# Patient Record
Sex: Male | Born: 1935 | Race: Black or African American | Hispanic: No | Marital: Single | Smoking: Former smoker
Health system: Southern US, Community
[De-identification: ages and names within clinical notes are randomized; demographics above are authoritative.]

## PROBLEM LIST (undated history)

## (undated) DIAGNOSIS — I639 Cerebral infarction, unspecified: Secondary | ICD-10-CM

## (undated) DIAGNOSIS — R0609 Other forms of dyspnea: Secondary | ICD-10-CM

## (undated) DIAGNOSIS — I739 Peripheral vascular disease, unspecified: Secondary | ICD-10-CM

## (undated) DIAGNOSIS — H269 Unspecified cataract: Secondary | ICD-10-CM

## (undated) DIAGNOSIS — I10 Essential (primary) hypertension: Secondary | ICD-10-CM

## (undated) DIAGNOSIS — N400 Enlarged prostate without lower urinary tract symptoms: Secondary | ICD-10-CM

## (undated) DIAGNOSIS — R55 Syncope and collapse: Secondary | ICD-10-CM

## (undated) DIAGNOSIS — I340 Nonrheumatic mitral (valve) insufficiency: Secondary | ICD-10-CM

## (undated) HISTORY — DX: Nonrheumatic mitral (valve) insufficiency: I34.0

## (undated) HISTORY — DX: Unspecified cataract: H26.9

## (undated) HISTORY — DX: Essential (primary) hypertension: I10

## (undated) HISTORY — DX: Peripheral vascular disease, unspecified: I73.9

## (undated) HISTORY — PX: PROSTATE SURGERY: SHX751

## (undated) HISTORY — DX: Other forms of dyspnea: R06.09

## (undated) HISTORY — PX: EYE SURGERY: SHX253

## (undated) HISTORY — DX: Benign prostatic hyperplasia without lower urinary tract symptoms: N40.0

---

## 1997-12-30 ENCOUNTER — Ambulatory Visit (HOSPITAL_COMMUNITY): Admission: RE | Admit: 1997-12-30 | Discharge: 1997-12-30 | Payer: Self-pay | Admitting: Specialist

## 1998-03-12 ENCOUNTER — Other Ambulatory Visit: Admission: RE | Admit: 1998-03-12 | Discharge: 1998-03-12 | Payer: Self-pay | Admitting: Urology

## 1999-03-31 ENCOUNTER — Other Ambulatory Visit: Admission: RE | Admit: 1999-03-31 | Discharge: 1999-03-31 | Payer: Self-pay | Admitting: Urology

## 2000-10-29 ENCOUNTER — Encounter (INDEPENDENT_AMBULATORY_CARE_PROVIDER_SITE_OTHER): Payer: Self-pay | Admitting: Specialist

## 2000-10-29 ENCOUNTER — Other Ambulatory Visit: Admission: RE | Admit: 2000-10-29 | Discharge: 2000-10-29 | Payer: Self-pay | Admitting: Urology

## 2006-09-17 ENCOUNTER — Encounter (INDEPENDENT_AMBULATORY_CARE_PROVIDER_SITE_OTHER): Payer: Self-pay | Admitting: Specialist

## 2006-09-17 ENCOUNTER — Inpatient Hospital Stay (HOSPITAL_COMMUNITY): Admission: RE | Admit: 2006-09-17 | Discharge: 2006-09-20 | Payer: Self-pay | Admitting: Urology

## 2009-12-04 ENCOUNTER — Emergency Department (HOSPITAL_COMMUNITY): Admission: EM | Admit: 2009-12-04 | Discharge: 2009-12-04 | Payer: Self-pay | Admitting: Emergency Medicine

## 2010-06-10 ENCOUNTER — Inpatient Hospital Stay (HOSPITAL_COMMUNITY)
Admission: RE | Admit: 2010-06-10 | Discharge: 2010-06-15 | Payer: Self-pay | Source: Home / Self Care | Attending: Internal Medicine | Admitting: Internal Medicine

## 2010-06-10 LAB — BUN: BUN: 14 mg/dL (ref 6–23)

## 2010-06-10 LAB — CREATININE, SERUM
Creatinine, Ser: 1.11 mg/dL (ref 0.4–1.5)
GFR calc Af Amer: >60 mL/min (ref >60)
GFR calc non Af Amer: >60 mL/min (ref >60)

## 2010-06-20 LAB — HEPATIC FUNCTION PANEL
ALT: 94 U/L — ABNORMAL HIGH (ref 0–53)
AST: 40 U/L — ABNORMAL HIGH (ref 0–37)
Albumin: 2.6 g/dL — ABNORMAL LOW (ref 3.5–5.2)
Alkaline Phosphatase: 394 U/L — ABNORMAL HIGH (ref 39–117)
Bilirubin, Direct: 2.2 mg/dL — ABNORMAL HIGH (ref 0.0–0.3)
Indirect Bilirubin: 1.5 mg/dL — ABNORMAL HIGH (ref 0.3–0.9)
Total Bilirubin: 3.7 mg/dL — ABNORMAL HIGH (ref 0.3–1.2)
Total Protein: 6 g/dL (ref 6.0–8.3)

## 2010-06-20 LAB — COMPREHENSIVE METABOLIC PANEL
ALT: 121 U/L — ABNORMAL HIGH (ref 0–53)
ALT: 68 U/L — ABNORMAL HIGH (ref 0–53)
ALT: 53 U/L (ref 0–53)
ALT: 40 U/L (ref 0–53)
AST: 57 U/L — ABNORMAL HIGH (ref 0–37)
AST: 25 U/L (ref 0–37)
AST: 21 U/L (ref 0–37)
AST: 18 U/L (ref 0–37)
Albumin: 2.9 g/dL — ABNORMAL LOW (ref 3.5–5.2)
Albumin: 2.4 g/dL — ABNORMAL LOW (ref 3.5–5.2)
Albumin: 2.3 g/dL — ABNORMAL LOW (ref 3.5–5.2)
Albumin: 2.3 g/dL — ABNORMAL LOW (ref 3.5–5.2)
Alkaline Phosphatase: 434 U/L — ABNORMAL HIGH (ref 39–117)
Alkaline Phosphatase: 312 U/L — ABNORMAL HIGH (ref 39–117)
Alkaline Phosphatase: 297 U/L — ABNORMAL HIGH (ref 39–117)
Alkaline Phosphatase: 257 U/L — ABNORMAL HIGH (ref 39–117)
BUN: 13 mg/dL (ref 6–23)
BUN: 13 mg/dL (ref 6–23)
BUN: 8 mg/dL (ref 6–23)
BUN: 7 mg/dL (ref 6–23)
CO2: 30 mEq/L (ref 19–32)
CO2: 27 mEq/L (ref 19–32)
CO2: 26 mEq/L (ref 19–32)
CO2: 26 mEq/L (ref 19–32)
Calcium: 8.9 mg/dL (ref 8.4–10.5)
Calcium: 8.3 mg/dL — ABNORMAL LOW (ref 8.4–10.5)
Calcium: 8.5 mg/dL (ref 8.4–10.5)
Calcium: 8.6 mg/dL (ref 8.4–10.5)
Chloride: 100 mEq/L (ref 96–112)
Chloride: 107 mEq/L (ref 96–112)
Chloride: 110 mEq/L (ref 96–112)
Chloride: 112 mEq/L (ref 96–112)
Creatinine, Ser: 0.94 mg/dL (ref 0.4–1.5)
Creatinine, Ser: 1.07 mg/dL (ref 0.4–1.5)
Creatinine, Ser: 1.02 mg/dL (ref 0.4–1.5)
Creatinine, Ser: 1.07 mg/dL (ref 0.4–1.5)
GFR calc Af Amer: >60 mL/min (ref >60)
GFR calc Af Amer: >60 mL/min (ref >60)
GFR calc Af Amer: >60 mL/min (ref >60)
GFR calc Af Amer: >60 mL/min (ref >60)
GFR calc non Af Amer: >60 mL/min (ref >60)
GFR calc non Af Amer: >60 mL/min (ref >60)
GFR calc non Af Amer: >60 mL/min (ref >60)
GFR calc non Af Amer: >60 mL/min (ref >60)
Glucose, Bld: 99 mg/dL (ref 70–99)
Glucose, Bld: 82 mg/dL (ref 70–99)
Glucose, Bld: 88 mg/dL (ref 70–99)
Glucose, Bld: 90 mg/dL (ref 70–99)
Potassium: 3.8 mEq/L (ref 3.5–5.1)
Potassium: 3.8 mEq/L (ref 3.5–5.1)
Potassium: 4 mEq/L (ref 3.5–5.1)
Potassium: 4 mEq/L (ref 3.5–5.1)
Sodium: 137 mEq/L (ref 135–145)
Sodium: 139 mEq/L (ref 135–145)
Sodium: 141 mEq/L (ref 135–145)
Sodium: 143 mEq/L (ref 135–145)
Total Bilirubin: 7.1 mg/dL — ABNORMAL HIGH (ref 0.3–1.2)
Total Bilirubin: 2.1 mg/dL — ABNORMAL HIGH (ref 0.3–1.2)
Total Bilirubin: 1.8 mg/dL — ABNORMAL HIGH (ref 0.3–1.2)
Total Bilirubin: 1.4 mg/dL — ABNORMAL HIGH (ref 0.3–1.2)
Total Protein: 6.4 g/dL (ref 6.0–8.3)
Total Protein: 5.8 g/dL — ABNORMAL LOW (ref 6.0–8.3)
Total Protein: 5.9 g/dL — ABNORMAL LOW (ref 6.0–8.3)
Total Protein: 5.8 g/dL — ABNORMAL LOW (ref 6.0–8.3)

## 2010-06-20 LAB — DIFFERENTIAL
Basophils Absolute: 0 10*3/uL (ref 0.0–0.1)
Basophils Absolute: 0 10*3/uL (ref 0.0–0.1)
Basophils Relative: 0 % (ref 0–1)
Basophils Relative: 0 % (ref 0–1)
Eosinophils Absolute: 0 10*3/uL (ref 0.0–0.7)
Eosinophils Absolute: 0 10*3/uL (ref 0.0–0.7)
Eosinophils Relative: 0 % (ref 0–5)
Eosinophils Relative: 0 % (ref 0–5)
Lymphocytes Relative: 12 % (ref 12–46)
Lymphocytes Relative: 15 % (ref 12–46)
Lymphs Abs: 1.2 10*3/uL (ref 0.7–4.0)
Lymphs Abs: 1.1 10*3/uL (ref 0.7–4.0)
Monocytes Absolute: 1.2 10*3/uL — ABNORMAL HIGH (ref 0.1–1.0)
Monocytes Absolute: 1 10*3/uL (ref 0.1–1.0)
Monocytes Relative: 12 % (ref 3–12)
Monocytes Relative: 13 % — ABNORMAL HIGH (ref 3–12)
Neutro Abs: 7.6 10*3/uL (ref 1.7–7.7)
Neutro Abs: 5.6 10*3/uL (ref 1.7–7.7)
Neutrophils Relative %: 76 % (ref 43–77)
Neutrophils Relative %: 72 % (ref 43–77)

## 2010-06-20 LAB — URINALYSIS, ROUTINE W REFLEX MICROSCOPIC
Ketones, ur: NEGATIVE mg/dL
Leukocytes, UA: NEGATIVE
Nitrite: NEGATIVE
Protein, ur: 30 mg/dL — AB
Specific Gravity, Urine: 1.045 — ABNORMAL HIGH (ref 1.005–1.030)
Urine Glucose, Fasting: NEGATIVE mg/dL
Urobilinogen, UA: 1 mg/dL (ref 0.0–1.0)
pH: 7.5 (ref 5.0–8.0)

## 2010-06-20 LAB — LIPID PANEL
Cholesterol: 147 mg/dL (ref 0–200)
HDL: 22 mg/dL — ABNORMAL LOW (ref >39)
LDL Cholesterol: 95 mg/dL (ref 0–99)
Total CHOL/HDL Ratio: 6.7 RATIO
Triglycerides: 148 mg/dL (ref <150)
VLDL: 30 mg/dL (ref 0–40)

## 2010-06-20 LAB — CANCER ANTIGEN 19-9: CA 19-9: 422.5 U/mL — ABNORMAL HIGH (ref <35.0)

## 2010-06-20 LAB — CBC
HCT: 41.3 % (ref 39.0–52.0)
HCT: 42.6 % (ref 39.0–52.0)
HCT: 41.2 % (ref 39.0–52.0)
Hemoglobin: 13.7 g/dL (ref 13.0–17.0)
Hemoglobin: 14.2 g/dL (ref 13.0–17.0)
Hemoglobin: 13.4 g/dL (ref 13.0–17.0)
MCH: 30.5 pg (ref 26.0–34.0)
MCH: 30.7 pg (ref 26.0–34.0)
MCH: 29.8 pg (ref 26.0–34.0)
MCHC: 33.2 g/dL (ref 30.0–36.0)
MCHC: 33.3 g/dL (ref 30.0–36.0)
MCHC: 32.5 g/dL (ref 30.0–36.0)
MCV: 92 fL (ref 78.0–100.0)
MCV: 92 fL (ref 78.0–100.0)
MCV: 91.6 fL (ref 78.0–100.0)
Platelets: 359 10*3/uL (ref 150–400)
Platelets: 369 10*3/uL (ref 150–400)
Platelets: 374 10*3/uL (ref 150–400)
RBC: 4.49 MIL/uL (ref 4.22–5.81)
RBC: 4.63 MIL/uL (ref 4.22–5.81)
RBC: 4.5 MIL/uL (ref 4.22–5.81)
RDW: 14.5 % (ref 11.5–15.5)
RDW: 14.5 % (ref 11.5–15.5)
RDW: 14.2 % (ref 11.5–15.5)
WBC: 10 10*3/uL (ref 4.0–10.5)
WBC: 7.8 10*3/uL (ref 4.0–10.5)
WBC: 4.7 10*3/uL (ref 4.0–10.5)

## 2010-06-20 LAB — BASIC METABOLIC PANEL
BUN: 15 mg/dL (ref 6–23)
CO2: 29 mEq/L (ref 19–32)
Calcium: 8.8 mg/dL (ref 8.4–10.5)
Chloride: 102 mEq/L (ref 96–112)
Creatinine, Ser: 1.09 mg/dL (ref 0.4–1.5)
GFR calc Af Amer: >60 mL/min (ref >60)
GFR calc non Af Amer: >60 mL/min (ref >60)
Glucose, Bld: 84 mg/dL (ref 70–99)
Potassium: 4 mEq/L (ref 3.5–5.1)
Sodium: 139 mEq/L (ref 135–145)

## 2010-06-20 LAB — URINE MICROSCOPIC-ADD ON

## 2010-06-20 LAB — TSH: TSH: 3.321 u[IU]/mL (ref 0.350–4.500)

## 2010-06-20 LAB — LIPASE, BLOOD
Lipase: 573 U/L — ABNORMAL HIGH (ref 11–59)
Lipase: 436 U/L — ABNORMAL HIGH (ref 11–59)
Lipase: 182 U/L — ABNORMAL HIGH (ref 11–59)
Lipase: 135 U/L — ABNORMAL HIGH (ref 11–59)
Lipase: 110 U/L — ABNORMAL HIGH (ref 11–59)

## 2010-06-20 LAB — AFP TUMOR MARKER: AFP-Tumor Marker: 5.8 ng/mL (ref 0.0–8.0)

## 2010-07-02 NOTE — Discharge Summary (Signed)
[name], [name]                ACCOUNT NO.:  [id number]  MEDICAL RECORD NO.:  [id number]          PATIENT TYPE:  INP  LOCATION:  1303                         FACILITY:  [facility]  PHYSICIAN:  Calvert Cantor, M.D.     DATE OF BIRTH:  1929-07-28  DATE OF ADMISSION:  06/10/2010 DATE OF DISCHARGE:  06/15/2010                              DISCHARGE SUMMARY   PRIMARY CARE PHYSICIAN:  Brett Canales A. Cleta Alberts, M.D.  PRESENTING COMPLAINT:  Abdominal pain.  DISCHARGE DIAGNOSES: 1. Acute pancreatitis secondary to choledocholithiasis. 2. Acute calculous cholecystitis. 3. Hypertension. 4. Benign prostatic hypertrophy.  DISCHARGE MEDICATIONS: 1. Augmentin 875/125 mg 1 tablet b.i.d. for 14 days as recommended by     Surgery. 2. Amlodipine 10 mg daily. 3. Metoprolol 12.5 mg b.i.d.  HOSPITAL COURSE:  This is a 77 year old male who presented to the ER with a complaint of epigastric pain.  The patient was found to have acute pancreatitis, thought to be secondary to choledocholithiasis.  He also had a CT, which revealed cholecystitis.  The patient was started on IV antibiotics and admitted for conservative management.  GI consult and surgery consult were requested.  The patient underwent an EUS, which did not reveal any gallstones, although the CBD was dilated. It was suspected that the patient had passed the gallstone.  Surgery consult was also requested.  The patient was evaluated by Dr. Abbey Chatters who is recommending p.o. antibiotics for now.  He will follow up with the patient in 2 weeks post discharge for an elective cholecystectomy.  The patient's diet has been advanced and he is tolerating a low-fat, low- cholesterol diet.  In addition, he has been advised to take a low-sodium diet due to his diagnosis of hypertension.  On physical exam, abdomen is soft, nontender, nondistended.  Bowel sounds are positive.  Lungs are clear bilaterally.  Heart regular rate and rhythm.  No murmurs.  The  patient is advised to follow up with Surgery within 2 weeks and schedule an appointment for elective cholecystectomy.  The patient is advised to follow up with Dr. Cleta Alberts by the end of this week to monitor his blood pressure to ensure that it is being controlled well with his current medications.  These are new medications for him. He was not being treated in the past for his hypertension.  PERTINENT LABORATORY RESULTS:  Total bilirubin today is 1.4, which has improved from 7.1 on admission.  Alkaline phosphatase is 257, which is improved from 434 on admission.  AST and ALT were elevated at 57 and 121 on January 6th, which is his admitting date.  These have now normalized. Lipase has improved to 110.  A lipid profile was done.  He was found to have an HDL, which was low at 22.  Alpha-fetoprotein was ordered and found to be within normal limits at 5.8.  CA99 is 422.5.  PERTINENT RADIOLOGICAL IMAGING: 1. CT abdomen and pelvis performed on June 10, 2010, revealed     cholelithiasis and CT evidence of acute cholecystitis. 2. 3.6 cm lateral segment left lobe liver hemangioma and probable     additional 6 mm left lobe  liver hemangioma. 3. Bilateral inguinal hernia containing fat, larger on the left.  Ultrasound on the abdomen performed on June 11, 2010, revealed findings consistent with acute calculus cholecystitis, increased caliber of the common bile duct, and left hepatic lobe echogenic structure, which is likely hemangioma.  Time on discharge 60 minutes.     Calvert Cantor, M.D.     SR/MEDQ  D:  06/15/2010  T:  06/15/2010  Job:  [id number]  cc:   Brett Canales A. Cleta Alberts, M.D. Fax: [phone]  Electronically Signed by Calvert Cantor M.D. on 07/02/2010 02:36:09 PM

## 2010-07-04 ENCOUNTER — Ambulatory Visit (HOSPITAL_COMMUNITY)
Admission: RE | Admit: 2010-07-04 | Discharge: 2010-07-05 | Payer: Self-pay | Source: Home / Self Care | Attending: Surgery | Admitting: Surgery

## 2010-07-04 HISTORY — PX: CHOLECYSTECTOMY: SHX55

## 2010-07-04 LAB — CBC
HCT: 43.5 % (ref 39.0–52.0)
Hemoglobin: 14.6 g/dL (ref 13.0–17.0)
MCV: 87.2 fL (ref 78.0–100.0)
RBC: 4.99 MIL/uL (ref 4.22–5.81)
RDW: 13.7 % (ref 11.5–15.5)
WBC: 5.9 10*3/uL (ref 4.0–10.5)

## 2010-07-04 LAB — DIFFERENTIAL
Basophils Absolute: 0 10*3/uL (ref 0.0–0.1)
Eosinophils Relative: 1 % (ref 0–5)
Lymphocytes Relative: 47 % — ABNORMAL HIGH (ref 12–46)
Lymphs Abs: 2.7 10*3/uL (ref 0.7–4.0)
Neutro Abs: 2.6 10*3/uL (ref 1.7–7.7)
Neutrophils Relative %: 44 % (ref 43–77)

## 2010-07-04 LAB — COMPREHENSIVE METABOLIC PANEL
ALT: 24 U/L (ref 0–53)
Albumin: 3.3 g/dL — ABNORMAL LOW (ref 3.5–5.2)
Alkaline Phosphatase: 133 U/L — ABNORMAL HIGH (ref 39–117)
Chloride: 102 mEq/L (ref 96–112)
Glucose, Bld: 88 mg/dL (ref 70–99)
Potassium: 3.8 mEq/L (ref 3.5–5.1)
Sodium: 141 mEq/L (ref 135–145)
Total Bilirubin: 0.7 mg/dL (ref 0.3–1.2)
Total Protein: 6.3 g/dL (ref 6.0–8.3)

## 2010-07-04 LAB — URINALYSIS, ROUTINE W REFLEX MICROSCOPIC
Bilirubin Urine: NEGATIVE
Hgb urine dipstick: NEGATIVE
Specific Gravity, Urine: 1.021 (ref 1.005–1.030)
Urobilinogen, UA: 0.2 mg/dL (ref 0.0–1.0)
pH: 6 (ref 5.0–8.0)

## 2010-07-05 LAB — HEPATIC FUNCTION PANEL
ALT: 27 U/L (ref 0–53)
AST: 27 U/L (ref 0–37)
Bilirubin, Direct: 0.3 mg/dL (ref 0.0–0.3)
Indirect Bilirubin: 0.6 mg/dL (ref 0.3–0.9)
Total Bilirubin: 0.9 mg/dL (ref 0.3–1.2)

## 2010-07-05 LAB — CBC
HCT: 37.2 % — ABNORMAL LOW (ref 39.0–52.0)
MCH: 29.1 pg (ref 26.0–34.0)
MCV: 88.2 fL (ref 78.0–100.0)
RBC: 4.22 MIL/uL (ref 4.22–5.81)
WBC: 10.4 10*3/uL (ref 4.0–10.5)

## 2010-07-07 NOTE — Discharge Summary (Signed)
  [name], [name]                ACCOUNT NO.:  [id number]  MEDICAL RECORD NO.:  [id number]          PATIENT TYPE:  OIB  LOCATION:  5127                         FACILITY:  [facility]  PHYSICIAN:  Currie Paris, M.D.DATE OF BIRTH:  13-Oct-1929  DATE OF ADMISSION:  07/04/2010 DATE OF DISCHARGE:  07/05/2010                              DISCHARGE SUMMARY   FINAL DIAGNOSES:  Chronic calculous cholecystitis and possible choledocholithiasis.  CLINICAL HISTORY:  Mr. Zeiders is a 77 year old gentleman who was admitted last month with an episode of biliary pancreatitis which resolved.  He was admitted electively for laparoscopic cholecystectomy and cholangiogram.  HOSPITAL COURSE:  The patient was admitted and taken to the operating room where he underwent successful laparoscopic cholecystectomy with operative cholangiogram.  To both me and to Dr. Elnoria Howard, the cholangiogram appeared to show some stones, but he did have good filling of the distal duct, and the radiologist thought that perhaps these cleared on a subsequent film.  The patient did well after surgery, having minimal pain.  Liver functions, the day following surgery, were basically normal with his alkaline phosphatase having returned to normal from slightly elevated preoperatively.  The patient is discharged in satisfactory condition, to resume his usual medications.  He is given a pain prescription for Tylox.  He is to follow up in my office in approximately 2 weeks and also to follow up with Dr. Elnoria Howard for possible ERCP.  Of note is the pathology report is pending at the time of this dictation.     Currie Paris, M.D.     CJS/MEDQ  D:  07/05/2010  T:  07/06/2010  Job:  [id number]  cc:   Brett Canales A. Cleta Alberts, M.D. Jordan Hawks Elnoria Howard, MD  Electronically Signed by Cyndia Bent M.D. on 07/07/2010 12:27:51 PM

## 2010-07-07 NOTE — Op Note (Signed)
NAMEBERNERD, TERHUNE                ACCOUNT NO.:  [id number]  MEDICAL RECORD NO.:  [id number]          PATIENT TYPE:  OIB  LOCATION:  5127                         FACILITY:  [facility]  PHYSICIAN:  Currie Paris, M.D.DATE OF BIRTH:  July 01, 1929  DATE OF PROCEDURE:  07/04/2010 DATE OF DISCHARGE:                              OPERATIVE REPORT   PREOPERATIVE DIAGNOSIS:  Chronic calculus cholecystitis with recent episode of biliary pancreatitis.  POSTOPERATIVE DIAGNOSIS:  Chronic calculus cholecystitis with recent episode of biliary pancreatitis plus choledocholithiasis.  PROCEDURE:  Laparoscopic cholecystectomy with operative cholangiogram.  SURGEON:  Currie Paris, MD  ASSISTANT:  Gabrielle Dare. Janee Morn, MD  ANESTHESIA:  General endotracheal.  CLINICAL HISTORY:  This is a 77 year old gentleman recently hospitalized with a bout of acute biliary pancreatitis.  His liver functions have normalized at discharge, and an endoscopic ultrasound showed no evidence of stones in the common duct.  We waited for a few weeks for his episodes to cool down and then brought him in for an elective cholecystectomy with plans for cholangiogram.  DESCRIPTION OF PROCEDURE:  I saw the patient in the holding area and he had no further questions.  We confirmed the plans as noted above.  His alk phos was the only LFT that was slightly elevated preoperatively.  The patient was taken to the operating room, and after satisfactory general endotracheal anesthesia had been obtained the abdomen was prepped and draped and the time-out was done.  I used 0.25% plain Marcaine for each incision.  The umbilical incision was made.  The fascia identified and opened, and the peritoneal cavity entered.  A Hasson was placed.  The abdomen insufflated to 15.  With the patient in reverse Trendelenburg and tilted to the left, a 10/11 trocar was placed in the epigastrium and 2/5 laterally.  The gallbladder appeared  fairly normal with no evidence of acute inflammation.  The common duct was visible and notably dilated.  The gallbladder was retracted over the liver and the perineum over the cystic duct opened and I could identify and dissect out a nice long segment of a large in diameter cystic duct as well as the cystic artery. I had a nice window behind them confirming critical view and could see the junction of the cystic duct with both the gallbladder and the common duct.  A clip was placed on the artery and one on the duct at the junction with the gallbladder.  Cystic duct was opened and a Cook catheter placed percutaneously and cholangiogram done.  This showed a markedly dilated duct with a little delay of contrast, but we did see what looked like some 3 stones in the distal duct.  This was filling with the liver radicals on both right and left main ducts.  The catheter was removed, and a Reddick catheter placed in an attempt to pass this into the common duct and sweep it for stones, but I was unable to get past the cystic common duct junction and I elected to stop after several attempts to avoid injury to the back wall of the cystic duct or common duct in attempting to  do this.  Four clips were placed on the stay side of the cystic duct and it was divided.  Two additional clips were placed on the cystic artery and it was divided leaving two clips on the stay side, and the gallbladder was removed from below to above.  There is a fairly raw surface of the liver because it was not a good plane of dissection, and after the gallbladder was removed and placed in a bag I started irrigating using the cautery. I made sure there everything was dry and placed a piece of SNoW in for hemostasis.  The gallbladder was brought out the umbilical port and we reinserted that and did a final check for hemostasis, and while we had been removing the gallbladder and irrigating there had been no further bleeding  from the bed of the gallbladder.  I did put another piece of SNoW in just to be sure we had everything covered.  The abdomen was deflated and the lateral ports removed.  The umbilical site was closed with a pursestring.  The abdomen was deflated through the epigastric port and skin closed with 4-0 Monocryl subcuticular plus Dermabond.  The patient tolerated the procedure well, and there were no complications.  All counts were correct.     Currie Paris, M.D.     CJS/MEDQ  D:  07/04/2010  T:  07/05/2010  Job:  [id number]  cc:   Jordan Hawks. Elnoria Howard, MD Stan Head Cleta Alberts, M.D.  Electronically Signed by Cyndia Bent M.D. on 07/07/2010 12:27:41 PM

## 2010-08-21 LAB — URINE CULTURE: Colony Count: NO GROWTH

## 2010-08-21 LAB — URINALYSIS, ROUTINE W REFLEX MICROSCOPIC
Bilirubin Urine: NEGATIVE
Glucose, UA: NEGATIVE mg/dL
Specific Gravity, Urine: 1.02 (ref 1.005–1.030)
pH: 6.5 (ref 5.0–8.0)

## 2010-08-21 LAB — URINE MICROSCOPIC-ADD ON

## 2010-10-21 NOTE — Op Note (Signed)
NAMETRAVEN, DAVIDS                ACCOUNT NO.:  [id number]   MEDICAL RECORD NO.:  [id number]          PATIENT TYPE:  INP   LOCATION:  0009                         FACILITY:  [facility]   PHYSICIAN:  Lindaann Slough, M.D.  DATE OF BIRTH:  1930/02/01   DATE OF PROCEDURE:  09/17/2006  DATE OF DISCHARGE:                               OPERATIVE REPORT   PREOPERATIVE DIAGNOSIS:  Urinary retention, benign prostatic  hyperplasia.   POSTOPERATIVE DIAGNOSIS:  Urinary retention, benign prostatic  hyperplasia.   PROCEDURE:  Cystoscopy and transurethral resection of the prostate  (TURP).   SURGEON:  Dr. Wendie Simmer Nesi.   ANESTHESIA:  General.   INDICATION:  The patient is 77 year old male who went into urinary  retention about two weeks ago.  He had been on Flomax and he failed two  voiding trials.  Cystoscopy showed trilobar prostatic hypertrophy.  He  is admitted today for TURP.   DESCRIPTION OF PROCEDURE:  Under general anesthesia, the patient was  prepped and draped and placed in the dorsal lithotomy position.  A #22  Wappler cystoscope was inserted in the bladder.  The anterior urethra is  normal.  There is trilobar prostatic hypertrophy with obstruction of the  bladder neck.  The bladder is trabeculated with a diverticulum at the  dome and posterior wall of the bladder.  The ureteral orifices are in  normal position and shape with clear efflux.  The cystoscope was then  removed.  The urethra was dilated up to #30-French with Sissy Hoff  sounds.  Then, a #28 continuous-flow resectoscope was inserted in the  bladder.  Resection of the prostate then was done between the 7 and 10  o'clock position, in between the 2 and the 5 o'clock positions using the  bladder neck, and the verumontanum  as landmarks.  Resection was then  completed between the 10 and the 2 o'clock positions, and between the 5  and the 7 o'clock positions using the same landmarks.  Hemostasis was  secured with electrocautery.   The prostatic chips chips were then  irrigated out of the bladder.  There was minimal bleeding at the end of  the procedure.  The resectoscope was then removed.  A #24 Simplastic  catheter was then inserted in the bladder.   The patient tolerated the procedure well and left the OR in satisfactory  condition to post anesthesia care unit.      Lindaann Slough, M.D.  Electronically Signed     MN/MEDQ  D:  09/17/2006  T:  09/17/2006  Job:  [id number]

## 2010-10-21 NOTE — H&P (Signed)
NAME:  [name], [name]                ACCOUNT NO.:  [id number]   MEDICAL RECORD NO.:  [id number]          PATIENT TYPE:  INP   LOCATION:  0009                         FACILITY:  [facility]   PHYSICIAN:  Lindaann Slough, M.D.  DATE OF BIRTH:  27-Jun-1929   DATE OF ADMISSION:  09/17/2006  DATE OF DISCHARGE:                              HISTORY & PHYSICAL   CHIEF COMPLAINT:  Inability to urinate.   HISTORY OF PRESENT ILLNESS:  The patient is a 77 year old male who was  seen in the office on 08/31/2006 in urinary retention.  He was  catheterized for 1400 mL of urine.  He failed a voiding trial 2 days  later.  Cystoscopy showed  trilobar prostatic hypertrophy.  After  cystoscopy, he was still unable to urinate and a Foley catheter was then  left indwelling after draining 750 mL of urine out of the bladder.  He  had a TUNA procedure in November 2002 and has a history of elevated PSA  with five negative biopsies in the past.  He is now admitted for TURP.   PAST MEDICAL HISTORY: positive for hypertension.   PAST SURGICAL HISTORY:  He had inguinal hernia repair in the past.   MEDICATIONS:  He had been on Flomax.   ALLERGIES:  NO KNOWN DRUG ALLERGIES.   SOCIAL HISTORY:  He is single, does not have any children.  He quit  smoking about 20 years ago and had smoked for over 18 years.  He does  not drink.   FAMILY HISTORY:  positive for hypertension.   REVIEW OF SYSTEMS:  Positive for skin itching.  He is hard of hearing  and he has shortness of breath at times and dizziness and as noted in  the HPI, everything else is negative.   PHYSICAL EXAMINATION:  GENERAL:  This is a well-developed 77 year old  male who is in no acute distress.  He is alert and oriented.  VITAL SIGNS:  Blood pressure is 143/80, pulse 62, respirations 16,  temperature 97.9.  HEENT:  The head is normal.  Pupils are equal and reactive to light and  accommodation.  Ears, nose and throat are within normal limits.  NECK:   Supple.  No cervical lymph nodes or thyromegaly.  CHEST: Symmetrical.  Lungs are fully expanded and clear to percussion  and auscultation.  HEART:  Regular rate and rhythm, no murmurs or gallops.  ABDOMEN:  Soft, nondistended, nontender.  He has no CVA tenderness.  Kidneys are not palpable.  Bladder is not distended.  He has no inguinal  hernia.  Bowel sounds normal.  The penis and meatus are normal.  He has  a Foley catheter that is draining clear urine.  Scrotal contents are  within normal limits.  RECTAL:  On rectal examination, sphincter tone is normal, prostate is  enlarged 60 grams, no nodules, seminal vesicles not palpable.   IMPRESSION:  Urinary retention, benign prostatic hypertrophy and history  of elevated PSA and hypertension.      Lindaann Slough, M.D.  Electronically Signed     MN/MEDQ  D:  09/17/2006  T:  09/17/2006  Job:  [id number]

## 2010-10-21 NOTE — Discharge Summary (Signed)
[name], [name]                ACCOUNT NO.:  [id number]   MEDICAL RECORD NO.:  [id number]          PATIENT TYPE:  INP   LOCATION:  1424                         FACILITY:  [facility]   PHYSICIAN:  Lindaann Slough, M.D.  DATE OF BIRTH:  January 07, 1930   DATE OF ADMISSION:  09/17/2006  DATE OF DISCHARGE:  09/19/2006                               DISCHARGE SUMMARY   DISCHARGE DIAGNOSIS:  1. Urinary retention.  2. Benign prostatic hypertrophy.  3. Hypertension   PROCEDURE PERFORMED:  Cystoscopy and transurethral resection of the  prostate on 09/17/2006.   HOSPITAL COURSE:  The patient is 77 year old male who went into urinary  retention on 08/31/2006.  He was catheterized for 1400 mL of urine; and  he failed two voiding trials.  Cystoscopy showed trilobar prostatic  hypertrophy.  He had a TUNA procedure in November 2002; and has a  history of elevated PSA with five negative biopsies.  He was admitted on  April 14 for TURP.   PHYSICAL EXAMINATION:  VITAL SIGNS:  Blood pressure was 143/80, pulse  62, respirations 16, temperature 97.9.  LUNGS: Clear.  HEART: Regular  rate and rhythm.  ABDOMEN:  Soft and nondistended, nontender.  No CVA  tenderness.  Kidneys not palpable.  No inguinal hernia.  Bowel sounds  normal.  GENITALIA:  Penis is uncircumcised.  Meatus is normal.  Scrotum  is normal.  RECTAL EXAMINATION:  Sphincter tone is normal.  Prostate is  enlarged 60 grams without any nodules and seminal vesicles not palpable.   LABORATORY DATA:  Hemoglobin on admission was 14, hematocrit 41.5, WBC  5.9.  BUN 9, creatinine 0.87, sodium 141, potassium 4.0, glucose 102.  Urine culture showed dipthteroids  Chest x-Ray showed no evidence of  active disease, and shrapnel in the left chest wall.  EKG showed  nonspecific ST-T changes.   The patient had cystoscopy and TURP on 09/17/2006.  Postop course was  uneventful.  He remained afebrile.  He tolerated his diet well.  The  Foley catheter was  removed on 09/19/2006.  By 8:30 he had not voided  yet, but his bladder was not distended.  Pathology report showed BPH.   The plan is to discharge the patient home, today, after voiding.  If he  is unable to void, he will be discharged home with a Foley catheter.   CONDITION ON DISCHARGE:  Improved.   DISCHARGE DIET:  Regular   The patient is instructed not to do any lifting, straining, or driving  until further advised.  He is discharged home on Cipro 500 mg twice a  day; and he will be followed in the office in about 3 weeks.      Lindaann Slough, M.D.  Electronically Signed     MN/MEDQ  D:  09/19/2006  T:  09/19/2006  Job:  [id number]

## 2011-10-28 ENCOUNTER — Ambulatory Visit (INDEPENDENT_AMBULATORY_CARE_PROVIDER_SITE_OTHER): Payer: Medicare Other | Admitting: Family Medicine

## 2011-10-28 ENCOUNTER — Encounter: Payer: Self-pay | Admitting: Family Medicine

## 2011-10-28 VITALS — BP 147/82 | HR 79 | Temp 98.1°F | Resp 16 | Ht 64.0 in | Wt 173.2 lb

## 2011-10-28 DIAGNOSIS — I10 Essential (primary) hypertension: Secondary | ICD-10-CM

## 2011-10-28 DIAGNOSIS — R222 Localized swelling, mass and lump, trunk: Secondary | ICD-10-CM

## 2011-10-28 DIAGNOSIS — R1900 Intra-abdominal and pelvic swelling, mass and lump, unspecified site: Secondary | ICD-10-CM

## 2011-10-28 DIAGNOSIS — J310 Chronic rhinitis: Secondary | ICD-10-CM

## 2011-10-28 DIAGNOSIS — R6889 Other general symptoms and signs: Secondary | ICD-10-CM

## 2011-10-28 LAB — COMPREHENSIVE METABOLIC PANEL
ALT: 11 U/L (ref 0–53)
Alkaline Phosphatase: 104 U/L (ref 39–117)
CO2: 24 mEq/L (ref 19–32)
Sodium: 140 mEq/L (ref 135–145)
Total Bilirubin: 0.5 mg/dL (ref 0.3–1.2)
Total Protein: 7.2 g/dL (ref 6.0–8.3)

## 2011-10-28 LAB — POCT CBC
Granulocyte percent: 50.6 %G (ref 37–80)
Hemoglobin: 15.3 g/dL (ref 14.1–18.1)
Lymph, poc: 2.7 (ref 0.6–3.4)
MCHC: 32.6 g/dL (ref 31.8–35.4)
MPV: 8.9 fL (ref 0–99.8)
POC Granulocyte: 3.4 (ref 2–6.9)
POC MID %: 9.1 %M (ref 0–12)

## 2011-10-28 LAB — LIPID PANEL
Cholesterol: 150 mg/dL (ref 0–200)
LDL Cholesterol: 90 mg/dL (ref 0–99)
VLDL: 26 mg/dL (ref 0–40)

## 2011-10-28 LAB — POCT GLYCOSYLATED HEMOGLOBIN (HGB A1C): Hemoglobin A1C: 5.1

## 2011-10-28 MED ORDER — FLUTICASONE PROPIONATE 50 MCG/ACT NA SUSP: 2.0000 | Freq: Every day | NASAL | Status: DC

## 2011-10-28 NOTE — Progress Notes (Signed)
Subjective: 78 year old Afro-American male who comes in for a checkup. His insurance company see him in a home health worker to check him out recently, and he has a check list with a number of questions. I reviewed those with him. I do not believe that his blood pressure is at the level that I need to put him on medication at this time, but we will keep him on it. He does not need a colonoscopy at his age, as the risk exceeds the benefit. He is very careful about his diet. He is pretty sedentary in his lifestyle he goes outpatient some. He is planning to get an exercise bike. He does remain alert and oriented. He really feels well and brief review of systems is essentially normal. No cardiovascular, restart her, GI, or GU symptoms. Rhinorrhea sx.  Objective: TMs have moderate cerumen in both I can see a little bit of both eardrums. He is nose has been having rhinorrhea. Throat clear. Neck supple without significant nodes. Chest is clear to auscultation. Heart regular without murmurs gallops arrhythmias. Abdomen soft. There is a right epigastric lipoma about 5 cm in diameter, freely mobile. Extremities without edema.  Assessment: Borderline hypertension Lipoma abdominal wall Cold intolerance Rhinitis  Plan: debrox Flonase for his nose Increase exercise and watch his weight. Otherwise he really does not need to change anything as he is making good decisions in life are ready.

## 2011-10-28 NOTE — Patient Instructions (Addendum)
USE DEBROX EAR DROPS AS DIRECTED ON THE BOX TO TRY TO GET WAX OUT--FOLLOW DIRECTIONS ON BOX  Use the nose spray twice daily for 3 days, then once daily

## 2011-10-29 ENCOUNTER — Encounter: Payer: Self-pay | Admitting: Family Medicine

## 2012-04-14 ENCOUNTER — Ambulatory Visit (INDEPENDENT_AMBULATORY_CARE_PROVIDER_SITE_OTHER): Payer: Medicare Other | Admitting: Emergency Medicine

## 2012-04-14 VITALS — BP 151/79 | HR 78 | Temp 98.4°F | Resp 16 | Ht 65.0 in | Wt 181.0 lb

## 2012-04-14 DIAGNOSIS — J029 Acute pharyngitis, unspecified: Secondary | ICD-10-CM

## 2012-04-14 NOTE — Progress Notes (Signed)
[facility] [address] [place] [address] [id number]  Date:  04/14/2012   Name:  [name]   DOB:  06-24-1929   MRN:  [id number]  PCP:  No primary provider on file.    Chief Complaint: Sore Throat   History of Present Illness:  [name] is a 78 y.o. very pleasant male patient who presents with the following:  Has a very slight burning pain in his throat.  Thought he better get it checked "at his age".  No difficulty swallowing or eating.  No fever or chills or nausea or vomiting.  No nausea or vomiting.  Started three days ago  No cough, wheezing or shortness of breath.  Nasal congestion and clear nasal drainage.  No orthopnea or PND or peripheral edema.  No heartburn or waterbrash  There is no problem list on file for this patient.   No past medical history on file.  No past surgical history on file.  History  Substance Use Topics  . Smoking status: Former Games developer  . Smokeless tobacco: Not on file  . Alcohol Use: Not on file    No family history on file.  No Known Allergies  Medication list has been reviewed and updated.  Current Outpatient Prescriptions on File Prior to Visit  Medication Sig Dispense Refill  . fluticasone (FLONASE) 50 MCG/ACT nasal spray Place 2 sprays into the nose daily.  16 g  1    Review of Systems:  As per HPI, otherwise negative.    Physical Examination: Filed Vitals:   04/14/12 1814  BP: 151/79  Pulse: 78  Temp: 98.4 F (36.9 C)  Resp: 16   Filed Vitals:   04/14/12 1814  Height: 5\' 5"  (1.651 m)  Weight: 181 lb (82.101 kg)   Body mass index is 30.12 kg/(m^2). Ideal Body Weight: Weight in (lb) to have BMI = 25: 149.9   GEN: WDWN, NAD, Non-toxic, A & O x 3  No rash or sepsis or shortness of breath HEENT: Atraumatic, Normocephalic. Neck supple. No masses, No LAD.  Oropharynx negative Ears and Nose: No external deformity.  Bilateral cerumen excess CV: RRR, No M/G/R. No JVD. No thrill. No  extra heart sounds. PULM: CTA B, no wheezes, crackles, rhonchi. No retractions. No resp. distress. No accessory muscle use. ABD: S, NT, ND, +BS. No rebound. No HSM. EXTR: No c/c/e NEURO Normal gait.  PSYCH: Normally interactive. Conversant. Not depressed or anxious appearing.  Calm demeanor.    Assessment and Plan: Pharyngitis Follow up as needed  Carmelina Dane, MD

## 2012-04-21 NOTE — Progress Notes (Signed)
Reviewed and agree.

## 2012-10-17 ENCOUNTER — Ambulatory Visit (INDEPENDENT_AMBULATORY_CARE_PROVIDER_SITE_OTHER): Payer: Medicare Other | Admitting: Family Medicine

## 2012-10-17 ENCOUNTER — Encounter: Payer: Self-pay | Admitting: Family Medicine

## 2012-10-17 ENCOUNTER — Ambulatory Visit: Payer: Medicare Other

## 2012-10-17 VITALS — BP 164/89 | HR 77 | Temp 97.6°F | Resp 17 | Wt 170.0 lb

## 2012-10-17 DIAGNOSIS — R197 Diarrhea, unspecified: Secondary | ICD-10-CM

## 2012-10-17 DIAGNOSIS — R7989 Other specified abnormal findings of blood chemistry: Secondary | ICD-10-CM

## 2012-10-17 DIAGNOSIS — R109 Unspecified abdominal pain: Secondary | ICD-10-CM

## 2012-10-17 DIAGNOSIS — R011 Cardiac murmur, unspecified: Secondary | ICD-10-CM

## 2012-10-17 LAB — COMPREHENSIVE METABOLIC PANEL
BUN: 14 mg/dL (ref 6–23)
CO2: 28 mEq/L (ref 19–32)
Calcium: 9.4 mg/dL (ref 8.4–10.5)
Chloride: 103 mEq/L (ref 96–112)
Creat: 0.89 mg/dL (ref 0.50–1.35)

## 2012-10-17 LAB — POCT URINALYSIS DIPSTICK
Glucose, UA: NEGATIVE
Ketones, UA: NEGATIVE
Leukocytes, UA: NEGATIVE
Protein, UA: 30

## 2012-10-17 LAB — CBC
HCT: 43 % (ref 39.0–52.0)
Hemoglobin: 14.4 g/dL (ref 13.0–17.0)
RBC: 4.89 MIL/uL (ref 4.22–5.81)
WBC: 5.2 10*3/uL (ref 4.0–10.5)

## 2012-10-17 LAB — IFOBT (OCCULT BLOOD): IFOBT: NEGATIVE

## 2012-10-17 NOTE — Patient Instructions (Addendum)
I would like to see you tomorrow to go over the labs and xray. Stay on a liquid/ bland diet until I have seen you tomorrow. If you feel worse overnight, it is best for you to go to the hospital emergency department.

## 2012-10-17 NOTE — Progress Notes (Signed)
Subjective:    Patient ID: [name], male    DOB: Dec 12, 1929, 79 y.o.   MRN: [id number]  HPI This 79 y.o AA male c/o abd pain and cramping w/ n/v, cold chills (w/o fever) and loose stools.  This has been intermittent since gallbladder removal in Jan 2012. The most recent episode was  after eating some tomatoes. He had an episode after eating some oyster crackers. He reports  this being the 4th time this has occurred. Pt feels better after he rests/ sleep for several hours.   Pt lives alone but has a neighbor that prepares meals for him.   (Pt was seen May 2013 at  [facility] with c/o cold intolerance. A1c=5.1%. All labs were normal except  CBC showed elevated platelets >500K).   Review of Systems  Constitutional: Positive for chills, appetite change and unexpected weight change. Negative for fever and diaphoresis.  HENT: Positive for dental problem.   Eyes: Negative.   Respiratory: Negative.   Cardiovascular: Positive for palpitations.       Fast heart rate occurs with chills.  Gastrointestinal: Positive for nausea, vomiting, abdominal pain and diarrhea. Negative for blood in stool.  Endocrine: Positive for cold intolerance.  Genitourinary: Negative for urgency, frequency, hematuria, flank pain, decreased urine volume and difficulty urinating.  Musculoskeletal: Negative.   Skin:       Very dry skin.  Neurological: Positive for weakness. Negative for dizziness, seizures, syncope, numbness and headaches.  Hematological: Negative.   Psychiatric/Behavioral: Negative.        Objective:   Physical Exam  Nursing note and vitals reviewed. Constitutional: He is oriented to person, place, and time. He appears well-developed and well-nourished. No distress.  Pt appears stated age. He is mildly dehydrated.  HENT:  Head: Normocephalic and atraumatic.  Right Ear: Hearing, external ear and ear canal normal. Tympanic membrane is scarred. No decreased hearing is noted.  Left Ear:  Hearing, external ear and ear canal normal. Tympanic membrane is scarred. No decreased hearing is noted.  Nose: Nose normal. No nasal deformity or septal deviation.  Mouth/Throat: Uvula is midline and oropharynx is clear and moist. Mucous membranes are pale, dry and not cyanotic. Abnormal dentition. Dental caries present.  Eyes: Conjunctivae, EOM and lids are normal.  Sclerae muddy and mildly icteric.  Cardiovascular: Normal rate, regular rhythm, S1 normal and S2 normal.   No extrasystoles are present. PMI is not displaced.  Exam reveals no gallop and no friction rub.   Murmur heard.  Crescendo systolic murmur is present with a grade of 3/6   Diastolic murmur is present with a grade of 1/6  Murmur best heard at LUSB and RUSB- soft blowing.  Pulmonary/Chest: Effort normal and breath sounds normal. No respiratory distress. He has no wheezes. He has no rales.  Abdominal: Soft. Normal appearance. He exhibits mass. He exhibits no distension, no abdominal bruit and no pulsatile midline mass. Bowel sounds are increased. There is no hepatosplenomegaly. There is no tenderness. There is no rigidity, no guarding and no CVA tenderness. No hernia.    Genitourinary: Rectum normal and prostate normal. Rectal exam shows no external hemorrhoid, no fissure, no mass, no tenderness and anal tone normal. Prostate is not tender.  Musculoskeletal:  Degenerative changes in hands, wrists, knees and ankles.  Neurological: He is alert and oriented to person, place, and time. No cranial nerve deficit. He exhibits normal muscle tone. Coordination normal.  Reflex Scores:      Bicep reflexes are 1+ on  the right side and 1+ on the left side.      Patellar reflexes are 1+ on the right side and 1+ on the left side. Skin: Skin is warm, dry and intact. No rash noted. He is not diaphoretic. Nails show no clubbing.  Skin turgor is fair. Skin and feet and and lower legs very dry; nails are thick and discolored.  Psychiatric: He has  a normal mood and affect. His behavior is normal. Judgment and thought content normal.     Results for orders placed in visit on 10/17/12  IFOBT (OCCULT BLOOD)      Result Value Range   IFOBT Negative    POCT URINALYSIS DIPSTICK      Result Value Range   Color, UA yellow     Clarity, UA clear     Glucose, UA neg     Bilirubin, UA small     Ketones, UA neg     Spec Grav, UA 1.015     Blood, UA neg     pH, UA 8.5     Protein, UA 30     Urobilinogen, UA 4.0     Nitrite, UA neg     Leukocytes, UA Negative       [facility] reading (PRIMARY) by  Dr. Audria Nine: Chest- wide aortic arch/ cardiac silhouette                                                                             Spine- scoliosis; degenerative disc disease.                                                                             Abdomen- increased stool burden; no clear air-fluid levels; possible distended loops.      Assessment & Plan:  Abdominal  pain, other specified site - Plan: TSH, Vitamin D, 25-hydroxy, Lipase, Comprehensive metabolic panel, CBC, PSA, Medicare, DG Abd Acute W/Chest, POCT urinalysis dipstick Maintain clear liquid/ bland diet until seen by me tomorrow.  Diarrhea - Plan: TSH, Vitamin D, 25-hydroxy, Lipase, Comprehensive metabolic panel, CBC, PSA, Medicare, DG Abd Acute W/Chest, IFOBT (Occult Blood) As above.  Abnormal CBC - Plan: TSH, Vitamin D, 25-hydroxy, Lipase, Comprehensive metabolic panel, CBC, PSA, Medicare, DG Abd Acute W/Chest  Undiagnosed cardiac murmurs-  I am concerned about SBE, given pt symptoms and heart murmur not documented elsewhere in the medical record. I will have pt return to see me tomorrow to review labs and xrays.

## 2012-10-18 ENCOUNTER — Ambulatory Visit (INDEPENDENT_AMBULATORY_CARE_PROVIDER_SITE_OTHER): Payer: Medicare Other | Admitting: Family Medicine

## 2012-10-18 ENCOUNTER — Encounter: Payer: Self-pay | Admitting: Family Medicine

## 2012-10-18 VITALS — BP 140/76 | HR 68 | Temp 97.5°F | Resp 16 | Ht 65.0 in | Wt 173.0 lb

## 2012-10-18 DIAGNOSIS — R945 Abnormal results of liver function studies: Secondary | ICD-10-CM

## 2012-10-18 DIAGNOSIS — R109 Unspecified abdominal pain: Secondary | ICD-10-CM

## 2012-10-18 DIAGNOSIS — R011 Cardiac murmur, unspecified: Secondary | ICD-10-CM

## 2012-10-18 DIAGNOSIS — R0600 Dyspnea, unspecified: Secondary | ICD-10-CM

## 2012-10-18 DIAGNOSIS — R7989 Other specified abnormal findings of blood chemistry: Secondary | ICD-10-CM

## 2012-10-18 DIAGNOSIS — R0989 Other specified symptoms and signs involving the circulatory and respiratory systems: Secondary | ICD-10-CM

## 2012-10-18 NOTE — Patient Instructions (Addendum)
You are going to have 2 imaging tests done. One test is to look at your liver and abdomen and the other is to look at your heart/ valves to see if you have some problems inside the heart. After these tests results, you will be contacted about your next appointment. If you have any problems or feel worse, please go to the [facility] Emergency Department.

## 2012-10-20 ENCOUNTER — Encounter: Payer: Self-pay | Admitting: Family Medicine

## 2012-10-20 DIAGNOSIS — Z87438 Personal history of other diseases of male genital organs: Secondary | ICD-10-CM | POA: Insufficient documentation

## 2012-10-20 NOTE — Progress Notes (Signed)
S:  This 79 y.o. AA male returns 1 day after initial evaluation for a variety of symptoms- chills, decreased appetite,dyspnea, n/v and change of stools, abd pain and general feeling of "not feeling well". He has not had any episodes of chills, n/v/d but still has DOE. He has been able to eat w/o abd pain; he slept well last night. His energy level is good. He denies fever, anorexia, CP or tightness, cough, BRBPR or melena, abd or back pain, myalgias, HA, dizziness, weakness, tremor or syncope.  ROS: As per HPI.  O: Filed Vitals:   10/18/12 1121  BP: 140/76  Pulse: 68  Temp: 97.5 F (36.4 C)  Resp: 16   GEN: In NAD; WN,WD. Appears stated age. COR: RRR. LUNGS: Normal resp rate and effort. SKIN: W&D. NEURO: A&O x 3; CNs intact. Nonfocal.   Results for orders placed in visit on 10/17/12  TSH      Result Value Range   TSH 0.897  0.350 - 4.500 uIU/mL  VITAMIN D 25 HYDROXY      Result Value Range   Vit D, 25-Hydroxy 27 (*) 30 - 89 ng/mL  LIPASE      Result Value Range   Lipase 48  0 - 75 U/L  COMPREHENSIVE METABOLIC PANEL      Result Value Range   Sodium 139  135 - 145 mEq/L   Potassium 4.7  3.5 - 5.3 mEq/L   Chloride 103  96 - 112 mEq/L   CO2 28  19 - 32 mEq/L   Glucose, Bld 85  70 - 99 mg/dL   BUN 14  6 - 23 mg/dL   Creat 1.61  0.96 - 0.45 mg/dL   Total Bilirubin 1.6 (*) 0.3 - 1.2 mg/dL   Alkaline Phosphatase 485 (*) 39 - 117 U/L   AST 25  0 - 37 U/L   ALT 52  0 - 53 U/L   Total Protein 6.8  6.0 - 8.3 g/dL   Albumin 3.9  3.5 - 5.2 g/dL   Calcium 9.4  8.4 - 40.9 mg/dL  CBC      Result Value Range   WBC 5.2  4.0 - 10.5 K/uL   RBC 4.89  4.22 - 5.81 MIL/uL   Hemoglobin 14.4  13.0 - 17.0 g/dL   HCT 81.1  91.4 - 78.2 %   MCV 87.9  78.0 - 100.0 fL   MCH 29.4  26.0 - 34.0 pg   MCHC 33.5  30.0 - 36.0 g/dL   RDW 95.6  21.3 - 08.6 %   Platelets 597 (*) 150 - 400 K/uL  PSA, MEDICARE      Result Value Range   PSA 5.76 (*) <=4.00 ng/mL  IFOBT (OCCULT BLOOD)      Result Value  Range   IFOBT Negative    POCT URINALYSIS DIPSTICK      Result Value Range   Color, UA yellow     Clarity, UA clear     Glucose, UA neg     Bilirubin, UA small     Ketones, UA neg     Spec Grav, UA 1.015     Blood, UA neg     pH, UA 8.5     Protein, UA 30     Urobilinogen, UA 4.0     Nitrite, UA neg     Leukocytes, UA Negative      A/P: Abdominal  pain, other specified site - Plan: US Abdomen Limited RUQ  Abnormal  LFTs - Plan: US Abdomen Limited RUQ  Undiagnosed cardiac murmurs - Plan: 2D Echocardiogram without contrast  Dyspnea - Plan: 2D Echocardiogram without contrast   I reviewed all the labs w/ the pt and explained plan going forward to evaluate symptoms. Elevated Alk phos may be due to liver abnormality or bone disease related to prostate disease. Pt sees Dr. Brunilda Payor annually. Pt advised to seek care at [facility] ED if his symptoms become persistent and/or worsen in any way. He understands and agrees.

## 2012-10-24 ENCOUNTER — Telehealth: Payer: Self-pay | Admitting: Family Medicine

## 2012-10-24 ENCOUNTER — Ambulatory Visit
Admission: RE | Admit: 2012-10-24 | Discharge: 2012-10-24 | Disposition: A | Discharge status: Home / Self Care | Payer: Medicare Other | Source: Ambulatory Visit | Attending: Family Medicine | Admitting: Family Medicine

## 2012-10-24 DIAGNOSIS — R109 Unspecified abdominal pain: Secondary | ICD-10-CM

## 2012-10-24 DIAGNOSIS — R1011 Right upper quadrant pain: Secondary | ICD-10-CM

## 2012-10-24 DIAGNOSIS — R945 Abnormal results of liver function studies: Secondary | ICD-10-CM

## 2012-10-24 DIAGNOSIS — D1803 Hemangioma of intra-abdominal structures: Secondary | ICD-10-CM

## 2012-10-24 NOTE — Telephone Encounter (Signed)
I called pt to discuss Abd [place] results; liver hemangioma seen in 2012 on CT. This area persists, is a little larger and may have some internal thrombosis. Pt is eating a limited diet, has no n/v/d, stools are normal and he is pain-free. He has not had any more fever/chills. I am referring him to GI specialist for further evaluation.

## 2012-10-25 ENCOUNTER — Encounter: Payer: Self-pay | Admitting: Gastroenterology

## 2012-11-03 DIAGNOSIS — I340 Nonrheumatic mitral (valve) insufficiency: Secondary | ICD-10-CM

## 2012-11-03 HISTORY — DX: Nonrheumatic mitral (valve) insufficiency: I34.0

## 2012-11-03 HISTORY — PX: TRANSTHORACIC ECHOCARDIOGRAM: SHX275

## 2012-11-05 ENCOUNTER — Ambulatory Visit (HOSPITAL_COMMUNITY): Payer: Medicare Other | Attending: Cardiology

## 2012-11-05 DIAGNOSIS — R0609 Other forms of dyspnea: Secondary | ICD-10-CM | POA: Insufficient documentation

## 2012-11-05 DIAGNOSIS — R011 Cardiac murmur, unspecified: Secondary | ICD-10-CM

## 2012-11-05 DIAGNOSIS — R0600 Dyspnea, unspecified: Secondary | ICD-10-CM

## 2012-11-05 DIAGNOSIS — R0602 Shortness of breath: Secondary | ICD-10-CM

## 2012-11-05 DIAGNOSIS — R0989 Other specified symptoms and signs involving the circulatory and respiratory systems: Secondary | ICD-10-CM | POA: Insufficient documentation

## 2012-11-05 NOTE — Progress Notes (Signed)
Echocardiogram performed.  

## 2012-11-18 ENCOUNTER — Ambulatory Visit (INDEPENDENT_AMBULATORY_CARE_PROVIDER_SITE_OTHER): Payer: Medicare Other | Admitting: Gastroenterology

## 2012-11-18 ENCOUNTER — Encounter: Payer: Self-pay | Admitting: Gastroenterology

## 2012-11-18 VITALS — BP 200/98 | HR 76 | Ht 65.0 in | Wt 176.8 lb

## 2012-11-18 DIAGNOSIS — K7689 Other specified diseases of liver: Secondary | ICD-10-CM

## 2012-11-18 DIAGNOSIS — R109 Unspecified abdominal pain: Secondary | ICD-10-CM

## 2012-11-18 DIAGNOSIS — D1803 Hemangioma of intra-abdominal structures: Secondary | ICD-10-CM

## 2012-11-18 DIAGNOSIS — K769 Liver disease, unspecified: Secondary | ICD-10-CM

## 2012-11-18 DIAGNOSIS — R1901 Right upper quadrant abdominal swelling, mass and lump: Secondary | ICD-10-CM | POA: Insufficient documentation

## 2012-11-18 NOTE — Assessment & Plan Note (Signed)
Intermittent right upper quadrant pain together with elevated alkaline phosphatase and questionable defects in the distal bile duct raises the question of retained bile duct stones.  Recommendations #1 MRCP #2 to consider EUS pending results of #1

## 2012-11-18 NOTE — Patient Instructions (Addendum)
You have been scheduled for an MRCP at [facility] on 11/21/2012. Your appointment time is 1pm. Please arrive 15 minutes prior to your appointment time for registration purposes. There is no prep for this test. However, if you have any metal in your body, have a pacemaker or defibrillator, please be sure to let your ordering physician know. This test typically takes 45 minutes to 1 hour to complete. Nothing to eat or drink 4 hours before your test

## 2012-11-18 NOTE — Assessment & Plan Note (Addendum)
Right upper quadrant mass-rule out intrabdominal mass versus lipoma  Recommendations #1 MRI

## 2012-11-18 NOTE — Progress Notes (Signed)
History of Present Illness: Pleasant 79 year old Afro-American male referred at the request of Dr. Audria Nine for evaluation of abdominal pain.  Since his cholecystectomy in 2012 he has suffered from intermittent severe upper abdominal pain. Pain is poorly described. He states it is intense and may last hours to an entire day. There are no ameliorating factors. He claims that the pain is exactly like his pain from gallstones.  It is sometimes accompanied by nausea and vomiting. Preoperatively he underwent EUS which showed a dilated duct but no stones. LFTs were abnormal. Intraoperative cholangiogram did demonstrated some irregularity of the distal bile duct. On my review, I question whether there are few small retained bile duct stones. Followup LFTs were normal postoperatively and in May, 2013. In May, 2014 alkaline phosphatase was 485 and ALT 52. Abdominal ultrasound, which I reviewed, demonstrated a probable hemangioma and persistent dilatation of the common bile duct.    Past Medical History  Diagnosis Date  . BPH (benign prostatic hyperplasia)    Past Surgical History  Procedure Laterality Date  . Prostate surgery      Dr. Su Grand  . Cholecystectomy  07/04/2010   family history includes Diabetes in his sister. No current outpatient prescriptions on file.   No current facility-administered medications for this visit.   Allergies as of 11/18/2012  . (No Known Allergies)    reports that he has quit smoking. His smoking use included Cigarettes. He smoked 0.00 packs per day. He has never used smokeless tobacco. He reports that he does not drink alcohol or use illicit drugs.     Review of Systems: Pertinent positive and negative review of systems were noted in the above HPI section. All other review of systems were otherwise negative.  Vital signs were reviewed in today's medical record Physical Exam: General: Well developed , well nourished, no acute distress Skin: anicteric Head:  Normocephalic and atraumatic Eyes:  sclerae anicteric, EOMI Ears: Normal auditory acuity Mouth: No deformity or lesions Neck: Supple, no masses or thyromegaly Lungs: Clear throughout to auscultation Heart: Regular rate and rhythm; no rubs or bruits. There is a 2-3/6 early systolic murmur Abdomen: Soft, non tender and non distended. No  hepatosplenomegaly or hernias noted. Normal Bowel sounds. There is a palpable mass in the right upper quadrant measuring at least 2.4 cm just below the right costal margin. There is no obvious hernia. Mass could be a lipoma. It is nontender and minimally mobile. Rectal:deferred Musculoskeletal: Symmetrical with no gross deformities  Skin: No lesions on visible extremities Pulses:  Normal pulses noted Extremities: No clubbing, cyanosis, edema or deformities noted Neurological: Alert oriented x 4, grossly nonfocal Cervical Nodes:  No significant cervical adenopathy Inguinal Nodes: No significant inguinal adenopathy Psychological:  Alert and cooperative. Normal mood and affect

## 2012-11-21 ENCOUNTER — Other Ambulatory Visit: Payer: Self-pay | Admitting: Gastroenterology

## 2012-11-21 ENCOUNTER — Ambulatory Visit (HOSPITAL_COMMUNITY)
Admission: RE | Admit: 2012-11-21 | Discharge: 2012-11-21 | Disposition: A | Discharge status: Home / Self Care | Payer: Medicare Other | Source: Ambulatory Visit | Attending: Gastroenterology | Admitting: Gastroenterology

## 2012-11-21 DIAGNOSIS — K8050 Calculus of bile duct without cholangitis or cholecystitis without obstruction: Secondary | ICD-10-CM | POA: Insufficient documentation

## 2012-11-21 DIAGNOSIS — K769 Liver disease, unspecified: Secondary | ICD-10-CM

## 2012-11-21 DIAGNOSIS — R7989 Other specified abnormal findings of blood chemistry: Secondary | ICD-10-CM | POA: Insufficient documentation

## 2012-11-21 DIAGNOSIS — D1803 Hemangioma of intra-abdominal structures: Secondary | ICD-10-CM | POA: Insufficient documentation

## 2012-11-21 DIAGNOSIS — Z9089 Acquired absence of other organs: Secondary | ICD-10-CM | POA: Insufficient documentation

## 2012-11-21 MED ORDER — GADOBENATE DIMEGLUMINE 529 MG/ML IV SOLN
20.0000 mL | Freq: Once | INTRAVENOUS | Status: AC | PRN
  Administered 2012-11-21: 16 mL via INTRAVENOUS

## 2012-11-25 ENCOUNTER — Other Ambulatory Visit: Payer: Self-pay | Admitting: Gastroenterology

## 2012-11-25 ENCOUNTER — Encounter (HOSPITAL_COMMUNITY): Payer: Self-pay | Admitting: *Deleted

## 2012-11-25 DIAGNOSIS — K8020 Calculus of gallbladder without cholecystitis without obstruction: Secondary | ICD-10-CM

## 2012-11-26 ENCOUNTER — Encounter (HOSPITAL_COMMUNITY): Payer: Self-pay | Admitting: Pharmacy Technician

## 2012-11-27 ENCOUNTER — Ambulatory Visit (HOSPITAL_COMMUNITY)
Admission: RE | Admit: 2012-11-27 | Discharge: 2012-11-27 | Disposition: A | Discharge status: Home / Self Care | Payer: Medicare Other | Source: Ambulatory Visit | Attending: Gastroenterology | Admitting: Gastroenterology

## 2012-11-27 ENCOUNTER — Encounter (HOSPITAL_COMMUNITY): Payer: Self-pay

## 2012-11-27 ENCOUNTER — Encounter (HOSPITAL_COMMUNITY)
Admission: RE | Disposition: A | Discharge status: Home / Self Care | Payer: Self-pay | Source: Ambulatory Visit | Attending: Gastroenterology

## 2012-11-27 ENCOUNTER — Ambulatory Visit (HOSPITAL_COMMUNITY): Payer: Medicare Other | Admitting: Anesthesiology

## 2012-11-27 ENCOUNTER — Encounter (HOSPITAL_COMMUNITY): Payer: Self-pay | Admitting: Anesthesiology

## 2012-11-27 ENCOUNTER — Ambulatory Visit: Payer: Medicare Other | Admitting: Family Medicine

## 2012-11-27 ENCOUNTER — Ambulatory Visit (HOSPITAL_COMMUNITY): Payer: Medicare Other

## 2012-11-27 DIAGNOSIS — N400 Enlarged prostate without lower urinary tract symptoms: Secondary | ICD-10-CM | POA: Insufficient documentation

## 2012-11-27 DIAGNOSIS — K838 Other specified diseases of biliary tract: Secondary | ICD-10-CM | POA: Insufficient documentation

## 2012-11-27 DIAGNOSIS — K8070 Calculus of gallbladder and bile duct without cholecystitis without obstruction: Secondary | ICD-10-CM | POA: Insufficient documentation

## 2012-11-27 DIAGNOSIS — K8020 Calculus of gallbladder without cholecystitis without obstruction: Secondary | ICD-10-CM

## 2012-11-27 DIAGNOSIS — K8050 Calculus of bile duct without cholangitis or cholecystitis without obstruction: Secondary | ICD-10-CM

## 2012-11-27 HISTORY — PX: ERCP: SHX5425

## 2012-11-27 SURGERY — ERCP, WITH INTERVENTION IF INDICATED
Anesthesia: General

## 2012-11-27 MED ORDER — LACTATED RINGERS IV SOLN
INTRAVENOUS | Status: DC
  Administered 2012-11-27 (×2): via INTRAVENOUS

## 2012-11-27 MED ORDER — CIPROFLOXACIN IN D5W 400 MG/200ML IV SOLN
INTRAVENOUS | Status: AC
  Filled 2012-11-27: qty 200

## 2012-11-27 MED ORDER — FENTANYL CITRATE 0.05 MG/ML IJ SOLN
INTRAMUSCULAR | Status: DC | PRN
  Administered 2012-11-27: 100 ug via INTRAVENOUS

## 2012-11-27 MED ORDER — SODIUM CHLORIDE 0.9 % IV SOLN: INTRAVENOUS | Status: DC

## 2012-11-27 MED ORDER — CIPROFLOXACIN IN D5W 400 MG/200ML IV SOLN
400.0000 mg | Freq: Once | INTRAVENOUS | Status: AC
  Administered 2012-11-27: 400 mg via INTRAVENOUS

## 2012-11-27 MED ORDER — SODIUM CHLORIDE 0.9 % IV SOLN
INTRAVENOUS | Status: DC | PRN
  Administered 2012-11-27: 15:00:00

## 2012-11-27 MED ORDER — SUCCINYLCHOLINE CHLORIDE 20 MG/ML IJ SOLN
INTRAMUSCULAR | Status: DC | PRN
  Administered 2012-11-27: 100 mg via INTRAVENOUS
  Administered 2012-11-27: 30 mg via INTRAVENOUS

## 2012-11-27 MED ORDER — ROCURONIUM BROMIDE 100 MG/10ML IV SOLN: INTRAVENOUS | Status: DC | PRN

## 2012-11-27 MED ORDER — GLYCOPYRROLATE 0.2 MG/ML IJ SOLN
INTRAMUSCULAR | Status: DC | PRN
  Administered 2012-11-27: 0.2 mg via INTRAVENOUS

## 2012-11-27 MED ORDER — PROPOFOL 10 MG/ML IV BOLUS
INTRAVENOUS | Status: DC | PRN
  Administered 2012-11-27: 30 mg via INTRAVENOUS
  Administered 2012-11-27: 170 mg via INTRAVENOUS

## 2012-11-27 MED ORDER — LIDOCAINE HCL (CARDIAC) 20 MG/ML IV SOLN
INTRAVENOUS | Status: DC | PRN
  Administered 2012-11-27: 50 mg via INTRAVENOUS

## 2012-11-27 NOTE — Transfer of Care (Signed)
Immediate Anesthesia Transfer of Care Note  Patient: [name]  Procedure(s) Performed: Procedure(s): ENDOSCOPIC RETROGRADE CHOLANGIOPANCREATOGRAPHY (ERCP) (N/A)  Patient Location: PACU  Anesthesia Type:General  Level of Consciousness: awake, alert , sedated and patient cooperative  Airway & Oxygen Therapy: Patient Spontanous Breathing and Patient connected to nasal cannula oxygen  Post-op Assessment: Report given to PACU RN and Post -op Vital signs reviewed and stable  Post vital signs: Reviewed and stable  Complications: No apparent anesthesia complications

## 2012-11-27 NOTE — Op Note (Signed)
[facility] [address] [place], [address]   ERCP PROCEDURE REPORT  PATIENT: Sommers, Maryella Shivers.  MR# :[id number] BIRTHDATE: 16-Sep-1929  GENDER: Male ENDOSCOPIST: Louis Meckel, MD REFERRED BY: Dow Adolph, M.D. PROCEDURE DATE:  11/27/2012 PROCEDURE:   ERCP with sphincterotomy/papillotomy and ERCP with removal of calculus/calculi ASA CLASS:   Class II INDICATIONS:suspected or rule out bile duct stones. MEDICATIONS: MAC sedation, administered by CRNA and Cipro 400 mg IV  TOPICAL ANESTHETIC:  DESCRIPTION OF PROCEDURE:   After the risks benefits and alternatives of the procedure were thoroughly explained, informed consent was obtained.  The     endoscope was introduced through the mouth  and advanced to the third portion of the duodenum .  A biliary duodenal fistula was seen just superior of the ampulla of Vater. The common bile duct was selectively cannulated and was massively dilated.  Multiple filling defects were seen but the bile duct was not well visualized because of its size. An 18 mm sphincterotomy was made.  The bile duct was swept multiple times with an 18 mm balloon stone extractor which was pulled through the sphincterotomized papilla.  Multiple stones and large amounts of sludge were removed.  The largest stone measured 18-20 mm.  After doing this several times no more stones or fragments were seen.  Final cholangiogram was normal except for massive dilatation of the duct. The scope was then completely withdrawn from the patient and the procedure terminated.     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

## 2012-11-27 NOTE — Anesthesia Postprocedure Evaluation (Signed)
Anesthesia Post Note  Patient: [name]  Procedure(s) Performed: Procedure(s) (LRB): ENDOSCOPIC RETROGRADE CHOLANGIOPANCREATOGRAPHY (ERCP) (N/A)  Anesthesia type: General  Patient location: PACU  Post pain: Pain level controlled  Post assessment: Post-op Vital signs reviewed  Last Vitals:  Filed Vitals:   11/27/12 1625  BP: 170/95  Temp:   Resp: 16    Post vital signs: Reviewed  Level of consciousness: sedated  Complications: No apparent anesthesia complications

## 2012-11-27 NOTE — Anesthesia Preprocedure Evaluation (Addendum)
Anesthesia Evaluation  Patient identified by MRN, date of birth, ID band Patient awake    Reviewed: Allergy & Precautions, H&P , NPO status , Patient's Chart, lab work & pertinent test results  Airway Mallampati: II TM Distance: >3 FB Neck ROM: Full    Dental  (+) Dental Advisory Given, Poor Dentition, Missing and Loose   Pulmonary shortness of breath, former smoker,  breath sounds clear to auscultation  Pulmonary exam normal       Cardiovascular negative cardio ROS  Rhythm:Regular Rate:Normal     Neuro/Psych negative neurological ROS  negative psych ROS   GI/Hepatic negative GI ROS, Neg liver ROS,   Endo/Other  negative endocrine ROS  Renal/GU negative Renal ROS  negative genitourinary   Musculoskeletal negative musculoskeletal ROS (+)   Abdominal   Peds  Hematology negative hematology ROS (+)   Anesthesia Other Findings Multiple missing and loose teeth  Reproductive/Obstetrics                         Anesthesia Physical Anesthesia Plan  ASA: II  Anesthesia Plan: General   Post-op Pain Management:    Induction: Intravenous  Airway Management Planned: Oral ETT  Additional Equipment:   Intra-op Plan:   Post-operative Plan:   Informed Consent: I have reviewed the patients History and Physical, chart, labs and discussed the procedure including the risks, benefits and alternatives for the proposed anesthesia with the patient or authorized representative who has indicated his/her understanding and acceptance.   Dental advisory given  Plan Discussed with: CRNA  Anesthesia Plan Comments:         Anesthesia Quick Evaluation

## 2012-11-27 NOTE — Interval H&P Note (Signed)
History and Physical Interval Note:  11/27/2012 2:05 PM  [name]  has presented today for surgery, with the diagnosis of Gallstone [574.20]  The various methods of treatment have been discussed with the patient and family. After consideration of risks, benefits and other options for treatment, the patient has consented to  Procedure(s): ENDOSCOPIC RETROGRADE CHOLANGIOPANCREATOGRAPHY (ERCP) (N/A) as a surgical intervention .  The patient's history has been reviewed, patient examined, no change in status, stable for surgery.  I have reviewed the patient's chart and labs.  Questions were answered to the patient's satisfaction.   MRCP demonstrated a large distal bile duct stone  Melvia Heaps

## 2012-11-27 NOTE — H&P (View-Only) (Signed)
History of Present Illness: [name] 79 year old Afro-American male referred at the request of Dr. Audria Nine for evaluation of abdominal pain.  Since his cholecystectomy in 2012 he has suffered from intermittent severe upper abdominal pain. Pain is poorly described. He states it is intense and may last hours to an entire day. There are no ameliorating factors. He claims that the pain is exactly like his pain from gallstones.  It is sometimes accompanied by nausea and vomiting. Preoperatively he underwent EUS which showed a dilated duct but no stones. LFTs were abnormal. Intraoperative cholangiogram did demonstrated some irregularity of the distal bile duct. On my review, I question whether there are few small retained bile duct stones. Followup LFTs were normal postoperatively and in May, 2013. In May, 2014 alkaline phosphatase was 485 and ALT 52. Abdominal ultrasound, which I reviewed, demonstrated a probable hemangioma and persistent dilatation of the common bile duct.    Past Medical History  Diagnosis Date  . BPH (benign prostatic hyperplasia)    Past Surgical History  Procedure Laterality Date  . Prostate surgery      Dr. Su Grand  . Cholecystectomy  07/04/2010   family history includes Diabetes in his sister. No current outpatient prescriptions on file.   No current facility-administered medications for this visit.   Allergies as of 11/18/2012  . (No Known Allergies)    reports that he has quit smoking. His smoking use included Cigarettes. He smoked 0.00 packs per day. He has never used smokeless tobacco. He reports that he does not drink alcohol or use illicit drugs.     Review of Systems: Pertinent positive and negative review of systems were noted in the above HPI section. All other review of systems were otherwise negative.  Vital signs were reviewed in today's medical record Physical Exam: General: Well developed , well nourished, no acute distress Skin: anicteric Head:  Normocephalic and atraumatic Eyes:  sclerae anicteric, EOMI Ears: Normal auditory acuity Mouth: No deformity or lesions Neck: Supple, no masses or thyromegaly Lungs: Clear throughout to auscultation Heart: Regular rate and rhythm; no rubs or bruits. There is a 2-3/6 early systolic murmur Abdomen: Soft, non tender and non distended. No  hepatosplenomegaly or hernias noted. Normal Bowel sounds. There is a palpable mass in the right upper quadrant measuring at least 2.4 cm just below the right costal margin. There is no obvious hernia. Mass could be a lipoma. It is nontender and minimally mobile. Rectal:deferred Musculoskeletal: Symmetrical with no gross deformities  Skin: No lesions on visible extremities Pulses:  Normal pulses noted Extremities: No clubbing, cyanosis, edema or deformities noted Neurological: Alert oriented x 4, grossly nonfocal Cervical Nodes:  No significant cervical adenopathy Inguinal Nodes: No significant inguinal adenopathy Psychological:  Alert and cooperative. Normal mood and affect

## 2012-11-28 ENCOUNTER — Encounter (HOSPITAL_COMMUNITY): Payer: Self-pay | Admitting: Gastroenterology

## 2012-12-02 ENCOUNTER — Ambulatory Visit (INDEPENDENT_AMBULATORY_CARE_PROVIDER_SITE_OTHER): Payer: Medicare Other | Admitting: Family Medicine

## 2012-12-02 VITALS — BP 172/96 | HR 74 | Temp 98.1°F | Resp 16 | Ht 65.0 in | Wt 174.6 lb

## 2012-12-02 DIAGNOSIS — I10 Essential (primary) hypertension: Secondary | ICD-10-CM

## 2012-12-02 MED ORDER — LISINOPRIL-HYDROCHLOROTHIAZIDE 10-12.5 MG PO TABS: 1.0000 | ORAL_TABLET | Freq: Every day | ORAL | Status: DC

## 2012-12-02 NOTE — Patient Instructions (Addendum)

## 2012-12-02 NOTE — Progress Notes (Signed)
79 yo patient recently hospitalized for bile duct calculus treated by Dr. Melvia Heaps with much improvement.  At the time, his blood pressure was found to be elevated.  He bought a monitor and blood pressure has remained elevated. He gets short of breath.  Objective:  142/90 Heart:  Regular, II/VI soft early systolic murmur Chest:  Clear Ext:  No edema  Assessment:  Borderline blood pressure.  Plan: Avoid salted foods Daily walk Hypertension - Plan: lisinopril-hydrochlorothiazide (PRINZIDE,ZESTORETIC) 10-12.5 MG per tablet  Signed, Elvina Sidle, MD

## 2012-12-11 ENCOUNTER — Encounter: Payer: Self-pay | Admitting: Gastroenterology

## 2012-12-11 ENCOUNTER — Other Ambulatory Visit: Payer: Medicare Other

## 2012-12-11 ENCOUNTER — Ambulatory Visit (INDEPENDENT_AMBULATORY_CARE_PROVIDER_SITE_OTHER): Payer: Medicare Other | Admitting: Gastroenterology

## 2012-12-11 VITALS — BP 134/80 | HR 76 | Ht 65.0 in | Wt 171.0 lb

## 2012-12-11 DIAGNOSIS — K7689 Other specified diseases of liver: Secondary | ICD-10-CM

## 2012-12-11 DIAGNOSIS — K8050 Calculus of bile duct without cholangitis or cholecystitis without obstruction: Secondary | ICD-10-CM

## 2012-12-11 DIAGNOSIS — K769 Liver disease, unspecified: Secondary | ICD-10-CM

## 2012-12-11 DIAGNOSIS — D1803 Hemangioma of intra-abdominal structures: Secondary | ICD-10-CM

## 2012-12-11 LAB — HEPATIC FUNCTION PANEL
Alkaline Phosphatase: 140 U/L — ABNORMAL HIGH (ref 39–117)
Bilirubin, Direct: 0.2 mg/dL (ref 0.0–0.3)
Total Bilirubin: 0.8 mg/dL (ref 0.3–1.2)
Total Protein: 7.2 g/dL (ref 6.0–8.3)

## 2012-12-11 NOTE — Progress Notes (Signed)
History of Present Illness:  The patient has returned following ERCP with sphincterotomy and stone extraction. He had multiple large bile duct stones which were removed. Since his procedure he's had no further episodes of pain and feels quite well. MRI verified that the liver lesion is a hemangioma.    Review of Systems: Pertinent positive and negative review of systems were noted in the above HPI section. All other review of systems were otherwise negative.    Current Medications, Allergies, Past Medical History, Past Surgical History, Family History and Social History were reviewed in [employer] electronic medical record  Vital signs were reviewed in today's medical record. Physical Exam: General: Well developed , well nourished, no acute distress

## 2012-12-11 NOTE — Assessment & Plan Note (Signed)
Post sphincterotomy and stone removal. This undoubtedly was the etiology for his pain.  Recommendations #1 followup liver function tests

## 2012-12-11 NOTE — Patient Instructions (Addendum)
Go to the basement for labs today 

## 2012-12-20 ENCOUNTER — Ambulatory Visit (INDEPENDENT_AMBULATORY_CARE_PROVIDER_SITE_OTHER): Payer: Medicare Other | Admitting: Family Medicine

## 2012-12-20 ENCOUNTER — Encounter: Payer: Self-pay | Admitting: Family Medicine

## 2012-12-20 VITALS — BP 143/81 | HR 73 | Temp 98.2°F | Resp 16 | Ht 65.0 in | Wt 174.0 lb

## 2012-12-20 DIAGNOSIS — R011 Cardiac murmur, unspecified: Secondary | ICD-10-CM

## 2012-12-20 DIAGNOSIS — I059 Rheumatic mitral valve disease, unspecified: Secondary | ICD-10-CM

## 2012-12-20 DIAGNOSIS — I10 Essential (primary) hypertension: Secondary | ICD-10-CM

## 2012-12-20 DIAGNOSIS — I340 Nonrheumatic mitral (valve) insufficiency: Secondary | ICD-10-CM

## 2012-12-20 NOTE — Progress Notes (Signed)
S: This 79 y.o. AA male returns for follow-up after GI procedure (ERCP w/ removal of large bile duct stones by Dr. Arlyce Dice in late June 2104). Pt feels "100% better"; appetite is much improved. HE has no fever/ abd pain, n/v and stools are normal. He has no food intolerances.   Pt noted to have elevated BP in June and medication treated was initiated on 12/02/12 by Dr. Milus Glazier; pt is compliant w/ medication w/o  Side effects. He denies CP or tightness, palpitations, SOB or DOE, cough, HA, dizziness, weakness, numbness, slurred speech or syncope.  Cardiac murmur was diagnosed in May and 2D ECHO on November 05, 2012 revealed some mild valvular heart disease (aortic and mitral valves); EF= 65%. Pt is asymptomatic.  PMHx, Soc Hx and Problem List reviewed.  O: Filed Vitals:   12/20/12 1544  BP: 143/81  Pulse: 73  Temp: 98.2 F (36.8 C)  Resp: 16   GEN: In NAD; WN,WD. Weight stable. HENT: [place]/AT; EOMI w/ clear conj and muddy sclerae. Dentition- fair. Oroph moist. COR: RRR; Grade 2/6 SEM at LUSB. LUNGS: CTA; Normal resp rate and effort. SKIN: W&D. NEURO: A&Ox 3; CNs intact. Nonfocal.  A/P: HTN (hypertension)- Stable and controlled on current medication; continue Lisinopril- HCTZ 10-12.5 mg  1 tab daily.  Mitral valve regurgitation/ valvular heart disease (see 2D ECHO report) - Monitor closely; consider referral to CARDs when pt returns in 3 months.

## 2012-12-20 NOTE — Patient Instructions (Addendum)
Continue taking the medication for blood pressure and eat regularly. Lots of fruits and vegetables are good for you. Get plenty of rest is important also.   I will see you in 3 months.

## 2013-03-28 ENCOUNTER — Encounter: Payer: Self-pay | Admitting: Family Medicine

## 2013-03-28 ENCOUNTER — Ambulatory Visit (INDEPENDENT_AMBULATORY_CARE_PROVIDER_SITE_OTHER): Payer: Medicare Other | Admitting: Family Medicine

## 2013-03-28 VITALS — BP 126/84 | HR 76 | Temp 97.6°F | Resp 16 | Ht 64.0 in | Wt 172.0 lb

## 2013-03-28 DIAGNOSIS — R011 Cardiac murmur, unspecified: Secondary | ICD-10-CM

## 2013-03-28 DIAGNOSIS — I10 Essential (primary) hypertension: Secondary | ICD-10-CM

## 2013-03-28 DIAGNOSIS — R0609 Other forms of dyspnea: Secondary | ICD-10-CM

## 2013-03-28 NOTE — Progress Notes (Signed)
S:  This 79 y.o. AA male has well controlled HTN; he has no adverse medication effects. Home BP= 110-160/70-90 (one reading of 94/50-60). Medication compliance is good. Pt tries to exercise but gets SOB. This was no better w/ a recumbent bicycle. Walking is limited due to R foot pain (plantar); no pain at rest. Pt denies diaphoresis, CP or tightness, palpitations, edema, cough, HA, dizziness, lightheadedness, weakness, numbness or syncope.  Patient Active Problem List   Diagnosis Date Noted  . Calculus of bile duct without mention of cholecystitis or obstruction 11/27/2012  . Abdominal mass, right upper quadrant 11/18/2012  . Liver hemangioma 11/18/2012  . History of BPH 10/20/2012  . Valvular heart disease 10/17/2012   PMHx, Soc Hx and Fam Hx reviewed.  ROS: As per HPI.  O: Filed Vitals:   03/28/13 1445  BP: 126/84  Pulse: 76  Temp: 97.6 F (36.4 C)  Resp: 16   GEN: In NAD; WN,WD. HENT: [place]/AT; EOMI w/ muddy sclerae. Dentition-poor. COR: RRR. No edema. LUNGS: Normal resp rate and effort. SKIN: W&D; intact. Dry areas on feet. MS: R foot- Normal appearance; no tenderness w/ palpaiton of bones. No swelling. NEURO: A&Ox 3; CNs intact. Nonfocal.  A/P: HTN (hypertension)- Stable and controlled; continue current medication.  DOE (dyspnea on exertion)- May be due to deconditioning or aortic and mitral valve disease.  Valvular heart disease - Plan: Ambulatory referral to Cardiology

## 2013-03-28 NOTE — Patient Instructions (Addendum)
I have placed an order for you to see a heart specialist (Cardiology) to evaluate the heart valves and your complaint of shortness of breath.  AT your next visit, you may receive the Pneumococcal vaccine if it will not hurt you to get this vaccine again. You probably had it when you turned 67, but we do not proof of that in your record.

## 2013-04-08 ENCOUNTER — Ambulatory Visit: Payer: Medicare Other | Admitting: Internal Medicine

## 2013-04-14 ENCOUNTER — Ambulatory Visit (INDEPENDENT_AMBULATORY_CARE_PROVIDER_SITE_OTHER): Payer: Medicare Other | Admitting: Internal Medicine

## 2013-04-14 ENCOUNTER — Encounter: Payer: Self-pay | Admitting: Internal Medicine

## 2013-04-14 VITALS — BP 168/88 | HR 74 | Ht 65.0 in | Wt 178.1 lb

## 2013-04-14 DIAGNOSIS — R9431 Abnormal electrocardiogram [ECG] [EKG]: Secondary | ICD-10-CM

## 2013-04-14 DIAGNOSIS — F17200 Nicotine dependence, unspecified, uncomplicated: Secondary | ICD-10-CM

## 2013-04-14 DIAGNOSIS — R0602 Shortness of breath: Secondary | ICD-10-CM

## 2013-04-14 DIAGNOSIS — R011 Cardiac murmur, unspecified: Secondary | ICD-10-CM

## 2013-04-14 DIAGNOSIS — Z8249 Family history of ischemic heart disease and other diseases of the circulatory system: Secondary | ICD-10-CM

## 2013-04-14 DIAGNOSIS — R0609 Other forms of dyspnea: Secondary | ICD-10-CM | POA: Insufficient documentation

## 2013-04-14 NOTE — Patient Instructions (Signed)
Schedule pulmonary function test.  Your physician has requested that you have a lexiscan myoview. For further information please visit [email or web address] Please follow instruction sheet, as given.  Your physician recommends that you schedule a follow-up appointment in  2-3 weeks after both test

## 2013-04-14 NOTE — Progress Notes (Signed)
OFFICE NOTE  Chief Complaint:  Dyspnea on exertion  Primary Care Physician: Dow Adolph, MD  HPI:  [name] is a pleasant 79 year old male who is referred to me as a new patient for evaluation of shortness of breath with exertion. He has very little past medical history other than a courier surgery in the 1980. He has had hypertension and is on medication for that. This past summer he underwent an echocardiogram which showed preserved systolic function, mild aortic stenosis and mild to moderate mitral regurgitation. He was having dyspnea and a murmur was noted at that time. He has never had a cardiovascular workup, but does have a long-standing history of smoking, and fact he smoked about 3 packs a day for 40 years. He however quit 20 years ago. He used to work at the [employer].  In addition he had a significant alcohol use in the past but has not drank in many years.  He denies any anginal chest pain and there is no family history of heart disease that he has reported. His dyspnea certainly worse with exercise and/or exertion, particularly walking longer distances or up stairs.  PMHx:  Past Medical History  Diagnosis Date  . BPH (benign prostatic hyperplasia)   . Shortness of breath     with exertion    Past Surgical History  Procedure Laterality Date  . Ercp N/A 11/27/2012    Procedure: ENDOSCOPIC RETROGRADE CHOLANGIOPANCREATOGRAPHY (ERCP);  Surgeon: Louis Meckel, MD;  Location: [facility];  Service: Endoscopy;  Laterality: N/A;  . Eye surgery Right   . Cholecystectomy  07/04/2010  . Prostate surgery      Dr. Su Grand    FAMHx:  Family History  Problem Relation Age of Onset  . Diabetes Sister   . Cancer Sister   . Cancer Mother   . Cancer Father   . Cancer Sister     SOCHx:   reports that he quit smoking about 32 years ago. His smoking use included Cigarettes. He smoked 0.00 packs per day. He has never used smokeless tobacco. He reports that he does  not drink alcohol or use illicit drugs.  ALLERGIES:  No Known Allergies  ROS: A comprehensive review of systems was negative except for: Respiratory: positive for dyspnea on exertion Cardiovascular: positive for murmur  HOME MEDS: Current Outpatient Prescriptions  Medication Sig Dispense Refill  . lisinopril-hydrochlorothiazide (PRINZIDE,ZESTORETIC) 10-12.5 MG per tablet Take 1 tablet by mouth daily.  90 tablet  3   No current facility-administered medications for this visit.    LABS/IMAGING: No results found for this or any previous visit (from the past 48 hour(s)). No results found.  VITALS: BP 168/88  Pulse 74  Ht 5\' 5"  (1.651 m)  Wt 178 lb 1.6 oz (80.786 kg)  BMI 29.64 kg/m2  EXAM: General appearance: alert and no distress, markedly poor dentition Neck: no carotid bruit and no JVD Lungs: clear to auscultation bilaterally Heart: regular rate and rhythm, S1, S2 normal, systolic murmur: early systolic 3/6, crescendo at 2nd right intercostal space and 2nd systolic murmur: holosystolic 3/6, blowing at apex Abdomen: soft, non-tender; bowel sounds normal; no masses,  no organomegaly Extremities: extremities normal, atraumatic, no cyanosis or edema Pulses: 1+ DP/PT pulses Skin: Skin color, texture, turgor normal. No rashes or lesions Neurologic: Grossly normal Psych: Mood, affect normal  EKG: Normal sinus rhythm at 74, incomplete right bundle branch block, left anterior fascicular block, minimal criteria for LVH, abnormal EKG  ASSESSMENT: 1. Progressive dyspnea  on exertion 2. Long-standing smoking history (120 pack years) - quit 20 years ago 3. Valvular heart disease including mild aortic stenosis and mild to moderate mitral regurgitation 4. Hypertension-not at goal  PLAN: 1.   Mr. Goth has had some progressive dyspnea on exertion, which could be an anginal equivalent. He has a number of risk factors including long-standing smoking history, valvular heart disease, and  hypertension which is not adequately controlled. Unfortunately he is unable to walk on a treadmill given problems with his knees, therefore recommend a LexiScan nuclear stress test. In addition, he should undergo full pulmonary function tests as his shortness of breath could be related to COPD. I do not believe his valvular heart disease is causing significant decompensation, but I suspect it is a surrogate for underlying CAD.  Plan to see him back in followup of his studies in a few weeks.  [name], MD, [facility] Attending Cardiologist [facility]  HILTY,Kenneth C 04/14/2013, 3:25 PM

## 2013-04-17 ENCOUNTER — Telehealth: Payer: Self-pay | Admitting: *Deleted

## 2013-04-17 NOTE — Telephone Encounter (Signed)
Patient returned the call. Informed patient of PFT APPOINTMENT at [facility]. He request a letter be sent to him,because he stated does not remember well. RN will mail letter.  Patient also stated he wants to cancel his  myoview on 04/22/13 because he received a call stating that he will need a driver for the test. Patient states he does not have anyone until 04/30/13. Will defer to scheduler -to reschedule and contact patient

## 2013-04-17 NOTE — Telephone Encounter (Signed)
Left message to call back--PFT APPOINTMENT.

## 2013-04-17 NOTE — Telephone Encounter (Signed)
Message copied by Tobin Chad on Thu Apr 17, 2013  4:36 PM ------      Message from: [place], [place] P      Created: Thu Apr 17, 2013  8:20 AM      Regarding: PFT Order       PFT for [name] has been scheduled at [facility] for November 25th at 3pm with 2:45pm arrival time.  Please instruct patient to enter hospital on [address] through main entrance A and to register with admitting.  If this appointment day/time does not work for patient, please call [phone] to reschedule.            Thank you.       ------

## 2013-04-22 ENCOUNTER — Encounter (HOSPITAL_COMMUNITY): Payer: Medicare Other

## 2013-04-29 ENCOUNTER — Ambulatory Visit (HOSPITAL_COMMUNITY)
Admission: RE | Admit: 2013-04-29 | Discharge: 2013-04-29 | Disposition: A | Discharge status: Home / Self Care | Payer: Medicare Other | Source: Ambulatory Visit | Attending: Internal Medicine | Admitting: Internal Medicine

## 2013-04-29 DIAGNOSIS — F17200 Nicotine dependence, unspecified, uncomplicated: Secondary | ICD-10-CM | POA: Insufficient documentation

## 2013-04-29 DIAGNOSIS — R0602 Shortness of breath: Secondary | ICD-10-CM

## 2013-04-29 MED ORDER — ALBUTEROL SULFATE (5 MG/ML) 0.5% IN NEBU
2.5000 mg | INHALATION_SOLUTION | Freq: Once | RESPIRATORY_TRACT | Status: AC
  Administered 2013-04-29: 2.5 mg via RESPIRATORY_TRACT

## 2013-05-05 DIAGNOSIS — I739 Peripheral vascular disease, unspecified: Secondary | ICD-10-CM

## 2013-05-05 HISTORY — DX: Peripheral vascular disease, unspecified: I73.9

## 2013-05-05 HISTORY — PX: NM MYOVIEW LTD: HXRAD82

## 2013-05-06 ENCOUNTER — Ambulatory Visit (HOSPITAL_COMMUNITY)
Admission: RE | Admit: 2013-05-06 | Discharge: 2013-05-06 | Disposition: A | Discharge status: Home / Self Care | Payer: Medicare Other | Source: Ambulatory Visit | Attending: Internal Medicine | Admitting: Internal Medicine

## 2013-05-06 ENCOUNTER — Ambulatory Visit (INDEPENDENT_AMBULATORY_CARE_PROVIDER_SITE_OTHER): Payer: Medicare Other | Admitting: Cardiology

## 2013-05-06 ENCOUNTER — Encounter: Payer: Self-pay | Admitting: Cardiology

## 2013-05-06 VITALS — BP 147/92 | HR 72 | Ht 65.0 in | Wt 177.5 lb

## 2013-05-06 DIAGNOSIS — I739 Peripheral vascular disease, unspecified: Secondary | ICD-10-CM

## 2013-05-06 DIAGNOSIS — R0609 Other forms of dyspnea: Secondary | ICD-10-CM | POA: Insufficient documentation

## 2013-05-06 DIAGNOSIS — F17200 Nicotine dependence, unspecified, uncomplicated: Secondary | ICD-10-CM

## 2013-05-06 DIAGNOSIS — I10 Essential (primary) hypertension: Secondary | ICD-10-CM | POA: Insufficient documentation

## 2013-05-06 DIAGNOSIS — Z87891 Personal history of nicotine dependence: Secondary | ICD-10-CM | POA: Insufficient documentation

## 2013-05-06 DIAGNOSIS — R0602 Shortness of breath: Secondary | ICD-10-CM

## 2013-05-06 DIAGNOSIS — E663 Overweight: Secondary | ICD-10-CM | POA: Insufficient documentation

## 2013-05-06 DIAGNOSIS — R011 Cardiac murmur, unspecified: Secondary | ICD-10-CM

## 2013-05-06 DIAGNOSIS — R0989 Other specified symptoms and signs involving the circulatory and respiratory systems: Secondary | ICD-10-CM | POA: Insufficient documentation

## 2013-05-06 DIAGNOSIS — R9431 Abnormal electrocardiogram [ECG] [EKG]: Secondary | ICD-10-CM

## 2013-05-06 MED ORDER — REGADENOSON 0.4 MG/5ML IV SOLN
0.4000 mg | Freq: Once | INTRAVENOUS | Status: AC
  Administered 2013-05-06: 0.4 mg via INTRAVENOUS

## 2013-05-06 MED ORDER — TECHNETIUM TC 99M SESTAMIBI GENERIC - CARDIOLITE
10.8000 | Freq: Once | INTRAVENOUS | Status: AC | PRN
  Administered 2013-05-06: 11 via INTRAVENOUS

## 2013-05-06 MED ORDER — TECHNETIUM TC 99M SESTAMIBI GENERIC - CARDIOLITE
30.9000 | Freq: Once | INTRAVENOUS | Status: AC | PRN
  Administered 2013-05-06: 30.9 via INTRAVENOUS

## 2013-05-06 NOTE — Procedures (Addendum)
[facility] [address] [place] [address] [phone]  Cardiology Nuclear Med [name]  [name] is a 79 y.o. male     MRN : [id number]     DOB: December 30, 1929  Procedure Date: 05/06/2013  Nuclear Med Background Indication for Stress Test:  Evaluation for Ischemia and Abnormal EKG History:  AVS, MV Regurg murmur Cardiac Risk Factors: History of Smoking, Hypertension and Overweight  Symptoms:  DOE   Nuclear Pre-Procedure Caffeine/Decaff Intake:  7:00pm NPO After: 5:00am   IV Site: R Antecubital  IV 0.9% NS with Angio Cath:  22g  Chest Size (in):  44 IV Started by: [name], CNMT  Height:  5'5"  Cup Size: n/a  BMI:  There is no weight on file to calculate BMI. Weight:   178 lb   Tech Comments:  n/a    Nuclear Med Study 1 or 2 day study: 1 day  Stress Test Type:  Lexiscan  Order Authorizing Provider:  Zoila Shutter, MD   Resting Radionuclide: Technetium 22m Sestamibi  Resting Radionuclide Dose: 10.8 mCi   Stress Radionuclide:  Technetium 56m Sestamibi  Stress Radionuclide Dose: 30.9 mCi           Stress Protocol Rest HR: 60 Stress HR: 70  Rest BP: 179/95 Stress BP: 182/92  Exercise Time (min): n/a METS: n/a   Predicted Max HR: 139 bpm % Max HR: 61.87 bpm Rate Pressure Product: 21308  Dose of Adenosine (mg):  n/a Dose of Lexiscan: 0.4 mg  Dose of Atropine (mg): n/a Dose of Dobutamine: n/a mcg/kg/min (at max HR)  Stress Test Technologist: [name], CCT Nuclear Technologist: [name], CNMT   Rest Procedure:  Myocardial perfusion imaging was performed at rest 45 minutes following the intravenous administration of Technetium 43m Sestamibi. Stress Procedure:  The patient received IV Lexiscan 0.4 mg over 15-seconds.  Technetium 73m Sestamibi injected at 30-seconds.  There were no significant changes with Lexiscan.  Quantitative spect images were obtained after a 45 minute delay.  Transient  Ischemic Dilatation (Normal <1.22):  0.89 Lung/Heart Ratio (Normal <0.45):  0.25 QGS EDV:  101 ml QGS ESV:  45 ml LV Ejection Fraction: 55%  Rest ECG: NSR - Normal EKG  Stress ECG: No significant change from baseline ECG  QPS Raw Data Images:  Normal; no motion artifact; normal heart/lung ratio. Stress Images:  There is decreased uptake in the apex. Rest Images:  There is decreased uptake in the apex. Subtraction (SDS):  No evidence of ischemia.  Impression Exercise Capacity:  Lexiscan with no exercise. BP Response:  Normal blood pressure response. Clinical Symptoms:  No symptoms. ECG Impression:  No significant ECG changes with Lexiscan. Comparison with Prior Nuclear Study: No previous nuclear study performed  Overall Impression:  Low risk stress nuclear study small fixed apical thinning artifact. No ischemia.  LV Wall Motion:  NL LV Function; NL Wall Motion; EF 55%.  [name], MD, [facility] Board Certified in Nuclear Cardiology Attending Cardiologist [facility]  [name], MD  05/06/2013 12:35 PM

## 2013-05-06 NOTE — Patient Instructions (Signed)
Your physician has requested that you have a lower extremity arterial exercise duplex. During this test, exercise and ultrasound are used to evaluate arterial blood flow in the legs. Allow one hour for this exam. There are no restrictions or special instructions.  Dr. Rennis Golden recommends that you schedule a follow-up appointment in: 3 months

## 2013-05-06 NOTE — Progress Notes (Signed)
PATIENT: [name] MRN: [id number]  DOB: 1930/04/14   DOV:05/08/2013 PCP: David Adolph, MD  Clinic Note: Chief Complaint  Patient presents with  . Follow-up    Nuclear Study results:  No chest pain, edema or dizziness.  He does c/o SOB w/bending over to tie shoes or exerts himself.   HPI: [name] is a 79 y.o.  male with a PMH below who presents today for post stress test and echocardiogram evaluation. He was seen by [name] on November 10th, and somehow was placed on my schedule. I agreed to see him to avoid any chance of upsetting the patient has come in for this visit. I have discussed my plans with [name] who was also at the clinic today. [name] was concerned about her progressive worsening dyspnea on exertion the patient was having is a possible angina equivalent. He has just had a LexiScan nuclear stress test that was read by [name] this morning. The study was read as low risk with a small fixed defect in the apex that is thought to be due to apical thinning. There is no evidence of ischemia in the EF was preserved at 55%. He had an Echocardiogram done back in June of this year that revealed septal thickening with moderate LVH. EF was 65% with normal wall motion. There is diastolic dysfunction noted. There is aortic sclerosis but no stenosis. There is mild to moderate mitral regurgitation.  Interval History: [name] continues to note exertional dyspnea that I agree with [name] sounds to be somewhat progressive and getting worse. He has had PFTs done, but not read. This could be due to COPD also. He denies any true chest discomfort with rest surgery she does have exertional dyspnea that limits him to walk more than ~200 feet. It is unclear what is more limiting to him however, because he notes pretty significant calf pain worse in the right than the left with ambulation. It would seem to me that his leg is worse. He cites an episode of last week minus car broke  down and he had to walk about 3 blocks to be shot to get parts to fix his car. He said he has significant pain in the right calf and was extremely short of breath. He had to stop several times all the way, even for a short distance.Marland Kitchen  He notes having occasional night cramps.  He says that most of these episodes started several years ago treated and extensive, difficult day of yard work or he was moving heavy materials with a wheelbarrow and what seemed like a pulled muscle in his leg. Since then he's been having these symptoms.   He says he doesn't try to walk for exercise because he tires out too fast. He says he gets short of breath a as well. This event occurs while riding a stationary bike. He says he gets the leg pain doing that as well.   The remainder of Cardiovascular ROS: positive for - dyspnea on exertion and claudication negative for - chest pain, edema, irregular heartbeat, loss of consciousness, murmur, orthopnea, palpitations, paroxysmal nocturnal dyspnea or rapid heart rate: Additional cardiac review of systems: Lightheadedness - no, dizziness - no, syncope/near-syncope - no; TIA/amaurosis fugax - no Melena - no, hematochezia no; hematuria - no; nosebleeds - no; claudication - yes  Past Medical History  Diagnosis Date  . BPH (benign prostatic hyperplasia)   . Exertional dyspnea     with exertion  . Hypertension   .  Mitral valvular regurgitation June 2014    Mild to moderate MR on echocardiogram    Prior Cardiac Evaluation and Past Surgical History: Past Surgical History  Procedure Laterality Date  . Ercp N/A 11/27/2012    Procedure: ENDOSCOPIC RETROGRADE CHOLANGIOPANCREATOGRAPHY (ERCP);  Surgeon: David Meckel, MD;  Location: [facility];  Service: Endoscopy;  Laterality: N/A;  . Eye surgery Right   . Cholecystectomy  07/04/2010  . Prostate surgery      Dr. Su [name]  . Nm myoview ltd  December 2014    Apical thinning but no ischemia. EF 55%  . Transthoracic  echocardiogram  June 2014    Basal septal thickening with moderate LVH. EF 65%. Normal wall motion. Diastolic dysfunction/NOS. Aortic sclerosis without stenosis. Mild to moderate MR    No Known Allergies  Current Outpatient Prescriptions  Medication Sig Dispense Refill  . lisinopril-hydrochlorothiazide (PRINZIDE,ZESTORETIC) 10-12.5 MG per tablet Take 1 tablet by mouth daily.  90 tablet  3   No current facility-administered medications for this visit.    History   Social History Narrative   He is originally from [place]. He has a history of long-standing tobacco use but quit several years ago. He also has a history of former alcohol abuse as well.   He is retired and currently single.    ROS: A comprehensive Review of Systems - Negative except Symprtoms note above.    PHYSICAL EXAM BP 147/92  Pulse 72  Ht 5\' 5"  (1.651 m)  Wt 80.513 kg (177 lb 8 oz)  BMI 29.54 kg/m2 General appearance: alert and no distress, markedly poor dentition; normal mood and affect. Well-nourished well-groomed. Neck: no carotid bruit and no JVD  Lungs: CTAB, nonlabored and good air movement. Heart: RRR. Normal S1-S2., 2/6 crescendo decrescendo SEM at RUSB; 3/6 blowing HSM at apex  Abdomen: soft, non-tender; bowel sounds normal; no masses, no organomegaly  Extremities: extremities normal, atraumatic, no cyanosis or edema  Pulses: Trace-1+ DP/PT pulses  Skin: Skin color, texture, turgor normal. No rashes or lesions  Neurologic: Grossly normal   AVW:UJWJXBJYN today: No  Recent Labs: None  ASSESSMENT / PLAN: Claudication - LEA Dopplers Dyspnea - PFTs  DOE (dyspnea on exertion) Clearly multifactorial, however cannot exclude a possible cardiac etiology. I don't think it MR is quite significant enough, however he does have LVH and quite clearly would have some diastolic function associated with it. This in conjunction with COPD could explain his dyspnea. However I'm concerned with the rapidity and  aggressiveness of it. I will elicit the results of the PFTs are, to see if he may benefit from any COPD treatment.  I discussed it with [name], and I still think that despite the negative scintigraphic images for ischemia, he may still have multivessel CAD given his risk factors of family history of coronary disease, smoker who now has hypertension on exam.  As he will be following up with [name] at this visit, I will defer to his judgment as to whether or not we would consider invasive cardiac evaluation if no other etiology is determined adequate to explain his symptoms.   Valvular heart disease He does have some mitral regurgitation present on exam and noted on echocardiogram. The exam finding would suggest it being perhaps more significant than mild and more moderate. This will need to be monitored, but would not warrant TEE to evaluate for possible mitral valve repair at this time.  Claudication of calf muscles Perhaps more limiting than his dyspnea is his  leg pain with walking. The description sounds quite classic for claudication, and he also has a clear risk factors for peripheral vascular disease. He also has poor distal pulses which are corroborated that.  Plan: Low Extremity Arterial Dopplers with ABIs.  If abnormal, and there would be a thought of performing peripheral angiography, my consideration would be to do coronary angiography the same time.  Essential hypertension Borderline, yet adequate control on combination of lisinopril/HCTZ. Simply has this cramping, I would not 1 increase back particular medication with the HCTZ any further than this. We could perhaps increase the ACE inhibitor component to 20 mg if his blood pressure continues to be elevated on followup.   Orders Placed This Encounter  Procedures  . Lower Extremity Arterial Duplex    Standing Status: Future     Number of Occurrences:      Standing Expiration Date: 05/06/2014    Order Specific Question:  Where  should this test be performed:    Answer:  [facility]   No orders of the defined types were placed in this encounter.    Followup: 3 months with [name]  Quillan Whitter W. [name], M.D., M.S. [facility] [address] [place], [place]  [address]  [phone] Pager # [phone]

## 2013-05-08 ENCOUNTER — Encounter: Payer: Self-pay | Admitting: Cardiology

## 2013-05-08 DIAGNOSIS — I10 Essential (primary) hypertension: Secondary | ICD-10-CM | POA: Insufficient documentation

## 2013-05-08 HISTORY — DX: Essential (primary) hypertension: I10

## 2013-05-08 NOTE — Assessment & Plan Note (Signed)
Perhaps more limiting than his dyspnea is his leg pain with walking. The description sounds quite classic for claudication, and he also has a clear risk factors for peripheral vascular disease. He also has poor distal pulses which are corroborated that.  Plan: Low Extremity Arterial Dopplers with ABIs.  If abnormal, and there would be a thought of performing peripheral angiography, my consideration would be to do coronary angiography the same time.

## 2013-05-08 NOTE — Assessment & Plan Note (Signed)
Clearly multifactorial, however cannot exclude a possible cardiac etiology. I don't think it MR is quite significant enough, however he does have LVH and quite clearly would have some diastolic function associated with it. This in conjunction with COPD could explain his dyspnea. However I'm concerned with the rapidity and aggressiveness of it. I will elicit the results of the PFTs are, to see if he may benefit from any COPD treatment.  I discussed it with Dr. Rennis Golden, and I still think that despite the negative scintigraphic images for ischemia, he may still have multivessel CAD given his risk factors of family history of coronary disease, smoker who now has hypertension on exam.  As he will be following up with Dr. Rennis Golden at this visit, I will defer to his judgment as to whether or not we would consider invasive cardiac evaluation if no other etiology is determined adequate to explain his symptoms.

## 2013-05-08 NOTE — Assessment & Plan Note (Addendum)
He does have some mitral regurgitation present on exam and noted on echocardiogram. The exam finding would suggest it being perhaps more significant than mild and more moderate. This will need to be monitored, but would not warrant TEE to evaluate for possible mitral valve repair at this time.

## 2013-05-08 NOTE — Assessment & Plan Note (Signed)
Borderline, yet adequate control on combination of lisinopril/HCTZ. Simply has this cramping, I would not 1 increase back particular medication with the HCTZ any further than this. We could perhaps increase the ACE inhibitor component to 20 mg if his blood pressure continues to be elevated on followup.

## 2013-05-20 ENCOUNTER — Ambulatory Visit (HOSPITAL_COMMUNITY)
Admission: RE | Admit: 2013-05-20 | Discharge: 2013-05-20 | Disposition: A | Discharge status: Home / Self Care | Payer: Medicare Other | Source: Ambulatory Visit | Attending: Cardiovascular Disease | Admitting: Cardiovascular Disease

## 2013-05-20 DIAGNOSIS — I739 Peripheral vascular disease, unspecified: Secondary | ICD-10-CM

## 2013-05-20 NOTE — Progress Notes (Signed)
Lower Extremity Arterial Duplex Completed. °Brianna L Mazza,RVT °

## 2013-05-27 LAB — PULMONARY FUNCTION TEST
DL/VA % pred: 103 %
DLCO cor % pred: 65 %
DLCO cor: 16.88 ml/min/mmHg
DLCO unc % pred: 65 %
FEF 25-75 Post: 1.49 L/sec
FEF 25-75 Pre: 0.8 L/sec
FEF2575-%Change-Post: 86 %
FEF2575-%Pred-Post: 96 %
FEF2575-%Pred-Pre: 51 %
FEV1-%Pred-Pre: 57 %
FEV1-Pre: 1.18 L
FEV1FVC-%Pred-Pre: 69 %
FEV6-%Change-Post: 10 %
FEV6-%Pred-Post: 90 %
FVC-%Change-Post: 10 %
FVC-%Pred-Post: 88 %
FVC-Post: 2.55 L
Post FEV1/FVC ratio: 68 %
Pre FEV1/FVC ratio: 51 %
RV: 2.13 L
TLC % pred: 72 %

## 2013-06-01 ENCOUNTER — Encounter: Payer: Self-pay | Admitting: Cardiology

## 2013-06-02 NOTE — Progress Notes (Signed)
LMTCB

## 2013-06-03 ENCOUNTER — Telehealth: Payer: Self-pay | Admitting: Cardiovascular Disease

## 2013-06-03 ENCOUNTER — Telehealth: Payer: Self-pay | Admitting: *Deleted

## 2013-06-03 DIAGNOSIS — R0600 Dyspnea, unspecified: Secondary | ICD-10-CM

## 2013-06-03 DIAGNOSIS — R942 Abnormal results of pulmonary function studies: Secondary | ICD-10-CM

## 2013-06-03 NOTE — Telephone Encounter (Signed)
Message forwarded to J. Elkins, RN.  

## 2013-06-03 NOTE — Telephone Encounter (Signed)
Spoke with patient. Provided test results and informed that Dr. Rennis Golden would like patient to see pulmonologist, Dr. Curley Spice. Patient agreed with plan. Referral ordered.

## 2013-06-03 NOTE — Telephone Encounter (Signed)
Called patient to inform of pulmonology referral per Dr. Rennis Golden. Patient agreed with plan. Referral ordered.

## 2013-06-03 NOTE — Telephone Encounter (Signed)
Returning somebody call from yesterday,he was confused with the name of the person who called.

## 2013-06-13 ENCOUNTER — Encounter: Payer: Self-pay | Admitting: Cardiovascular Disease

## 2013-06-13 ENCOUNTER — Ambulatory Visit (INDEPENDENT_AMBULATORY_CARE_PROVIDER_SITE_OTHER): Payer: Medicare Other | Admitting: Cardiovascular Disease

## 2013-06-13 VITALS — BP 140/80 | HR 70 | Ht 65.0 in | Wt 183.0 lb

## 2013-06-13 DIAGNOSIS — R5381 Other malaise: Secondary | ICD-10-CM

## 2013-06-13 DIAGNOSIS — D689 Coagulation defect, unspecified: Secondary | ICD-10-CM

## 2013-06-13 DIAGNOSIS — Z79899 Other long term (current) drug therapy: Secondary | ICD-10-CM

## 2013-06-13 DIAGNOSIS — R0602 Shortness of breath: Secondary | ICD-10-CM

## 2013-06-13 DIAGNOSIS — I739 Peripheral vascular disease, unspecified: Secondary | ICD-10-CM

## 2013-06-13 DIAGNOSIS — Z01818 Encounter for other preprocedural examination: Secondary | ICD-10-CM

## 2013-06-13 DIAGNOSIS — R5383 Other fatigue: Secondary | ICD-10-CM

## 2013-06-13 NOTE — Patient Instructions (Signed)
Dr. Gwenlyn Found has ordered a cardiac and peripheral angiogram to be done at [facility].  This procedure is going to look at the bloodflow in your lower extremities.  If Dr. Gwenlyn Found is able to open up the arteries, you will have to spend one night in the hospital.  If he is not able to open the arteries, you will be able to go home that same day.    After the procedure, you will not be allowed to drive for 3 days or push, pull, or lift anything greater than 10 lbs for one week.    You will be required to have bloodwork and a chest xray prior to your procedure.  Our scheduler will advise you on when these items need to be done.    REPS: Scott and Tara Left groin

## 2013-06-13 NOTE — Progress Notes (Signed)
06/13/2013 [name]   1929/09/08  [id number]  Primary Physician David Lennox, MD Primary Cardiologist: David Harp MD [name]   HPI:  [name] is a 80 y.o. male with a PMH below who presents today for post stress test and echocardiogram evaluation. He was seen by Dr. Debara [name] on November 10th, and somehow was placed on my schedule. I agreed to see him to avoid any chance of upsetting the patient has come in for this visit. I have discussed my plans with Dr. Debara [name] who was also at the clinic today.  Dr. Debara [name] was concerned about her progressive worsening dyspnea on exertion the patient was having is a possible angina equivalent. He has just had a LexiScan nuclear stress test that was read by Dr. Debara [name] this morning. The study was read as low risk with a small fixed defect in the apex that is thought to be due to apical thinning. There is no evidence of ischemia in the EF was preserved at 55%.  He had an Echocardiogram done back in June of this year that revealed septal thickening with moderate LVH. EF was 65% with normal wall motion. There is diastolic dysfunction noted. There is aortic sclerosis but no stenosis. There is mild to moderate mitral regurgitation. He was sent to me by Dr. Glenetta [name] for vasodilation. He does complain of claudication over the last 3 years right greater than left with some Doppler studies that suggest this a right ABI 0.29 with occluded but as the day and popliteal arteries as well as a left ABI of 0.66 with a high-grade lesion in the proximal left SFA. We'll proceed with lobectomy angiography and potential intervention as well as coronary angiography to rule out an ischemic etiology to his dyspnea.    Current Outpatient Prescriptions  Medication Sig Dispense Refill  . lisinopril-hydrochlorothiazide (PRINZIDE,ZESTORETIC) 10-12.5 MG per tablet Take 1 tablet by mouth daily.  90 tablet  3   No current facility-administered medications  for this visit.    No Known Allergies  History   Social History  . Marital Status: Single    Spouse Name: N/A    Number of Children: N/A  . Years of Education: N/A   Occupational History  . Retired    Social History Main Topics  . Smoking status: Former Smoker    Types: Cigarettes    Quit date: 11/25/1980  . Smokeless tobacco: Never Used  . Alcohol Use: No  . Drug Use: No  . Sexual Activity: Not on file   Other Topics Concern  . Not on file   Social History Narrative   He is originally from [place]. He has a history of long-standing tobacco use but quit several years ago. He also has a history of former alcohol abuse as well.   He is retired and currently single.     Review of Systems: General: negative for chills, fever, night sweats or weight changes.  Cardiovascular: negative for chest pain, dyspnea on exertion, edema, orthopnea, palpitations, paroxysmal nocturnal dyspnea or shortness of breath Dermatological: negative for rash Respiratory: negative for cough or wheezing Urologic: negative for hematuria Abdominal: negative for nausea, vomiting, diarrhea, bright red blood per rectum, melena, or hematemesis Neurologic: negative for visual changes, syncope, or dizziness All other systems reviewed and are otherwise negative except as noted above.    Blood pressure 140/80, pulse 70, height 5\' 5"  (1.651 m), weight 183 lb (83.008 kg).  General appearance: alert and no distress Neck: no  adenopathy, no carotid bruit, no JVD, supple, symmetrical, trachea midline and thyroid not enlarged, symmetric, no tenderness/mass/nodules Lungs: clear to auscultation bilaterally Heart: soft outflow tract murmur Abdomen: soft, non-tender; bowel sounds normal; no masses,  no organomegaly Extremities: extremities normal, atraumatic, no cyanosis or edema and 2+ femorals without bruits. Absent pedal pulses  EKG not performed today  ASSESSMENT AND PLAN:   PAD (peripheral artery  disease) Her to me by Dr. [name] for peripheral vascular evaluation. He has a history of right greater than left lower extremity calf claudication over the last 80 years male without limiting. He has recent lower extremity arterial Doppler studies performed 05/20/13 revealing a right ABI 0.29 with occluded right SFA popliteal artery as well with one vessel runoff via the dorsalis pedis. His left ABI is 0.66 with a high-frequency signal signal at the origin of his left SFA. He's had a low-risk Myoview in the recent past with apical thinning of progressive dyspnea. I'm going to perform abdominal aortography with bilateral runoff and potential endovascular therapy of his lower extremities. At the same time and could perform coronary angiography to rule out ischemic etiology for his dyspnea. I thoroughly explained the risks and benefits of the procedures and he agrees to proceed.      [name] J. David Tiedt MD FACP,FACC,FAHA, FSCAI 06/13/2013 9:43 AM  

## 2013-06-13 NOTE — Assessment & Plan Note (Signed)
Her to me by Dr. Glenetta Hew for peripheral vascular evaluation. He has a history of right greater than left lower extremity calf claudication over the last 80 years male without limiting. He has recent lower extremity arterial Doppler studies performed 05/20/13 revealing a right ABI 0.29 with occluded right SFA popliteal artery as well with one vessel runoff via the dorsalis pedis. His left ABI is 0.66 with a high-frequency signal signal at the origin of his left SFA. He's had a low-risk Myoview in the recent past with apical thinning of progressive dyspnea. I'm going to perform abdominal aortography with bilateral runoff and potential endovascular therapy of his lower extremities. At the same time and could perform coronary angiography to rule out ischemic etiology for his dyspnea. I thoroughly explained the risks and benefits of the procedures and he agrees to proceed.

## 2013-06-30 ENCOUNTER — Encounter (HOSPITAL_COMMUNITY): Payer: Self-pay | Admitting: Pharmacy Technician

## 2013-07-01 ENCOUNTER — Ambulatory Visit
Admission: RE | Admit: 2013-07-01 | Discharge: 2013-07-01 | Disposition: A | Discharge status: Home / Self Care | Payer: Medicare Other | Source: Ambulatory Visit | Attending: Cardiovascular Disease | Admitting: Cardiovascular Disease

## 2013-07-01 DIAGNOSIS — R0602 Shortness of breath: Secondary | ICD-10-CM

## 2013-07-01 LAB — BASIC METABOLIC PANEL
BUN: 17 mg/dL (ref 6–23)
CHLORIDE: 102 meq/L (ref 96–112)
CO2: 33 meq/L — AB (ref 19–32)
Calcium: 9.7 mg/dL (ref 8.4–10.5)
Creat: 0.89 mg/dL (ref 0.50–1.35)
Glucose, Bld: 94 mg/dL (ref 70–99)
Potassium: 3.9 mEq/L (ref 3.5–5.3)
SODIUM: 139 meq/L (ref 135–145)

## 2013-07-01 LAB — PROTIME-INR
INR: 1.07 (ref <1.50)
Prothrombin Time: 13.8 seconds (ref 11.6–15.2)

## 2013-07-01 LAB — APTT: aPTT: 34 seconds (ref 24–37)

## 2013-07-02 LAB — CBC
HEMATOCRIT: 42.6 % (ref 39.0–52.0)
HEMOGLOBIN: 14.3 g/dL (ref 13.0–17.0)
MCH: 29.5 pg (ref 26.0–34.0)
MCHC: 33.6 g/dL (ref 30.0–36.0)
MCV: 88 fL (ref 78.0–100.0)
Platelets: 484 10*3/uL — ABNORMAL HIGH (ref 150–400)
RBC: 4.84 MIL/uL (ref 4.22–5.81)
RDW: 14.3 % (ref 11.5–15.5)
WBC: 5.5 10*3/uL (ref 4.0–10.5)

## 2013-07-02 LAB — TSH: TSH: 1.929 u[IU]/mL (ref 0.350–4.500)

## 2013-07-07 ENCOUNTER — Ambulatory Visit (HOSPITAL_COMMUNITY)
Admission: RE | Admit: 2013-07-07 | Discharge: 2013-07-07 | Disposition: A | Discharge status: Home / Self Care | Payer: Medicare Other | Source: Ambulatory Visit | Attending: Cardiovascular Disease | Admitting: Cardiovascular Disease

## 2013-07-07 ENCOUNTER — Encounter (HOSPITAL_COMMUNITY)
Admission: RE | Disposition: A | Discharge status: Home / Self Care | Payer: Medicare Other | Source: Ambulatory Visit | Attending: Cardiovascular Disease

## 2013-07-07 DIAGNOSIS — I10 Essential (primary) hypertension: Secondary | ICD-10-CM

## 2013-07-07 DIAGNOSIS — R011 Cardiac murmur, unspecified: Secondary | ICD-10-CM

## 2013-07-07 DIAGNOSIS — R0609 Other forms of dyspnea: Secondary | ICD-10-CM

## 2013-07-07 DIAGNOSIS — Z01818 Encounter for other preprocedural examination: Secondary | ICD-10-CM

## 2013-07-07 DIAGNOSIS — Z87891 Personal history of nicotine dependence: Secondary | ICD-10-CM | POA: Insufficient documentation

## 2013-07-07 DIAGNOSIS — I739 Peripheral vascular disease, unspecified: Secondary | ICD-10-CM

## 2013-07-07 DIAGNOSIS — I2510 Atherosclerotic heart disease of native coronary artery without angina pectoris: Secondary | ICD-10-CM | POA: Insufficient documentation

## 2013-07-07 DIAGNOSIS — I519 Heart disease, unspecified: Secondary | ICD-10-CM | POA: Insufficient documentation

## 2013-07-07 DIAGNOSIS — I059 Rheumatic mitral valve disease, unspecified: Secondary | ICD-10-CM | POA: Insufficient documentation

## 2013-07-07 DIAGNOSIS — I70219 Atherosclerosis of native arteries of extremities with intermittent claudication, unspecified extremity: Secondary | ICD-10-CM | POA: Insufficient documentation

## 2013-07-07 DIAGNOSIS — I700 Atherosclerosis of aorta: Secondary | ICD-10-CM | POA: Insufficient documentation

## 2013-07-07 HISTORY — PX: ABDOMINAL ANGIOGRAM: SHX5499

## 2013-07-07 HISTORY — PX: LOWER EXTREMITY ANGIOGRAM: SHX5508

## 2013-07-07 HISTORY — PX: LEFT HEART CATHETERIZATION WITH CORONARY ANGIOGRAM: SHX5451

## 2013-07-07 LAB — POCT ACTIVATED CLOTTING TIME: ACTIVATED CLOTTING TIME: 370 s

## 2013-07-07 SURGERY — ANGIOGRAM, LOWER EXTREMITY
Anesthesia: LOCAL

## 2013-07-07 MED ORDER — DIAZEPAM 5 MG PO TABS
5.0000 mg | ORAL_TABLET | ORAL | Status: AC
  Administered 2013-07-07: 5 mg via ORAL

## 2013-07-07 MED ORDER — ACETAMINOPHEN 325 MG PO TABS: 650.0000 mg | ORAL_TABLET | ORAL | Status: DC | PRN

## 2013-07-07 MED ORDER — ADENOSINE 12 MG/4ML IV SOLN
16.0000 mL | Freq: Once | INTRAVENOUS | Status: DC
  Filled 2013-07-07: qty 16

## 2013-07-07 MED ORDER — ASPIRIN 81 MG PO CHEW
81.0000 mg | CHEWABLE_TABLET | ORAL | Status: AC
  Administered 2013-07-07: 81 mg via ORAL

## 2013-07-07 MED ORDER — HEPARIN (PORCINE) IN NACL 2-0.9 UNIT/ML-% IJ SOLN
INTRAMUSCULAR | Status: AC
  Filled 2013-07-07: qty 1000

## 2013-07-07 MED ORDER — BIVALIRUDIN 250 MG IV SOLR
INTRAVENOUS | Status: AC
  Filled 2013-07-07: qty 250

## 2013-07-07 MED ORDER — ASPIRIN 81 MG PO CHEW
81.0000 mg | CHEWABLE_TABLET | Freq: Every day | ORAL | Status: DC
Start: 2013-07-07 — End: 2013-07-07

## 2013-07-07 MED ORDER — DIAZEPAM 5 MG PO TABS
ORAL_TABLET | ORAL | Status: AC
  Administered 2013-07-07: 5 mg via ORAL
  Filled 2013-07-07: qty 1

## 2013-07-07 MED ORDER — SODIUM CHLORIDE 0.9 % IV SOLN
INTRAVENOUS | Status: DC
  Administered 2013-07-07: 09:00:00 via INTRAVENOUS

## 2013-07-07 MED ORDER — SODIUM CHLORIDE 0.9 % IJ SOLN: 3.0000 mL | INTRAMUSCULAR | Status: DC | PRN

## 2013-07-07 MED ORDER — MORPHINE SULFATE 2 MG/ML IJ SOLN: 1.0000 mg | INTRAMUSCULAR | Status: DC | PRN

## 2013-07-07 MED ORDER — SODIUM CHLORIDE 0.9 % IV SOLN: INTRAVENOUS | Status: AC

## 2013-07-07 MED ORDER — ASPIRIN 81 MG PO CHEW
CHEWABLE_TABLET | ORAL | Status: AC
  Administered 2013-07-07: 81 mg via ORAL
  Filled 2013-07-07: qty 1

## 2013-07-07 MED ORDER — LIDOCAINE HCL (PF) 1 % IJ SOLN
INTRAMUSCULAR | Status: AC
  Filled 2013-07-07: qty 30

## 2013-07-07 MED ORDER — HYDRALAZINE HCL 20 MG/ML IJ SOLN: 10.0000 mg | INTRAMUSCULAR | Status: DC

## 2013-07-07 MED ORDER — FENTANYL CITRATE 0.05 MG/ML IJ SOLN
INTRAMUSCULAR | Status: AC
  Filled 2013-07-07: qty 2

## 2013-07-07 MED ORDER — ONDANSETRON HCL 4 MG/2ML IJ SOLN: 4.0000 mg | Freq: Four times a day (QID) | INTRAMUSCULAR | Status: DC | PRN

## 2013-07-07 NOTE — CV Procedure (Signed)
[name] is a 80 y.o. male    [id number] LOCATION:  FACILITY: [facility]  PHYSICIAN: Quay Burow, M.D. Jun 25, 1929   DATE OF PROCEDURE:  07/07/2013  DATE OF DISCHARGE:     PV Angiogram/Intervention    History obtained from chart review.[name] is a 80 y.o. male with a PMH below who presents today for post stress test and echocardiogram evaluation. He was seen by Dr. Debara Pickett on November 10th, and somehow was placed on my schedule. I agreed to see him to avoid any chance of upsetting the patient has come in for this visit. I have discussed my plans with Dr. Debara Pickett who was also at the clinic today.  Dr. Debara Pickett was concerned about her progressive worsening dyspnea on exertion the patient was having is a possible angina equivalent. He has just had a LexiScan nuclear stress test that was read by Dr. Debara Pickett this morning. The study was read as low risk with a small fixed defect in the apex that is thought to be due to apical thinning. There is no evidence of ischemia in the EF was preserved at 55%.  He had an Echocardiogram done back in June of this year that revealed septal thickening with moderate LVH. EF was 65% with normal wall motion. There is diastolic dysfunction noted. There is aortic sclerosis but no stenosis. There is mild to moderate mitral regurgitation.  He was sent to me by Dr. Glenetta Hew for vasodilation. He does complain of claudication over the last 3 years right greater than left with some Doppler studies that suggest this a right ABI 0.29 with occluded but as the day and popliteal arteries as well as a left ABI of 0.66 with a high-grade lesion in the proximal left SFA. We'll proceed with lobectomy angiography and potential intervention as well as coronary angiography to rule out an ischemic etiology to his dyspnea.    PROCEDURE DESCRIPTION:   The patient was brought to the second floor [facility] cath lab in the postabsorptive state. He was premedicated with Valium  5 mg by mouth, IV fentanyl. His right groinwas prepped and shaved in usual sterile fashion. Xylocaine 1% was used for local anesthesia. A 5 French sheath was inserted into the right common femoral artery using standard Seldinger technique. A 5 French pigtail catheter was used for midstream abdominal aortography, distal abdominal aortography, bilateral iliac angiography and bifemoral runoff using bolus chase digital subtraction settable technique. Visipaque dye was used for the entirety of the case. retrograde aortic pressure was monitored during the case.  HEMODYNAMICS:    AO SYSTOLIC/AO DIASTOLIC: 888/91   Angiographic Data:   1: Abdominal aortogram-the renal arteries widely patent. The infrarenal abdominal aorta was free of significant atherosclerotic changes.  2: Left lower extremity-there were tandem proximal and mid left SFA calcified high-grade stenoses. There was one vessel runoff via the peroneal.  3: Right lower extremity-press-fit was occluded at its origin. The only reconstitution was of the peroneal vessel by both profundus femoris and geniculate collaterals.  IMPRESSION:high-grade focal calcified proximal mid left SFA stenosis amenable to diamondback orbital rotational arthrectomy plus or minus PTA and or stenting. The right SFA, popliteal and tibial vessels are occluded. There are no revascularization options for this leg. The sheath Will be removed and pressure was held on the groin to achieve hemostasis. The patient left the lab in stable condition. He'll be hydrated and discharged him as an outpatient. He was given level provider back in one week and a staged  left lower extremity intervention will be arranged.   Lorretta Harp MD, [facility] 07/07/2013 11:32 AM

## 2013-07-07 NOTE — Progress Notes (Signed)
Discharge instruction given per MD order.  Pt was able to verbalize understanding.  Pt to car via wheelchair. 

## 2013-07-07 NOTE — H&P (View-Only) (Signed)
06/13/2013 [name]   1929/09/08  [id number]  Primary Physician Ellsworth Lennox, MD Primary Cardiologist: Lorretta Harp MD Renae Gloss   HPI:  Revis Whalin Dunk is a 80 y.o. male with a PMH below who presents today for post stress test and echocardiogram evaluation. He was seen by Dr. Debara Pickett on November 10th, and somehow was placed on my schedule. I agreed to see him to avoid any chance of upsetting the patient has come in for this visit. I have discussed my plans with Dr. Debara Pickett who was also at the clinic today.  Dr. Debara Pickett was concerned about her progressive worsening dyspnea on exertion the patient was having is a possible angina equivalent. He has just had a LexiScan nuclear stress test that was read by Dr. Debara Pickett this morning. The study was read as low risk with a small fixed defect in the apex that is thought to be due to apical thinning. There is no evidence of ischemia in the EF was preserved at 55%.  He had an Echocardiogram done back in June of this year that revealed septal thickening with moderate LVH. EF was 65% with normal wall motion. There is diastolic dysfunction noted. There is aortic sclerosis but no stenosis. There is mild to moderate mitral regurgitation. He was sent to me by Dr. Glenetta Hew for vasodilation. He does complain of claudication over the last 3 years right greater than left with some Doppler studies that suggest this a right ABI 0.29 with occluded but as the day and popliteal arteries as well as a left ABI of 0.66 with a high-grade lesion in the proximal left SFA. We'll proceed with lobectomy angiography and potential intervention as well as coronary angiography to rule out an ischemic etiology to his dyspnea.    Current Outpatient Prescriptions  Medication Sig Dispense Refill  . lisinopril-hydrochlorothiazide (PRINZIDE,ZESTORETIC) 10-12.5 MG per tablet Take 1 tablet by mouth daily.  90 tablet  3   No current facility-administered medications  for this visit.    No Known Allergies  History   Social History  . Marital Status: Single    Spouse Name: N/A    Number of Children: N/A  . Years of Education: N/A   Occupational History  . Retired    Social History Main Topics  . Smoking status: Former Smoker    Types: Cigarettes    Quit date: 11/25/1980  . Smokeless tobacco: Never Used  . Alcohol Use: No  . Drug Use: No  . Sexual Activity: Not on file   Other Topics Concern  . Not on file   Social History Narrative   He is originally from [place]. He has a history of long-standing tobacco use but quit several years ago. He also has a history of former alcohol abuse as well.   He is retired and currently single.     Review of Systems: General: negative for chills, fever, night sweats or weight changes.  Cardiovascular: negative for chest pain, dyspnea on exertion, edema, orthopnea, palpitations, paroxysmal nocturnal dyspnea or shortness of breath Dermatological: negative for rash Respiratory: negative for cough or wheezing Urologic: negative for hematuria Abdominal: negative for nausea, vomiting, diarrhea, bright red blood per rectum, melena, or hematemesis Neurologic: negative for visual changes, syncope, or dizziness All other systems reviewed and are otherwise negative except as noted above.    Blood pressure 140/80, pulse 70, height 5\' 5"  (1.651 m), weight 183 lb (83.008 kg).  General appearance: alert and no distress Neck: no  adenopathy, no carotid bruit, no JVD, supple, symmetrical, trachea midline and thyroid not enlarged, symmetric, no tenderness/mass/nodules Lungs: clear to auscultation bilaterally Heart: soft outflow tract murmur Abdomen: soft, non-tender; bowel sounds normal; no masses,  no organomegaly Extremities: extremities normal, atraumatic, no cyanosis or edema and 2+ femorals without bruits. Absent pedal pulses  EKG not performed today  ASSESSMENT AND PLAN:   PAD (peripheral artery  disease) Her to me by Dr. Glenetta Hew for peripheral vascular evaluation. He has a history of right greater than left lower extremity calf claudication over the last 80 years male without limiting. He has recent lower extremity arterial Doppler studies performed 05/20/13 revealing a right ABI 0.29 with occluded right SFA popliteal artery as well with one vessel runoff via the dorsalis pedis. His left ABI is 0.66 with a high-frequency signal signal at the origin of his left SFA. He's had a low-risk Myoview in the recent past with apical thinning of progressive dyspnea. I'm going to perform abdominal aortography with bilateral runoff and potential endovascular therapy of his lower extremities. At the same time and could perform coronary angiography to rule out ischemic etiology for his dyspnea. I thoroughly explained the risks and benefits of the procedures and he agrees to proceed.      Lorretta Harp MD FACP,FACC,FAHA, [facility] 06/13/2013 9:43 AM

## 2013-07-07 NOTE — CV Procedure (Signed)
[name] is a 80 y.o. male    [id number] LOCATION:  FACILITY: [facility]  PHYSICIAN: Quay Burow, M.D. 07-04-1929   DATE OF PROCEDURE:  07/07/2013  DATE OF DISCHARGE:     CARDIAC CATHETERIZATION     History obtained from chart review.[name] is a 80 y.o. male with a PMH below who presents today for post stress test and echocardiogram evaluation. He was seen by Dr. Debara Pickett on November 10th, and somehow was placed on my schedule. I agreed to see him to avoid any chance of upsetting the patient has come in for this visit. I have discussed my plans with Dr. Debara Pickett who was also at the clinic today.  Dr. Debara Pickett was concerned about her progressive worsening dyspnea on exertion the patient was having is a possible angina equivalent. He has just had a LexiScan nuclear stress test that was read by Dr. Debara Pickett this morning. The study was read as low risk with a small fixed defect in the apex that is thought to be due to apical thinning. There is no evidence of ischemia in the EF was preserved at 55%.  He had an Echocardiogram done back in June of this year that revealed septal thickening with moderate LVH. EF was 65% with normal wall motion. There is diastolic dysfunction noted. There is aortic sclerosis but no stenosis. There is mild to moderate mitral regurgitation.  He was sent to me by Dr. Glenetta Hew for vasodilation. He does complain of claudication over the last 3 years right greater than left with some Doppler studies that suggest this a right ABI 0.29 with occluded but as the day and popliteal arteries as well as a left ABI of 0.66 with a high-grade lesion in the proximal left SFA. We'll proceed with lobectomy angiography and potential intervention as well as coronary angiography to rule out an ischemic etiology to his dyspnea.    PROCEDURE DESCRIPTION:   The patient was brought to the second floor [facility] cath lab in the postabsorptive state. He Was premedicated with Valium 5  mg by mouth, IV fentanyl. His right groinwas prepped and shaved in usual sterile fashion. Xylocaine 1% was used for local anesthesia. A 5 upgraded to 6 French sheath was inserted into the right common femoral artery using standard Seldinger technique. 5 French right and left Judkins diagnostic catheters along with a 5 French pigtail catheter were used for selective coronary angiography and obtaining LV pressures. Left ventriculography was not performed to conserve contrast. Visipaque dye was used to for the entirety of the case. Retrograde aortic, left ventricular and pulmonary pressures were recorded.  HEMODYNAMICS:    AO SYSTOLIC/AO DIASTOLIC: 683/41   LV SYSTOLIC/LV DIASTOLIC: 962/22  ANGIOGRAPHIC RESULTS:   1. Left main; normal  2. LAD; minor irregularities 3. Left circumflex; nondominant a large a large first obtuse marginal branch and minor irregularities.  4. Right coronary artery; dominant with a 70-80% focal calcified distal stenosis just proximal to the crux. 5. Left ventriculography; was not performed  IMPRESSION:Mr. Alvarenga has a 70-80% focal calcified distal dominant RCA stenosis.open-ended perform FFR interrogation to determine physiologic split significance given his symptoms of dyspnea on exertion of unclear etiology  Procedure description: The existing 5 French sheath was exchanged over a wire for a 6 [place] sheath. The patient received Angiomax bolus with an ACT documented at 370. A total of 282 cc of contrast was administered to the patient. The wire across the lesion and adenosine was administered. The FFR was  demonstrated to be 0.96 suggesting the lesion was not significant. This was reviewed with Dr. Glenetta Hew. Continued medical therapy will be recommended. The sheath will be removed in 2 hours and pressure will be held on the groin. The patient will be hydrated for 4-5 hours and discharged home.  Overall impression: Noncritical CAD with a 80% distal dominant RCA stenosis  with a negative FFR. Medical therapy will be recommended.  Lorretta Harp MD, [facility] 07/07/2013 11:26 AM

## 2013-07-07 NOTE — Discharge Instructions (Signed)
Angiography, Care After °Refer to this sheet in the next few weeks. These instructions provide you with information on caring for yourself after your procedure. Your health care provider may also give you more specific instructions. Your treatment has been planned according to current medical practices, but problems sometimes occur. Call your health care provider if you have any problems or questions after your procedure.  °WHAT TO EXPECT AFTER THE PROCEDURE °After your procedure, it is typical to have the following sensations: °· Minor discomfort or tenderness and a small bump at the catheter insertion site. The bump should usually decrease in size and tenderness within 1 to 2 weeks. °· Any bruising will usually fade within 2 to 4 weeks. °HOME CARE INSTRUCTIONS  °· You may need to keep taking blood thinners if they were prescribed for you. Only take over-the-counter or prescription medicines for pain, fever, or discomfort as directed by your health care provider. °· Do not apply powder or lotion to the site. °· Do not sit in a bathtub, swimming pool, or whirlpool for 5 to 7 days. °· You may shower 24 hours after the procedure. Remove the bandage (dressing) and gently wash the site with plain soap and water. Gently pat the site dry. °· Inspect the site at least twice daily. °· Limit your activity for the first 48 hours. Do not bend, squat, or lift anything over 20 lb (9 kg) or as directed by your health care provider. °· Do not drive home if you are discharged the day of the procedure. Have someone else drive you. Follow instructions about when you can drive or return to work. °SEEK MEDICAL CARE IF: °· You get lightheaded when standing up. °· You have drainage (other than a small amount of blood on the dressing). °· You have chills. °· You have a fever. °· You have redness, warmth, swelling, or pain at the insertion site. °SEEK IMMEDIATE MEDICAL CARE IF:  °· You develop chest pain or shortness of breath, feel faint,  or pass out. °· You have bleeding, swelling larger than a walnut, or drainage from the catheter insertion site. °· You develop pain, discoloration, coldness, or severe bruising in the leg or arm that held the catheter. °· You develop bleeding from any other place, such as the bowels. You may see bright red blood in your urine or stools, or your stools may appear black and tarry. °· You have heavy bleeding from the site. If this happens, hold pressure on the site. °MAKE SURE YOU: °· Understand these instructions. °· Will watch your condition. °· Will get help right away if you are not doing well or get worse. °Document Released: 12/08/2004 Document Revised: 01/22/2013 Document Reviewed: 10/14/2012 °ExitCare® Patient Information ©2014 [employer]. ° °

## 2013-07-07 NOTE — Interval H&P Note (Signed)
Cath Lab Visit (complete for each Cath Lab visit)  Clinical Evaluation Leading to the Procedure:   ACS: no  Non-ACS:    Anginal Classification: CCS II  Anti-ischemic medical therapy: No Therapy  Non-Invasive Test Results: Low-risk stress test findings: cardiac mortality <1%/year  Prior CABG: Previous CABG      History and Physical Interval Note:  07/07/2013 9:59 AM  [name]  has presented today for surgery, with the diagnosis of claudication/cp  The various methods of treatment have been discussed with the patient and family. After consideration of risks, benefits and other options for treatment, the patient has consented to  Procedure(s): LOWER EXTREMITY ANGIOGRAM (N/A) LEFT HEART CATHETERIZATION WITH CORONARY ANGIOGRAM (N/A) as a surgical intervention .  The patient's history has been reviewed, patient examined, no change in status, stable for surgery.  I have reviewed the patient's chart and labs.  Questions were answered to the patient's satisfaction.     Lorretta Harp

## 2013-07-08 MED FILL — Sodium Chloride IV Soln 0.9%: INTRAVENOUS | Qty: 50 | Status: AC

## 2013-07-09 ENCOUNTER — Encounter: Payer: Self-pay | Admitting: *Deleted

## 2013-07-10 ENCOUNTER — Ambulatory Visit: Payer: Medicare Other | Admitting: Cardiology

## 2013-07-17 ENCOUNTER — Encounter: Payer: Self-pay | Admitting: Cardiology

## 2013-07-17 ENCOUNTER — Ambulatory Visit (INDEPENDENT_AMBULATORY_CARE_PROVIDER_SITE_OTHER): Payer: Medicare Other | Admitting: Cardiology

## 2013-07-17 VITALS — BP 110/78 | HR 68 | Ht 65.0 in | Wt 180.8 lb

## 2013-07-17 DIAGNOSIS — I739 Peripheral vascular disease, unspecified: Secondary | ICD-10-CM

## 2013-07-17 DIAGNOSIS — Z79899 Other long term (current) drug therapy: Secondary | ICD-10-CM

## 2013-07-17 DIAGNOSIS — D689 Coagulation defect, unspecified: Secondary | ICD-10-CM

## 2013-07-17 DIAGNOSIS — Z01818 Encounter for other preprocedural examination: Secondary | ICD-10-CM

## 2013-07-17 NOTE — Patient Instructions (Signed)
Dr. Gwenlyn Found has ordered a peripheral angiogram to be done at [facility].  This procedure is going to look at the bloodflow in your lower extremities.  If Dr. Gwenlyn Found is able to open up the arteries, you will have to spend one night in the hospital.  If he is not able to open the arteries, you will be able to go home that same day.    After the procedure, you will not be allowed to drive for 3 days or push, pull, or lift anything greater than 10 lbs for one week.    Our scheduler will advise you on when these items need to be done.   [name] needs to be be there.

## 2013-07-18 ENCOUNTER — Telehealth: Payer: Self-pay | Admitting: Cardiovascular Disease

## 2013-07-18 ENCOUNTER — Encounter: Payer: Self-pay | Admitting: Cardiology

## 2013-07-18 NOTE — Telephone Encounter (Signed)
I spoke with [name] this morning to schedule his peripheral angiogram/PTA that was ordered by Dr. Gwenlyn Found.  Patient states that due to financial reasons, he does not want to schedule at this time.  I told him to call us when he was ready to schedule.

## 2013-07-18 NOTE — Progress Notes (Signed)
Patient ID: [name], male   DOB: 1930-02-25, 80 y.o.   MRN: [id number]  07/18/2013 [name]   [birth year]  [id number]  Primary Physicia David Lennox, MD Primary Cardiologist: Dr. Debara [name]  HPI:  The patient is a 80 y/o male, referred to Dr. Gwenlyn [name] for evaluation of PVD. He recently had a low risk NST. EF was preserved at 55%. He also had an echocardiogram in June 2014 which revealed septal thickening with moderate LVH. EF was 65% with normal wall motion. Diastolic dysfunction was noted. There was aortic sclerosis but no stenosis, as well as mild-moderate mitral regurgitation.    [name] has complained of claudication over the last 3 years right greater than left. Doppler studies suggest a right ABI of 0.29 as well as a left ABI of 0.66 with a high-grade lesion in the proximal left SFA. On 07/07/13, he underwent a diagnostic PV angiogram, performed by Dr. Gwenlyn [name]. He was [name] to have a high-grade focal calcified proximal mid left SFA stenosis. It was felt that this lesion could be amenable to diamondback orbital rotational arthrectomy plus or minus PTA and or stenting. However,the right SFA, popliteal and tibial vessels are occluded. It was felt that there were no revascularization options for his right leg. It was decided to have the patient return for staged procedure on the left. He was discharged home as an outpatient.   He returns today for f/u. He continues to note bilateral lower extremity claudication. He denies any groin, flank or low back pain. No swelling, ecchymosis, erythema, rash or drainage from the femoral access site.     Current Outpatient Prescriptions  Medication Sig Dispense Refill  . lisinopril-hydrochlorothiazide (PRINZIDE,ZESTORETIC) 10-12.5 MG per tablet Take 1 tablet by mouth daily.  90 tablet  3   No current facility-administered medications for this visit.    No Known Allergies  History   Social History  . Marital Status: Single    Spouse Name: N/A   Number of Children: N/A  . Years of Education: N/A   Occupational History  . Retired    Social History Main Topics  . Smoking status: Former Smoker    Types: Cigarettes    Quit date: 11/25/1980  . Smokeless tobacco: Never Used  . Alcohol Use: No  . Drug Use: No  . Sexual Activity: Not on file   Other Topics Concern  . Not on file   Social History Narrative   He is originally from [place]. He has a history of long-standing tobacco use but quit several years ago. He also has a history of former alcohol abuse as well.   He is retired and currently single.     Review of Systems: General: negative for chills, fever, night sweats or weight changes.  Cardiovascular: negative for chest pain, dyspnea on exertion, edema, orthopnea, palpitations, paroxysmal nocturnal dyspnea or shortness of breath Dermatological: negative for rash Respiratory: negative for cough or wheezing Urologic: negative for hematuria Abdominal: negative for nausea, vomiting, diarrhea, bright red blood per rectum, melena, or hematemesis Neurologic: negative for visual changes, syncope, or dizziness All other systems reviewed and are otherwise negative except as noted above.   Blood pressure 110/78, pulse 68, height 5\' 5"  (1.651 m), weight 180 lb 12.8 oz (82.01 kg).  General appearance: alert, cooperative and no distress Lungs: clear to auscultation bilaterally Heart: regular rate and rhythm Extremities: no LEE Pulses: weak bilateral distal pulses Skin: warm and dry Neurologic: Grossly normal    ASSESSMENT AND PLAN:  PAD (peripheral artery disease) S/p diagnostic PV angio revealing a high-grade focal calcified proximal mid left SFA stenosis. The right SFA, popliteal and tibial vessels are occluded and there are no revascularization options for the right. Will plan for diamondback orbital rotational arthrectomy +/- PTA and or stenting. The femoral access site is stable, free from hematoma or bruit. No  groin, flank or back pain. Plan for f/u with Dr. Gwenlyn [name] regarding left SFA intervention.     PLAN  Plan for for diamondback orbital rotational arthrectomy +/- PTA and or stenting of left SFA with Dr. Gwenlyn [name] in ~ 4 weeks.   SIMMONS, [name] 07/18/2013 11:28 AM

## 2013-07-18 NOTE — Assessment & Plan Note (Signed)
S/p diagnostic PV angio revealing a high-grade focal calcified proximal mid left SFA stenosis. The right SFA, popliteal and tibial vessels are occluded and there are no revascularization options for the right. Will plan for diamondback orbital rotational arthrectomy +/- PTA and or stenting. The femoral access site is stable, free from hematoma or bruit. No groin, flank or back pain. Plan for f/u with Dr. Gwenlyn Found regarding left SFA intervention.

## 2013-09-26 ENCOUNTER — Ambulatory Visit (INDEPENDENT_AMBULATORY_CARE_PROVIDER_SITE_OTHER): Payer: Medicare Other | Admitting: Family Medicine

## 2013-09-26 ENCOUNTER — Encounter: Payer: Self-pay | Admitting: Family Medicine

## 2013-09-26 VITALS — BP 142/80 | HR 62 | Temp 97.8°F | Resp 16 | Ht 64.5 in | Wt 174.4 lb

## 2013-09-26 DIAGNOSIS — I10 Essential (primary) hypertension: Secondary | ICD-10-CM

## 2013-09-26 MED ORDER — LISINOPRIL-HYDROCHLOROTHIAZIDE 10-12.5 MG PO TABS: 1.0000 | ORAL_TABLET | Freq: Every day | ORAL | Status: DC

## 2013-09-28 NOTE — Progress Notes (Signed)
S:  This 80 y.o. AA male has HTN and PAD. He is compliant w/ medication w/o adverse effects. He has DOE and walking is limited to less than 1/2 block due to this symptom. He has been evaluated by Cardiovascular specialists; a vascular procedure has been recommended but pt opts to postpone it due to lack of adequate insurance coverage. He states that he will change his insurer in October and then reconsider having the procedure. He is able to go fishing but is not pleased about his restricted activity level.  Patient Active Problem List   Diagnosis Date Noted  . Essential hypertension 05/08/2013  . Claudication of calf muscles 05/06/2013  . PAD (peripheral artery disease) 05/05/2013  . Family history of early CAD 04/14/2013  . DOE (dyspnea on exertion) 04/14/2013  . Abnormal EKG 04/14/2013  . Calculus of bile duct without mention of cholecystitis or obstruction 11/27/2012  . Abdominal mass, right upper quadrant 11/18/2012  . Liver hemangioma 11/18/2012  . History of BPH 10/20/2012  . Valvular heart disease 10/17/2012   Prior to Admission medications   Medication Sig Start Date End Date Taking? Authorizing Provider  lisinopril-hydrochlorothiazide (PRINZIDE,ZESTORETIC) 10-12.5 MG per tablet Take 1 tablet by mouth daily.   Yes Barton Fanny, MD   PMHx, Surg Hx, Soc and Fam Hx reviewed.  Pt had abnormal PFTs in NOv 2014 and was advised to see Dr. Lake Bells (PULM) but he has not seen that specialist.  ROS: As per HPI, Negative for fatigue, diaphoresis, abnormal weight loss, anorexia, vision disturbances, CP or tightness, palpitations, cough, orthopnea. abd pain, HA, dizziness, numbness, weakness or syncope.  O: Filed Vitals:   09/26/13 0837  BP: 142/80  Pulse: 62  Temp: 97.8 F (36.6 C)  Resp: 16   GEN: In NAD; WN,WD. Weight up 2 lbs since Oct 2014. HENT: /AT; EOMI w/ clear conj and muddy sclerae. Otherwise unremarkable. COR: RRR.  LUNGS: Unlabored resp but distant BS throughout  lungs fields. SKIN: W&D; intact w/o diaphoresis. MS: MAEs; mild DJD changes. Walks unassisted. NEURO: A&O x 3; CNs intact. Pt sitting in exam room reading the newspaper.  A/P: Hypertension - Stable and controlled. No adverse medication effects. Pt to follow-up w/ Cardiology as scheduled. Plan: lisinopril-hydrochlorothiazide (PRINZIDE,ZESTORETIC) 10-12.5 MG per tablet

## 2013-12-08 ENCOUNTER — Telehealth: Payer: Self-pay | Admitting: Radiology

## 2013-12-08 NOTE — Telephone Encounter (Signed)
Message copied by Candice Camp on Mon Dec 08, 2013  4:43 PM ------      Message from: Ulice Dash S      Created: Mon Dec 08, 2013  4:17 PM      Regarding: HQPAF       The aforementioned patient has an OV scheduled with you for 03/04/14 @ 0845..the only thing I need to complete & close his form is urinary incontinence screening secondary to prostatic enlargement. Just in case you don't conveniently have a sample screening question survey, I've copied & pasted it below for your convenience.            URINARY INCONTINENCE      1. Many people experience problems with urinary incontinence, the leakage of urine. In the past 6      months, have you accidentally leaked urine?      o Yes      o No      2. How much of a problem, if any, was the urine leakage for you? o A big problem      o A small problem      o Not a problem      3. Have you talked with your current doctor or other health provider about your urine leakage      problem?      o Yes o No      4. There are many ways to treat urinary incontinence including bladder training, exercises,      medication and surgery. Have you received these or any other treatments for your current urine      leakage problem?      o Yes o No      MONITORING PHYSICAL ACTIVITY      5. In the past 12 months, did you talk with a doctor or other health provider about your level of      exercise or physical activity? For example, a doctor or other health provider may ask if you      exercise regularly or take part in physical exercise.      o Yes      o No      o I had no visits in the past 12 months      6. In the past 12 months, did a doctor or other health provider advise you to start, increase or      maintain your level of exercise or physical activity? For example, in order to improve your      health, your doctor or other health provider may advise you to start taking the stairs, increase      walking from 10 to 20 minutes every day or to maintain  your current exercise program.      o Yes o No      REDUCE RISK OF FALLING      7. A fall is when your body goes to the ground without being pushed. In the past 12 months, did      you talk with your doctor or other health provider about falling or problems with balance or      walking?      o Yes o No      o I had no visits in the past 12 months      8. Did you fall in the past 12 months? o Yes o No      9. In the past 12 months, have you  had a problem with balance or walking? o Yes o No      10. Has your doctor or other health provider done anything to help prevent falls or treat problems      with balance or walking? Some things they might do include:      E "h Suggest that you use a cane or walker.      E "h Check your blood pressure lying or standing.      E "h Suggest that you do an exercise or physical therapy program.      E "h Suggest a vision or hearing testing.      o Yes o No      o I had no visits in the past 12 months            Thank you,      Ulice Dash, RN ------

## 2013-12-08 NOTE — Telephone Encounter (Signed)
Noted, will make sure patient is screened for urinary incontinence, physical activity level, and a fall risk. Thanks.

## 2014-03-04 ENCOUNTER — Ambulatory Visit (INDEPENDENT_AMBULATORY_CARE_PROVIDER_SITE_OTHER): Payer: Medicare Other | Admitting: Family Medicine

## 2014-03-04 ENCOUNTER — Encounter: Payer: Self-pay | Admitting: Family Medicine

## 2014-03-04 VITALS — BP 142/90 | HR 77 | Temp 98.6°F | Resp 16 | Ht 65.0 in | Wt 178.8 lb

## 2014-03-04 DIAGNOSIS — Z0000 Encounter for general adult medical examination without abnormal findings: Secondary | ICD-10-CM

## 2014-03-04 DIAGNOSIS — I10 Essential (primary) hypertension: Secondary | ICD-10-CM

## 2014-03-04 DIAGNOSIS — J309 Allergic rhinitis, unspecified: Secondary | ICD-10-CM

## 2014-03-04 DIAGNOSIS — E559 Vitamin D deficiency, unspecified: Secondary | ICD-10-CM

## 2014-03-04 DIAGNOSIS — Z1211 Encounter for screening for malignant neoplasm of colon: Secondary | ICD-10-CM

## 2014-03-04 DIAGNOSIS — Z23 Encounter for immunization: Secondary | ICD-10-CM

## 2014-03-04 DIAGNOSIS — G479 Sleep disorder, unspecified: Secondary | ICD-10-CM

## 2014-03-04 DIAGNOSIS — I739 Peripheral vascular disease, unspecified: Secondary | ICD-10-CM

## 2014-03-04 LAB — LIPID PANEL
Cholesterol: 123 mg/dL (ref 0–200)
HDL: 36 mg/dL — AB (ref >39)
LDL CALC: 73 mg/dL (ref 0–99)
Total CHOL/HDL Ratio: 3.4 Ratio
Triglycerides: 71 mg/dL (ref <150)
VLDL: 14 mg/dL (ref 0–40)

## 2014-03-04 LAB — COMPLETE METABOLIC PANEL WITH GFR
ALT: 10 U/L (ref 0–53)
AST: 14 U/L (ref 0–37)
Albumin: 3.9 g/dL (ref 3.5–5.2)
Alkaline Phosphatase: 98 U/L (ref 39–117)
BUN: 19 mg/dL (ref 6–23)
CALCIUM: 9.8 mg/dL (ref 8.4–10.5)
CHLORIDE: 104 meq/L (ref 96–112)
CO2: 30 meq/L (ref 19–32)
CREATININE: 0.89 mg/dL (ref 0.50–1.35)
GFR, EST NON AFRICAN AMERICAN: 80 mL/min
GLUCOSE: 88 mg/dL (ref 70–99)
Potassium: 4.3 mEq/L (ref 3.5–5.3)
Sodium: 142 mEq/L (ref 135–145)
Total Bilirubin: 0.5 mg/dL (ref 0.2–1.2)
Total Protein: 6.6 g/dL (ref 6.0–8.3)

## 2014-03-04 MED ORDER — FLUTICASONE PROPIONATE 50 MCG/ACT NA SUSP: 1.0000 | Freq: Every day | NASAL | Status: DC

## 2014-03-04 NOTE — Progress Notes (Signed)
Subjective:    Patient ID: [name], male    DOB: 11-07-1929, 80 y.o.   MRN: [id number]  HPI  This 80 y.o. AA male is here for [facility] Subsequent Annual CPE.  Pt has ongoing evaluation w/ Cardiology ([facility]) for Valvular Heart Disease, PAD and abnormal EKG. Pt was advised to have peripheral angiogram/PTA per Dr. Gwenlyn Found; pt has postponed this imaging for now due to financial reasons. He states he will be reviewing his current [facility] plan and looking for one with more benefits and/or lower premium. Until then, he is declining any referrals or testing.  HCM: IMM- Needs Pneumovax and Zostavax.           CRS- Does not recall having screening.           Dental- "Not in a long time".             Patient Active Problem List   Diagnosis Date Noted  . Essential hypertension 05/08/2013  . Claudication of calf muscles 05/06/2013  . PAD (peripheral artery disease) 05/05/2013  . Family history of early CAD 04/14/2013  . DOE (dyspnea on exertion) 04/14/2013  . Abnormal EKG 04/14/2013  . Calculus of bile duct without mention of cholecystitis or obstruction 11/27/2012  . Abdominal mass, right upper quadrant 11/18/2012  . Liver hemangioma 11/18/2012  . History of BPH 10/20/2012  . Valvular heart disease 10/17/2012    Prior to Admission medications   Medication Sig Start Date End Date Taking? Authorizing Provider  lisinopril-hydrochlorothiazide (PRINZIDE,ZESTORETIC) 10-12.5 MG per tablet Take 1 tablet by mouth daily. 09/26/13  Yes Barton Fanny, MD    History   Social History  . Marital Status: Single    Spouse Name: N/A    Number of Children: N/A  . Years of Education: N/A   Occupational History  . Retired    Social History Main Topics  . Smoking status: Former Smoker    Types: Cigarettes    Quit date: 11/25/1980  . Smokeless tobacco: Never Used  . Alcohol Use: No  . Drug Use: No  . Sexual Activity: Not on file   Other Topics Concern  . Not on file   Social  History Narrative   He is originally from [place]. He has a history of long-standing tobacco use but quit several years ago. He also has a history of former alcohol abuse as well.   He is retired and currently single.    Family History  Problem Relation Age of Onset  . Diabetes Sister   . Cancer Sister   . Cancer Mother   . Cancer Father   . Cancer Sister      Review of Systems  Constitutional: Positive for activity change.       Walking limited due to lower ext PAD and claudication.  HENT: Positive for dental problem and rhinorrhea.   Eyes: Positive for visual disturbance.       Wears corrective lenses.  Respiratory: Negative.   Cardiovascular: Negative.   Gastrointestinal: Negative.   Endocrine: Negative.   Musculoskeletal: Positive for gait problem.  Neurological: Negative.   Psychiatric/Behavioral: Positive for sleep disturbance and agitation. Negative for dysphoric mood. The patient is not nervous/anxious.   All other systems reviewed and are negative.      Objective:   Physical Exam  Nursing note and vitals reviewed. Constitutional: He is oriented to person, place, and time. Vital signs are normal. He appears well-developed and well-nourished. No distress.  HENT:  Head: Normocephalic and atraumatic.  Right Ear: Hearing, tympanic membrane, external ear and ear canal normal.  Left Ear: Hearing, tympanic membrane, external ear and ear canal normal.  Nose: Nose normal. No nasal deformity or septal deviation.  Mouth/Throat: Uvula is midline, oropharynx is clear and moist and mucous membranes are normal. No oral lesions. Abnormal dentition.  Poor dentition- Missing teeth.  Eyes: Conjunctivae, EOM and lids are normal. Pupils are equal, round, and reactive to light. No scleral icterus.  Wears corrective lenses; muddy sclerae.  Neck: Trachea normal, normal range of motion, full passive range of motion without pain and phonation normal. Neck supple. No JVD present. No  spinous process tenderness and no muscular tenderness present. No mass and no thyromegaly present.  Cardiovascular: Normal rate, regular rhythm, S1 normal and S2 normal.  PMI is not displaced.  Exam reveals no friction rub.   Murmur heard.  Crescendo systolic murmur is present with a grade of 2/6  Pulses:      Carotid pulses are 1+ on the right side, and 1+ on the left side.      Radial pulses are 2+ on the right side, and 2+ on the left side.       Femoral pulses are 2+ on the right side, and 2+ on the left side.      Dorsalis pedis pulses are 1+ on the right side, and 1+ on the left side.       Posterior tibial pulses are 1+ on the right side, and 1+ on the left side.  Soft blowing murmur at LUSB and RUSB.  Pulmonary/Chest: Effort normal. No respiratory distress. He has decreased breath sounds in the right lower field and the left lower field. He has no wheezes. He has no rales.  Abdominal: Soft. Normal appearance and bowel sounds are normal. He exhibits no distension, no pulsatile midline mass and no mass. There is no hepatosplenomegaly. There is no tenderness. There is no guarding and no CVA tenderness.  Musculoskeletal:       Cervical back: Normal.       Thoracic back: Normal.       Lumbar back: Normal.  Degenerative changes in major joints, hands and feet.  Lymphadenopathy:       Head (right side): No submental, no submandibular, no tonsillar, no posterior auricular and no occipital adenopathy present.       Head (left side): No submental, no submandibular, no tonsillar, no posterior auricular and no occipital adenopathy present.    He has no cervical adenopathy.    He has no axillary adenopathy.       Right: No supraclavicular adenopathy present.  Neurological: He is alert and oriented to person, place, and time. He has normal strength. He displays no atrophy and no tremor. No cranial nerve deficit or sensory deficit. He exhibits normal muscle tone. He displays a negative Romberg sign.  Coordination and gait normal.  Reflex Scores:      Tricep reflexes are 1+ on the right side and 1+ on the left side.      Bicep reflexes are 2+ on the right side and 2+ on the left side.      Brachioradialis reflexes are 2+ on the right side and 2+ on the left side.      Patellar reflexes are 1+ on the right side and 1+ on the left side. Get Up and GO test: time = 10 sec  Skin: Skin is warm, dry and intact. No ecchymosis, no lesion and  no rash noted. He is not diaphoretic. No erythema.  Trunk- multiple flat pigmented nevi. Feet with long, thick discolored nails. Lower extremity skin is hyperpigmented and digits are cool to touch; capillary refill is fair.  Psychiatric: He has a normal mood and affect. His speech is normal and behavior is normal. Judgment and thought content normal. Cognition and memory are normal.       Assessment & Plan:  Routine general medical examination at a health care facility - Reviewed CRS recommendation; pt will consider this early next year, depending on [facility] plan (supplement?) that he purchases. Plan: Lipid panel, COMPLETE METABOLIC PANEL WITH GFR  Essential hypertension - Stable and controlled on current medication.  Plan: Lipid panel, COMPLETE METABOLIC PANEL WITH GFR  Sleep disturbance, unspecified- Pt watches TV until 2 AM; this is a chronic habit. He is not very active and considers himself "a loner".  Unspecified vitamin D deficiency - Plan: Vitamin D, 25-hydroxy  PAD (peripheral artery disease) - May consider follow-up w/ vascular specialist once he has a new MCR plan.   Plan: Lipid panel  Allergic rhinitis, cause unspecified- Trial Fluticasone NS at bedtime. Pt refuses to by OTC allergy products.  Need for prophylactic vaccination against Streptococcus pneumoniae (pneumococcus) - Plan: Pneumococcal polysaccharide vaccine 23-valent greater than or equal to 2yo subcutaneous/IM   Meds ordered this encounter  Medications  . fluticasone (FLONASE) 50  MCG/ACT nasal spray    Sig: Place 1 spray into both nostrils daily.    Dispense:  16 g    Refill:  6

## 2014-03-04 NOTE — Patient Instructions (Signed)
Keeping you healthy  Get these tests  Blood pressure- Have your blood pressure checked once a year by your healthcare provider.  Normal blood pressure is 120/80  Weight- Have your body mass index (BMI) calculated to screen for obesity.  BMI is a measure of body fat based on height and weight. You can also calculate your own BMI at [employer].si.  Cholesterol- Have your cholesterol checked every year.  Diabetes- Have your blood sugar checked regularly if you have high blood pressure, high cholesterol, have a family history of diabetes or if you are overweight.  Screening for Colon Cancer- Colonoscopy starting at age 87.  Screening may begin sooner depending on your family history and other health conditions. Follow up colonoscopy as directed by your Gastroenterologist.  Screening for Prostate Cancer- Both blood work (PSA) and a rectal exam help screen for Prostate Cancer.  Screening begins at age 65 with African-American men and at age 77 with Caucasian men.  Screening may begin sooner depending on your family history.  Take these medicines  Aspirin- One aspirin daily can help prevent Heart disease and Stroke.  Flu shot- Every fall. You have already had this vaccine.  Tetanus- Every 10 years.  Zostavax- Once after the age of 12 to prevent Shingles.  Pneumonia shot- Once after the age of 86; if you are younger than 94, ask your healthcare provider if you need a Pneumonia shot.  Take these steps  Don't smoke- If you do smoke, talk to your doctor about quitting.  For tips on how to quit, go to www.smokefree.gov or call 1-800-QUIT-NOW.  Be physically active- Exercise 5 days a week for at least 30 minutes.  If you are not already physically active start slow and gradually work up to 30 minutes of moderate physical activity.  Examples of moderate activity include walking briskly, mowing the yard, dancing, swimming, bicycling, etc.  Eat a healthy diet- Eat a variety of healthy  food such as fruits, vegetables, low fat milk, low fat cheese, yogurt, lean meant, poultry, fish, beans, tofu, etc. For more information go to www.thenutritionsource.org  Drink alcohol in moderation- Limit alcohol intake to less than two drinks a day. Never drink and drive.  Dentist- Brush and floss twice daily; visit your dentist twice a year.  Depression- Your emotional health is as important as your physical health. If you're feeling down, or losing interest in things you would normally enjoy please talk to your healthcare provider.  Eye exam- Visit your eye doctor every year.  Safe sex- If you may be exposed to a sexually transmitted infection, use a condom.  Seat belts- Seat belts can save your life; always wear one.  Smoke/Carbon Monoxide detectors- These detectors need to be installed on the appropriate level of your home.  Replace batteries at least once a year.  Skin cancer- When out in the sun, cover up and use sunscreen 15 SPF or higher.  Violence- If anyone is threatening you, please tell your healthcare provider.  Living Will/ Health care power of attorney- Speak with your healthcare provider and family.

## 2014-03-05 ENCOUNTER — Other Ambulatory Visit: Payer: Self-pay | Admitting: Family Medicine

## 2014-03-05 LAB — VITAMIN D 25 HYDROXY (VIT D DEFICIENCY, FRACTURES): VIT D 25 HYDROXY: 27 ng/mL — AB (ref 30–89)

## 2014-03-05 MED ORDER — ADULT MULTIVITAMIN W/MINERALS CH: 1.0000 | ORAL_TABLET | Freq: Every day | ORAL | Status: DC

## 2014-03-05 NOTE — Progress Notes (Signed)
Quick Note:  Please advise pt regarding following labs... Vitamin D level is a little low; i am prescribing a multi-vitamin for you to take daily with a meal.  All other labs look good. HDL ( "good" cholesterol) is a little low. Increase your physical activity and try to include more healthy oils in your diet (olive oil, walnuts, sardines, etc).   Copy to pt. ______

## 2014-03-16 LAB — IFOBT (OCCULT BLOOD)
IFOBT: NEGATIVE
IFOBT: NEGATIVE
IFOBT: NEGATIVE

## 2014-03-16 NOTE — Addendum Note (Signed)
Addended by: Yvette Rack on: 03/16/2014 04:13 PM   Modules accepted: Orders

## 2014-03-16 NOTE — Addendum Note (Signed)
Addended by: Yvette Rack on: 03/16/2014 04:11 PM   Modules accepted: Orders

## 2014-03-17 NOTE — Progress Notes (Signed)
Quick Note:  Please advise pt regarding following labs...  Stool test returned has no trace of blood in it.  ______

## 2014-04-20 ENCOUNTER — Telehealth: Payer: Self-pay

## 2014-05-14 ENCOUNTER — Encounter (HOSPITAL_COMMUNITY): Payer: Self-pay | Admitting: Cardiovascular Disease

## 2014-05-27 ENCOUNTER — Ambulatory Visit (INDEPENDENT_AMBULATORY_CARE_PROVIDER_SITE_OTHER): Payer: Medicare Other | Admitting: Family Medicine

## 2014-05-27 VITALS — BP 132/62 | HR 64 | Temp 98.1°F | Resp 18 | Ht 65.0 in | Wt 180.6 lb

## 2014-05-27 DIAGNOSIS — M79644 Pain in right finger(s): Secondary | ICD-10-CM

## 2014-05-27 DIAGNOSIS — L03011 Cellulitis of right finger: Secondary | ICD-10-CM

## 2014-05-27 MED ORDER — DOXYCYCLINE HYCLATE 100 MG PO TABS: 100.0000 mg | ORAL_TABLET | Freq: Two times a day (BID) | ORAL | Status: DC

## 2014-05-27 NOTE — Patient Instructions (Addendum)
Start antibiotic as discussed. We will call you if the wound culture indicates a need to change the antibiotic. Warm compresses and clean over area with soap and water few times per day. Return to the clinic or go to the nearest emergency room if any of your symptoms worsen or new symptoms occur.  Take antibiotic until finished Follow up with Dr. Flonnie Hailstone Paronychia is an inflammatory reaction involving the folds of the skin surrounding the fingernail. This is commonly caused by an infection in the skin around a nail. The most common cause of paronychia is frequent wetting of the hands (as seen with bartenders, food servers, nurses or others who wet their hands). This makes the skin around the fingernail susceptible to infection by bacteria (germs) or fungus. Other predisposing factors are:  Aggressive manicuring.  Nail biting.  Thumb sucking. The most common cause is a staphylococcal (a type of germ) infection, or a fungal (Candida) infection. When caused by a germ, it usually comes on suddenly with redness, swelling, pus and is often painful. It may get under the nail and form an abscess (collection of pus), or form an abscess around the nail. If the nail itself is infected with a fungus, the treatment is usually prolonged and may require oral medicine for up to one year. Your caregiver will determine the length of time treatment is required. The paronychia caused by bacteria (germs) may largely be avoided by not pulling on hangnails or picking at cuticles. When the infection occurs at the tips of the finger it is called felon. When the cause of paronychia is from the herpes simplex virus (HSV) it is called herpetic whitlow. TREATMENT  When an abscess is present treatment is often incision and drainage. This means that the abscess must be cut open so the pus can get out. When this is done, the following home care instructions should be followed. HOME CARE INSTRUCTIONS   It is  important to keep the affected fingers very dry. Rubber or plastic gloves over cotton gloves should be used whenever the hand must be placed in water.  Keep wound clean, dry and dressed as suggested by your caregiver between warm soaks or warm compresses.  Soak in warm water for fifteen to twenty minutes three to four times per day for bacterial infections. Fungal infections are very difficult to treat, so often require treatment for long periods of time.  For bacterial (germ) infections take antibiotics (medicine which kill germs) as directed and finish the prescription, even if the problem appears to be solved before the medicine is gone.  Only take over-the-counter or prescription medicines for pain, discomfort, or fever as directed by your caregiver. SEEK IMMEDIATE MEDICAL CARE IF:  You have redness, swelling, or increasing pain in the wound.  You notice pus coming from the wound.  You have a fever.  You notice a bad smell coming from the wound or dressing. Document Released: 11/15/2000 Document Revised: 08/14/2011 Document Reviewed: 07/17/2008 [facility] Patient Information 2015 [place], [place]. This information is not intended to replace advice given to you by your health care provider. Make sure you discuss any questions you have with your health care provider.

## 2014-05-27 NOTE — Progress Notes (Signed)
Subjective:  This chart was scribed for [name], MD by Erling Conte, Medical Scribe. This patient was seen in Room 11 and the patient's care was started at 2:12 PM.   Patient ID: [name], male    DOB: [birth year], 80 y.o.   MRN: [id number]  Chief Complaint  Patient presents with  . Hand Pain    Rt Thumb pain and swelling x few days - NKI -    HPI HPI Comments: [name] is a 80 y.o. male who presents to the [facility] complaining of constant, gradually worsening, throbbing, right thumb pain for 2-3 days. He is complaining of associated right thumb swelling. Pt states that the thumb is exacerbated by trying to write or use the thumb. He denies any injury to the thumb. He states he is still able to move his right thumb. Pt has not done anything or taken anything for his symptoms. He denies any drainage or redness to the thumb.   Patient Active Problem List   Diagnosis Date Noted  . Essential hypertension 05/08/2013  . Claudication of calf muscles 05/06/2013  . PAD (peripheral artery disease) 05/05/2013  . Family history of early CAD 04/14/2013  . DOE (dyspnea on exertion) 04/14/2013  . Abnormal EKG 04/14/2013  . Calculus of bile duct without mention of cholecystitis or obstruction 11/27/2012  . Abdominal mass, right upper quadrant 11/18/2012  . Liver hemangioma 11/18/2012  . History of BPH 10/20/2012  . Valvular heart disease 10/17/2012   Past Medical History  Diagnosis Date  . BPH (benign prostatic hyperplasia)   . Exertional dyspnea     with exertion  . Hypertension   . Mitral valvular regurgitation June 2014    Mild to moderate MR on echocardiogram  . PAD (peripheral artery disease)  December 2014    Lower extremity Dopplers/ABIs: RABI 0.29, LABI 0.66; bilateral external iliacs with significant diameter reduction; R. SFA occlusive disease throughout into popliteal artery with 0 vessel runoff. Anterior tibial reconstitutes distally.  70-99% reduction in all SFA. One-vessel runoff (anterior tibial)   Past Surgical History  Procedure Laterality Date  . Ercp N/A 11/27/2012    Procedure: ENDOSCOPIC RETROGRADE CHOLANGIOPANCREATOGRAPHY (ERCP);  Surgeon: Inda Castle, MD;  Location: [facility];  Service: Endoscopy;  Laterality: N/A;  . Eye surgery Right   . Cholecystectomy  07/04/2010  . Prostate surgery      Dr. Lowella Bandy  . Nm myoview ltd  December 2014    Apical thinning but no ischemia. EF 55%  . Transthoracic echocardiogram  June 2014    Basal septal thickening with moderate LVH. EF 65%. Normal wall motion. Diastolic dysfunction/NOS. Aortic sclerosis without stenosis. Mild to moderate MR  . Lower extremity angiogram N/A 07/07/2013    Procedure: LOWER EXTREMITY ANGIOGRAM;  Surgeon: Lorretta Harp, MD;  Location: [facility] CATH LAB;  Service: Cardiovascular;  Laterality: N/A;  . Left heart catheterization with coronary angiogram N/A 07/07/2013    Procedure: LEFT HEART CATHETERIZATION WITH CORONARY ANGIOGRAM;  Surgeon: Lorretta Harp, MD;  Location: [facility] CATH LAB;  Service: Cardiovascular;  Laterality: N/A;  . Abdominal angiogram  07/07/2013    Procedure: ABDOMINAL ANGIOGRAM;  Surgeon: Lorretta Harp, MD;  Location: [facility] CATH LAB;  Service: Cardiovascular;;   No Known Allergies Prior to Admission medications   Medication Sig Start Date End Date Taking? Authorizing Provider  lisinopril-hydrochlorothiazide (PRINZIDE,ZESTORETIC) 10-12.5 MG per tablet Take 1 tablet by mouth daily. 09/26/13  Yes Barton Fanny,  MD   History   Social History  . Marital Status: Single    Spouse Name: N/A    Number of Children: N/A  . Years of Education: N/A   Occupational History  . Retired    Social History Main Topics  . Smoking status: Former Smoker    Types: Cigarettes    Quit date: 11/25/1980  . Smokeless tobacco: Never Used  . Alcohol Use: No  . Drug Use: No  . Sexual Activity: Not on file   Other Topics Concern  . Not on  file   Social History Narrative   He is originally from [place]. He has a history of long-standing tobacco use but quit several years ago. He also has a history of former alcohol abuse as well.   He is retired and currently single.    Review of Systems  Musculoskeletal: Positive for joint swelling (Right thumb) and arthralgias (R thumb).  Skin: Negative for color change and wound.  All other systems reviewed and are negative.      Objective:   Physical Exam  Constitutional: He is oriented to person, place, and time. He appears well-developed and well-nourished. No distress.  HENT:  Head: Normocephalic and atraumatic.  Eyes: Conjunctivae and EOM are normal.  Neck: Neck supple. No tracheal deviation present.  Cardiovascular: Normal rate.   Pulmonary/Chest: Effort normal. No respiratory distress.  Musculoskeletal: Normal range of motion.  Right thumb: lateral nail fold is swollen, erythematous with fluctuance centrally. Nail is intact. The pad of the finger is unaffected. Full ROM at IP joint  Neurological: He is alert and oriented to person, place, and time.  Skin: Skin is warm and dry.  Psychiatric: He has a normal mood and affect. His behavior is normal.  Nursing note and vitals reviewed.     Filed Vitals:   05/27/14 1309  BP: 132/62  Pulse: 64  Temp: 98.1 F (36.7 C)  TempSrc: Oral  Resp: 18  Height: 5\' 5"  (1.651 m)  Weight: 180 lb 9.6 oz (81.92 kg)  SpO2: 99%       Assessment & Plan:   [name] is a 80 y.o. male Pain of right thumb, Paronychia of right thumb - Plan: Wound culture, doxycycline (VIBRA-TABS) 100 MG tablet  -I and D per procedure note, started on doxycycline, wound culture pending. Wound care discussed.  rtc precautions given.    Meds ordered this encounter  Medications  . doxycycline (VIBRA-TABS) 100 MG tablet    Sig: Take 1 tablet (100 mg total) by mouth 2 (two) times daily.    Dispense:  20 tablet    Refill:  0   Patient  Instructions  Start antibiotic as discussed. We will call you if the wound culture indicates a need to change the antibiotic. Warm compresses and clean over area with soap and water few times per day. Return to the clinic or go to the nearest emergency room if any of your symptoms worsen or new symptoms occur.  Take antibiotic until finished Follow up with Dr. Flonnie Hailstone Paronychia is an inflammatory reaction involving the folds of the skin surrounding the fingernail. This is commonly caused by an infection in the skin around a nail. The most common cause of paronychia is frequent wetting of the hands (as seen with bartenders, food servers, nurses or others who wet their hands). This makes the skin around the fingernail susceptible to infection by bacteria (germs) or fungus. Other predisposing factors are:  Aggressive manicuring.  Nail biting.  Thumb sucking. The most common cause is a staphylococcal (a type of germ) infection, or a fungal (Candida) infection. When caused by a germ, it usually comes on suddenly with redness, swelling, pus and is often painful. It may get under the nail and form an abscess (collection of pus), or form an abscess around the nail. If the nail itself is infected with a fungus, the treatment is usually prolonged and may require oral medicine for up to one year. Your caregiver will determine the length of time treatment is required. The paronychia caused by bacteria (germs) may largely be avoided by not pulling on hangnails or picking at cuticles. When the infection occurs at the tips of the finger it is called felon. When the cause of paronychia is from the herpes simplex virus (HSV) it is called herpetic whitlow. TREATMENT  When an abscess is present treatment is often incision and drainage. This means that the abscess must be cut open so the pus can get out. When this is done, the following home care instructions should be followed. HOME CARE INSTRUCTIONS     It is important to keep the affected fingers very dry. Rubber or plastic gloves over cotton gloves should be used whenever the hand must be placed in water.  Keep wound clean, dry and dressed as suggested by your caregiver between warm soaks or warm compresses.  Soak in warm water for fifteen to twenty minutes three to four times per day for bacterial infections. Fungal infections are very difficult to treat, so often require treatment for long periods of time.  For bacterial (germ) infections take antibiotics (medicine which kill germs) as directed and finish the prescription, even if the problem appears to be solved before the medicine is gone.  Only take over-the-counter or prescription medicines for pain, discomfort, or fever as directed by your caregiver. SEEK IMMEDIATE MEDICAL CARE IF:  You have redness, swelling, or increasing pain in the wound.  You notice pus coming from the wound.  You have a fever.  You notice a bad smell coming from the wound or dressing. Document Released: 11/15/2000 Document Revised: 08/14/2011 Document Reviewed: 07/17/2008 [facility] Patient Information 2015 [employer], [place]. This information is not intended to replace advice given to you by your health care provider. Make sure you discuss any questions you have with your health care provider.      I personally performed the services described in this documentation, which was scribed in my presence. The recorded information has been reviewed and considered, and addended by me as needed.

## 2014-05-27 NOTE — Progress Notes (Signed)
Procedure: Verbal consent obtained. Digital block on right thumb performed with 4 cc 1% lido. Finger cleaned with 2 iodine swabs. A 0.25 incision made in the lateral nail bed with a #11 blade. A small amount of purulent material expressed. Wound dressed and wound care discussed.

## 2014-05-30 LAB — WOUND CULTURE
GRAM STAIN: NONE SEEN
Gram Stain: NONE SEEN
Gram Stain: NONE SEEN

## 2014-06-03 ENCOUNTER — Ambulatory Visit (INDEPENDENT_AMBULATORY_CARE_PROVIDER_SITE_OTHER): Payer: Medicare Other | Admitting: Family Medicine

## 2014-06-03 ENCOUNTER — Encounter: Payer: Self-pay | Admitting: Family Medicine

## 2014-06-03 VITALS — BP 146/72 | HR 74 | Temp 98.0°F | Resp 16 | Ht 65.5 in | Wt 183.0 lb

## 2014-06-03 DIAGNOSIS — L03011 Cellulitis of right finger: Secondary | ICD-10-CM

## 2014-06-03 NOTE — Progress Notes (Signed)
S:  This 80 y.o. AA male returns for re-check after I&D of paronychia on 05/27/2014. He had painful swelling near R thumb nail. Today, he states lesion is pain-free and no drainage evident. Is taking antibiotic as prescribed. Doxycycline capsules cost $40+ and pt was told by pharmacist that the tablets would have cost $17.00.  Pt using gloves to wash dishes so that digits remain dry.  PROBLEM LIST, PMHx, SURG Hx and MEDICATIONS reviewed.  ROS: As per HPI.  O: Filed Vitals:   06/03/14 1107  BP: 146/72  Pulse: 74  Temp: 98 F (36.7 C)  Resp: 16    GEN: In NAD; WN,WD. HENT: Unremarkable. LUNGS: Unlabored resp. SKIN: R hand- thumb has darkened NT nodule at nail edge; it is not actively draining. NEURO: A7O x 3; Nonfocal. Wound culture: Escherichia coli; no white cells.  A/P: Paronychia, right- Complete antibiotic.

## 2014-08-13 DIAGNOSIS — N5201 Erectile dysfunction due to arterial insufficiency: Secondary | ICD-10-CM | POA: Diagnosis not present

## 2014-08-13 DIAGNOSIS — R972 Elevated prostate specific antigen [PSA]: Secondary | ICD-10-CM | POA: Diagnosis not present

## 2014-08-13 DIAGNOSIS — N390 Urinary tract infection, site not specified: Secondary | ICD-10-CM | POA: Diagnosis not present

## 2014-08-13 DIAGNOSIS — R3912 Poor urinary stream: Secondary | ICD-10-CM | POA: Diagnosis not present

## 2014-08-13 DIAGNOSIS — N400 Enlarged prostate without lower urinary tract symptoms: Secondary | ICD-10-CM | POA: Diagnosis not present

## 2014-09-02 ENCOUNTER — Ambulatory Visit (INDEPENDENT_AMBULATORY_CARE_PROVIDER_SITE_OTHER): Payer: Medicare Other | Admitting: Family Medicine

## 2014-09-02 ENCOUNTER — Encounter: Payer: Self-pay | Admitting: Family Medicine

## 2014-09-02 VITALS — BP 170/95 | HR 79 | Temp 97.8°F | Resp 16 | Ht 65.5 in | Wt 182.4 lb

## 2014-09-02 DIAGNOSIS — I10 Essential (primary) hypertension: Secondary | ICD-10-CM

## 2014-09-02 LAB — BASIC METABOLIC PANEL
BUN: 15 mg/dL (ref 6–23)
CALCIUM: 9.3 mg/dL (ref 8.4–10.5)
CO2: 29 mEq/L (ref 19–32)
CREATININE: 0.75 mg/dL (ref 0.50–1.35)
Chloride: 102 mEq/L (ref 96–112)
Glucose, Bld: 81 mg/dL (ref 70–99)
POTASSIUM: 4.2 meq/L (ref 3.5–5.3)
Sodium: 140 mEq/L (ref 135–145)

## 2014-09-02 MED ORDER — LISINOPRIL-HYDROCHLOROTHIAZIDE 10-12.5 MG PO TABS: 1.0000 | ORAL_TABLET | Freq: Every day | ORAL | Status: DC

## 2014-09-02 NOTE — Patient Instructions (Signed)

## 2014-09-03 ENCOUNTER — Encounter: Payer: Self-pay | Admitting: Family Medicine

## 2014-09-03 NOTE — Progress Notes (Signed)
Quick Note:  Please notify pt that results are normal.   Provide pt with copy of labs. ______ 

## 2014-09-03 NOTE — Progress Notes (Signed)
S: This 81 y.o. Male has well controlled. He is compliant w/ lisinopril- HCTZ 10-12.5 mg daily w/o adverse effects. He feels well; he has scheduled a dental appointment now that he has dental insurance. He has significant dental issues (caries and missing teeth); earliest appt w/ dentist who accepts his insurance is Sept 2016.  Pt denies diaphoresis, fatigue, dental pain, difficulty swallowing, swollen glands,  CP or tightness, palpitations, SOB or DOE, cough, edema, HA, dizziness, numbness, weakness or syncope.  Patient Active Problem List   Diagnosis Date Noted  . Essential hypertension 05/08/2013  . Claudication of calf muscles 05/06/2013  . PAD (peripheral artery disease) 05/05/2013  . Family history of early CAD 04/14/2013  . DOE (dyspnea on exertion) 04/14/2013  . Abnormal EKG 04/14/2013  . Calculus of bile duct without mention of cholecystitis or obstruction 11/27/2012  . Abdominal mass, right upper quadrant 11/18/2012  . Liver hemangioma 11/18/2012  . History of BPH 10/20/2012  . Valvular heart disease 10/17/2012    Prior to Admission medications   Medication Sig Start Date End Date Taking? Authorizing Provider  lisinopril-hydrochlorothiazide (PRINZIDE,ZESTORETIC) 10-12.5 MG per tablet Take 1 tablet by mouth daily.   Yes Barton Fanny, MD   Past Surgical History  Procedure Laterality Date  . Ercp N/A 11/27/2012    Procedure: ENDOSCOPIC RETROGRADE CHOLANGIOPANCREATOGRAPHY (ERCP);  Surgeon: Inda Castle, MD;  Location: [facility];  Service: Endoscopy;  Laterality: N/A;  . Eye surgery Right   . Cholecystectomy  07/04/2010  . Prostate surgery      Dr. Lowella Bandy  . Nm myoview ltd  December 2014    Apical thinning but no ischemia. EF 55%  . Transthoracic echocardiogram  June 2014    Basal septal thickening with moderate LVH. EF 65%. Normal wall motion. Diastolic dysfunction/NOS. Aortic sclerosis without stenosis. Mild to moderate MR  . Lower extremity angiogram N/A  07/07/2013    Procedure: LOWER EXTREMITY ANGIOGRAM;  Surgeon: Lorretta Harp, MD;  Location: [facility] CATH LAB;  Service: Cardiovascular;  Laterality: N/A;  . Left heart catheterization with coronary angiogram N/A 07/07/2013    Procedure: LEFT HEART CATHETERIZATION WITH CORONARY ANGIOGRAM;  Surgeon: Lorretta Harp, MD;  Location: [facility] CATH LAB;  Service: Cardiovascular;  Laterality: N/A;  . Abdominal angiogram  07/07/2013    Procedure: ABDOMINAL ANGIOGRAM;  Surgeon: Lorretta Harp, MD;  Location: [facility] CATH LAB;  Service: Cardiovascular;;    SOC and FAM HX reviewed.  ROS: As per HPI.  O: Filed Vitals:   09/02/14 1119  BP: 170/95  Pulse: 79  Temp: 97.8 F (36.6 C)  Resp: 16    GEN: In NAD: WN,WD. HENT: [place]/AT; EOMI w/ muddy sclerae. Dentition- poor. COR: RRR. LUNGS: Unlabored resp. SKIN; W&D; intact w/o erythema or diaphoresis. MS: MAEs; no c/c/e. NEURO: A&O x 3; CNs intact. Nonfocal.  A/P: Essential hypertension - Stable on medication. Encouraged good nutrition and staying physically active. Plan: lisinopril-hydrochlorothiazide (PRINZIDE,ZESTORETIC) 10-12.5 MG per tablet, Basic metabolic panel

## 2014-12-02 ENCOUNTER — Other Ambulatory Visit: Payer: Self-pay | Admitting: Family Medicine

## 2015-03-15 ENCOUNTER — Encounter: Payer: Medicare Other | Admitting: Family Medicine

## 2015-05-10 ENCOUNTER — Encounter: Payer: Self-pay | Admitting: Family Medicine

## 2015-05-10 ENCOUNTER — Ambulatory Visit (INDEPENDENT_AMBULATORY_CARE_PROVIDER_SITE_OTHER): Payer: Medicare Other | Admitting: Family Medicine

## 2015-05-10 VITALS — BP 130/80 | HR 73 | Temp 98.0°F | Resp 16 | Ht 64.75 in | Wt 172.8 lb

## 2015-05-10 DIAGNOSIS — Z23 Encounter for immunization: Secondary | ICD-10-CM

## 2015-05-10 DIAGNOSIS — K029 Dental caries, unspecified: Secondary | ICD-10-CM | POA: Diagnosis not present

## 2015-05-10 DIAGNOSIS — I10 Essential (primary) hypertension: Secondary | ICD-10-CM | POA: Diagnosis not present

## 2015-05-10 DIAGNOSIS — I739 Peripheral vascular disease, unspecified: Secondary | ICD-10-CM

## 2015-05-10 DIAGNOSIS — R7989 Other specified abnormal findings of blood chemistry: Secondary | ICD-10-CM | POA: Diagnosis not present

## 2015-05-10 DIAGNOSIS — Z125 Encounter for screening for malignant neoplasm of prostate: Secondary | ICD-10-CM

## 2015-05-10 DIAGNOSIS — Z0000 Encounter for general adult medical examination without abnormal findings: Secondary | ICD-10-CM | POA: Diagnosis not present

## 2015-05-10 DIAGNOSIS — K4090 Unilateral inguinal hernia, without obstruction or gangrene, not specified as recurrent: Secondary | ICD-10-CM | POA: Diagnosis not present

## 2015-05-10 DIAGNOSIS — Z1211 Encounter for screening for malignant neoplasm of colon: Secondary | ICD-10-CM | POA: Diagnosis not present

## 2015-05-10 DIAGNOSIS — N400 Enlarged prostate without lower urinary tract symptoms: Secondary | ICD-10-CM | POA: Diagnosis not present

## 2015-05-10 DIAGNOSIS — E559 Vitamin D deficiency, unspecified: Secondary | ICD-10-CM | POA: Diagnosis not present

## 2015-05-10 LAB — COMPLETE METABOLIC PANEL WITH GFR
ALT: 11 U/L (ref 9–46)
AST: 14 U/L (ref 10–35)
Albumin: 4 g/dL (ref 3.6–5.1)
Alkaline Phosphatase: 98 U/L (ref 40–115)
BUN: 17 mg/dL (ref 7–25)
CALCIUM: 9.2 mg/dL (ref 8.6–10.3)
CO2: 34 mmol/L — ABNORMAL HIGH (ref 20–31)
CREATININE: 0.89 mg/dL (ref 0.70–1.11)
Chloride: 102 mmol/L (ref 98–110)
GFR, Est Non African American: 79 mL/min (ref >=60)
Glucose, Bld: 66 mg/dL (ref 65–99)
Potassium: 5 mmol/L (ref 3.5–5.3)
Sodium: 141 mmol/L (ref 135–146)
Total Bilirubin: 0.6 mg/dL (ref 0.2–1.2)
Total Protein: 6.5 g/dL (ref 6.1–8.1)

## 2015-05-10 LAB — LIPID PANEL
CHOLESTEROL: 93 mg/dL — AB (ref 125–200)
HDL: 29 mg/dL — ABNORMAL LOW (ref >=40)
LDL Cholesterol: 46 mg/dL (ref <130)
Total CHOL/HDL Ratio: 3.2 Ratio (ref <=5.0)
Triglycerides: 89 mg/dL (ref <150)
VLDL: 18 mg/dL (ref <30)

## 2015-05-10 LAB — IFOBT (OCCULT BLOOD): IFOBT: NEGATIVE

## 2015-05-10 NOTE — Progress Notes (Signed)
Subjective:    Patient ID: [name], male    DOB: 17-Jul-1929, 81 y.o.   MRN: [id number] By signing my name below, I, Zola Button, attest that this documentation has been prepared under the direction and in the presence of [name], MD.  Electronically Signed: Zola Button, Medical Scribe. 05/10/2015. 2:48 PM.  HPI HPI Comments: [name] is a 81 y.o. male who presents to the [facility] for an annual Medicare Wellness physical exam. History of hypertension, liver hemangioma noted by MRI, and BPH. His last physical was in December 2015. No specific concerns today.  History of valvular heart disease, PAD, abnormal EKG: He has a history of heart murmur with valvular heart disease, aortic sclerosis, and mild regurgitation based on June 2014 Echo, EF 65%. Low-risk stress test December 2014. There was a plan for peripheral angiogram/PTA per Dr. Gwenlyn Found, but this had been postponed in September last year due to financial reasons. Plan for follow-up with cardiologist once he had a new Medicare plan. He had a lower extremity arterial scan in December 2014, which showed fairly significant PAD. RABI 0.29, LABI 0.66. Considering possible angiography, but not sure if there was much in the way of revascularization options. He states he had been told there was nothing that could be done for his right leg, so he does not want to follow-up with cardiology.  History of vitamin D deficiency: Low at 55, May of 2014 as well as September last year. Advised to take a multivitamin once a day.   Cancer screening:  Colon cancer screening - He has not had a colonoscopy. Did have 3 negative fecal occult blood tests October of last year. He does not want to have a colonoscopy or fecal occult blood tests today. Prostate cancer screening - History of BPH. No prior reading available for review. He agrees to have prostate cancer screening today. He was discharged by his urologist about a year ago.  Patient denies difficulty urinating, nocturia, and incontinence. Lab Results  Component Value Date   PSA 5.76* 10/17/2012    Immunizations: He is due for Prevnar and tetanus. He is UTD on flu vaccine. He will defer the Prevnar and tetanus at this time.   Immunization History  Administered Date(s) Administered  . Influenza Split 02/08/2013, 02/03/2014, 03/05/2015  . Pneumococcal Polysaccharide-23 03/04/2014    Depression screening:  Depression screen [facility] 2/9 05/10/2015 03/04/2014 09/26/2013  Decreased Interest 0 0 0  Down, Depressed, Hopeless 0 0 0  PHQ - 2 Score 0 0 0   Fall screening: Denies falls in the past year.  Functional status screening: Positive for sometimes difficulty remembering. Positive for some difficulty climbing stairs. Other responses negative per screening tool. He does not feel his memory has changed over the past year to the point of concern. Patient notes his legs sometime give out on him due to PAD, which is why he has some difficulty climbing stairs. He does fine getting around the house, but has difficulty walking longer distances.  Hearing screening: Denies difficulty hearing.  Vision: Overall appears stable compared to September 2015.   Visual Acuity Screening   Right eye Left eye Both eyes  Without correction:     With correction: 20/20 20/50 20/20     Dentist: He has seen a dentist in the past year at [place], but was quoted $3,200 for dentures so he did not have them done. He plans to try to [facility] clinic in March  or April next year.  Physical activity/exercise: Patient is limited by his legs.  Advanced directives: He does not have a living will.   Hypertension: Patient is on lisinopril-HCTZ 10-12.5 mg and has not had any side-effects with it.  History of hernia repair in 1980s: Swollen area on right groin that has been there for years. No changes recently. Notices some burning sensation if he stands for a long time, but otherwise  not bothersome. No bowel changes. No blood in stool.  Patient Active Problem List   Diagnosis Date Noted  . Essential hypertension 05/08/2013  . Claudication of calf muscles ([place]) 05/06/2013  . PAD (peripheral artery disease) ([place]) 05/05/2013  . Family history of early CAD 04/14/2013  . DOE (dyspnea on exertion) 04/14/2013  . Abnormal EKG 04/14/2013  . Calculus of bile duct without mention of cholecystitis or obstruction 11/27/2012  . Abdominal mass, right upper quadrant 11/18/2012  . Liver hemangioma 11/18/2012  . History of BPH 10/20/2012  . Valvular heart disease 10/17/2012   Past Medical History  Diagnosis Date  . BPH (benign prostatic hyperplasia)   . Exertional dyspnea     with exertion  . Hypertension   . Mitral valvular regurgitation June 2014    Mild to moderate MR on echocardiogram  . PAD (peripheral artery disease) ([place])  December 2014    Lower extremity Dopplers/ABIs: RABI 0.29, LABI 0.66; bilateral external iliacs with significant diameter reduction; R. SFA occlusive disease throughout into popliteal artery with 0 vessel runoff. Anterior tibial reconstitutes distally. 70-99% reduction in all SFA. One-vessel runoff (anterior tibial)  . Cataract    Past Surgical History  Procedure Laterality Date  . Ercp N/A 11/27/2012    Procedure: ENDOSCOPIC RETROGRADE CHOLANGIOPANCREATOGRAPHY (ERCP);  Surgeon: Inda Castle, MD;  Location: [facility];  Service: Endoscopy;  Laterality: N/A;  . Eye surgery Right   . Cholecystectomy  07/04/2010  . Prostate surgery      Dr. Lowella Bandy  . Nm myoview ltd  December 2014    Apical thinning but no ischemia. EF 55%  . Transthoracic echocardiogram  June 2014    Basal septal thickening with moderate LVH. EF 65%. Normal wall motion. Diastolic dysfunction/NOS. Aortic sclerosis without stenosis. Mild to moderate MR  . Lower extremity angiogram N/A 07/07/2013    Procedure: LOWER EXTREMITY ANGIOGRAM;  Surgeon: Lorretta Harp, MD;  Location: [facility]  CATH LAB;  Service: Cardiovascular;  Laterality: N/A;  . Left heart catheterization with coronary angiogram N/A 07/07/2013    Procedure: LEFT HEART CATHETERIZATION WITH CORONARY ANGIOGRAM;  Surgeon: Lorretta Harp, MD;  Location: [facility] CATH LAB;  Service: Cardiovascular;  Laterality: N/A;  . Abdominal angiogram  07/07/2013    Procedure: ABDOMINAL ANGIOGRAM;  Surgeon: Lorretta Harp, MD;  Location: [facility] CATH LAB;  Service: Cardiovascular;;   No Known Allergies Prior to Admission medications   Medication Sig Start Date End Date Taking? Authorizing Provider  lisinopril-hydrochlorothiazide (PRINZIDE,ZESTORETIC) 10-12.5 MG per tablet Take 1 tablet by mouth daily. 09/02/14  Yes Barton Fanny, MD   Social History   Social History  . Marital Status: Single    Spouse Name: N/A  . Number of Children: N/A  . Years of Education: N/A   Occupational History  . Retired    Social History Main Topics  . Smoking status: Former Smoker    Types: Cigarettes    Quit date: 11/25/1980  . Smokeless tobacco: Never Used  . Alcohol Use: 2.4 oz/week    4 Standard drinks or  equivalent per week     Comment: beers  . Drug Use: No  . Sexual Activity: No   Other Topics Concern  . Not on file   Social History Narrative   He is originally from [place]. He has a history of long-standing tobacco use but quit several years ago. He also has a history of former alcohol abuse as well.   He is retired and currently single.     Review of Systems 13 point ROS reviewed on patient health survey. Negative other than listed above or in nursing note. See nursing note.     Objective:   Physical Exam  Constitutional: He is oriented to person, place, and time. He appears well-developed and well-nourished.  HENT:  Head: Normocephalic and atraumatic.  Right Ear: External ear normal.  Left Ear: External ear normal.  Mouth/Throat: Oropharynx is clear and moist.  Multiple missing teeth with dental caries.  Eyes:  Conjunctivae and EOM are normal. Pupils are equal, round, and reactive to light.  Neck: Normal range of motion. Neck supple. No thyromegaly present.  Cardiovascular: Normal rate, regular rhythm, normal heart sounds and intact distal pulses.   Difficulty palpating DP pulse bilaterally.  Pulmonary/Chest: Effort normal and breath sounds normal. No respiratory distress. He has no wheezes.  Abdominal: Soft. He exhibits no distension. There is no tenderness. A hernia is present. Hernia confirmed positive in the right inguinal area (easily reducible hernia on right. nontender. ). Hernia confirmed negative in the left inguinal area.  Genitourinary:    Prostate is enlarged. Prostate is not tender.  Firm, enlarged prostate without apparent nodules.   Musculoskeletal: Normal range of motion. He exhibits edema. He exhibits no tenderness.  Non-pitting pedal edema.  Lymphadenopathy:    He has no cervical adenopathy.  Neurological: He is alert and oriented to person, place, and time. He has normal reflexes.  NVI distally.  Skin: Skin is warm and dry.  Hyperpigmentation of ankles with stasis changes. Toes are cool to the touch.  Psychiatric: He has a normal mood and affect. His behavior is normal.  Vitals reviewed.     Filed Vitals:   05/10/15 1415  BP: 130/80  Pulse: 73  Temp: 98 F (36.7 C)  TempSrc: Oral  Resp: 16  Height: 5' 4.75" (1.645 m)  Weight: 172 lb 12.8 oz (78.382 kg)  SpO2: 94%         Assessment & Plan:  [name] is a 81 y.o. male Medicare annual wellness visit, subsequent  --anticipatory guidance as below in AVS, screening labs above. Health maintenance items as above in HPI discussed/recommended as applicable.   -declined referral for colonoscopy, hemosure negative in office.    Need for prophylactic vaccination against Streptococcus pneumoniae (pneumococcus)  -has had pneumovax, declined Prevnar at present. Info provided.   Essential hypertension - Plan:  COMPLETE METABOLIC PANEL WITH GFR, Lipid panel  -controlled.  No med changes.   Right inguinal hernia  -longstanding by history. Episodic burning sensation, but no recent changes and easily reducible. RTC/ER precautions if worsens. Handout given. Consider surgery eval.   Dental caries   -phone number for [facility] dental clinic in [place] as possible option.   PAD (peripheral artery disease) (HCC)  -severe on right. Recommended cardiology eval, as medications such as pletal may be option if not candidate for other intervention but declined at this time.   Screen for colon cancer - Plan: IFOBT POC (occult bld, rslt in office)  -negative.   BPH (benign prostatic  hyperplasia) - Plan: PSA, Medicare, pecial screening, prostate cancer - Plan: PSA, Medicare  -PSA pending. We discussed pros and cons of prostate cancer screening, and after this discussion, he chose to have screening done. PSA obtained, and no concerning findings on DRE. Suspected BPH, but can determine if other eval needed based on PSA.   Low serum vitamin D - Plan: Vitamin D, 25-hydroxy  - repeat level pending.   Follow up in 6 months - sooner if any changes in above sx's.   No orders of the defined types were placed in this encounter.   Patient Instructions  It appears you still need the Prevnar vaccine (2nd pneumonia vaccine).  If you would like this given, we can give this anytime you would like. See more information below. You did have one of the pneumonia vaccines last year.   It appears you do have a good amount of peripheral artery disease that may be limiting your walking. There may be some medication options instead of just an operation, so would recommend you meet with Dr. Gwenlyn Found (cardiologist) - let me know if you change your mind and I will be happy to refer you back to Dr. Gwenlyn Found.    You do have a hernia on the right side.  If any increased pain, change in your bowels, or it gets "stuck" - be seen in the emergency  room.  See information on this condition below.   See information below on other option for lower cost dental care in [place].   Return to the clinic or go to the nearest emergency room if any of your symptoms worsen or new symptoms occur.   Keeping you healthy  Get these tests  Blood pressure- Have your blood pressure checked once a year by your healthcare provider.  Normal blood pressure is 120/80  Weight- Have your body mass index (BMI) calculated to screen for obesity.  BMI is a measure of body fat based on height and weight. You can also calculate your own BMI at ViewBanking.si.  Cholesterol- Have your cholesterol checked every year.  Diabetes- Have your blood sugar checked regularly if you have high blood pressure, high cholesterol, have a family history of diabetes or if you are overweight.  Screening for Colon Cancer- Colonoscopy starting at age 68.  Screening may begin sooner depending on your family history and other health conditions. Follow up colonoscopy as directed by your Gastroenterologist.  Screening for Prostate Cancer- Both blood work (PSA) and a rectal exam help screen for Prostate Cancer.  Screening begins at age 2 with African-American men and at age 30 with Caucasian men.  Screening may begin sooner depending on your family history.  Take these medicines  Aspirin- One aspirin daily can help prevent Heart disease and Stroke.  Flu shot- Every fall.  Tetanus- Every 10 years.  Zostavax- Once after the age of 35 to prevent Shingles.  Pneumonia shot- Once after the age of 26; if you are younger than 42, ask your healthcare provider if you need a Pneumonia shot.  Take these steps  Don't smoke- If you do smoke, talk to your doctor about quitting.  For tips on how to quit, go to www.smokefree.gov or call 1-800-QUIT-NOW.  Be physically active- Exercise 5 days a week for at least 30 minutes.  If you are not already physically active start slow and  gradually work up to 30 minutes of moderate physical activity.  Examples of moderate activity include walking briskly, mowing the yard, dancing, swimming,  bicycling, etc.  Eat a healthy diet- Eat a variety of healthy food such as fruits, vegetables, low fat milk, low fat cheese, yogurt, lean meant, poultry, fish, beans, tofu, etc. For more information go to www.thenutritionsource.org  Drink alcohol in moderation- Limit alcohol intake to less than two drinks a day. Never drink and drive.  Dentist- Brush and floss twice daily; visit your dentist twice a year.  Depression- Your emotional health is as important as your physical health. If you're feeling down, or losing interest in things you would normally enjoy please talk to your healthcare provider.  Eye exam- Visit your eye doctor every year.  Safe sex- If you may be exposed to a sexually transmitted infection, use a condom.  Seat belts- Seat belts can save your life; always wear one.  Smoke/Carbon Monoxide detectors- These detectors need to be installed on the appropriate level of your home.  Replace batteries at least once a year.  Skin cancer- When out in the sun, cover up and use sunscreen 15 SPF or higher.  Violence- If anyone is threatening you, please tell your healthcare provider.  Living Will/ Health care power of attorney- Speak with your healthcare provider and family.  Pneumococcal Vaccine, Polyvalent suspension for injection What is this medicine? PNEUMOCOCCAL VACCINE (NEU mo KOK al vak SEEN) is a vaccine used to prevent pneumococcus bacterial infections. These bacteria can cause serious infections like pneumonia, meningitis, and blood infections. This vaccine will lower your chance of getting pneumonia. If you do get pneumonia, it can make your symptoms milder and your illness shorter. This vaccine will not treat an infection and will not cause infection. This vaccine is recommended for infants and young children, adults with  certain medical conditions, and adults 65 years or older. This medicine may be used for other purposes; ask your health care provider or pharmacist if you have questions. What should I tell my health care provider before I take this medicine? They need to know if you have any of these conditions: -bleeding problems -fever -immune system problems -an unusual or allergic reaction to pneumococcal vaccine, diphtheria toxoid, other vaccines, latex, other medicines, foods, dyes, or preservatives -pregnant or trying to get pregnant -breast-feeding How should I use this medicine? This vaccine is for injection into a muscle. It is given by a health care professional. A copy of Vaccine Information Statements will be given before each vaccination. Read this sheet carefully each time. The sheet may change frequently. Talk to your pediatrician regarding the use of this medicine in children. While this drug may be prescribed for children as young as 42 weeks old for selected conditions, precautions do apply. Overdosage: If you think you have taken too much of this medicine contact a poison control center or emergency room at once. NOTE: This medicine is only for you. Do not share this medicine with others. What if I miss a dose? It is important not to miss your dose. Call your doctor or health care professional if you are unable to keep an appointment. What may interact with this medicine? -medicines for cancer chemotherapy -medicines that suppress your immune function -steroid medicines like prednisone or cortisone This list may not describe all possible interactions. Give your health care provider a list of all the medicines, herbs, non-prescription drugs, or dietary supplements you use. Also tell them if you smoke, drink alcohol, or use illegal drugs. Some items may interact with your medicine. What should I watch for while using this medicine? Mild fever and  pain should go away in 3 days or less. Report  any unusual symptoms to your doctor or health care professional. What side effects may I notice from receiving this medicine? Side effects that you should report to your doctor or health care professional as soon as possible: -allergic reactions like skin rash, itching or hives, swelling of the face, lips, or tongue -breathing problems -confused -fast or irregular heartbeat -fever over 102 degrees F -seizures -unusual bleeding or bruising -unusual muscle weakness Side effects that usually do not require medical attention (report to your doctor or health care professional if they continue or are bothersome): -aches and pains -diarrhea -fever of 102 degrees F or less -headache -irritable -loss of appetite -pain, tender at site where injected -trouble sleeping This list may not describe all possible side effects. Call your doctor for medical advice about side effects. You may report side effects to FDA at 1-800-FDA-1088. Where should I keep my medicine? This does not apply. This vaccine is given in a clinic, pharmacy, doctor's office, or other health care setting and will not be stored at home. NOTE: This sheet is a summary. It may not cover all possible information. If you have questions about this medicine, talk to your doctor, pharmacist, or health care provider.    2016, Elsevier/Gold Standard. (2014-02-26 10:27:27)  [employer]  [address]  [place], [place] [address]  Phone [phone]  [facility] [place], [place], exemplifies the Government social research officer vision to improve the health and quality of life of all [place] by Regulatory affairs officer with a passion to care for the underserved and by leading the nation in community-based, service learning oral health education. We are committed to offering  comprehensive general dental services for adults, children and special needs patients in a safe, caring and professional setting.  Appointments: Our clinic is open Monday through Friday 8:00 a.m. until 5:00 p.m. The amount of time scheduled for an appointment depends on the patient's specific needs. We ask that you keep your appointed time for care or provide 24-hour notice of all appointment changes. Parents or legal guardians must accompany minor children.  Payment for Services: Medicaid and other insurance plans are welcome. Payment for services is due when services are rendered and may be made by cash or credit card. If you have dental insurance, we will assist you with your claim submission.   Emergencies: Emergency services will be provided Monday through Friday on a walk-in basis. Please arrive early for emergency services. After hours emergency services will be provided for patients of record as required.  Services:  Comprehensive General Dentistry  Children's Dentistry  Oral Surgery - Extractions  Root Canals  Sealants and Tooth Colored Fillings  Crowns and Bridges  Dentures and Partial Dentures  Implant Services  Periodontal Services and Cleanings  Cosmetic Tooth Whitening  Digital Radiography  3-D/Cone Beam Imaging   Hernia, Adult A hernia is the bulging of an organ or tissue through a weak spot in the muscles of the abdomen (abdominal wall). Hernias develop most often near the navel or groin. There are many kinds of hernias. Common kinds include:  Femoral hernia. This kind of hernia develops under the groin in the upper thigh area.  Inguinal hernia. This kind of hernia develops in the groin or scrotum.  Umbilical hernia. This kind of hernia develops near the navel.  Hiatal hernia. This kind of hernia causes part of the stomach to be pushed up into the chest.  Incisional hernia. This kind of hernia bulges through a scar from an abdominal surgery. CAUSES This condition  may be caused by:  Heavy lifting.  Coughing over a long period of time.  Straining to have a bowel movement.  An incision made during an abdominal surgery.  A birth defect (congenital defect).  Excess weight or obesity.  Smoking.  Poor nutrition.  Cystic fibrosis.  Excess fluid in the abdomen.  Undescended testicles. SYMPTOMS Symptoms of a hernia include:  A lump on the abdomen. This is the first sign of a hernia. The lump may become more obvious with standing, straining, or coughing. It may get bigger over time if it is not treated or if the condition causing it is not treated.  Pain. A hernia is usually painless, but it may become painful over time if treatment is delayed. The pain is usually dull and may get worse with standing or lifting heavy objects. Sometimes a hernia gets tightly squeezed in the weak spot (strangulated) or stuck there (incarcerated) and causes additional symptoms. These symptoms may include:  Vomiting.  Nausea.  Constipation.  Irritability. DIAGNOSIS A hernia may be diagnosed with:  A physical exam. During the exam your health care provider may ask you to cough or to make a specific movement, because a hernia is usually more visible when you move.  Imaging tests. These can include:  X-rays.  Ultrasound.  CT scan. TREATMENT A hernia that is small and painless may not need to be treated. A hernia that is large or painful may be treated with surgery. Inguinal hernias may be treated with surgery to prevent incarceration or strangulation. Strangulated hernias are always treated with surgery, because lack of blood to the trapped organ or tissue can cause it to die. Surgery to treat a hernia involves pushing the bulge back into place and repairing the weak part of the abdomen. HOME CARE INSTRUCTIONS  Avoid straining.  Do not lift anything heavier than 10 lb (4.5 kg).  Lift with your leg muscles, not your back muscles. This helps avoid  strain.  When coughing, try to cough gently.  Prevent constipation. Constipation leads to straining with bowel movements, which can make a hernia worse or cause a hernia repair to break down. You can prevent constipation by:  Eating a high-fiber diet that includes plenty of fruits and vegetables.  Drinking enough fluids to keep your urine clear or pale yellow. Aim to drink 6-8 glasses of water per day.  Using a stool softener as directed by your health care provider.  Lose weight, if you are overweight.  Do not use any tobacco products, including cigarettes, chewing tobacco, or electronic cigarettes. If you need help quitting, ask your health care provider.  Keep all follow-up visits as directed by your health care provider. This is important. Your health care provider may need to monitor your condition. SEEK MEDICAL CARE IF:  You have swelling, redness, and pain in the affected area.  Your bowel habits change. SEEK IMMEDIATE MEDICAL CARE IF:  You have a fever.  You have abdominal pain that is getting worse.  You feel nauseous or you vomit.  You cannot push the hernia back in place by gently pressing on it while you are lying down.  The hernia:  Changes in shape or size.  Is stuck outside the abdomen.  Becomes discolored.  Feels hard or tender.  This information is not intended to replace advice given to you by your health care provider. Make sure you discuss any questions you have with your health care provider.   Document Released: 05/22/2005 Document Revised: 06/12/2014 Document Reviewed: 04/01/2014 Elsevier Interactive Patient Education [employer].    'I personally performed the services described in this documentation, which was scribed in my presence. The recorded information has been reviewed and considered, and addended by me as needed.

## 2015-05-10 NOTE — Progress Notes (Signed)
   Subjective:    Patient ID: [name], male    DOB: January 13, 1930, 81 y.o.   MRN: [id number]  HPI    Review of Systems  Constitutional: Negative.   HENT: Positive for dental problem.   Eyes: Negative.   Respiratory: Negative.   Cardiovascular: Negative.   Gastrointestinal: Negative.   Endocrine: Negative.   Genitourinary: Negative.   Musculoskeletal: Negative.   Skin: Negative.   Allergic/Immunologic: Negative.   Neurological: Negative.   Hematological: Negative.   Psychiatric/Behavioral: Negative.        Objective:   Physical Exam        Assessment & Plan:

## 2015-05-10 NOTE — Patient Instructions (Addendum)
It appears you still need the Prevnar vaccine (2nd pneumonia vaccine).  If you would like this given, we can give this anytime you would like. See more information below. You did have one of the pneumonia vaccines last year.   It appears you do have a good amount of peripheral artery disease that may be limiting your walking. There may be some medication options instead of just an operation, so would recommend you meet with Dr. Gwenlyn Found (cardiologist) - let me know if you change your mind and I will be happy to refer you back to Dr. Gwenlyn Found.    You do have a hernia on the right side.  If any increased pain, change in your bowels, or it gets "stuck" - be seen in the emergency room.  See information on this condition below.   See information below on other option for lower cost dental care in [place].   Return to the clinic or go to the nearest emergency room if any of your symptoms worsen or new symptoms occur.   Keeping you healthy  Get these tests  Blood pressure- Have your blood pressure checked once a year by your healthcare provider.  Normal blood pressure is 120/80  Weight- Have your body mass index (BMI) calculated to screen for obesity.  BMI is a measure of body fat based on height and weight. You can also calculate your own BMI at ViewBanking.si.  Cholesterol- Have your cholesterol checked every year.  Diabetes- Have your blood sugar checked regularly if you have high blood pressure, high cholesterol, have a family history of diabetes or if you are overweight.  Screening for Colon Cancer- Colonoscopy starting at age 72.  Screening may begin sooner depending on your family history and other health conditions. Follow up colonoscopy as directed by your Gastroenterologist.  Screening for Prostate Cancer- Both blood work (PSA) and a rectal exam help screen for Prostate Cancer.  Screening begins at age 49 with African-American men and at age 47 with Caucasian men.  Screening may  begin sooner depending on your family history.  Take these medicines  Aspirin- One aspirin daily can help prevent Heart disease and Stroke.  Flu shot- Every fall.  Tetanus- Every 10 years.  Zostavax- Once after the age of 29 to prevent Shingles.  Pneumonia shot- Once after the age of 73; if you are younger than 43, ask your healthcare provider if you need a Pneumonia shot.  Take these steps  Don't smoke- If you do smoke, talk to your doctor about quitting.  For tips on how to quit, go to www.smokefree.gov or call 1-800-QUIT-NOW.  Be physically active- Exercise 5 days a week for at least 30 minutes.  If you are not already physically active start slow and gradually work up to 30 minutes of moderate physical activity.  Examples of moderate activity include walking briskly, mowing the yard, dancing, swimming, bicycling, etc.  Eat a healthy diet- Eat a variety of healthy food such as fruits, vegetables, low fat milk, low fat cheese, yogurt, lean meant, poultry, fish, beans, tofu, etc. For more information go to www.thenutritionsource.org  Drink alcohol in moderation- Limit alcohol intake to less than two drinks a day. Never drink and drive.  Dentist- Brush and floss twice daily; visit your dentist twice a year.  Depression- Your emotional health is as important as your physical health. If you're feeling down, or losing interest in things you would normally enjoy please talk to your healthcare provider.  Eye exam- Visit your  eye doctor every year.  Safe sex- If you may be exposed to a sexually transmitted infection, use a condom.  Seat belts- Seat belts can save your life; always wear one.  Smoke/Carbon Monoxide detectors- These detectors need to be installed on the appropriate level of your home.  Replace batteries at least once a year.  Skin cancer- When out in the sun, cover up and use sunscreen 15 SPF or higher.  Violence- If anyone is threatening you, please tell your healthcare  provider.  Living Will/ Health care power of attorney- Speak with your healthcare provider and family.  Pneumococcal Vaccine, Polyvalent suspension for injection What is this medicine? PNEUMOCOCCAL VACCINE (NEU mo KOK al vak SEEN) is a vaccine used to prevent pneumococcus bacterial infections. These bacteria can cause serious infections like pneumonia, meningitis, and blood infections. This vaccine will lower your chance of getting pneumonia. If you do get pneumonia, it can make your symptoms milder and your illness shorter. This vaccine will not treat an infection and will not cause infection. This vaccine is recommended for infants and young children, adults with certain medical conditions, and adults 37 years or older. This medicine may be used for other purposes; ask your health care provider or pharmacist if you have questions. What should I tell my health care provider before I take this medicine? They need to know if you have any of these conditions: -bleeding problems -fever -immune system problems -an unusual or allergic reaction to pneumococcal vaccine, diphtheria toxoid, other vaccines, latex, other medicines, foods, dyes, or preservatives -pregnant or trying to get pregnant -breast-feeding How should I use this medicine? This vaccine is for injection into a muscle. It is given by a health care professional. A copy of Vaccine Information Statements will be given before each vaccination. Read this sheet carefully each time. The sheet may change frequently. Talk to your pediatrician regarding the use of this medicine in children. While this drug may be prescribed for children as young as 52 weeks old for selected conditions, precautions do apply. Overdosage: If you think you have taken too much of this medicine contact a poison control center or emergency room at once. NOTE: This medicine is only for you. Do not share this medicine with others. What if I miss a dose? It is important  not to miss your dose. Call your doctor or health care professional if you are unable to keep an appointment. What may interact with this medicine? -medicines for cancer chemotherapy -medicines that suppress your immune function -steroid medicines like prednisone or cortisone This list may not describe all possible interactions. Give your health care provider a list of all the medicines, herbs, non-prescription drugs, or dietary supplements you use. Also tell them if you smoke, drink alcohol, or use illegal drugs. Some items may interact with your medicine. What should I watch for while using this medicine? Mild fever and pain should go away in 3 days or less. Report any unusual symptoms to your doctor or health care professional. What side effects may I notice from receiving this medicine? Side effects that you should report to your doctor or health care professional as soon as possible: -allergic reactions like skin rash, itching or hives, swelling of the face, lips, or tongue -breathing problems -confused -fast or irregular heartbeat -fever over 102 degrees F -seizures -unusual bleeding or bruising -unusual muscle weakness Side effects that usually do not require medical attention (report to your doctor or health care professional if they continue or are  bothersome): -aches and pains -diarrhea -fever of 102 degrees F or less -headache -irritable -loss of appetite -pain, tender at site where injected -trouble sleeping This list may not describe all possible side effects. Call your doctor for medical advice about side effects. You may report side effects to FDA at 1-800-FDA-1088. Where should I keep my medicine? This does not apply. This vaccine is given in a clinic, pharmacy, doctor's office, or other health care setting and will not be stored at home. NOTE: This sheet is a summary. It may not cover all possible information. If you have questions about this medicine, talk to your  doctor, pharmacist, or health care provider.    2016, Elsevier/Gold Standard. (2014-02-26 10:27:27)  [facility]  [address]  [place], [place] [address]  Phone [phone]  [facility] in [place], [place], exemplifies the Government social research officer vision to improve the health and quality of life of all [place] by Regulatory affairs officer with a passion to care for the underserved and by leading the nation in community-based, service learning oral health education. We are committed to offering comprehensive general dental services for adults, children and special needs patients in a safe, caring and professional setting.  Appointments: Our clinic is open Monday through Friday 8:00 a.m. until 5:00 p.m. The amount of time scheduled for an appointment depends on the patient's specific needs. We ask that you keep your appointed time for care or provide 24-hour notice of all appointment changes. Parents or legal guardians must accompany minor children.  Payment for Services: Medicaid and other insurance plans are welcome. Payment for services is due when services are rendered and may be made by cash or credit card. If you have dental insurance, we will assist you with your claim submission.   Emergencies: Emergency services will be provided Monday through Friday on a walk-in basis. Please arrive early for emergency services. After hours emergency services will be provided for patients of record as required.  Services:  Comprehensive General Dentistry  Children's Dentistry  Oral Surgery - Extractions  Root Canals  Sealants and Tooth Colored Fillings  Crowns and Bridges  Dentures and Partial Dentures  Implant Services  Periodontal Services and Cleanings  Cosmetic Tooth Whitening  Digital Radiography  3-D/Cone Beam  Imaging   Hernia, Adult A hernia is the bulging of an organ or tissue through a weak spot in the muscles of the abdomen (abdominal wall). Hernias develop most often near the navel or groin. There are many kinds of hernias. Common kinds include:  Femoral hernia. This kind of hernia develops under the groin in the upper thigh area.  Inguinal hernia. This kind of hernia develops in the groin or scrotum.  Umbilical hernia. This kind of hernia develops near the navel.  Hiatal hernia. This kind of hernia causes part of the stomach to be pushed up into the chest.  Incisional hernia. This kind of hernia bulges through a scar from an abdominal surgery. CAUSES This condition may be caused by:  Heavy lifting.  Coughing over a long period of time.  Straining to have a bowel movement.  An incision made during an abdominal surgery.  A birth defect (congenital defect).  Excess weight or obesity.  Smoking.  Poor nutrition.  Cystic fibrosis.  Excess fluid in the abdomen.  Undescended testicles. SYMPTOMS Symptoms of a hernia include:  A lump on  the abdomen. This is the first sign of a hernia. The lump may become more obvious with standing, straining, or coughing. It may get bigger over time if it is not treated or if the condition causing it is not treated.  Pain. A hernia is usually painless, but it may become painful over time if treatment is delayed. The pain is usually dull and may get worse with standing or lifting heavy objects. Sometimes a hernia gets tightly squeezed in the weak spot (strangulated) or stuck there (incarcerated) and causes additional symptoms. These symptoms may include:  Vomiting.  Nausea.  Constipation.  Irritability. DIAGNOSIS A hernia may be diagnosed with:  A physical exam. During the exam your health care provider may ask you to cough or to make a specific movement, because a hernia is usually more visible when you move.  Imaging tests. These  can include:  X-rays.  Ultrasound.  CT scan. TREATMENT A hernia that is small and painless may not need to be treated. A hernia that is large or painful may be treated with surgery. Inguinal hernias may be treated with surgery to prevent incarceration or strangulation. Strangulated hernias are always treated with surgery, because lack of blood to the trapped organ or tissue can cause it to die. Surgery to treat a hernia involves pushing the bulge back into place and repairing the weak part of the abdomen. HOME CARE INSTRUCTIONS  Avoid straining.  Do not lift anything heavier than 10 lb (4.5 kg).  Lift with your leg muscles, not your back muscles. This helps avoid strain.  When coughing, try to cough gently.  Prevent constipation. Constipation leads to straining with bowel movements, which can make a hernia worse or cause a hernia repair to break down. You can prevent constipation by:  Eating a high-fiber diet that includes plenty of fruits and vegetables.  Drinking enough fluids to keep your urine clear or pale yellow. Aim to drink 6-8 glasses of water per day.  Using a stool softener as directed by your health care provider.  Lose weight, if you are overweight.  Do not use any tobacco products, including cigarettes, chewing tobacco, or electronic cigarettes. If you need help quitting, ask your health care provider.  Keep all follow-up visits as directed by your health care provider. This is important. Your health care provider may need to monitor your condition. SEEK MEDICAL CARE IF:  You have swelling, redness, and pain in the affected area.  Your bowel habits change. SEEK IMMEDIATE MEDICAL CARE IF:  You have a fever.  You have abdominal pain that is getting worse.  You feel nauseous or you vomit.  You cannot push the hernia back in place by gently pressing on it while you are lying down.  The hernia:  Changes in shape or size.  Is stuck outside the  abdomen.  Becomes discolored.  Feels hard or tender.   This information is not intended to replace advice given to you by your health care provider. Make sure you discuss any questions you have with your health care provider.   Document Released: 05/22/2005 Document Revised: 06/12/2014 Document Reviewed: 04/01/2014 Elsevier Interactive Patient Education [employer].

## 2015-05-11 LAB — PSA, MEDICARE: PSA: 4.62 ng/mL — AB (ref <=4.00)

## 2015-05-11 LAB — VITAMIN D 25 HYDROXY (VIT D DEFICIENCY, FRACTURES): Vit D, 25-Hydroxy: 25 ng/mL — ABNORMAL LOW (ref 30–100)

## 2015-05-17 ENCOUNTER — Ambulatory Visit (INDEPENDENT_AMBULATORY_CARE_PROVIDER_SITE_OTHER): Payer: Medicare Other

## 2015-05-17 ENCOUNTER — Ambulatory Visit (INDEPENDENT_AMBULATORY_CARE_PROVIDER_SITE_OTHER): Payer: Medicare Other | Admitting: Family Medicine

## 2015-05-17 VITALS — BP 134/72 | HR 93 | Temp 98.0°F | Resp 18 | Ht 64.75 in | Wt 171.0 lb

## 2015-05-17 DIAGNOSIS — K4090 Unilateral inguinal hernia, without obstruction or gangrene, not specified as recurrent: Secondary | ICD-10-CM | POA: Diagnosis not present

## 2015-05-17 DIAGNOSIS — M545 Low back pain, unspecified: Secondary | ICD-10-CM

## 2015-05-17 NOTE — Progress Notes (Signed)
[facility] [address] [place] [address] [phone]  Date:  05/17/2015   Name:  [name]   DOB:  [birth year]   MRN:  [id number]  PCP:  Ellsworth Lennox, MD    Chief Complaint: Back Pain and Hernia   History of Present Illness:  [name] is a 81 y.o. very pleasant male patient who presents with the following:   Here today with complaint of lower back pain- he has noted this for about 24 hours. He is not aware of any cause os the pain.  No numbness or weakness in the leg, the pain does not go down his legs.  He decided to flip his mattress yesterday but this did not help him so he came in to be seen   He notes that Dr. Carlota Raspberry told him he was getting a right inguinal hernia at his PE earlier this month- this bothers him just on occasion if he stands or walks too long.  This has not changed however. At this time he is not interested in operative repair   So far he has not tried ANY medication for his back  Patient Active Problem List   Diagnosis Date Noted  . Essential hypertension 05/08/2013  . Claudication of calf muscles ([place]) 05/06/2013  . PAD (peripheral artery disease) ([place]) 05/05/2013  . Family history of early CAD 04/14/2013  . DOE (dyspnea on exertion) 04/14/2013  . Abnormal EKG 04/14/2013  . Calculus of bile duct without mention of cholecystitis or obstruction 11/27/2012  . Abdominal mass, right upper quadrant 11/18/2012  . Liver hemangioma 11/18/2012  . History of BPH 10/20/2012  . Valvular heart disease 10/17/2012    Past Medical History  Diagnosis Date  . BPH (benign prostatic hyperplasia)   . Exertional dyspnea     with exertion  . Hypertension   . Mitral valvular regurgitation June 2014    Mild to moderate MR on echocardiogram  . PAD (peripheral artery disease) ([place])  December 2014    Lower extremity Dopplers/ABIs: RABI 0.29, LABI 0.66; bilateral external iliacs with significant diameter reduction; R. SFA occlusive  disease throughout into popliteal artery with 0 vessel runoff. Anterior tibial reconstitutes distally. 70-99% reduction in all SFA. One-vessel runoff (anterior tibial)  . Cataract     Past Surgical History  Procedure Laterality Date  . Ercp N/A 11/27/2012    Procedure: ENDOSCOPIC RETROGRADE CHOLANGIOPANCREATOGRAPHY (ERCP);  Surgeon: Inda Castle, MD;  Location: [facility];  Service: Endoscopy;  Laterality: N/A;  . Eye surgery Right   . Cholecystectomy  07/04/2010  . Prostate surgery      Dr. Lowella Bandy  . Nm myoview ltd  December 2014    Apical thinning but no ischemia. EF 55%  . Transthoracic echocardiogram  June 2014    Basal septal thickening with moderate LVH. EF 65%. Normal wall motion. Diastolic dysfunction/NOS. Aortic sclerosis without stenosis. Mild to moderate MR  . Lower extremity angiogram N/A 07/07/2013    Procedure: LOWER EXTREMITY ANGIOGRAM;  Surgeon: Lorretta Harp, MD;  Location: [facility] CATH LAB;  Service: Cardiovascular;  Laterality: N/A;  . Left heart catheterization with coronary angiogram N/A 07/07/2013    Procedure: LEFT HEART CATHETERIZATION WITH CORONARY ANGIOGRAM;  Surgeon: Lorretta Harp, MD;  Location[facility] C[facility] LAB;  Service: Cardiovascular;  Laterality: N/A;  . Abdominal angiogram  07/07/2013    Procedure: ABDOMINAL ANGIOGRAM;  Surgeon: Lorretta Harp, MD;  Location: [facility] CATH LAB;  Service: Cardiovascular;;  Social History  Substance Use Topics  . Smoking status: Former Smoker    Types: Cigarettes    Quit date: 11/25/1980  . Smokeless tobacco: Never Used  . Alcohol Use: 2.4 oz/week    4 Standard drinks or equivalent per week     Comment: beers    Family History  Problem Relation Age of Onset  . Diabetes Sister   . Cancer Sister   . Cancer Mother   . Cancer Father   . Cancer Sister     No Known Allergies  Medication list has been reviewed and updated.  Current Outpatient Prescriptions on File Prior to Visit  Medication Sig Dispense Refill  .  lisinopril-hydrochlorothiazide (PRINZIDE,ZESTORETIC) 10-12.5 MG per tablet Take 1 tablet by mouth daily. 90 tablet 3   No current facility-administered medications on file prior to visit.    Review of Systems:  As per HPI- otherwise negative.  Physical Examination: Filed Vitals:   05/17/15 1103  BP: 134/72  Pulse: 93  Temp: 98 F (36.7 C)  Resp: 18   Filed Vitals:   05/17/15 1103  Height: 5' 4.75" (1.645 m)  Weight: 171 lb (77.565 kg)   Body mass index is 28.66 kg/(m^2). Ideal Body Weight: Weight in (lb) to have BMI = 25: 148.8  GEN: WDWN, NAD, Non-toxic, A & O x 3, older man who uses a cane  HEENT: Atraumatic, Normocephalic. Neck supple. No masses, No LAD. Ears and Nose: No external deformity. CV: RRR, No M/G/R. No JVD. No thrill. No extra heart sounds. PULM: CTA B, no wheezes, crackles, rhonchi. No retractions. No resp. distress. No accessory muscle use. ABD: S, NT, ND EXTR: No c/c/e NEURO slow gait, uses a cane PSYCH: Normally interactive. Conversant. Not depressed or anxious appearing.  Calm demeanor.  No numbness or weakness of either LE  No RLQ tenderness - he does have a right inguinal hernia which is difficult to reduce while he is standing but is non- tender.  Did not have pt lie down today as his back hurts and he is having a hard time changing positions from sitting to standing.   He is tender over the lower lumbar vertebrae, midline.  No muscular tenderness.  Stiff to lumbar ROM.  Normal BLE strength, sensation and DTR  [facility] reading (PRIMARY) by  Dr. Lorelei Pont. L spine:  Moderate degenerative change but no definite compression fracture.    LUMBAR SPINE - COMPLETE 4+ VIEW  COMPARISON: Abdominal and pelvic CT scan dated June 10, 2010  FINDINGS: The lumbar vertebral bodies are preserved in height. There is disc space narrowing at L4-5 which is stable. There are lateral near bridging osteophytes at L2-3 on the left. There is no spondylolisthesis. There is  facet joint hypertrophy at L4-5 and at L5-S1. The pedicles and transverse processes are intact. The sacrum is unremarkable.  IMPRESSION: There is degenerative disc and facet joint change centered at L4-5. There is no compression fracture nor other acute bony abnormality.  Assessment and Plan: Midline low back pain without sciatica - Plan: DG Lumbar Spine Complete  Right inguinal hernia  As he has not yet tried any medication for his back pain suggested that he try tylenol.  He will do so and let me know if not improved. Discussed concerning signs that should prompt him to seek care regarding his hernia.   Signed Lamar Blinks, MD

## 2015-05-17 NOTE — Patient Instructions (Signed)
Try some tylenol for your pain- purchase a bottle of extra strength (500 mg) tablets.  You can take 1 or 2 up to three times a day as needed.  When you back stops hurting you can stop the tylenol.  However if your pain is not better in the next couple of days let me know and I can call in something stronger

## 2015-05-20 ENCOUNTER — Telehealth: Payer: Self-pay

## 2015-05-20 NOTE — Telephone Encounter (Signed)
Patient saw Dr. Lorelei Pont and she prescribed him some Tylenol which he took 6 per day for three days totaling 18 pills. He is still not feeling any better. Dr. Lorelei Pont said that she would call him in something stronger for pain if the Tylenol didn't help.

## 2015-05-21 NOTE — Telephone Encounter (Signed)
Called him back- apologized that I did not see his message yesterday.  However he reports that his pain is actually better today and he no longer needs anything else

## 2015-07-03 ENCOUNTER — Telehealth: Payer: Self-pay | Admitting: Family Medicine

## 2015-07-03 NOTE — Telephone Encounter (Signed)
lmom of pt new appt date [name] has moved from [place] to [facility] 11-10-2015 @ 8:00

## 2015-07-17 ENCOUNTER — Emergency Department (HOSPITAL_COMMUNITY)
Admission: EM | Admit: 2015-07-17 | Discharge: 2015-07-17 | Disposition: A | Discharge status: Home / Self Care | Payer: Medicare Other | Attending: Emergency Medicine | Admitting: Emergency Medicine

## 2015-07-17 ENCOUNTER — Ambulatory Visit (INDEPENDENT_AMBULATORY_CARE_PROVIDER_SITE_OTHER): Payer: Medicare Other | Admitting: Family Medicine

## 2015-07-17 ENCOUNTER — Encounter (HOSPITAL_COMMUNITY): Payer: Self-pay | Admitting: Emergency Medicine

## 2015-07-17 VITALS — BP 162/70 | HR 67 | Temp 98.7°F | Resp 20 | Ht 65.75 in | Wt 174.4 lb

## 2015-07-17 DIAGNOSIS — Z9889 Other specified postprocedural states: Secondary | ICD-10-CM | POA: Diagnosis not present

## 2015-07-17 DIAGNOSIS — K460 Unspecified abdominal hernia with obstruction, without gangrene: Secondary | ICD-10-CM

## 2015-07-17 DIAGNOSIS — R1031 Right lower quadrant pain: Secondary | ICD-10-CM

## 2015-07-17 DIAGNOSIS — Z87891 Personal history of nicotine dependence: Secondary | ICD-10-CM | POA: Insufficient documentation

## 2015-07-17 DIAGNOSIS — I10 Essential (primary) hypertension: Secondary | ICD-10-CM | POA: Insufficient documentation

## 2015-07-17 DIAGNOSIS — K4090 Unilateral inguinal hernia, without obstruction or gangrene, not specified as recurrent: Secondary | ICD-10-CM | POA: Insufficient documentation

## 2015-07-17 DIAGNOSIS — Z79899 Other long term (current) drug therapy: Secondary | ICD-10-CM | POA: Diagnosis not present

## 2015-07-17 DIAGNOSIS — Z8669 Personal history of other diseases of the nervous system and sense organs: Secondary | ICD-10-CM | POA: Diagnosis not present

## 2015-07-17 DIAGNOSIS — Z87438 Personal history of other diseases of male genital organs: Secondary | ICD-10-CM | POA: Insufficient documentation

## 2015-07-17 DIAGNOSIS — R1110 Vomiting, unspecified: Secondary | ICD-10-CM | POA: Diagnosis present

## 2015-07-17 LAB — CBC
HEMATOCRIT: 43 % (ref 39.0–52.0)
HEMOGLOBIN: 14 g/dL (ref 13.0–17.0)
MCH: 29.3 pg (ref 26.0–34.0)
MCHC: 32.6 g/dL (ref 30.0–36.0)
MCV: 90 fL (ref 78.0–100.0)
Platelets: 486 10*3/uL — ABNORMAL HIGH (ref 150–400)
RBC: 4.78 MIL/uL (ref 4.22–5.81)
RDW: 13.4 % (ref 11.5–15.5)
WBC: 6.6 10*3/uL (ref 4.0–10.5)

## 2015-07-17 LAB — COMPREHENSIVE METABOLIC PANEL
ALBUMIN: 3.6 g/dL (ref 3.5–5.0)
ALT: 11 U/L — ABNORMAL LOW (ref 17–63)
ANION GAP: 12 (ref 5–15)
AST: 17 U/L (ref 15–41)
Alkaline Phosphatase: 94 U/L (ref 38–126)
BUN: 12 mg/dL (ref 6–20)
CO2: 24 mmol/L (ref 22–32)
Calcium: 9.5 mg/dL (ref 8.9–10.3)
Chloride: 102 mmol/L (ref 101–111)
Creatinine, Ser: 0.84 mg/dL (ref 0.61–1.24)
GFR calc Af Amer: >60 mL/min (ref >60)
GFR calc non Af Amer: >60 mL/min (ref >60)
GLUCOSE: 129 mg/dL — AB (ref 65–99)
POTASSIUM: 3.6 mmol/L (ref 3.5–5.1)
SODIUM: 138 mmol/L (ref 135–145)
Total Bilirubin: 0.6 mg/dL (ref 0.3–1.2)
Total Protein: 6.4 g/dL — ABNORMAL LOW (ref 6.5–8.1)

## 2015-07-17 NOTE — ED Notes (Signed)
PCP sent to r/o strangulated vs incarcerated hernia, PCP called surgery to notify pt arrival

## 2015-07-17 NOTE — ED Provider Notes (Signed)
CSN: [id number]     Arrival date & time 07/17/15  1743 History   First MD Initiated Contact with Patient 07/17/15 1912     Chief Complaint  Patient presents with  . Inguinal Hernia      HPI Patient presented with a right inguinal hernia. He was sent in by his primary care doctor. States he has had a hernia there for a while but began to hurt this morning. States he did have one episode of vomiting. Primary care doctor saw him as well as real tender and sent him in for further evaluation. Reportedly also talked to general surgery. While driving here the patient had the pain resolved from his hernia. He states he can no longer feel it. He is still having bowel movements. He had a previous left inguinal hernia repair.   Past Medical History  Diagnosis Date  . BPH (benign prostatic hyperplasia)   . Exertional dyspnea     with exertion  . Hypertension   . Mitral valvular regurgitation June 2014    Mild to moderate MR on echocardiogram  . PAD (peripheral artery disease) ([place])  December 2014    Lower extremity Dopplers/ABIs: RABI 0.29, LABI 0.66; bilateral external iliacs with significant diameter reduction; R. SFA occlusive disease throughout into popliteal artery with 0 vessel runoff. Anterior tibial reconstitutes distally. 70-99% reduction in all SFA. One-vessel runoff (anterior tibial)  . Cataract    Past Surgical History  Procedure Laterality Date  . Ercp N/A 11/27/2012    Procedure: ENDOSCOPIC RETROGRADE CHOLANGIOPANCREATOGRAPHY (ERCP);  Surgeon: Inda Castle, MD;  Location: [facility];  Service: Endoscopy;  Laterality: N/A;  . Eye surgery Right   . Cholecystectomy  07/04/2010  . Prostate surgery      Dr. Lowella Bandy  . Nm myoview ltd  December 2014    Apical thinning but no ischemia. EF 55%  . Transthoracic echocardiogram  June 2014    Basal septal thickening with moderate LVH. EF 65%. Normal wall motion. Diastolic dysfunction/NOS. Aortic sclerosis without stenosis. Mild to  moderate MR  . Lower extremity angiogram N/A 07/07/2013    Procedure: LOWER EXTREMITY ANGIOGRAM;  Surgeon: Lorretta Harp, MD;  Location: [facility] CATH LAB;  Service: Cardiovascular;  Laterality: N/A;  . Left heart catheterization with coronary angiogram N/A 07/07/2013    Procedure: LEFT HEART CATHETERIZATION WITH CORONARY ANGIOGRAM;  Surgeon: Lorretta Harp, MD;  Location: [facility] CATH LAB;  Service: Cardiovascular;  Laterality: N/A;  . Abdominal angiogram  07/07/2013    Procedure: ABDOMINAL ANGIOGRAM;  Surgeon: Lorretta Harp, MD;  Location: [facility] CATH LAB;  Service: Cardiovascular;;   Family History  Problem Relation Age of Onset  . Diabetes Sister   . Cancer Sister   . Cancer Mother   . Cancer Father   . Cancer Sister    Social History  Substance Use Topics  . Smoking status: Former Smoker    Types: Cigarettes    Quit date: 11/25/1980  . Smokeless tobacco: Never Used  . Alcohol Use: 2.4 oz/week    4 Standard drinks or equivalent per week     Comment: beers    Review of Systems  Constitutional: Negative for activity change and appetite change.  Eyes: Negative for pain.  Respiratory: Negative for chest tightness and shortness of breath.   Cardiovascular: Negative for chest pain and leg swelling.  Gastrointestinal: Positive for nausea, vomiting and abdominal pain. Negative for diarrhea.  Genitourinary: Negative for flank pain.  Musculoskeletal: Negative for back pain and  neck stiffness.  Skin: Negative for rash.  Neurological: Negative for weakness, numbness and headaches.  Psychiatric/Behavioral: Negative for behavioral problems.      Allergies  Review of patient's allergies indicates no known allergies.  Home Medications   Prior to Admission medications   Medication Sig Start Date End Date Taking? Authorizing Provider  lisinopril-hydrochlorothiazide (PRINZIDE,ZESTORETIC) 10-12.5 MG per tablet Take 1 tablet by mouth daily. 09/02/14   Barton Fanny, MD   BP 133/95 mmHg   Pulse 80  Temp(Src) 98.6 F (37 C) (Oral)  Resp 18  Ht 5\' 5"  (1.651 m)  Wt 174 lb (78.926 kg)  BMI 28.96 kg/m2  SpO2 99% Physical Exam  Constitutional: He is oriented to person, place, and time. He appears well-developed and well-nourished.  HENT:  Head: Normocephalic and atraumatic.  Eyes: Pupils are equal, round, and reactive to light.  Cardiovascular: Normal rate and regular rhythm.   Abdominal: Soft. He exhibits no distension and no mass. There is no tenderness. There is no rebound and no guarding.  Defect right inguinal abdominal wall. Small amount of likely fat herniation. Reducible. No tenderness. No distention.  Neurological: He is alert and oriented to person, place, and time. No cranial nerve deficit.  Skin: Skin is warm.  Nursing note and vitals reviewed.   ED Course  Procedures (including critical care time) Labs Review Labs Reviewed  COMPREHENSIVE METABOLIC PANEL - Abnormal; Notable for the following:    Glucose, Bld 129 (*)    Total Protein 6.4 (*)    ALT 11 (*)    All other components within normal limits  CBC - Abnormal; Notable for the following:    Platelets 486 (*)    All other components within normal limits  URINALYSIS, ROUTINE W REFLEX MICROSCOPIC (NOT AT [facility])    Imaging Review No results found. I have personally reviewed and evaluated these images and lab results as part of my medical decision-making.   EKG Interpretation None      MDM   Final diagnoses:  Unilateral inguinal hernia without obstruction or gangrene, recurrence not specified    Patient with right-sided inguinal hernia. May have been incarcerated but has reduced spontaneously. Given hernia instructions. Will discharge home. Will follow-up with general surgery.    Davonna Belling, MD 07/17/15 [phone]

## 2015-07-17 NOTE — Progress Notes (Signed)
Subjective:  By signing my name below, I, Rawaa Al Rifaie, attest that this documentation has been prepared under the direction and in the presence of [name], MD.  Leandra Kern, Medical Scribe. 07/17/2015.  4:00 PM.   Patient ID: [name], male    DOB: Jun 03, 1930, 82 y.o.   MRN: [id number]  Chief Complaint  Patient presents with  . Inguinal Hernia    right side  . Emesis    today    HPI HPI Comments: [name] is a 82 y.o. male who presents to [facility] complaining of an inguinal hernia. Pt was seen by D. Deatra Ina previously in 12/2012 after ER CP and stone extraction for bile duct stones. He does having a hx of right inguinal hernia that was noted in his medicare exam in December. It was noted to be an easily reducible hernia t that time, and non tender. RTC/ER precautions were discussed.   Today, pt indicates that the area has been painful with a burning sensation. He notes that he has an episode of emesis around 2:20 pm today that gave his pain a mild relief, however the burning sensation is still present with the same quality. pt reports that last night he was moving furniture, though no symptoms experienced at that time.This morning around 10 am however, he started experiencing the symptoms after being in a standing position for a while. Pt states that he has not tried to push the hernia back in due to the pain and the presence of a lump that seems to be recent. Pt's last bowel movement was yesterday, and reports that it was watery in nature, and more frequent in quantity (3-4 episodes), however he notes that this is not new, and indicates that it is usually a certain types of food that he eats that exacerbate that. He denies blood in stool, or abdominal pain.    Patient Active Problem List   Diagnosis Date Noted  . Essential hypertension 05/08/2013  . Claudication of calf muscles ([place]) 05/06/2013  . PAD (peripheral artery disease) ([place])  05/05/2013  . Family history of early CAD 04/14/2013  . DOE (dyspnea on exertion) 04/14/2013  . Abnormal EKG 04/14/2013  . Calculus of bile duct without mention of cholecystitis or obstruction 11/27/2012  . Abdominal mass, right upper quadrant 11/18/2012  . Liver hemangioma 11/18/2012  . History of BPH 10/20/2012  . Valvular heart disease 10/17/2012   Past Medical History  Diagnosis Date  . BPH (benign prostatic hyperplasia)   . Exertional dyspnea     with exertion  . Hypertension   . Mitral valvular regurgitation June 2014    Mild to moderate MR on echocardiogram  . PAD (peripheral artery disease) ([place])  December 2014    Lower extremity Dopplers/ABIs: RABI 0.29, LABI 0.66; bilateral external iliacs with significant diameter reduction; R. SFA occlusive disease throughout into popliteal artery with 0 vessel runoff. Anterior tibial reconstitutes distally. 70-99% reduction in all SFA. One-vessel runoff (anterior tibial)  . Cataract    Past Surgical History  Procedure Laterality Date  . Ercp N/A 11/27/2012    Procedure: ENDOSCOPIC RETROGRADE CHOLANGIOPANCREATOGRAPHY (ERCP);  Surgeon: Inda Castle, MD;  Location: [facility];  Service: Endoscopy;  Laterality: N/A;  . Eye surgery Right   . Cholecystectomy  07/04/2010  . Prostate surgery      Dr. Lowella Bandy  . Nm myoview ltd  December 2014    Apical thinning but no ischemia. EF 55%  .  Transthoracic echocardiogram  June 2014    Basal septal thickening with moderate LVH. EF 65%. Normal wall motion. Diastolic dysfunction/NOS. Aortic sclerosis without stenosis. Mild to moderate MR  . Lower extremity angiogram N/A 07/07/2013    Procedure: LOWER EXTREMITY ANGIOGRAM;  Surgeon: Lorretta Harp, MD;  Location: [facility] CATH LAB;  Service: Cardiovascular;  Laterality: N/A;  . Left heart catheterization with coronary angiogram N/A 07/07/2013    Procedure: LEFT HEART CATHETERIZATION WITH CORONARY ANGIOGRAM;  Surgeon: Lorretta Harp, MD;  Location: [facility]  CATH LAB;  Service: Cardiovascular;  Laterality: N/A;  . Abdominal angiogram  07/07/2013    Procedure: ABDOMINAL ANGIOGRAM;  Surgeon: Lorretta Harp, MD;  Location: [facility] CATH LAB;  Service: Cardiovascular;;   No Known Allergies Prior to Admission medications   Medication Sig Start Date End Date Taking? Authorizing Provider  lisinopril-hydrochlorothiazide (PRINZIDE,ZESTORETIC) 10-12.5 MG per tablet Take 1 tablet by mouth daily. 09/02/14  Yes Barton Fanny, MD   Social History   Social History  . Marital Status: Single    Spouse Name: N/A  . Number of Children: N/A  . Years of Education: N/A   Occupational History  . Retired    Social History Main Topics  . Smoking status: Former Smoker    Types: Cigarettes    Quit date: 11/25/1980  . Smokeless tobacco: Never Used  . Alcohol Use: 2.4 oz/week    4 Standard drinks or equivalent per week     Comment: beers  . Drug Use: No  . Sexual Activity: No   Other Topics Concern  . Not on file   Social History Narrative   He is originally from [place]. He has a history of long-standing tobacco use but quit several years ago. He also has a history of former alcohol abuse as well.   He is retired and currently single.    Review of Systems  Gastrointestinal: Positive for diarrhea. Negative for abdominal pain.  Genitourinary: Negative for enuresis.       Objective:   Physical Exam  Constitutional: He is oriented to person, place, and time. He appears well-developed and well-nourished. No distress.  HENT:  Head: Normocephalic and atraumatic.  Eyes: EOM are normal. Pupils are equal, round, and reactive to light.  Neck: Neck supple.  Cardiovascular: Normal rate.   Pulmonary/Chest: Effort normal.  Abdominal:  Right inguina hernia. Larg, firm mass, tender to palpation. Unable to reduce.   Neurological: He is alert and oriented to person, place, and time. No cranial nerve deficit.  Skin: Skin is warm and dry.  Psychiatric: He has  a normal mood and affect. His behavior is normal.  Nursing note and vitals reviewed.   Filed Vitals:   07/17/15 1509  BP: 162/70  Pulse: 67  Temp: 98.7 F (37.1 C)  TempSrc: Oral  Resp: 20  Height: 5' 5.75" (1.67 m)  Weight: 174 lb 6.4 oz (79.107 kg)  SpO2: 97%      Assessment & Plan:  [name] is a 82 y.o. male Incarcerated hernia  Groin pain, right  Right inguinal hernia, previously easily reducible, now with increased pain, swelling, and inability to reduce since early this morning approximately 10 AM. Did have vomiting one time this afternoon 2:30 PM, and unable to get comfortable. Suspected incarcerated versus early strangulated hernia.  - Sent to [facility] ER, advised staff he was on the way, and will attempt to contact surgeon to advise as well.   No orders of the defined types were  placed in this encounter.   Patient Instructions  Go directly to the emergency room for further evaluation. I did let them know you're on the way.

## 2015-07-17 NOTE — Patient Instructions (Signed)
Go directly to the emergency room for further evaluation. I did let them know you're on the way.

## 2015-07-17 NOTE — ED Notes (Addendum)
[facility] further eval of possible incarcerated inguinal hernia. Pt reports symptoms ongoing for months but increased in pain recently.

## 2015-07-17 NOTE — Discharge Instructions (Signed)

## 2015-09-06 ENCOUNTER — Other Ambulatory Visit: Payer: Self-pay

## 2015-09-06 DIAGNOSIS — I10 Essential (primary) hypertension: Secondary | ICD-10-CM

## 2015-09-06 MED ORDER — LISINOPRIL-HYDROCHLOROTHIAZIDE 10-12.5 MG PO TABS: 1.0000 | ORAL_TABLET | Freq: Every day | ORAL | Status: DC

## 2015-11-08 ENCOUNTER — Ambulatory Visit: Payer: Medicare Other | Admitting: Family Medicine

## 2015-11-10 ENCOUNTER — Ambulatory Visit: Payer: Medicare Other | Admitting: Family Medicine

## 2015-11-11 ENCOUNTER — Encounter: Payer: Self-pay | Admitting: Family Medicine

## 2015-11-11 ENCOUNTER — Ambulatory Visit (INDEPENDENT_AMBULATORY_CARE_PROVIDER_SITE_OTHER): Payer: Medicare Other | Admitting: Family Medicine

## 2015-11-11 VITALS — BP 140/86 | HR 68 | Temp 98.3°F | Resp 16 | Ht 64.75 in | Wt 171.2 lb

## 2015-11-11 DIAGNOSIS — R7989 Other specified abnormal findings of blood chemistry: Secondary | ICD-10-CM | POA: Diagnosis not present

## 2015-11-11 DIAGNOSIS — Z23 Encounter for immunization: Secondary | ICD-10-CM

## 2015-11-11 DIAGNOSIS — R739 Hyperglycemia, unspecified: Secondary | ICD-10-CM | POA: Diagnosis not present

## 2015-11-11 DIAGNOSIS — I10 Essential (primary) hypertension: Secondary | ICD-10-CM

## 2015-11-11 LAB — COMPLETE METABOLIC PANEL WITH GFR
ALT: 8 U/L — ABNORMAL LOW (ref 9–46)
AST: 11 U/L (ref 10–35)
Albumin: 3.6 g/dL (ref 3.6–5.1)
Alkaline Phosphatase: 87 U/L (ref 40–115)
BUN: 11 mg/dL (ref 7–25)
CHLORIDE: 107 mmol/L (ref 98–110)
CO2: 28 mmol/L (ref 20–31)
Calcium: 8.7 mg/dL (ref 8.6–10.3)
Creat: 0.77 mg/dL (ref 0.70–1.11)
GFR, Est African American: >89 mL/min (ref >=60)
GFR, Est Non African American: 83 mL/min (ref >=60)
GLUCOSE: 82 mg/dL (ref 65–99)
POTASSIUM: 4.5 mmol/L (ref 3.5–5.3)
SODIUM: 142 mmol/L (ref 135–146)
Total Bilirubin: 0.4 mg/dL (ref 0.2–1.2)
Total Protein: 6.3 g/dL (ref 6.1–8.1)

## 2015-11-11 LAB — HEMOGLOBIN A1C
Hgb A1c MFr Bld: 5.7 % — ABNORMAL HIGH (ref <5.7)
MEAN PLASMA GLUCOSE: 117 mg/dL

## 2015-11-11 LAB — LIPID PANEL
CHOL/HDL RATIO: 2.9 ratio (ref <=5.0)
Cholesterol: 103 mg/dL — ABNORMAL LOW (ref 125–200)
HDL: 36 mg/dL — ABNORMAL LOW (ref >=40)
LDL Cholesterol: 51 mg/dL (ref <130)
Triglycerides: 80 mg/dL (ref <150)
VLDL: 16 mg/dL (ref <30)

## 2015-11-11 MED ORDER — LISINOPRIL-HYDROCHLOROTHIAZIDE 10-12.5 MG PO TABS: 1.0000 | ORAL_TABLET | Freq: Every day | ORAL | Status: DC

## 2015-11-11 NOTE — Progress Notes (Addendum)
Subjective:  By signing my name below, I, [name], attest that this documentation has been prepared under the direction and in the presence of David Ray, MD. Electronically Signed: Moises [name], Thomas. 11/11/2015 , 8:26 AM .  Patient was seen in Room 25 .   Patient ID: [name], male    DOB: 1930/03/29, 82 y.o.   MRN: [id number] Chief Complaint  Patient presents with  . Follow-up    [name] pressure   HPI [name] is a 82 y.o. male Here for follow up. He's fasting today.   HTN Lab Results  Component Value Date   CREATININE 0.84 07/17/2015   He takes lisinopril-hctz 10-12.5 mg qd. He denies missing any doses of his medications recently.   Hyperglycemia See last labs in February hospital visit, elevated at 129; but normal last December.   Low Vitamin D Level of 25 in December, was recommended IU vitamin D 800mg . Recheck in 3 months.  He denies taking any vitamin D supplements.   Health maintenance  Immunizations He has house call forms, prevnar 15, shingles and tetanus.  House call forms BP: 126/70 Immunization History  Administered Date(s) Administered  . Influenza Split 02/08/2013, 02/03/2014, 03/05/2015  . Pneumococcal Polysaccharide-23 03/04/2014   He declines the tetanus shot today.   Function Status ADL and IADL's intact.    Patient Active Problem List   Diagnosis Date Noted  . Essential hypertension 05/08/2013  . Claudication of calf muscles ([place]) 05/06/2013  . PAD (peripheral artery disease) ([place]) 05/05/2013  . Family history of early CAD 04/14/2013  . DOE (dyspnea on exertion) 04/14/2013  . Abnormal EKG 04/14/2013  . Calculus of bile duct without mention of cholecystitis or obstruction 11/27/2012  . Abdominal mass, right upper quadrant 11/18/2012  . Liver hemangioma 11/18/2012  . History of BPH 10/20/2012  . Valvular heart disease 10/17/2012   Past Medical History  Diagnosis Date  . BPH (benign prostatic hyperplasia)   .  Exertional dyspnea     with exertion  . Hypertension   . Mitral valvular regurgitation June 2014    Mild to moderate MR on echocardiogram  . PAD (peripheral artery disease) ([place])  December 2014    Lower extremity Dopplers/ABIs: RABI 0.29, LABI 0.66; bilateral external iliacs with significant diameter reduction; R. SFA occlusive disease throughout into popliteal artery with 0 vessel runoff. Anterior tibial reconstitutes distally. 70-99% reduction in all SFA. One-vessel runoff (anterior tibial)  . Cataract    Past Surgical History  Procedure Laterality Date  . Ercp N/A 11/27/2012    Procedure: ENDOSCOPIC RETROGRADE CHOLANGIOPANCREATOGRAPHY (ERCP);  Surgeon: David Castle, MD;  Location: [facility];  Service: Endoscopy;  Laterality: N/A;  . Eye surgery Right   . Cholecystectomy  07/04/2010  . Prostate surgery      Dr. Lowella Bandy  . Nm myoview ltd  December 2014    Apical thinning but no ischemia. EF 55%  . Transthoracic echocardiogram  June 2014    Basal septal thickening with moderate LVH. EF 65%. Normal wall motion. Diastolic dysfunction/NOS. Aortic sclerosis without stenosis. Mild to moderate MR  . Lower extremity angiogram N/A 07/07/2013    Procedure: LOWER EXTREMITY ANGIOGRAM;  Surgeon: David Harp, MD;  Location: [facility] CATH LAB;  Service: Cardiovascular;  Laterality: N/A;  . Left heart catheterization with coronary angiogram N/A 07/07/2013    Procedure: LEFT HEART CATHETERIZATION WITH CORONARY ANGIOGRAM;  Surgeon: David Harp, MD;  Location: [facility] CATH LAB;  Service: Cardiovascular;  Laterality:  N/A;  . Abdominal angiogram  07/07/2013    Procedure: ABDOMINAL ANGIOGRAM;  Surgeon: David Harp, MD;  Location: [facility] CATH LAB;  Service: Cardiovascular;;   No Known Allergies Prior to Admission medications   Medication Sig Start Date End Date Taking? Authorizing Provider  lisinopril-hydrochlorothiazide (PRINZIDE,ZESTORETIC) 10-12.5 MG tablet Take 1 tablet by mouth daily. 09/06/15  Yes  David Agreste, MD   Social History   Social History  . Marital Status: Single    Spouse Name: N/A  . Number of Children: N/A  . Years of Education: N/A   Occupational History  . Retired    Social History Main Topics  . Smoking status: Former Smoker    Types: Cigarettes    Quit date: 11/25/1980  . Smokeless tobacco: Never Used  . Alcohol Use: 2.4 oz/week    4 Standard drinks or equivalent per week     Comment: beers  . Drug Use: No  . Sexual Activity: No   Other Topics Concern  . Not on file   Social History Narrative   He is originally from [place]. He has a history of long-standing tobacco use but quit several years ago. He also has a history of former alcohol abuse as well.   He is retired and currently single.   Review of Systems  Constitutional: Negative for fatigue and unexpected weight change.  Eyes: Negative for visual disturbance.  Respiratory: Negative for cough, chest tightness and shortness of breath.   Cardiovascular: Negative for chest pain, palpitations and leg swelling.  Gastrointestinal: Negative for abdominal pain and [name] in stool.  Neurological: Negative for dizziness, light-headedness and headaches.       Objective:   Physical Exam  Constitutional: He is oriented to person, place, and time. He appears well-developed and well-nourished.  HENT:  Head: Normocephalic and atraumatic.  Eyes: EOM are normal. Pupils are equal, round, and reactive to light.  Neck: No JVD present. Carotid bruit is not present.  Cardiovascular: Normal rate, regular rhythm and normal heart sounds.   No murmur heard. Pulmonary/Chest: Effort normal and breath sounds normal. He has no rales.  Musculoskeletal: He exhibits no edema.  Neurological: He is alert and oriented to person, place, and time.  Skin: Skin is warm and dry.  Psychiatric: He has a normal mood and affect.  Vitals reviewed.   Filed Vitals:   11/11/15 0804  BP: 140/86  Pulse: 68  Temp: 98.3 F  (36.8 C)  TempSrc: Oral  Resp: 16  Height: 5' 4.75" (1.645 m)  Weight: 171 lb 3.2 oz (77.656 kg)  SpO2: 98%      Assessment & Plan:   [name] is a 82 y.o. male Essential hypertension - Plan: COMPLETE METABOLIC PANEL WITH GFR, Lipid panel  - stable. Tolerating meds without difficulty.  No med changes.   Hyperglycemia - Plan: Hemoglobin A1c  - last visit. Check A1c.   Low serum vitamin D  - has not added supplement - deferred repeat test, can try otc vit D, as in AVS, recheck in 6 months.   Need for pneumococcal vaccination - Plan: Pneumococcal conjugate vaccine 13-valent IM  - prevnar given. Declined zostavax and tetanus at this time.   No orders of the defined types were placed in this encounter.   Patient Instructions       IF you received an x-[name] today, you will receive an invoice from [facility]. Please contact [facility] at [phone] with questions or concerns regarding your invoice.  IF you received labwork today, you will receive an invoice from [employer]. Please contact [facility] at [phone] with questions or concerns regarding your invoice.   Our billing staff will not be able to assist you with questions regarding bills from these companies.  You will be contacted with the lab results as soon as they are available. The fastest way to get your results is to activate your My Chart account. Instructions are located on the last page of this paperwork. If you have not heard from [place] regarding the results in 2 weeks, please contact this office.    Your vitamin D was low at last visit. You can take over the counter vitamin D supplement 800 individual units per day, and we can recheck this level next visit.   If you change your mind about the tetanus or shingles vaccine - let me know.   Have a good summer. Follow up in 6 months for physical     I personally performed the services described in this  documentation, which was scribed in my presence. The recorded information has been reviewed and considered, and addended by me as needed.   Signed,   David Ray, MD [facility].  11/11/2015 8:31 AM

## 2015-11-11 NOTE — Patient Instructions (Addendum)
     IF you received an x-ray today, you will receive an invoice from [facility]. Please contact [facility] at [phone] with questions or concerns regarding your invoice.   IF you received labwork today, you will receive an invoice from [employer]. Please contact [facility] at [phone] with questions or concerns regarding your invoice.   Our billing staff will not be able to assist you with questions regarding bills from these companies.  You will be contacted with the lab results as soon as they are available. The fastest way to get your results is to activate your My Chart account. Instructions are located on the last page of this paperwork. If you have not heard from [place] regarding the results in 2 weeks, please contact this office.    Your vitamin D was low at last visit. You can take over the counter vitamin D supplement 800 individual units per day, and we can recheck this level next visit.   If you change your mind about the tetanus or shingles vaccine - let me know.   Have a good summer. Follow up in 6 months for physical

## 2016-02-12 ENCOUNTER — Emergency Department (HOSPITAL_COMMUNITY)
Admission: EM | Admit: 2016-02-12 | Discharge: 2016-02-12 | Disposition: A | Discharge status: Home / Self Care | Payer: Medicare Other | Attending: Emergency Medicine | Admitting: Emergency Medicine

## 2016-02-12 ENCOUNTER — Telehealth: Payer: Self-pay | Admitting: Radiology

## 2016-02-12 ENCOUNTER — Encounter (HOSPITAL_COMMUNITY): Payer: Self-pay | Admitting: *Deleted

## 2016-02-12 DIAGNOSIS — Z87891 Personal history of nicotine dependence: Secondary | ICD-10-CM | POA: Insufficient documentation

## 2016-02-12 DIAGNOSIS — R112 Nausea with vomiting, unspecified: Secondary | ICD-10-CM | POA: Diagnosis not present

## 2016-02-12 DIAGNOSIS — I10 Essential (primary) hypertension: Secondary | ICD-10-CM | POA: Diagnosis not present

## 2016-02-12 DIAGNOSIS — K4091 Unilateral inguinal hernia, without obstruction or gangrene, recurrent: Secondary | ICD-10-CM | POA: Diagnosis not present

## 2016-02-12 DIAGNOSIS — R109 Unspecified abdominal pain: Secondary | ICD-10-CM | POA: Diagnosis present

## 2016-02-12 LAB — COMPREHENSIVE METABOLIC PANEL
ALT: 12 U/L — ABNORMAL LOW (ref 17–63)
ANION GAP: 9 (ref 5–15)
AST: 16 U/L (ref 15–41)
Albumin: 3.9 g/dL (ref 3.5–5.0)
Alkaline Phosphatase: 84 U/L (ref 38–126)
BUN: 20 mg/dL (ref 6–20)
CHLORIDE: 104 mmol/L (ref 101–111)
CO2: 28 mmol/L (ref 22–32)
Calcium: 9.5 mg/dL (ref 8.9–10.3)
Creatinine, Ser: 1.05 mg/dL (ref 0.61–1.24)
GFR calc non Af Amer: >60 mL/min (ref >60)
Glucose, Bld: 120 mg/dL — ABNORMAL HIGH (ref 65–99)
Potassium: 3.6 mmol/L (ref 3.5–5.1)
SODIUM: 141 mmol/L (ref 135–145)
Total Bilirubin: 0.8 mg/dL (ref 0.3–1.2)
Total Protein: 6.6 g/dL (ref 6.5–8.1)

## 2016-02-12 LAB — URINALYSIS, ROUTINE W REFLEX MICROSCOPIC
GLUCOSE, UA: NEGATIVE mg/dL
HGB URINE DIPSTICK: NEGATIVE
Ketones, ur: NEGATIVE mg/dL
LEUKOCYTES UA: NEGATIVE
Nitrite: NEGATIVE
Protein, ur: 30 mg/dL — AB
SPECIFIC GRAVITY, URINE: 1.024 (ref 1.005–1.030)
pH: 7 (ref 5.0–8.0)

## 2016-02-12 LAB — CBC
HCT: 45.7 % (ref 39.0–52.0)
HEMOGLOBIN: 14.2 g/dL (ref 13.0–17.0)
MCH: 28.9 pg (ref 26.0–34.0)
MCHC: 31.1 g/dL (ref 30.0–36.0)
MCV: 93.1 fL (ref 78.0–100.0)
Platelets: 472 10*3/uL — ABNORMAL HIGH (ref 150–400)
RBC: 4.91 MIL/uL (ref 4.22–5.81)
RDW: 14.2 % (ref 11.5–15.5)
WBC: 8.4 10*3/uL (ref 4.0–10.5)

## 2016-02-12 LAB — URINE MICROSCOPIC-ADD ON

## 2016-02-12 LAB — LIPASE, BLOOD: LIPASE: 34 U/L (ref 11–51)

## 2016-02-12 NOTE — ED Provider Notes (Signed)
[place] DEPT Provider Note   CSN: [id number] Arrival date & time: 02/12/16  1549     History   Chief Complaint Chief Complaint  Patient presents with  . Abdominal Pain    HPI [name] is a 82 y.o. male.  HPI   Patient is a 82 year old male with a history of PAD, HTN, BPH, right inguinal hernia who presents the emergency department today with pain in his right gluteal region since roughly 2 PM today. Patient [place] and again to experience a constant, nonradiating, 10/10 pain in his right inguinal region. Associated 2 episodes of emesis. Pain is now a 5/10. He states the emesis helped relieve his pain. Patient wears a hernia strap. Patient is followed by Dr. Nyoka Cowden at [facility] [place]. Patient denies hematemesis, fever, chills, diarrhea, dysuria or hematuria, testicular swelling, penile discharge.  Past Medical History:  Diagnosis Date  . BPH (benign prostatic hyperplasia)   . Cataract   . Exertional dyspnea    with exertion  . Hypertension   . Mitral valvular regurgitation June 2014   Mild to moderate MR on echocardiogram  . PAD (peripheral artery disease) ([place])  December 2014   Lower extremity Dopplers/ABIs: RABI 0.29, LABI 0.66; bilateral external iliacs with significant diameter reduction; R. SFA occlusive disease throughout into popliteal artery with 0 vessel runoff. Anterior tibial reconstitutes distally. 70-99% reduction in all SFA. One-vessel runoff (anterior tibial)    Patient Active Problem List   Diagnosis Date Noted  . Essential hypertension 05/08/2013  . Claudication of calf muscles ([place]) 05/06/2013  . PAD (peripheral artery disease) ([place]) 05/05/2013  . Family history of early CAD 04/14/2013  . DOE (dyspnea on exertion) 04/14/2013  . Abnormal EKG 04/14/2013  . Calculus of bile duct without mention of cholecystitis or obstruction 11/27/2012  . Abdominal mass, right upper quadrant 11/18/2012  .  Liver hemangioma 11/18/2012  . History of BPH 10/20/2012  . Valvular heart disease 10/17/2012    Past Surgical History:  Procedure Laterality Date  . ABDOMINAL ANGIOGRAM  07/07/2013   Procedure: ABDOMINAL ANGIOGRAM;  Surgeon: Lorretta Harp, MD;  Location: [facility] CATH LAB;  Service: Cardiovascular;;  . CHOLECYSTECTOMY  07/04/2010  . ERCP N/A 11/27/2012   Procedure: ENDOSCOPIC RETROGRADE CHOLANGIOPANCREATOGRAPHY (ERCP);  Surgeon: Inda Castle, MD;  Location: [facility];  Service: Endoscopy;  Laterality: N/A;  . EYE SURGERY Right   . LEFT HEART CATHETERIZATION WITH CORONARY ANGIOGRAM N/A 07/07/2013   Procedure: LEFT HEART CATHETERIZATION WITH CORONARY ANGIOGRAM;  Surgeon: Lorretta Harp, MD;  Location: [facility] CATH LAB;  Service: Cardiovascular;  Laterality: N/A;  . LOWER EXTREMITY ANGIOGRAM N/A 07/07/2013   Procedure: LOWER EXTREMITY ANGIOGRAM;  Surgeon: Lorretta Harp, MD;  Location: [facility] CATH LAB;  Service: Cardiovascular;  Laterality: N/A;  . NM MYOVIEW LTD  December 2014   Apical thinning but no ischemia. EF 55%  . PROSTATE SURGERY     Dr. Lowella Bandy  . TRANSTHORACIC ECHOCARDIOGRAM  June 2014   Basal septal thickening with moderate LVH. EF 65%. Normal wall motion. Diastolic dysfunction/NOS. Aortic sclerosis without stenosis. Mild to moderate MR       Home Medications    Prior to Admission medications   Medication Sig Start Date End Date Taking? Authorizing Provider  lisinopril-hydrochlorothiazide (PRINZIDE,ZESTORETIC) 10-12.5 MG tablet Take 1 tablet by mouth daily. 11/11/15  Yes [name], MD    Family History Family History  Problem Relation Age of Onset  .  Diabetes Sister   . Cancer Sister   . Cancer Mother   . Cancer Father   . Cancer Sister     Social History Social History  Substance Use Topics  . Smoking status: Former Smoker    Types: Cigarettes    Quit date: 11/25/1980  . Smokeless tobacco: Never Used  . Alcohol use 2.4 oz/week    4 Standard drinks or  equivalent per week     Comment: beers     Allergies   Review of patient's allergies indicates no known allergies.   Review of Systems Review of Systems  Constitutional: Negative for chills and fever.  Gastrointestinal: Positive for vomiting. Negative for abdominal distention, abdominal pain, blood in stool, constipation and diarrhea.  Genitourinary: Negative for discharge, dysuria, hematuria, scrotal swelling and testicular pain.       Right groin pain  Neurological: Negative for dizziness and syncope.     Physical Exam Updated Vital Signs BP 134/62   Pulse 72   Temp 98.3 F (36.8 C) (Oral)   Resp 22   Ht 5\' 5"  (1.651 m)   Wt 78.5 kg   SpO2 99%   BMI 28.79 kg/m   Physical Exam  Constitutional: He appears well-developed and well-nourished. No distress.  HENT:  Head: Normocephalic and atraumatic.  Eyes: Conjunctivae are normal.  Cardiovascular: Normal rate.  An irregular rhythm present.  Pulmonary/Chest: Effort normal. No respiratory distress.  Abdominal: Soft. Normal appearance. He exhibits no distension, no fluid wave and no mass. Bowel sounds are increased. There is no tenderness. There is no rigidity, no rebound and no guarding. A hernia is present. Hernia confirmed positive in the right inguinal area.  Genitourinary: Testes normal and penis normal. Right testis shows no mass, no swelling and no tenderness. Left testis shows no mass, no swelling and no tenderness. Uncircumcised.  Genitourinary Comments: Right inguinal abdominal defect, hernia reproducible, no TTP, no distention  Musculoskeletal: Normal range of motion.  Neurological: He is alert. Coordination normal.  Skin: Skin is warm and dry. He is not diaphoretic.  Psychiatric: He has a normal mood and affect. His behavior is normal.  Nursing note and vitals reviewed.    ED Treatments / Results  Labs (all labs ordered are listed, but only abnormal results are displayed) Labs Reviewed  COMPREHENSIVE  METABOLIC PANEL - Abnormal; Notable for the following:       Result Value   Glucose, Bld 120 (*)    ALT 12 (*)    All other components within normal limits  CBC - Abnormal; Notable for the following:    Platelets 472 (*)    All other components within normal limits  URINALYSIS, ROUTINE W REFLEX MICROSCOPIC (NOT AT [facility]) - Abnormal; Notable for the following:    Bilirubin Urine SMALL (*)    Protein, ur 30 (*)    All other components within normal limits  URINE MICROSCOPIC-ADD ON - Abnormal; Notable for the following:    Squamous Epithelial / LPF 0-5 (*)    Bacteria, UA RARE (*)    All other components within normal limits  LIPASE, BLOOD    EKG  EKG Interpretation None       Radiology No results found.  Procedures Procedures (including critical care time)  Medications Ordered in ED Medications - No data to display   Initial Impression / Assessment and Plan / ED Course  I have reviewed the triage vital signs and the nursing notes.  Pertinent labs & imaging results that were  available during my care of the patient were reviewed by me and considered in my medical decision making (see chart for details).  Clinical Course   Patient with right-sided inguinal hernia. Hernia was reducible on exam and pain was resolved with reduction, less concerning for incarceration or strangulation. Patient has a appointment with his PCP on Monday at 8 AM. I instructed the patient to keep that appointment with his PCP. I also instructed the patient to follow up with general surgery to discuss possible surgical options since patient has been seen here before for the same complaint, 07/2015. Instructed patient to get a new hernia strap. Will discharge home. Discussed strict return precautions. Patient expressed understanding to the discharge instructions.  Patient case discussed and patient seen by Dr. Jeanell Sparrow who agrees with the above plan.  Final Clinical Impressions(s) / ED Diagnoses   Final  diagnoses:  Unilateral recurrent inguinal hernia without obstruction or gangrene  Non-intractable vomiting with nausea, vomiting of unspecified type    New Prescriptions New Prescriptions   No medications on file     Kalman Drape, [place] 02/12/16 2112    Pattricia Boss, MD 02/15/16 [phone]

## 2016-02-12 NOTE — Telephone Encounter (Signed)
Patient came in to the office today states his hernia is painful, he has made an appt for Monday morning. I have advised him he should go to the Emergency Room if his pain is severe. Patient has voiced understanding and agrees to plan / To Dr Duwaine Maxin

## 2016-02-12 NOTE — ED Triage Notes (Signed)
The pt has an inquinal hernia rt lower abd for awhile yesterday and [place]  And the strap that holds his hernis  Inward does not seem to be working  He has burning there and he has sl nausea.  He also ate pork ribs at the festival last pm and he feels nauseated from that.

## 2016-02-12 NOTE — Discharge Instructions (Signed)
Go to your appointment with Dr. Carlota Raspberry on Monday at [place] to be reevaluated. Follow up with [facility], Dr. Kae Heller, to set up an appointment for possible surgery to repair your hernia.  Return to the emergency department if you experience groin pain, vomiting, blood in your vomit, abdominal pain, diarrhea, blood in your stool, swelling of your testicles, fever, or any other concerning symptoms.

## 2016-02-14 ENCOUNTER — Ambulatory Visit (INDEPENDENT_AMBULATORY_CARE_PROVIDER_SITE_OTHER): Payer: Medicare Other | Admitting: Family Medicine

## 2016-02-14 ENCOUNTER — Encounter: Payer: Self-pay | Admitting: Family Medicine

## 2016-02-14 VITALS — BP 104/50 | HR 53 | Temp 97.6°F | Resp 16 | Ht 65.0 in | Wt 170.0 lb

## 2016-02-14 DIAGNOSIS — K4090 Unilateral inguinal hernia, without obstruction or gangrene, not specified as recurrent: Secondary | ICD-10-CM | POA: Diagnosis not present

## 2016-02-14 NOTE — Progress Notes (Signed)
By signing my name below, I, Mesha Guinyard, attest that this documentation has been prepared under the direction and in the presence of [name], MD.  Electronically Signed: Verlee Monte, Medical Scribe. 02/14/16. 8:24 AM.  Subjective:    Patient ID: [name], male    DOB: [birth year], 82 y.o.   MRN: [id number]  HPI Chief Complaint  Patient presents with  . Hernia    Right side, was seen at ED, surgery suggested per pt.    HPI Comments: [name] is a 82 y.o. male who presents to the [facility] complaining of right right sided hernia onset 3 days ago. Has a hx of inguinal hernia and last seen in Feb for his hernia. At that time thought to be incarcerated but had reduced by the time he went to the ED. He was seen 2 days ago in the ER for right inguinal hernia pain, 2 episodes of emesis. Hernia was reducible. ER discharged him to follow-up with me and advised him a new hernia strap.   Pt felt a burning sensation when he was walking at [facility] yesterday, and felt a slight burning this morning while standing up. Pt reports nausea, and vomiting 3 days ago. Pt normally feels relief with his hernia belt, but hasn't felt relief from his symptoms for a while. Pt hasn't seen a surgeon for his symptoms. Pt denies fever, current nausea, current emesis, or irregular bowel movements.  Patient Active Problem List   Diagnosis Date Noted  . Essential hypertension 05/08/2013  . Claudication of calf muscles ([place]) 05/06/2013  . PAD (peripheral artery disease) ([place]) 05/05/2013  . Family history of early CAD 04/14/2013  . DOE (dyspnea on exertion) 04/14/2013  . Abnormal EKG 04/14/2013  . Calculus of bile duct without mention of cholecystitis or obstruction 11/27/2012  . Abdominal mass, right upper quadrant 11/18/2012  . Liver hemangioma 11/18/2012  . History of BPH 10/20/2012  . Valvular heart disease 10/17/2012   Past Medical History:  Diagnosis Date  . BPH  (benign prostatic hyperplasia)   . Cataract   . Exertional dyspnea    with exertion  . Hypertension   . Mitral valvular regurgitation June 2014   Mild to moderate MR on echocardiogram  . PAD (peripheral artery disease) ([place])  December 2014   Lower extremity Dopplers/ABIs: RABI 0.29, LABI 0.66; bilateral external iliacs with significant diameter reduction; R. SFA occlusive disease throughout into popliteal artery with 0 vessel runoff. Anterior tibial reconstitutes distally. 70-99% reduction in all SFA. One-vessel runoff (anterior tibial)   Past Surgical History:  Procedure Laterality Date  . ABDOMINAL ANGIOGRAM  07/07/2013   Procedure: ABDOMINAL ANGIOGRAM;  Surgeon: Lorretta Harp, MD;  Location: [facility] CATH LAB;  Service: Cardiovascular;;  . CHOLECYSTECTOMY  07/04/2010  . ERCP N/A 11/27/2012   Procedure: ENDOSCOPIC RETROGRADE CHOLANGIOPANCREATOGRAPHY (ERCP);  Surgeon: Inda Castle, MD;  Location: [facility];  Service: Endoscopy;  Laterality: N/A;  . EYE SURGERY Right   . LEFT HEART CATHETERIZATION WITH CORONARY ANGIOGRAM N/A 07/07/2013   Procedure: LEFT HEART CATHETERIZATION WITH CORONARY ANGIOGRAM;  Surgeon: Lorretta Harp, MD;  Location: [facility] CATH LAB;  Service: Cardiovascular;  Laterality: N/A;  . LOWER EXTREMITY ANGIOGRAM N/A 07/07/2013   Procedure: LOWER EXTREMITY ANGIOGRAM;  Surgeon: Lorretta Harp, MD;  Location: [facility] CATH LAB;  Service: Cardiovascular;  Laterality: N/A;  . NM MYOVIEW LTD  December 2014   Apical thinning but no ischemia. EF 55%  . PROSTATE SURGERY  Dr. Lowella Bandy  . TRANSTHORACIC ECHOCARDIOGRAM  June 2014   Basal septal thickening with moderate LVH. EF 65%. Normal wall motion. Diastolic dysfunction/NOS. Aortic sclerosis without stenosis. Mild to moderate MR   No Known Allergies Prior to Admission medications   Medication Sig Start Date End Date Taking? Authorizing Provider  lisinopril-hydrochlorothiazide (PRINZIDE,ZESTORETIC) 10-12.5 MG tablet Take 1 tablet by  mouth daily. 11/11/15  Yes [name], MD   Social History   Social History  . Marital status: Single    Spouse name: N/A  . Number of children: N/A  . Years of education: N/A   Occupational History  . Retired Retired   Social History Main Topics  . Smoking status: Former Smoker    Types: Cigarettes    Quit date: 11/25/1980  . Smokeless tobacco: Never Used  . Alcohol use 2.4 oz/week    4 Standard drinks or equivalent per week     Comment: beers  . Drug use: No  . Sexual activity: No   Other Topics Concern  . Not on file   Social History Narrative   He is originally from [place]. He has a history of long-standing tobacco use but quit several years ago. He also has a history of former alcohol abuse as well.   He is retired and currently single.   Depression screen [facility] 2/9 02/14/2016 11/11/2015 07/17/2015 05/17/2015 05/10/2015  Decreased Interest 0 0 0 0 0  Down, Depressed, Hopeless 0 0 0 0 0  PHQ - 2 Score 0 0 0 0 0   Review of Systems  Constitutional: Negative for fever.  Gastrointestinal: Negative for constipation, diarrhea, nausea and vomiting.   Objective:  Physical Exam  Constitutional: He appears well-developed and well-nourished. No distress.  HENT:  Head: Normocephalic and atraumatic.  Eyes: Conjunctivae are normal.  Neck: Neck supple.  Cardiovascular: Normal rate.   Pulmonary/Chest: Effort normal.  Abdominal: Soft. He exhibits no distension. There is no tenderness. A hernia is present. Hernia confirmed positive in the right inguinal area (easily reducible).  Genitourinary:  Genitourinary Comments: Testicles non tender  Neurological: He is alert.  Skin: Skin is warm and dry.  Psychiatric: He has a normal mood and affect. His behavior is normal.  Nursing note and vitals reviewed.  BP (!) 104/50   Pulse (!) 53   Temp 97.6 F (36.4 C) (Oral)   Resp 16   Ht 5\' 5"  (1.651 m)   Wt 170 lb (77.1 kg)   SpO2 99%   BMI 28.29 kg/m  Assessment & Plan:    [name] is a 82 y.o. male Right inguinal hernia - Plan: Ambulatory referral to General Surgery Symptomatic, recurrent episodes of possible incarceration. Reducible in the office today,ut will refer to general surgery for eval. ER/RTC precautions discussed and handout given  No orders of the defined types were placed in this encounter.  Patient Instructions    You still have a hernia. I will refer you to general surgeon for further evaluation and probable surgery. As we discussed in the past if you're unable to reduce the hernia, or nausea/vomiting/fevers or increasing abdominal pain return to the emergency room.   Hernia, Adult A hernia is the bulging of an organ or tissue through a weak spot in the muscles of the abdomen (abdominal wall). Hernias develop most often near the navel or groin. There are many kinds of hernias. Common kinds include:  Femoral hernia. This kind of hernia develops under the groin in the upper thigh  area.  Inguinal hernia. This kind of hernia develops in the groin or scrotum.  Umbilical hernia. This kind of hernia develops near the navel.  Hiatal hernia. This kind of hernia causes part of the stomach to be pushed up into the chest.  Incisional hernia. This kind of hernia bulges through a scar from an abdominal surgery. CAUSES This condition may be caused by:  Heavy lifting.  Coughing over a long period of time.  Straining to have a bowel movement.  An incision made during an abdominal surgery.  A birth defect (congenital defect).  Excess weight or obesity.  Smoking.  Poor nutrition.  Cystic fibrosis.  Excess fluid in the abdomen.  Undescended testicles. SYMPTOMS Symptoms of a hernia include:  A lump on the abdomen. This is the first sign of a hernia. The lump may become more obvious with standing, straining, or coughing. It may get bigger over time if it is not treated or if the condition causing it is not treated.  Pain. A  hernia is usually painless, but it may become painful over time if treatment is delayed. The pain is usually dull and may get worse with standing or lifting heavy objects. Sometimes a hernia gets tightly squeezed in the weak spot (strangulated) or stuck there (incarcerated) and causes additional symptoms. These symptoms may include:  Vomiting.  Nausea.  Constipation.  Irritability. DIAGNOSIS A hernia may be diagnosed with:  A physical exam. During the exam your health care provider may ask you to cough or to make a specific movement, because a hernia is usually more visible when you move.  Imaging tests. These can include:  X-rays.  Ultrasound.  CT scan. TREATMENT A hernia that is small and painless may not need to be treated. A hernia that is large or painful may be treated with surgery. Inguinal hernias may be treated with surgery to prevent incarceration or strangulation. Strangulated hernias are always treated with surgery, because lack of blood to the trapped organ or tissue can cause it to die. Surgery to treat a hernia involves pushing the bulge back into place and repairing the weak part of the abdomen. HOME CARE INSTRUCTIONS  Avoid straining.  Do not lift anything heavier than 10 lb (4.5 kg).  Lift with your leg muscles, not your back muscles. This helps avoid strain.  When coughing, try to cough gently.  Prevent constipation. Constipation leads to straining with bowel movements, which can make a hernia worse or cause a hernia repair to break down. You can prevent constipation by:  Eating a high-fiber diet that includes plenty of fruits and vegetables.  Drinking enough fluids to keep your urine clear or pale yellow. Aim to drink 6-8 glasses of water per day.  Using a stool softener as directed by your health care provider.  Lose weight, if you are overweight.  Do not use any tobacco products, including cigarettes, chewing tobacco, or electronic cigarettes. If you  need help quitting, ask your health care provider.  Keep all follow-up visits as directed by your health care provider. This is important. Your health care provider may need to monitor your condition. SEEK MEDICAL CARE IF:  You have swelling, redness, and pain in the affected area.  Your bowel habits change. SEEK IMMEDIATE MEDICAL CARE IF:  You have a fever.  You have abdominal pain that is getting worse.  You feel nauseous or you vomit.  You cannot push the hernia back in place by gently pressing on it while you are  lying down.  The hernia:  Changes in shape or size.  Is stuck outside the abdomen.  Becomes discolored.  Feels hard or tender.   This information is not intended to replace advice given to you by your health care provider. Make sure you discuss any questions you have with your health care provider.   Document Released: 05/22/2005 Document Revised: 06/12/2014 Document Reviewed: 04/01/2014 Elsevier Interactive Patient Education 2016 [employer].   IF you received an x-ray today, you will receive an invoice from [facility]. Please contact [facility] at [phone] with questions or concerns regarding your invoice.   IF you received labwork today, you will receive an invoice from [employer]. Please contact [facility] at [phone] with questions or concerns regarding your invoice.   Our billing staff will not be able to assist you with questions regarding bills from these companies.  You will be contacted with the lab results as soon as they are available. The fastest way to get your results is to activate your My Chart account. Instructions are located on the last page of this paperwork. If you have not heard from [place] regarding the results in 2 weeks, please contact this office.       I personally performed the services described in this documentation, which was scribed in my presence. The recorded information  has been reviewed and considered, and addended by me as needed.   Signed,   [name], MD [facility].  02/14/16 8:37 AM

## 2016-02-14 NOTE — Patient Instructions (Addendum)
You still have a hernia. I will refer you to general surgeon for further evaluation and probable surgery. As we discussed in the past if you're unable to reduce the hernia, or nausea/vomiting/fevers or increasing abdominal pain return to the emergency room.   Hernia, Adult A hernia is the bulging of an organ or tissue through a weak spot in the muscles of the abdomen (abdominal wall). Hernias develop most often near the navel or groin. There are many kinds of hernias. Common kinds include:  Femoral hernia. This kind of hernia develops under the groin in the upper thigh area.  Inguinal hernia. This kind of hernia develops in the groin or scrotum.  Umbilical hernia. This kind of hernia develops near the navel.  Hiatal hernia. This kind of hernia causes part of the stomach to be pushed up into the chest.  Incisional hernia. This kind of hernia bulges through a scar from an abdominal surgery. CAUSES This condition may be caused by:  Heavy lifting.  Coughing over a long period of time.  Straining to have a bowel movement.  An incision made during an abdominal surgery.  A birth defect (congenital defect).  Excess weight or obesity.  Smoking.  Poor nutrition.  Cystic fibrosis.  Excess fluid in the abdomen.  Undescended testicles. SYMPTOMS Symptoms of a hernia include:  A lump on the abdomen. This is the first sign of a hernia. The lump may become more obvious with standing, straining, or coughing. It may get bigger over time if it is not treated or if the condition causing it is not treated.  Pain. A hernia is usually painless, but it may become painful over time if treatment is delayed. The pain is usually dull and may get worse with standing or lifting heavy objects. Sometimes a hernia gets tightly squeezed in the weak spot (strangulated) or stuck there (incarcerated) and causes additional symptoms. These symptoms may  include:  Vomiting.  Nausea.  Constipation.  Irritability. DIAGNOSIS A hernia may be diagnosed with:  A physical exam. During the exam your health care provider may ask you to cough or to make a specific movement, because a hernia is usually more visible when you move.  Imaging tests. These can include:  X-rays.  Ultrasound.  CT scan. TREATMENT A hernia that is small and painless may not need to be treated. A hernia that is large or painful may be treated with surgery. Inguinal hernias may be treated with surgery to prevent incarceration or strangulation. Strangulated hernias are always treated with surgery, because lack of blood to the trapped organ or tissue can cause it to die. Surgery to treat a hernia involves pushing the bulge back into place and repairing the weak part of the abdomen. HOME CARE INSTRUCTIONS  Avoid straining.  Do not lift anything heavier than 10 lb (4.5 kg).  Lift with your leg muscles, not your back muscles. This helps avoid strain.  When coughing, try to cough gently.  Prevent constipation. Constipation leads to straining with bowel movements, which can make a hernia worse or cause a hernia repair to break down. You can prevent constipation by:  Eating a high-fiber diet that includes plenty of fruits and vegetables.  Drinking enough fluids to keep your urine clear or pale yellow. Aim to drink 6-8 glasses of water per day.  Using a stool softener as directed by your health care provider.  Lose weight, if you are overweight.  Do not use any tobacco products, including cigarettes, chewing tobacco, or  electronic cigarettes. If you need help quitting, ask your health care provider.  Keep all follow-up visits as directed by your health care provider. This is important. Your health care provider may need to monitor your condition. SEEK MEDICAL CARE IF:  You have swelling, redness, and pain in the affected area.  Your bowel habits change. SEEK  IMMEDIATE MEDICAL CARE IF:  You have a fever.  You have abdominal pain that is getting worse.  You feel nauseous or you vomit.  You cannot push the hernia back in place by gently pressing on it while you are lying down.  The hernia:  Changes in shape or size.  Is stuck outside the abdomen.  Becomes discolored.  Feels hard or tender.   This information is not intended to replace advice given to you by your health care provider. Make sure you discuss any questions you have with your health care provider.   Document Released: 05/22/2005 Document Revised: 06/12/2014 Document Reviewed: 04/01/2014 Elsevier Interactive Patient Education 2016 [employer].   IF you received an x-ray today, you will receive an invoice from [facility]. Please contact [facility] at [phone] with questions or concerns regarding your invoice.   IF you received labwork today, you will receive an invoice from [employer]. Please contact [facility] at [phone] with questions or concerns regarding your invoice.   Our billing staff will not be able to assist you with questions regarding bills from these companies.  You will be contacted with the lab results as soon as they are available. The fastest way to get your results is to activate your My Chart account. Instructions are located on the last page of this paperwork. If you have not heard from [place] regarding the results in 2 weeks, please contact this office.

## 2016-02-18 DIAGNOSIS — K4020 Bilateral inguinal hernia, without obstruction or gangrene, not specified as recurrent: Secondary | ICD-10-CM | POA: Diagnosis not present

## 2016-02-21 ENCOUNTER — Other Ambulatory Visit: Payer: Self-pay | Admitting: Surgery

## 2016-02-21 DIAGNOSIS — K4020 Bilateral inguinal hernia, without obstruction or gangrene, not specified as recurrent: Secondary | ICD-10-CM

## 2016-02-25 ENCOUNTER — Ambulatory Visit
Admission: RE | Admit: 2016-02-25 | Discharge: 2016-02-25 | Disposition: A | Discharge status: Home / Self Care | Payer: Medicare Other | Source: Ambulatory Visit | Attending: Surgery | Admitting: Surgery

## 2016-02-25 DIAGNOSIS — K4090 Unilateral inguinal hernia, without obstruction or gangrene, not specified as recurrent: Secondary | ICD-10-CM | POA: Diagnosis not present

## 2016-02-25 DIAGNOSIS — K4020 Bilateral inguinal hernia, without obstruction or gangrene, not specified as recurrent: Secondary | ICD-10-CM

## 2016-02-25 MED ORDER — IOPAMIDOL (ISOVUE-300) INJECTION 61%
100.0000 mL | Freq: Once | INTRAVENOUS | Status: AC | PRN
  Administered 2016-02-25: 100 mL via INTRAVENOUS

## 2016-03-17 DIAGNOSIS — K4020 Bilateral inguinal hernia, without obstruction or gangrene, not specified as recurrent: Secondary | ICD-10-CM | POA: Diagnosis not present

## 2016-05-11 ENCOUNTER — Ambulatory Visit (INDEPENDENT_AMBULATORY_CARE_PROVIDER_SITE_OTHER): Payer: Medicare Other | Admitting: Family Medicine

## 2016-05-11 ENCOUNTER — Encounter: Payer: Self-pay | Admitting: Family Medicine

## 2016-05-11 VITALS — BP 140/86 | HR 62 | Temp 97.7°F | Resp 16 | Ht 65.0 in | Wt 165.8 lb

## 2016-05-11 DIAGNOSIS — N400 Enlarged prostate without lower urinary tract symptoms: Secondary | ICD-10-CM

## 2016-05-11 DIAGNOSIS — R7303 Prediabetes: Secondary | ICD-10-CM

## 2016-05-11 DIAGNOSIS — K4090 Unilateral inguinal hernia, without obstruction or gangrene, not specified as recurrent: Secondary | ICD-10-CM

## 2016-05-11 DIAGNOSIS — I10 Essential (primary) hypertension: Secondary | ICD-10-CM

## 2016-05-11 DIAGNOSIS — Z0000 Encounter for general adult medical examination without abnormal findings: Secondary | ICD-10-CM | POA: Diagnosis not present
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
## 2016-05-11 NOTE — Patient Instructions (Addendum)
If you plan on having hernia surgery, I would want you to be seen by your cardiologist and have them clear you for surgery based on your heart murmur. I can complete paperwork as well form Dr. Hassell Done if needed.   If you decide not to have surgery, watch for any increased pain or "stuck" symptoms.  If you do experience those symptoms - go to ER and would then recommend surgery. See information below and let me know if you have questions.   Tylenol for knee pain if needed. If worsening pain or more trouble using it - return to discuss that further.   Return to the clinic or go to the nearest emergency room if any of your symptoms worsen or new symptoms occur.  Based on prior prostate enlargement, and enlarged prostate on cat scan of abdomen in September I will check your prostate test. Depending on results may need to have you return for exam or meet with urologist.     Follow up with eye doctor for routine exam and dentist evaluation also recommended.   Let me know if you have any questions on the advanced directives form. Once completed, bring a copy here so we can scan it into your record.   Return to the clinic or go to the nearest emergency room if any of your symptoms worsen or new symptoms occur.    Hernia, Adult A hernia is the bulging of an organ or tissue through a weak spot in the muscles of the abdomen (abdominal wall). Hernias develop most often near the navel or groin. There are many kinds of hernias. Common kinds include:  Femoral hernia. This kind of hernia develops under the groin in the upper thigh area.  Inguinal hernia. This kind of hernia develops in the groin or scrotum.  Umbilical hernia. This kind of hernia develops near the navel.  Hiatal hernia. This kind of hernia causes part of the stomach to be pushed up into the chest.  Incisional hernia. This kind of hernia bulges through a scar from an abdominal surgery. What are the causes? This condition may be caused  by:  Heavy lifting.  Coughing over a long period of time.  Straining to have a bowel movement.  An incision made during an abdominal surgery.  A birth defect (congenital defect).  Excess weight or obesity.  Smoking.  Poor nutrition.  Cystic fibrosis.  Excess fluid in the abdomen.  Undescended testicles. What are the signs or symptoms? Symptoms of a hernia include:  A lump on the abdomen. This is the first sign of a hernia. The lump may become more obvious with standing, straining, or coughing. It may get bigger over time if it is not treated or if the condition causing it is not treated.  Pain. A hernia is usually painless, but it may become painful over time if treatment is delayed. The pain is usually dull and may get worse with standing or lifting heavy objects. Sometimes a hernia gets tightly squeezed in the weak spot (strangulated) or stuck there (incarcerated) and causes additional symptoms. These symptoms may include:  Vomiting.  Nausea.  Constipation.  Irritability. How is this diagnosed? A hernia may be diagnosed with:  A physical exam. During the exam your health care provider may ask you to cough or to make a specific movement, because a hernia is usually more visible when you move.  Imaging tests. These can include:  X-rays.  Ultrasound.  CT scan. How is this treated? A hernia  that is small and painless may not need to be treated. A hernia that is large or painful may be treated with surgery. Inguinal hernias may be treated with surgery to prevent incarceration or strangulation. Strangulated hernias are always treated with surgery, because lack of blood to the trapped organ or tissue can cause it to die. Surgery to treat a hernia involves pushing the bulge back into place and repairing the weak part of the abdomen. Follow these instructions at home:  Avoid straining.  Do not lift anything heavier than 10 lb (4.5 kg).  Lift with your leg muscles,  not your back muscles. This helps avoid strain.  When coughing, try to cough gently.  Prevent constipation. Constipation leads to straining with bowel movements, which can make a hernia worse or cause a hernia repair to break down. You can prevent constipation by:  Eating a high-fiber diet that includes plenty of fruits and vegetables.  Drinking enough fluids to keep your urine clear or pale yellow. Aim to drink 6-8 glasses of water per day.  Using a stool softener as directed by your health care provider.  Lose weight, if you are overweight.  Do not use any tobacco products, including cigarettes, chewing tobacco, or electronic cigarettes. If you need help quitting, ask your health care provider.  Keep all follow-up visits as directed by your health care provider. This is important. Your health care provider may need to monitor your condition. Contact a health care provider if:  You have swelling, redness, and pain in the affected area.  Your bowel habits change. Get help right away if:  You have a fever.  You have abdominal pain that is getting worse.  You feel nauseous or you vomit.  You cannot push the hernia back in place by gently pressing on it while you are lying down.  The hernia:  Changes in shape or size.  Is stuck outside the abdomen.  Becomes discolored.  Feels hard or tender. This information is not intended to replace advice given to you by your health care provider. Make sure you discuss any questions you have with your health care provider. Document Released: 05/22/2005 Document Revised: 10/20/2015 Document Reviewed: 04/01/2014 Elsevier Interactive Patient Education  2017 [place] you healthy  Get these tests  Blood pressure- Have your blood pressure checked once a year by your healthcare provider.  Normal blood pressure is 120/80  Weight- Have your body mass index (BMI) calculated to screen for obesity.  BMI is a measure of body fat  based on height and weight. You can also calculate your own BMI at ViewBanking.si.  Cholesterol- Have your cholesterol checked every year.  Diabetes- Have your blood sugar checked regularly if you have high blood pressure, high cholesterol, have a family history of diabetes or if you are overweight.  Screening for Colon Cancer- Colonoscopy starting at age 47.  Screening may begin sooner depending on your family history and other health conditions. Follow up colonoscopy as directed by your Gastroenterologist.  Screening for Prostate Cancer- Both blood work (PSA) and a rectal exam help screen for Prostate Cancer.  Screening begins at age 68 with African-American men and at age 80 with Caucasian men.  Screening may begin sooner depending on your family history.  Take these medicines  Aspirin- One aspirin daily can help prevent Heart disease and Stroke.  Flu shot- Every fall.  Tetanus- Every 10 years.  Zostavax- Once after the age of 38 to prevent Shingles.  Pneumonia shot-  Once after the age of 44; if you are younger than 30, ask your healthcare provider if you need a Pneumonia shot.  Take these steps  Don't smoke- If you do smoke, talk to your doctor about quitting.  For tips on how to quit, go to www.smokefree.gov or call 1-800-QUIT-NOW.  Be physically active- Exercise 5 days a week for at least 30 minutes.  If you are not already physically active start slow and gradually work up to 30 minutes of moderate physical activity.  Examples of moderate activity include walking briskly, mowing the yard, dancing, swimming, bicycling, etc.  Eat a healthy diet- Eat a variety of healthy food such as fruits, vegetables, low fat milk, low fat cheese, yogurt, lean meant, poultry, fish, beans, tofu, etc. For more information go to www.thenutritionsource.org  Drink alcohol in moderation- Limit alcohol intake to less than two drinks a day. Never drink and drive.  Dentist- Brush and floss  twice daily; visit your dentist twice a year.  Depression- Your emotional health is as important as your physical health. If you're feeling down, or losing interest in things you would normally enjoy please talk to your healthcare provider.  Eye exam- Visit your eye doctor every year.  Safe sex- If you may be exposed to a sexually transmitted infection, use a condom.  Seat belts- Seat belts can save your life; always wear one.  Smoke/Carbon Monoxide detectors- These detectors need to be installed on the appropriate level of your home.  Replace batteries at least once a year.  Skin cancer- When out in the sun, cover up and use sunscreen 15 SPF or higher.  Violence- If anyone is threatening you, please tell your healthcare provider.  Living Will/ Health care power of attorney- Speak with your healthcare provider and family.

## 2016-05-12 LAB — COMPREHENSIVE METABOLIC PANEL
A/G RATIO: 1.4 (ref 1.2–2.2)
ALBUMIN: 3.7 g/dL (ref 3.5–4.7)
ALT: 5 IU/L (ref 0–44)
AST: 11 IU/L (ref 0–40)
Alkaline Phosphatase: 129 IU/L — ABNORMAL HIGH (ref 39–117)
BILIRUBIN TOTAL: 0.3 mg/dL (ref 0.0–1.2)
BUN / CREAT RATIO: 20 (ref 10–24)
BUN: 17 mg/dL (ref 8–27)
CHLORIDE: 101 mmol/L (ref 96–106)
CO2: 28 mmol/L (ref 18–29)
Calcium: 9.2 mg/dL (ref 8.6–10.2)
Creatinine, Ser: 0.87 mg/dL (ref 0.76–1.27)
GFR calc non Af Amer: 79 mL/min/{1.73_m2} (ref >59)
GFR, EST AFRICAN AMERICAN: 92 mL/min/{1.73_m2} (ref >59)
GLOBULIN, TOTAL: 2.7 g/dL (ref 1.5–4.5)
GLUCOSE: 77 mg/dL (ref 65–99)
Potassium: 4.2 mmol/L (ref 3.5–5.2)
SODIUM: 142 mmol/L (ref 134–144)
TOTAL PROTEIN: 6.4 g/dL (ref 6.0–8.5)

## 2016-05-12 LAB — PSA: Prostate Specific Ag, Serum: 5.6 ng/mL — ABNORMAL HIGH (ref 0.0–4.0)

## 2016-05-12 LAB — HEMOGLOBIN A1C
Est. average glucose Bld gHb Est-mCnc: 108 mg/dL
Hgb A1c MFr Bld: 5.4 % (ref 4.8–5.6)

## 2016-05-26 ENCOUNTER — Encounter: Payer: Self-pay | Admitting: *Deleted

## 2016-06-04 ENCOUNTER — Other Ambulatory Visit: Payer: Self-pay | Admitting: Family Medicine

## 2016-06-04 DIAGNOSIS — I10 Essential (primary) hypertension: Secondary | ICD-10-CM

## 2016-10-10 ENCOUNTER — Encounter (HOSPITAL_COMMUNITY): Payer: Self-pay | Admitting: Emergency Medicine

## 2016-10-10 ENCOUNTER — Ambulatory Visit (INDEPENDENT_AMBULATORY_CARE_PROVIDER_SITE_OTHER): Payer: Medicare Other | Admitting: Family Medicine

## 2016-10-10 ENCOUNTER — Encounter: Payer: Self-pay | Admitting: Family Medicine

## 2016-10-10 ENCOUNTER — Emergency Department (HOSPITAL_COMMUNITY)
Admission: EM | Admit: 2016-10-10 | Discharge: 2016-10-11 | Disposition: A | Discharge status: Home / Self Care | Payer: Medicare Other | Attending: Emergency Medicine | Admitting: Emergency Medicine

## 2016-10-10 VITALS — BP 137/72 | HR 85 | Temp 98.2°F | Resp 18 | Ht 65.0 in | Wt 157.6 lb

## 2016-10-10 DIAGNOSIS — R1031 Right lower quadrant pain: Secondary | ICD-10-CM

## 2016-10-10 DIAGNOSIS — Z79899 Other long term (current) drug therapy: Secondary | ICD-10-CM | POA: Diagnosis not present

## 2016-10-10 DIAGNOSIS — Z87891 Personal history of nicotine dependence: Secondary | ICD-10-CM | POA: Diagnosis not present

## 2016-10-10 DIAGNOSIS — K4090 Unilateral inguinal hernia, without obstruction or gangrene, not specified as recurrent: Secondary | ICD-10-CM | POA: Diagnosis not present

## 2016-10-10 DIAGNOSIS — G43A0 Cyclical vomiting, not intractable: Secondary | ICD-10-CM

## 2016-10-10 DIAGNOSIS — I10 Essential (primary) hypertension: Secondary | ICD-10-CM | POA: Insufficient documentation

## 2016-10-10 LAB — COMPREHENSIVE METABOLIC PANEL
ALBUMIN: 4 g/dL (ref 3.5–5.0)
ALT: 10 U/L — ABNORMAL LOW (ref 17–63)
ANION GAP: 11 (ref 5–15)
AST: 16 U/L (ref 15–41)
Alkaline Phosphatase: 97 U/L (ref 38–126)
BILIRUBIN TOTAL: 0.9 mg/dL (ref 0.3–1.2)
BUN: 11 mg/dL (ref 6–20)
CHLORIDE: 101 mmol/L (ref 101–111)
CO2: 27 mmol/L (ref 22–32)
Calcium: 9.6 mg/dL (ref 8.9–10.3)
Creatinine, Ser: 0.91 mg/dL (ref 0.61–1.24)
GFR calc Af Amer: >60 mL/min (ref >60)
GFR calc non Af Amer: >60 mL/min (ref >60)
GLUCOSE: 112 mg/dL — AB (ref 65–99)
POTASSIUM: 3.5 mmol/L (ref 3.5–5.1)
SODIUM: 139 mmol/L (ref 135–145)
TOTAL PROTEIN: 6.9 g/dL (ref 6.5–8.1)

## 2016-10-10 LAB — CBC WITH DIFFERENTIAL/PLATELET
Basophils Absolute: 0 10*3/uL (ref 0.0–0.1)
Basophils Relative: 0 %
EOS ABS: 0 10*3/uL (ref 0.0–0.7)
EOS PCT: 0 %
HCT: 44.3 % (ref 39.0–52.0)
Hemoglobin: 14.2 g/dL (ref 13.0–17.0)
Lymphocytes Relative: 21 %
Lymphs Abs: 1.7 10*3/uL (ref 0.7–4.0)
MCH: 28.6 pg (ref 26.0–34.0)
MCHC: 32.1 g/dL (ref 30.0–36.0)
MCV: 89.1 fL (ref 78.0–100.0)
MONO ABS: 0.6 10*3/uL (ref 0.1–1.0)
MONOS PCT: 7 %
Neutro Abs: 5.8 10*3/uL (ref 1.7–7.7)
Neutrophils Relative %: 72 %
PLATELETS: 489 10*3/uL — AB (ref 150–400)
RBC: 4.97 MIL/uL (ref 4.22–5.81)
RDW: 14.5 % (ref 11.5–15.5)
WBC: 8.2 10*3/uL (ref 4.0–10.5)

## 2016-10-10 LAB — URINALYSIS, ROUTINE W REFLEX MICROSCOPIC
BILIRUBIN URINE: NEGATIVE
Glucose, UA: NEGATIVE mg/dL
HGB URINE DIPSTICK: NEGATIVE
Ketones, ur: 20 mg/dL — AB
Nitrite: NEGATIVE
PH: 6 (ref 5.0–8.0)
Protein, ur: 100 mg/dL — AB
Specific Gravity, Urine: 1.025 (ref 1.005–1.030)

## 2016-10-10 NOTE — ED Notes (Signed)
Dr. Canary Brim notified on pt.'s condition , advised to monitor pt. at this time .

## 2016-10-10 NOTE — ED Provider Notes (Signed)
[facility] DEPT Provider Note   CSN: [id number] Arrival date & time: 10/10/16  1847     History   Chief Complaint Chief Complaint  Patient presents with  . Inguinal Hernia    HPI [name] is a 83 y.o. male.  Patient reports having an inguinal hernia on the right side for approximately 1 year. Typically has no issues with it. Is being followed outpatient with a general surgeon. Over the last 1 day, the patient developed increased pain at his hernia site, and was having a hard time reducing it. Today the patient had some nausea and 3 episodes of emesis. Did not have a bowel movement, but was passing gas without issue. Went to his primary care doctor. They were unable to reduce the hernia, so sent him here.   The history is provided by the patient.  Illness  This is a recurrent problem. Episode onset: approx 1 year of hernia with increased pain over 1 day. The problem occurs constantly. The problem has not changed since onset.Associated symptoms include abdominal pain. Pertinent negatives include no chest pain and no shortness of breath. Nothing aggravates the symptoms. The symptoms are relieved by ice.    Past Medical History:  Diagnosis Date  . BPH (benign prostatic hyperplasia)   . Cataract   . Exertional dyspnea    with exertion  . Hypertension   . Mitral valvular regurgitation June 2014   Mild to moderate MR on echocardiogram  . PAD (peripheral artery disease) ([place])  December 2014   Lower extremity Dopplers/ABIs: RABI 0.29, LABI 0.66; bilateral external iliacs with significant diameter reduction; R. SFA occlusive disease throughout into popliteal artery with 0 vessel runoff. Anterior tibial reconstitutes distally. 70-99% reduction in all SFA. One-vessel runoff (anterior tibial)    Patient Active Problem List   Diagnosis Date Noted  . Essential hypertension 05/08/2013  . Claudication of calf muscles ([place]) 05/06/2013  . PAD (peripheral artery disease) ([place])  05/05/2013  . Family history of early CAD 04/14/2013  . DOE (dyspnea on exertion) 04/14/2013  . Abnormal EKG 04/14/2013  . Calculus of bile duct without mention of cholecystitis or obstruction 11/27/2012  . Abdominal mass, right upper quadrant 11/18/2012  . Liver hemangioma 11/18/2012  . History of BPH 10/20/2012  . Valvular heart disease 10/17/2012    Past Surgical History:  Procedure Laterality Date  . ABDOMINAL ANGIOGRAM  07/07/2013   Procedure: ABDOMINAL ANGIOGRAM;  Surgeon: Lorretta Harp, MD;  [facility] C[facility] LAB;  Service: Cardiovascular;;  . CHOLECYSTECTOMY  07/04/2010  . ERCP N/A 11/27/2012   Procedure: ENDOSCOPIC RETROGRADE CHOLANGIOPANCREATOGRAPHY (ERCP);  Surgeon: Inda Castle, MD;  Location: [facility] ENDOSCOPY;  Service: Endoscopy;  Laterality: N/A;  . EYE SURGERY Right   . LEFT HEART CATHETERIZATION WITH CORONARY ANGIOGRAM N/A 07/07/2013   Procedure: LEFT HEART CATHETERIZATION WITH CORONARY ANGIOGRAM;  Surgeon: Lorretta Harp, MD;  Location: [facility] CATH LAB;  Service: Cardiovascular;  Laterality: N/A;  . LOWER EXTREMITY ANGIOGRAM N/A 07/07/2013   Procedure: LOWER EXTREMITY ANGIOGRAM;  Surgeon: Lorretta Harp, MD;  Location: [facility] CATH LAB;  Service: Cardiovascular;  Laterality: N/A;  . NM MYOVIEW LTD  December 2014   Apical thinning but no ischemia. EF 55%  . PROSTATE SURGERY     Dr. Lowella Bandy  . TRANSTHORACIC ECHOCARDIOGRAM  June 2014   Basal septal thickening with moderate LVH. EF 65%. Normal wall motion. Diastolic dysfunction/NOS. Aortic sclerosis without stenosis. Mild to moderate MR       Home Medications  Prior to Admission medications   Medication Sig Start Date End Date Taking? Authorizing Provider  lisinopril-hydrochlorothiazide (PRINZIDE,ZESTORETIC) 10-12.5 MG tablet TAKE 1 TABLET BY MOUTH DAILY 06/05/16  Yes [name], MD    Family History Family History  Problem Relation Age of Onset  . Diabetes Sister   . Cancer Sister   . Cancer Mother   .  Cancer Father   . Cancer Sister     Social History Social History  Substance Use Topics  . Smoking status: Former Smoker    Types: Cigarettes    Quit date: 11/25/1980  . Smokeless tobacco: Never Used  . Alcohol use 2.4 oz/week    4 Standard drinks or equivalent per week     Comment: beers     Allergies   Patient has no known allergies.   Review of Systems Review of Systems  Constitutional: Negative for chills and fever.  Respiratory: Negative for shortness of breath.   Cardiovascular: Negative for chest pain and palpitations.  Gastrointestinal: Positive for abdominal distention, abdominal pain, nausea and vomiting. Negative for blood in stool and diarrhea.  Genitourinary: Negative for discharge, dysuria and frequency.  All other systems reviewed and are negative.    Physical Exam Updated Vital Signs BP (!) 159/89   Pulse 75   Temp 98 F (36.7 C) (Oral)   Resp 13   Ht 5\' 5"  (1.651 m)   Wt 71.2 kg   SpO2 98%   BMI 26.13 kg/m   Physical Exam  Constitutional: He appears well-developed and well-nourished.  HENT:  Head: Normocephalic and atraumatic.  Eyes: Conjunctivae are normal.  Neck: Neck supple.  Cardiovascular: Normal rate and regular rhythm.   No murmur heard. Pulmonary/Chest: Effort normal and breath sounds normal. No respiratory distress.  Abdominal: Soft. There is no tenderness. A hernia is present. Hernia confirmed positive in the right inguinal area (soft).  Musculoskeletal: He exhibits no edema.  Neurological: He is alert.  Skin: Skin is warm and dry.  Psychiatric: He has a normal mood and affect.  Nursing note and vitals reviewed.    ED Treatments / Results  Labs (all labs ordered are listed, but only abnormal results are displayed) Labs Reviewed  CBC WITH DIFFERENTIAL/PLATELET - Abnormal; Notable for the following:       Result Value   Platelets 489 (*)    All other components within normal limits  COMPREHENSIVE METABOLIC PANEL -  Abnormal; Notable for the following:    Glucose, Bld 112 (*)    ALT 10 (*)    All other components within normal limits  URINALYSIS, ROUTINE W REFLEX MICROSCOPIC - Abnormal; Notable for the following:    Color, Urine AMBER (*)    APPearance CLOUDY (*)    Ketones, ur 20 (*)    Protein, ur 100 (*)    Leukocytes, UA MODERATE (*)    Bacteria, UA MANY (*)    Squamous Epithelial / LPF 0-5 (*)    All other components within normal limits    EKG  EKG Interpretation None       Radiology No results found.  Procedures Hernia reduction Date/Time: 10/10/2016 11:43 PM Performed by: Maryan Puls Authorized by: Dorie Rank  Consent: Verbal consent obtained. Consent given by: patient Patient understanding: patient states understanding of the procedure being performed Patient tolerance: Patient tolerated the procedure well with no immediate complications    (including critical care time)  Medications Ordered in ED Medications - No data to display   Initial Impression /  Assessment and Plan / ED Course  I have reviewed the triage vital signs and the nursing notes.  Pertinent labs & imaging results that were available during my care of the patient were reviewed by me and considered in my medical decision making (see chart for details).  Clinical Course as of Oct 11 2339  Tue Oct 10, 2016  2213 Potassium: 3.5 [SO]    Clinical Course User Index [SO] Maryan Puls, MD    Patient reports earlier today he initially did have some nausea, vomiting, but has been passing gas without issue. Sent here by his primary care doctor after failure to reduce his hernia. On my exam, patient does have a right inguinal hernia. Soft. Reduced as above. Patient now reporting complete resolution of pain. His abdomen is soft, nondistended. Given that he is still passing gas, and we have been able to reduce his hernia, doubt bowel obstruction. His labs here are reassuring. Low suspicion for incarceration or  strangulation at this point. Urinalysis was questionable for urinary tract infection him up but did not have nitrites, and the patient did not have any urinary symptoms. Will not treat him for now. Told him to follow up with his primary care doctor should he have any urinary symptoms. Told him to follow up with his general surgeon to pursue possible elective surgery for his hernia.  Final Clinical Impressions(s) / ED Diagnoses   Final diagnoses:  Right inguinal hernia    New Prescriptions New Prescriptions   No medications on file     Maryan Puls, MD 10/10/16 2346

## 2016-10-10 NOTE — Patient Instructions (Addendum)
Go to [facility] ER after leaving our office as I am concerned about that hernia becoming stuck and intestine obstruction. I advised the staff you were on the way.   Hernia, Adult A hernia is the bulging of an organ or tissue through a weak spot in the muscles of the abdomen (abdominal wall). Hernias develop most often near the navel or groin. There are many kinds of hernias. Common kinds include:  Femoral hernia. This kind of hernia develops under the groin in the upper thigh area.  Inguinal hernia. This kind of hernia develops in the groin or scrotum.  Umbilical hernia. This kind of hernia develops near the navel.  Hiatal hernia. This kind of hernia causes part of the stomach to be pushed up into the chest.  Incisional hernia. This kind of hernia bulges through a scar from an abdominal surgery. What are the causes? This condition may be caused by:  Heavy lifting.  Coughing over a long period of time.  Straining to have a bowel movement.  An incision made during an abdominal surgery.  A birth defect (congenital defect).  Excess weight or obesity.  Smoking.  Poor nutrition.  Cystic fibrosis.  Excess fluid in the abdomen.  Undescended testicles. What are the signs or symptoms? Symptoms of a hernia include:  A lump on the abdomen. This is the first sign of a hernia. The lump may become more obvious with standing, straining, or coughing. It may get bigger over time if it is not treated or if the condition causing it is not treated.  Pain. A hernia is usually painless, but it may become painful over time if treatment is delayed. The pain is usually dull and may get worse with standing or lifting heavy objects. Sometimes a hernia gets tightly squeezed in the weak spot (strangulated) or stuck there (incarcerated) and causes additional symptoms. These symptoms may include:  Vomiting.  Nausea.  Constipation.  Irritability. How is this diagnosed? A hernia may be  diagnosed with:  A physical exam. During the exam your health care provider may ask you to cough or to make a specific movement, because a hernia is usually more visible when you move.  Imaging tests. These can include:  X-rays.  Ultrasound.  CT scan. How is this treated? A hernia that is small and painless may not need to be treated. A hernia that is large or painful may be treated with surgery. Inguinal hernias may be treated with surgery to prevent incarceration or strangulation. Strangulated hernias are always treated with surgery, because lack of blood to the trapped organ or tissue can cause it to die. Surgery to treat a hernia involves pushing the bulge back into place and repairing the weak part of the abdomen. Follow these instructions at home:  Avoid straining.  Do not lift anything heavier than 10 lb (4.5 kg).  Lift with your leg muscles, not your back muscles. This helps avoid strain.  When coughing, try to cough gently.  Prevent constipation. Constipation leads to straining with bowel movements, which can make a hernia worse or cause a hernia repair to break down. You can prevent constipation by:  Eating a high-fiber diet that includes plenty of fruits and vegetables.  Drinking enough fluids to keep your urine clear or pale yellow. Aim to drink 6-8 glasses of water per day.  Using a stool softener as directed by your health care provider.  Lose weight, if you are overweight.  Do not use any tobacco products, including  cigarettes, chewing tobacco, or electronic cigarettes. If you need help quitting, ask your health care provider.  Keep all follow-up visits as directed by your health care provider. This is important. Your health care provider may need to monitor your condition. Contact a health care provider if:  You have swelling, redness, and pain in the affected area.  Your bowel habits change. Get help right away if:  You have a fever.  You have abdominal  pain that is getting worse.  You feel nauseous or you vomit.  You cannot push the hernia back in place by gently pressing on it while you are lying down.  The hernia:  Changes in shape or size.  Is stuck outside the abdomen.  Becomes discolored.  Feels hard or tender. This information is not intended to replace advice given to you by your health care provider. Make sure you discuss any questions you have with your health care provider. Document Released: 05/22/2005 Document Revised: 10/20/2015 Document Reviewed: 04/01/2014 Elsevier Interactive Patient Education  2017 [employer].   IF you received an x-ray today, you will receive an invoice from [facility]. Please contact [facility] at [phone] with questions or concerns regarding your invoice.   IF you received labwork today, you will receive an invoice from [facility]. Please contact [facility] at [phone] with questions or concerns regarding your invoice.   Our billing staff will not be able to assist you with questions regarding bills from these companies.  You will be contacted with the lab results as soon as they are available. The fastest way to get your results is to activate your My Chart account. Instructions are located on the last page of this paperwork. If you have not heard from [place] regarding the results in 2 weeks, please contact this office.

## 2016-10-10 NOTE — ED Triage Notes (Signed)
Patient reports right inguinal hernia with intermittent pain for several tears worse this week , denies fever or chills / no dysuria.

## 2016-10-10 NOTE — Progress Notes (Signed)
Subjective:  By signing my name below, I, [name], attest that this documentation has been prepared under the direction and in the presence of David Agreste, MD Electronically Signed: Ladene Artist, ED Scribe 10/10/2016 at 5:42 PM.   Patient ID: [name], male    DOB: 1929-06-20, 83 y.o.   MRN: [id number]  Chief Complaint  Patient presents with  . possible hernia    patient states he has pain in his right side   . Emesis    started today; has vomited at least 3-4 times;  feels like a pull in his stomach   HPI [name] is a 83 y.o. male who presents to [place] at [facility]. H/o inguinal hernia. See prior visits. Sent to ER last year for possible incarcerated hernia that had reduced in the ER. Treated with a hernia strap.   Today, pt reports RLQ abdominal pain that has been exacerbated with prolonged standing for the past month. He states that abdominal pain is improved with lying with the exception of last night when he could not get comfortable. Pt reports 3 episodes of emesis today with his last episode being approximately 20 minutes PTA that improved abdominal pain. He states that symptoms feel similar to when he was sent to the ER last year for hernia. Pt denies diarrhea.   Patient Active Problem List   Diagnosis Date Noted  . Essential hypertension 05/08/2013  . Claudication of calf muscles ([place]) 05/06/2013  . PAD (peripheral artery disease) ([place]) 05/05/2013  . Family history of early CAD 04/14/2013  . DOE (dyspnea on exertion) 04/14/2013  . Abnormal EKG 04/14/2013  . Calculus of bile duct without mention of cholecystitis or obstruction 11/27/2012  . Abdominal mass, right upper quadrant 11/18/2012  . Liver hemangioma 11/18/2012  . History of BPH 10/20/2012  . Valvular heart disease 10/17/2012   Past Medical History:  Diagnosis Date  . BPH (benign prostatic hyperplasia)   . Cataract   . Exertional dyspnea    with exertion  . Hypertension   . Mitral  valvular regurgitation June 2014   Mild to moderate MR on echocardiogram  . PAD (peripheral artery disease) ([place])  December 2014   Lower extremity Dopplers/ABIs: RABI 0.29, LABI 0.66; bilateral external iliacs with significant diameter reduction; R. SFA occlusive disease throughout into popliteal artery with 0 vessel runoff. Anterior tibial reconstitutes distally. 70-99% reduction in all SFA. One-vessel runoff (anterior tibial)   Past Surgical History:  Procedure Laterality Date  . ABDOMINAL ANGIOGRAM  07/07/2013   Procedure: ABDOMINAL ANGIOGRAM;  Surgeon: Lorretta Harp, MD;  Location: [facility] CATH LAB;  Service: Cardiovascular;;  . CHOLECYSTECTOMY  07/04/2010  . ERCP N/A 11/27/2012   Procedure: ENDOSCOPIC RETROGRADE CHOLANGIOPANCREATOGRAPHY (ERCP);  Surgeon: Inda Castle, MD;  Location: [facility];  Service: Endoscopy;  Laterality: N/A;  . EYE SURGERY Right   . LEFT HEART CATHETERIZATION WITH CORONARY ANGIOGRAM N/A 07/07/2013   Procedure: LEFT HEART CATHETERIZATION WITH CORONARY ANGIOGRAM;  Surgeon: Lorretta Harp, MD;  Location: [facility] CATH LAB;  Service: Cardiovascular;  Laterality: N/A;  . LOWER EXTREMITY ANGIOGRAM N/A 07/07/2013   Procedure: LOWER EXTREMITY ANGIOGRAM;  Surgeon: Lorretta Harp, MD;  Location: [facility] CATH LAB;  Service: Cardiovascular;  Laterality: N/A;  . NM MYOVIEW LTD  December 2014   Apical thinning but no ischemia. EF 55%  . PROSTATE SURGERY     Dr. Lowella Bandy  . TRANSTHORACIC ECHOCARDIOGRAM  June 2014   Basal septal thickening with moderate  LVH. EF 65%. Normal wall motion. Diastolic dysfunction/NOS. Aortic sclerosis without stenosis. Mild to moderate MR   No Known Allergies Prior to Admission medications   Medication Sig Start Date End Date Taking? Authorizing Provider  lisinopril-hydrochlorothiazide (PRINZIDE,ZESTORETIC) 10-12.5 MG tablet TAKE 1 TABLET BY MOUTH DAILY 06/05/16  Yes David Agreste, MD   Social History   Social History  . Marital status: Single     Spouse name: N/A  . Number of children: N/A  . Years of education: N/A   Occupational History  . Retired Retired   Social History Main Topics  . Smoking status: Former Smoker    Types: Cigarettes    Quit date: 11/25/1980  . Smokeless tobacco: Never Used  . Alcohol use 2.4 oz/week    4 Standard drinks or equivalent per week     Comment: beers  . Drug use: No  . Sexual activity: No   Other Topics Concern  . Not on file   Social History Narrative   He is originally from [place]. He has a history of long-standing tobacco use but quit several years ago. He also has a history of former alcohol abuse as well.   He is retired and currently single.   Review of Systems  Gastrointestinal: Positive for abdominal pain, nausea and vomiting. Negative for diarrhea.      Objective:   Physical Exam  Constitutional: He is oriented to person, place, and time. He appears well-developed and well-nourished. No distress.  HENT:  Head: Normocephalic and atraumatic.  Eyes: Conjunctivae and EOM are normal.  Neck: Neck supple. No tracheal deviation present.  Cardiovascular: Normal rate.   Pulmonary/Chest: Effort normal. No respiratory distress.  Genitourinary:  Genitourinary Comments: Large hernia R inguinal area with warmth. Some difficulty with reduction. Tender over affected area.  Musculoskeletal: Normal range of motion.  Neurological: He is alert and oriented to person, place, and time.  Skin: Skin is warm and dry.  Psychiatric: He has a normal mood and affect. His behavior is normal.  Nursing note and vitals reviewed.  Vitals:   10/10/16 1643 10/10/16 1656  BP: (!) 151/85 137/72  Pulse: 85   Resp: 18   Temp: 98.2 F (36.8 C)   TempSrc: Oral   SpO2: 99%   Weight: 157 lb 9.6 oz (71.5 kg)   Height: 5\' 5"  (1.651 m)       Assessment & Plan:  [name] is a 83 y.o. male Right inguinal hernia  Cyclical vomiting with nausea, intractability of vomiting not specified  Right  inguinal pain  Right inguinal hernia, increasing pain with standing past month, acute worsening of pain since last night. Unable to achieve comfort overnight then proceeded to nausea and vomiting today, most recent vomitus 20 minutes prior to office visit and reported large volume. No bowel movement yet today. Unable to completely reduce right inguinal hernia, and some skin discomfort in the affected area. Concern for incarcerated hernia with possible obstruction.  -Discussed with charge nurse at [facility] ER. Patient plans to drive himself there as soon as leaving office, 911 precautions given if worsening on the way there.  No orders of the defined types were placed in this encounter.  Patient Instructions   Go to [facility] ER after leaving our office as I am concerned about that hernia becoming stuck and intestine obstruction. I advised the staff you were on the way.   Hernia, Adult A hernia is the bulging of an organ or tissue through a  weak spot in the muscles of the abdomen (abdominal wall). Hernias develop most often near the navel or groin. There are many kinds of hernias. Common kinds include:  Femoral hernia. This kind of hernia develops under the groin in the upper thigh area.  Inguinal hernia. This kind of hernia develops in the groin or scrotum.  Umbilical hernia. This kind of hernia develops near the navel.  Hiatal hernia. This kind of hernia causes part of the stomach to be pushed up into the chest.  Incisional hernia. This kind of hernia bulges through a scar from an abdominal surgery. What are the causes? This condition may be caused by:  Heavy lifting.  Coughing over a long period of time.  Straining to have a bowel movement.  An incision made during an abdominal surgery.  A birth defect (congenital defect).  Excess weight or obesity.  Smoking.  Poor nutrition.  Cystic fibrosis.  Excess fluid in the abdomen.  Undescended testicles. What are the  signs or symptoms? Symptoms of a hernia include:  A lump on the abdomen. This is the first sign of a hernia. The lump may become more obvious with standing, straining, or coughing. It may get bigger over time if it is not treated or if the condition causing it is not treated.  Pain. A hernia is usually painless, but it may become painful over time if treatment is delayed. The pain is usually dull and may get worse with standing or lifting heavy objects. Sometimes a hernia gets tightly squeezed in the weak spot (strangulated) or stuck there (incarcerated) and causes additional symptoms. These symptoms may include:  Vomiting.  Nausea.  Constipation.  Irritability. How is this diagnosed? A hernia may be diagnosed with:  A physical exam. During the exam your health care provider may ask you to cough or to make a specific movement, because a hernia is usually more visible when you move.  Imaging tests. These can include:  X-rays.  Ultrasound.  CT scan. How is this treated? A hernia that is small and painless may not need to be treated. A hernia that is large or painful may be treated with surgery. Inguinal hernias may be treated with surgery to prevent incarceration or strangulation. Strangulated hernias are always treated with surgery, because lack of blood to the trapped organ or tissue can cause it to die. Surgery to treat a hernia involves pushing the bulge back into place and repairing the weak part of the abdomen. Follow these instructions at home:  Avoid straining.  Do not lift anything heavier than 10 lb (4.5 kg).  Lift with your leg muscles, not your back muscles. This helps avoid strain.  When coughing, try to cough gently.  Prevent constipation. Constipation leads to straining with bowel movements, which can make a hernia worse or cause a hernia repair to break down. You can prevent constipation by:  Eating a high-fiber diet that includes plenty of fruits and  vegetables.  Drinking enough fluids to keep your urine clear or pale yellow. Aim to drink 6-8 glasses of water per day.  Using a stool softener as directed by your health care provider.  Lose weight, if you are overweight.  Do not use any tobacco products, including cigarettes, chewing tobacco, or electronic cigarettes. If you need help quitting, ask your health care provider.  Keep all follow-up visits as directed by your health care provider. This is important. Your health care provider may need to monitor your condition. Contact a health care  provider if:  You have swelling, redness, and pain in the affected area.  Your bowel habits change. Get help right away if:  You have a fever.  You have abdominal pain that is getting worse.  You feel nauseous or you vomit.  You cannot push the hernia back in place by gently pressing on it while you are lying down.  The hernia:  Changes in shape or size.  Is stuck outside the abdomen.  Becomes discolored.  Feels hard or tender. This information is not intended to replace advice given to you by your health care provider. Make sure you discuss any questions you have with your health care provider. Document Released: 05/22/2005 Document Revised: 10/20/2015 Document Reviewed: 04/01/2014 Elsevier Interactive Patient Education  2017 [employer].   IF you received an x-ray today, you will receive an invoice from [facility] Radiology. Please contact [facility] at [phone] with questions or concerns regarding your invoice.   IF you received labwork today, you will receive an invoice from [place]. Please contact [facility] at [phone] with questions or concerns regarding your invoice.   Our billing staff will not be able to assist you with questions regarding bills from these companies.  You will be contacted with the lab results as soon as they are available. The fastest way to get your results is to activate your My  Chart account. Instructions are located on the last page of this paperwork. If you have not heard from [place] regarding the results in 2 weeks, please contact this office.      I personally performed the services described in this documentation, which was scribed in my presence. The recorded information has been reviewed and considered for accuracy and completeness, addended by me as needed, and agree with information above.  Signed,   [name], MD [facility] [place].  10/10/16 6:02 PM

## 2016-10-10 NOTE — ED Provider Notes (Signed)
I saw and evaluated the patient, reviewed the resident's note and I agree with the findings and plan.  Pt presented to the ED with right inguinal pain and swelling associated with a hernia.  Reduced in the ED by Dr Jethro Poling.  I examined the patient and he has no signs of swelling on my exam.  Hernia successfully reduced.   Dorie Rank, MD 10/10/16 [phone]

## 2016-10-10 NOTE — ED Notes (Signed)
Pt stable, ambulatory, states understanding of discharge instructions 

## 2016-11-09 ENCOUNTER — Ambulatory Visit (INDEPENDENT_AMBULATORY_CARE_PROVIDER_SITE_OTHER): Payer: Medicare Other | Admitting: Family Medicine

## 2016-11-09 ENCOUNTER — Encounter: Payer: Self-pay | Admitting: Family Medicine

## 2016-11-09 VITALS — BP 148/80 | HR 99 | Temp 98.0°F | Resp 18 | Ht 65.35 in | Wt 160.0 lb

## 2016-11-09 DIAGNOSIS — R3911 Hesitancy of micturition: Secondary | ICD-10-CM

## 2016-11-09 DIAGNOSIS — K4090 Unilateral inguinal hernia, without obstruction or gangrene, not specified as recurrent: Secondary | ICD-10-CM

## 2016-11-09 DIAGNOSIS — I10 Essential (primary) hypertension: Secondary | ICD-10-CM | POA: Diagnosis not present

## 2016-11-09 DIAGNOSIS — N401 Enlarged prostate with lower urinary tract symptoms: Secondary | ICD-10-CM

## 2016-11-09 NOTE — Patient Instructions (Addendum)
I would recommend following up with your surgeon to discuss hernia and options as you have had some more problems with it.  Call [phone] to schedule an appointment. If your surgeon does recommend surgery, then evaluation with cardiologist may be needed to clear you for surgery. Ok to wear the belt over area for now, but try to avoid heavy lifting as much as possible.  Return to the clinic or go to the nearest emergency room if any of your symptoms worsen or new symptoms occur.  Blood pressure up a little today. Watch salt in diet, but ok to continue same dose of medicine for now.  Recheck levels in next 3 months.   If you are having issues with sleep - return to discuss those further.     Hernia, Adult A hernia is the bulging of an organ or tissue through a weak spot in the muscles of the abdomen (abdominal wall). Hernias develop most often near the navel or groin. There are many kinds of hernias. Common kinds include:  Femoral hernia. This kind of hernia develops under the groin in the upper thigh area.  Inguinal hernia. This kind of hernia develops in the groin or scrotum.  Umbilical hernia. This kind of hernia develops near the navel.  Hiatal hernia. This kind of hernia causes part of the stomach to be pushed up into the chest.  Incisional hernia. This kind of hernia bulges through a scar from an abdominal surgery.  What are the causes? This condition may be caused by:  Heavy lifting.  Coughing over a long period of time.  Straining to have a bowel movement.  An incision made during an abdominal surgery.  A birth defect (congenital defect).  Excess weight or obesity.  Smoking.  Poor nutrition.  Cystic fibrosis.  Excess fluid in the abdomen.  Undescended testicles.  What are the signs or symptoms? Symptoms of a hernia include:  A lump on the abdomen. This is the first sign of a hernia. The lump may become more obvious with standing, straining, or coughing. It may  get bigger over time if it is not treated or if the condition causing it is not treated.  Pain. A hernia is usually painless, but it may become painful over time if treatment is delayed. The pain is usually dull and may get worse with standing or lifting heavy objects.  Sometimes a hernia gets tightly squeezed in the weak spot (strangulated) or stuck there (incarcerated) and causes additional symptoms. These symptoms may include:  Vomiting.  Nausea.  Constipation.  Irritability.  How is this diagnosed? A hernia may be diagnosed with:  A physical exam. During the exam your health care provider may ask you to cough or to make a specific movement, because a hernia is usually more visible when you move.  Imaging tests. These can include: ? X-rays. ? Ultrasound. ? CT scan.  How is this treated? A hernia that is small and painless may not need to be treated. A hernia that is large or painful may be treated with surgery. Inguinal hernias may be treated with surgery to prevent incarceration or strangulation. Strangulated hernias are always treated with surgery, because lack of blood to the trapped organ or tissue can cause it to die. Surgery to treat a hernia involves pushing the bulge back into place and repairing the weak part of the abdomen. Follow these instructions at home:  Avoid straining.  Do not lift anything heavier than 10 lb (4.5 kg).  Lift  with your leg muscles, not your back muscles. This helps avoid strain.  When coughing, try to cough gently.  Prevent constipation. Constipation leads to straining with bowel movements, which can make a hernia worse or cause a hernia repair to break down. You can prevent constipation by: ? Eating a high-fiber diet that includes plenty of fruits and vegetables. ? Drinking enough fluids to keep your urine clear or pale yellow. Aim to drink 6-8 glasses of water per day. ? Using a stool softener as directed by your health care  provider.  Lose weight, if you are overweight.  Do not use any tobacco products, including cigarettes, chewing tobacco, or electronic cigarettes. If you need help quitting, ask your health care provider.  Keep all follow-up visits as directed by your health care provider. This is important. Your health care provider may need to monitor your condition. Contact a health care provider if:  You have swelling, redness, and pain in the affected area.  Your bowel habits change. Get help right away if:  You have a fever.  You have abdominal pain that is getting worse.  You feel nauseous or you vomit.  You cannot push the hernia back in place by gently pressing on it while you are lying down.  The hernia: ? Changes in shape or size. ? Is stuck outside the abdomen. ? Becomes discolored. ? Feels hard or tender. This information is not intended to replace advice given to you by your health care provider. Make sure you discuss any questions you have with your health care provider. Document Released: 05/22/2005 Document Revised: 10/20/2015 Document Reviewed: 04/01/2014 Elsevier Interactive Patient Education  2017 [employer].  IF you received an x-ray today, you will receive an invoice from [facility]. Please contact [facility] at [phone] with questions or concerns regarding your invoice.   IF you received labwork today, you will receive an invoice from [place]. Please contact [facility] at [phone] with questions or concerns regarding your invoice.   Our billing staff will not be able to assist you with questions regarding bills from these companies.  You will be contacted with the lab results as soon as they are available. The fastest way to get your results is to activate your My Chart account. Instructions are located on the last page of this paperwork. If you have not heard from [place] regarding the results in 2 weeks, please contact this office.

## 2016-11-09 NOTE — Progress Notes (Signed)
Subjective:  This chart was scribed for [name], MD by Tamsen Roers, at [place] at [facility].  This patient was seen in room 25 and the patient's care was started at 9:54 AM.    Chief Complaint  Patient presents with  . Hypertension    follow up  . Hernia    follow up     Patient ID: [name], male    DOB: 01/04/1930, 83 y.o.   MRN: [id number]  HPI HPI Comments: [name] is a 83 y.o. male who presents to Primary Care at [facility] complaining for a follow up.  Patient has a history of hypertension, PAD, mitral valve regurgitation and inguinal hernia.     Valvular heart disease with MVR: cardiologist: Dr. Ellyn Hack but has not seen in some time. Echocardiogram in June 2014, moderate LVH, EF 65%.  Mild to moderate mitral regurgitation.     Hypertension: He is on Lisinopril HCTZ 10-12.5 mg QD.BP was controlled at his last visit with me.  ---Patient is complaint with taking his blood pressure medication daily. He states that he has not been eating as well as he should (eating more salt lately).  Patient denies any frequency. Denies chest pains, difficulty breathing, lightheadedness/dizziness.    Lab Results  Component Value Date   CREATININE 0.91 10/10/2016    BPH: borderline elevated PSA's prior at 5.6- 6 months ago.  4.6 last year but 5.7, 4 years ago. --- Patient states that he does not wake up at night to urinate but does have difficulty starting his stream intermittently.     Inguinal hernia: Right inguinal area has had multiple episodes of incarceration and increasing pain. We have discussed meeting with surgeon (Dr. Hassell Done) in the past.  He was seen most recently May 8th, sent to the ER after nausea and vomiting previous night and concerned for repeat incarcerated hernia and possible obstruction. It was reduced in the ED successfully. He was able to pass flatus in the ER and with reduction of hernia, obstruction less likely.  Recommendation to discuss  surgery with his general surgeon.---Patient does not have an upcoming appointment with Dr. Hassell Done.  He states that he went to the surgeons office "a couple of times" and was told that he has a hernia.  After that visit, he has not heard from their office. Patient notes that the last time he was having issues with his hernia, he was lifting a heavy computer from his car to his home.  Patient is not currently wearing his belt but does wear it regularly.     Sleep: Patient does not go to bed until 2 am every morning.  He then wakes up around 7:15 and states that he naps throughout the day.   Patient Active Problem List   Diagnosis Date Noted  . Essential hypertension 05/08/2013  . Claudication of calf muscles ([place]) 05/06/2013  . PAD (peripheral artery disease) ([place]) 05/05/2013  . Family history of early CAD 04/14/2013  . DOE (dyspnea on exertion) 04/14/2013  . Abnormal EKG 04/14/2013  . Calculus of bile duct without mention of cholecystitis or obstruction 11/27/2012  . Abdominal mass, right upper quadrant 11/18/2012  . Liver hemangioma 11/18/2012  . History of BPH 10/20/2012  . Valvular heart disease 10/17/2012   Past Medical History:  Diagnosis Date  . BPH (benign prostatic hyperplasia)   . Cataract   . Exertional dyspnea    with exertion  . Hypertension   . Mitral valvular regurgitation June  2014   Mild to moderate MR on echocardiogram  . PAD (peripheral artery disease) ([place])  December 2014   Lower extremity Dopplers/ABIs: RABI 0.29, LABI 0.66; bilateral external iliacs with significant diameter reduction; R. SFA occlusive disease throughout into popliteal artery with 0 vessel runoff. Anterior tibial reconstitutes distally. 70-99% reduction in all SFA. One-vessel runoff (anterior tibial)   Past Surgical History:  Procedure Laterality Date  . ABDOMINAL ANGIOGRAM  07/07/2013   Procedure: ABDOMINAL ANGIOGRAM;  Surgeon: Lorretta Harp, MD;  Location: [facility] CATH LAB;  Service:  Cardiovascular;;  . CHOLECYSTECTOMY  07/04/2010  . ERCP N/A 11/27/2012   Procedure: ENDOSCOPIC RETROGRADE CHOLANGIOPANCREATOGRAPHY (ERCP);  Surgeon: Inda Castle, MD;  Location: [facility]COPY;  Service: Endoscopy;  Laterality: N/A;  . EYE SURGERY Right   . LEFT HEART CATHETERIZATION WITH CORONARY ANGIOGRAM N/A 07/07/2013   Procedure: LEFT HEART CATHETERIZATION WITH CORONARY ANGIOGRAM;  Surgeon: Lorretta Harp, MD;  Location: [facility] CATH LAB;  Service: Cardiovascular;  Laterality: N/A;  . LOWER EXTREMITY ANGIOGRAM N/A 07/07/2013   Procedure: LOWER EXTREMITY ANGIOGRAM;  Surgeon: Lorretta Harp, MD;  Location: [facility] CATH LAB;  Service: Cardiovascular;  Laterality: N/A;  . NM MYOVIEW LTD  December 2014   Apical thinning but no ischemia. EF 55%  . PROSTATE SURGERY     Dr. Lowella Bandy  . TRANSTHORACIC ECHOCARDIOGRAM  June 2014   Basal septal thickening with moderate LVH. EF 65%. Normal wall motion. Diastolic dysfunction/NOS. Aortic sclerosis without stenosis. Mild to moderate MR   No Known Allergies Prior to Admission medications   Medication Sig Start Date End Date Taking? Authorizing Provider  lisinopril-hydrochlorothiazide (PRINZIDE,ZESTORETIC) 10-12.5 MG tablet TAKE 1 TABLET BY MOUTH DAILY 06/05/16   [name], MD   Social History   Social History  . Marital status: Single    Spouse name: N/A  . Number of children: N/A  . Years of education: N/A   Occupational History  . Retired Retired   Social History Main Topics  . Smoking status: Former Smoker    Types: Cigarettes    Quit date: 11/25/1980  . Smokeless tobacco: Never Used  . Alcohol use 2.4 oz/week    4 Standard drinks or equivalent per week     Comment: beers  . Drug use: No  . Sexual activity: No   Other Topics Concern  . Not on file   Social History Narrative   He is originally from [place]. He has a history of long-standing tobacco use but quit several years ago. He also has a history of former alcohol abuse as  well.   He is retired and currently single.      Review of Systems  Constitutional: Negative for chills, fatigue, fever and unexpected weight change.  Eyes: Negative for pain, redness, itching and visual disturbance.  Respiratory: Negative for cough, chest tightness and shortness of breath.   Cardiovascular: Negative for chest pain, palpitations and leg swelling.  Gastrointestinal: Negative for abdominal pain and blood in stool.  Genitourinary: Negative for frequency.       Weak stream.   Neurological: Negative for dizziness, light-headedness and headaches.       Objective:   Physical Exam  Constitutional: He is oriented to person, place, and time. He appears well-developed and well-nourished.  HENT:  Head: Normocephalic and atraumatic.  Eyes: EOM are normal. Pupils are equal, round, and reactive to light.  Neck: No JVD present. Carotid bruit is not present.  Cardiovascular: Normal rate, regular rhythm and normal heart  sounds.   No murmur heard. Pulmonary/Chest: Effort normal and breath sounds normal. No respiratory distress. He has no rales.  Abdominal: A hernia is present. Hernia confirmed positive in the right inguinal area (Easily reducible at present, nontender).  Musculoskeletal: He exhibits no edema.  Neurological: He is alert and oriented to person, place, and time.  Skin: Skin is warm and dry.  Psychiatric: He has a normal mood and affect.  Vitals reviewed.   Vitals:   11/09/16 0944  BP: (!) 148/80  Pulse: 99  Resp: 18  Temp: 98 F (36.7 C)  TempSrc: Oral  SpO2: 100%  Weight: 160 lb (72.6 kg)  Height: 5' 5.35" (1.66 m)         Assessment & Plan:  [name] is a 83 y.o. male Essential hypertension  - Minimally elevated in office, but previously controlled. Thought to be due to recent change in diet. Discussed diet changes, continue same dose of medication for now. Recent lab work from May noted. Follow-up in 3 months for assessment of blood  pressure.  Benign prostatic hyperplasia with urinary hesitancy  - Stable symptoms, last PSA overall stable. Plan on recheck PSA in 6 months, sooner if any new urinary symptoms or more problems with hesitancy/initiation.  Right inguinal hernia  - Has been a recurrent issue, few ER visits needed due to possible incarceration, most recent visit I suspected incarceration with possible obstruction as he had vomiting multiple times that day with increasing pain. Was able to be reduced in the ER, and currently stable with easily reducible hernia on exam.  -Recommended meeting with general surgery to decide if repair needed, but if so would recommend cardiology evaluation for preoperative clearance.   - ER/RTC precautions discussed  No orders of the defined types were placed in this encounter.  Patient Instructions   I would recommend following up with your surgeon to discuss hernia and options as you have had some more problems with it.  Call [phone] to schedule an appointment. If your surgeon does recommend surgery, then evaluation with cardiologist may be needed to clear you for surgery. Ok to wear the belt over area for now, but try to avoid heavy lifting as much as possible.  Return to the clinic or go to the nearest emergency room if any of your symptoms worsen or new symptoms occur.  Blood pressure up a little today. Watch salt in diet, but ok to continue same dose of medicine for now.  Recheck levels in next 3 months.   If you are having issues with sleep - return to discuss those further.     Hernia, Adult A hernia is the bulging of an organ or tissue through a weak spot in the muscles of the abdomen (abdominal wall). Hernias develop most often near the navel or groin. There are many kinds of hernias. Common kinds include:  Femoral hernia. This kind of hernia develops under the groin in the upper thigh area.  Inguinal hernia. This kind of hernia develops in the groin or  scrotum.  Umbilical hernia. This kind of hernia develops near the navel.  Hiatal hernia. This kind of hernia causes part of the stomach to be pushed up into the chest.  Incisional hernia. This kind of hernia bulges through a scar from an abdominal surgery.  What are the causes? This condition may be caused by:  Heavy lifting.  Coughing over a long period of time.  Straining to have a bowel movement.  An incision  made during an abdominal surgery.  A birth defect (congenital defect).  Excess weight or obesity.  Smoking.  Poor nutrition.  Cystic fibrosis.  Excess fluid in the abdomen.  Undescended testicles.  What are the signs or symptoms? Symptoms of a hernia include:  A lump on the abdomen. This is the first sign of a hernia. The lump may become more obvious with standing, straining, or coughing. It may get bigger over time if it is not treated or if the condition causing it is not treated.  Pain. A hernia is usually painless, but it may become painful over time if treatment is delayed. The pain is usually dull and may get worse with standing or lifting heavy objects.  Sometimes a hernia gets tightly squeezed in the weak spot (strangulated) or stuck there (incarcerated) and causes additional symptoms. These symptoms may include:  Vomiting.  Nausea.  Constipation.  Irritability.  How is this diagnosed? A hernia may be diagnosed with:  A physical exam. During the exam your health care provider may ask you to cough or to make a specific movement, because a hernia is usually more visible when you move.  Imaging tests. These can include: ? X-rays. ? Ultrasound. ? CT scan.  How is this treated? A hernia that is small and painless may not need to be treated. A hernia that is large or painful may be treated with surgery. Inguinal hernias may be treated with surgery to prevent incarceration or strangulation. Strangulated hernias are always treated with surgery,  because lack of blood to the trapped organ or tissue can cause it to die. Surgery to treat a hernia involves pushing the bulge back into place and repairing the weak part of the abdomen. Follow these instructions at home:  Avoid straining.  Do not lift anything heavier than 10 lb (4.5 kg).  Lift with your leg muscles, not your back muscles. This helps avoid strain.  When coughing, try to cough gently.  Prevent constipation. Constipation leads to straining with bowel movements, which can make a hernia worse or cause a hernia repair to break down. You can prevent constipation by: ? Eating a high-fiber diet that includes plenty of fruits and vegetables. ? Drinking enough fluids to keep your urine clear or pale yellow. Aim to drink 6-8 glasses of water per day. ? Using a stool softener as directed by your health care provider.  Lose weight, if you are overweight.  Do not use any tobacco products, including cigarettes, chewing tobacco, or electronic cigarettes. If you need help quitting, ask your health care provider.  Keep all follow-up visits as directed by your health care provider. This is important. Your health care provider may need to monitor your condition. Contact a health care provider if:  You have swelling, redness, and pain in the affected area.  Your bowel habits change. Get help right away if:  You have a fever.  You have abdominal pain that is getting worse.  You feel nauseous or you vomit.  You cannot push the hernia back in place by gently pressing on it while you are lying down.  The hernia: ? Changes in shape or size. ? Is stuck outside the abdomen. ? Becomes discolored. ? Feels hard or tender. This information is not intended to replace advice given to you by your health care provider. Make sure you discuss any questions you have with your health care provider. Document Released: 05/22/2005 Document Revised: 10/20/2015 Document Reviewed: 04/01/2014 Elsevier  Interactive Patient Education  2017 [place].  IF you received an x-ray today, you will receive an invoice from [facility]. Please contact [facility] at [phone] with questions or concerns regarding your invoice.   IF you received labwork today, you will receive an invoice from [facility]. Please contact [facility] at [phone] with questions or concerns regarding your invoice.   Our billing staff will not be able to assist you with questions regarding bills from these companies.  You will be contacted with the lab results as soon as they are available. The fastest way to get your results is to activate your My Chart account. Instructions are located on the last page of this paperwork. If you have not heard from [place] regarding the results in 2 weeks, please contact this office.       I personally performed the services described in this documentation, which was scribed in my presence. The recorded information has been reviewed and considered for accuracy and completeness, addended by me as needed, and agree with information above.  Signed,   [name], MD Primary Care at [place].  11/09/16 10:13 AM

## 2016-11-21 ENCOUNTER — Observation Stay (HOSPITAL_COMMUNITY)
Admission: EM | Admit: 2016-11-21 | Discharge: 2016-11-23 | Disposition: A | Discharge status: Home / Self Care | Payer: Medicare Other | Attending: Internal Medicine | Admitting: Internal Medicine

## 2016-11-21 ENCOUNTER — Encounter (HOSPITAL_COMMUNITY): Payer: Self-pay

## 2016-11-21 DIAGNOSIS — R404 Transient alteration of awareness: Secondary | ICD-10-CM | POA: Diagnosis not present

## 2016-11-21 DIAGNOSIS — I10 Essential (primary) hypertension: Secondary | ICD-10-CM | POA: Diagnosis present

## 2016-11-21 DIAGNOSIS — R9431 Abnormal electrocardiogram [ECG] [EKG]: Secondary | ICD-10-CM | POA: Insufficient documentation

## 2016-11-21 DIAGNOSIS — R55 Syncope and collapse: Principal | ICD-10-CM | POA: Insufficient documentation

## 2016-11-21 DIAGNOSIS — Z79899 Other long term (current) drug therapy: Secondary | ICD-10-CM | POA: Diagnosis not present

## 2016-11-21 DIAGNOSIS — Z87891 Personal history of nicotine dependence: Secondary | ICD-10-CM | POA: Insufficient documentation

## 2016-11-21 DIAGNOSIS — E860 Dehydration: Secondary | ICD-10-CM | POA: Insufficient documentation

## 2016-11-21 DIAGNOSIS — R42 Dizziness and giddiness: Secondary | ICD-10-CM | POA: Diagnosis not present

## 2016-11-21 LAB — CBG MONITORING, ED: GLUCOSE-CAPILLARY: 121 mg/dL — AB (ref 65–99)

## 2016-11-21 LAB — CBC
HCT: 41.1 % (ref 39.0–52.0)
Hemoglobin: 13.2 g/dL (ref 13.0–17.0)
MCH: 29 pg (ref 26.0–34.0)
MCHC: 32.1 g/dL (ref 30.0–36.0)
MCV: 90.3 fL (ref 78.0–100.0)
PLATELETS: 416 10*3/uL — AB (ref 150–400)
RBC: 4.55 MIL/uL (ref 4.22–5.81)
RDW: 14.1 % (ref 11.5–15.5)
WBC: 6.1 10*3/uL (ref 4.0–10.5)

## 2016-11-21 LAB — BASIC METABOLIC PANEL
Anion gap: 10 (ref 5–15)
BUN: 19 mg/dL (ref 6–20)
CALCIUM: 9.1 mg/dL (ref 8.9–10.3)
CO2: 26 mmol/L (ref 22–32)
CREATININE: 0.96 mg/dL (ref 0.61–1.24)
Chloride: 103 mmol/L (ref 101–111)
GFR calc Af Amer: >60 mL/min (ref >60)
GLUCOSE: 144 mg/dL — AB (ref 65–99)
Potassium: 3.7 mmol/L (ref 3.5–5.1)
Sodium: 139 mmol/L (ref 135–145)

## 2016-11-21 MED ORDER — ONDANSETRON HCL 4 MG/2ML IJ SOLN
4.0000 mg | Freq: Once | INTRAMUSCULAR | Status: AC
  Administered 2016-11-21: 4 mg via INTRAVENOUS
  Filled 2016-11-21: qty 2

## 2016-11-21 NOTE — ED Triage Notes (Signed)
Pt picked up from [place] c/o emesis, dizziness and dehydration. Pt had been outside for about an hour and 45 mins. Pt drenched in sweat upon arrival. Pt had 2 episodes of vomiting but now denies nausea. Pt denies syncopal episode. Initial EKG with Ems showed trigeminy but pt converted to NSR. VSS. 156/90 HR 100, 96% on room air. Pt a.o, nad

## 2016-11-21 NOTE — ED Notes (Signed)
Pt vomited approx 151ml in emesis bag after arrival to ED

## 2016-11-21 NOTE — ED Notes (Signed)
Pt transferred to a room and connected to the monitor.

## 2016-11-22 ENCOUNTER — Observation Stay (HOSPITAL_BASED_OUTPATIENT_CLINIC_OR_DEPARTMENT_OTHER): Payer: Medicare Other

## 2016-11-22 ENCOUNTER — Encounter (HOSPITAL_COMMUNITY): Payer: Self-pay | Admitting: Internal Medicine

## 2016-11-22 DIAGNOSIS — R55 Syncope and collapse: Secondary | ICD-10-CM | POA: Diagnosis present

## 2016-11-22 DIAGNOSIS — R9431 Abnormal electrocardiogram [ECG] [EKG]: Secondary | ICD-10-CM

## 2016-11-22 DIAGNOSIS — I360 Nonrheumatic tricuspid (valve) stenosis: Secondary | ICD-10-CM

## 2016-11-22 DIAGNOSIS — I10 Essential (primary) hypertension: Secondary | ICD-10-CM

## 2016-11-22 LAB — URINALYSIS, ROUTINE W REFLEX MICROSCOPIC
BACTERIA UA: NONE SEEN
BILIRUBIN URINE: NEGATIVE
Glucose, UA: NEGATIVE mg/dL
Hgb urine dipstick: NEGATIVE
Ketones, ur: NEGATIVE mg/dL
Nitrite: NEGATIVE
Protein, ur: 30 mg/dL — AB
SPECIFIC GRAVITY, URINE: 1.019 (ref 1.005–1.030)
pH: 6 (ref 5.0–8.0)

## 2016-11-22 LAB — ECHOCARDIOGRAM COMPLETE
AOASC: 39 cm
AVLVOTPG: 7 mmHg
CHL CUP STROKE VOLUME: 73 mL
E/e' ratio: 11.27
EWDT: 310 ms
Height: 65 in
LA vol A4C: 59.5 ml
LA vol: 78.1 mL
LAVOLIN: 42.9 mL/m2
LV E/e'average: 11.27
LV SIMPSON'S DISK: 62
LV TDI E'MEDIAL: 4.84
LV dias vol: 118 mL (ref 62–150)
LV e' LATERAL: 6.67 cm/s
LV sys vol index: 25 mL/m2
LVDIAVOLIN: 65 mL/m2
LVEEMED: 11.27
LVOT VTI: 30.3 cm
LVOT peak vel: 129 cm/s
LVSYSVOL: 45 mL (ref 21–61)
MV Dec: 310
MV Peak grad: 2 mmHg
MVPKAVEL: 130 m/s
MVPKEVEL: 75.2 m/s
RV LATERAL S' VELOCITY: 13.2 cm/s
RV TAPSE: 17.5 mm
RV sys press: 32 mmHg
Reg peak vel: 267 cm/s
TDI e' lateral: 6.67
TR max vel: 267 cm/s
Weight: 2505.6 oz

## 2016-11-22 LAB — BASIC METABOLIC PANEL
Anion gap: 5 (ref 5–15)
BUN: 15 mg/dL (ref 6–20)
CALCIUM: 9.1 mg/dL (ref 8.9–10.3)
CO2: 31 mmol/L (ref 22–32)
Chloride: 104 mmol/L (ref 101–111)
Creatinine, Ser: 0.88 mg/dL (ref 0.61–1.24)
GFR calc non Af Amer: >60 mL/min (ref >60)
Glucose, Bld: 121 mg/dL — ABNORMAL HIGH (ref 65–99)
Potassium: 3.9 mmol/L (ref 3.5–5.1)
Sodium: 140 mmol/L (ref 135–145)

## 2016-11-22 LAB — CBC
HCT: 42.6 % (ref 39.0–52.0)
HCT: 40 % (ref 39.0–52.0)
Hemoglobin: 13.8 g/dL (ref 13.0–17.0)
Hemoglobin: 12.7 g/dL — ABNORMAL LOW (ref 13.0–17.0)
MCH: 29.1 pg (ref 26.0–34.0)
MCH: 28.5 pg (ref 26.0–34.0)
MCHC: 32.4 g/dL (ref 30.0–36.0)
MCHC: 31.8 g/dL (ref 30.0–36.0)
MCV: 89.7 fL (ref 78.0–100.0)
MCV: 89.9 fL (ref 78.0–100.0)
PLATELETS: 415 10*3/uL — AB (ref 150–400)
PLATELETS: 416 10*3/uL — AB (ref 150–400)
RBC: 4.75 MIL/uL (ref 4.22–5.81)
RBC: 4.45 MIL/uL (ref 4.22–5.81)
RDW: 14.1 % (ref 11.5–15.5)
RDW: 14.2 % (ref 11.5–15.5)
WBC: 6.4 10*3/uL (ref 4.0–10.5)
WBC: 6 10*3/uL (ref 4.0–10.5)

## 2016-11-22 LAB — TROPONIN I
Troponin I: <0.03 ng/mL (ref <0.03)
Troponin I: <0.03 ng/mL (ref <0.03)

## 2016-11-22 LAB — CREATININE, SERUM: Creatinine, Ser: 0.88 mg/dL (ref 0.61–1.24)

## 2016-11-22 LAB — I-STAT TROPONIN, ED: Troponin i, poc: 0.02 ng/mL (ref 0.00–0.08)

## 2016-11-22 MED ORDER — LISINOPRIL 10 MG PO TABS
10.0000 mg | ORAL_TABLET | Freq: Every day | ORAL | Status: DC
  Administered 2016-11-22 – 2016-11-23 (×2): 10 mg via ORAL
  Filled 2016-11-22 (×2): qty 1

## 2016-11-22 MED ORDER — ASPIRIN 81 MG PO CHEW
324.0000 mg | CHEWABLE_TABLET | Freq: Once | ORAL | Status: AC
  Administered 2016-11-22: 324 mg via ORAL
  Filled 2016-11-22: qty 4

## 2016-11-22 MED ORDER — ACETAMINOPHEN 650 MG RE SUPP: 650.0000 mg | Freq: Four times a day (QID) | RECTAL | Status: DC | PRN

## 2016-11-22 MED ORDER — ACETAMINOPHEN 325 MG PO TABS: 650.0000 mg | ORAL_TABLET | Freq: Four times a day (QID) | ORAL | Status: DC | PRN

## 2016-11-22 MED ORDER — ENOXAPARIN SODIUM 40 MG/0.4ML ~~LOC~~ SOLN
40.0000 mg | SUBCUTANEOUS | Status: DC
  Administered 2016-11-22: 40 mg via SUBCUTANEOUS
  Filled 2016-11-22 (×2): qty 0.4

## 2016-11-22 MED ORDER — ONDANSETRON HCL 4 MG PO TABS
4.0000 mg | ORAL_TABLET | Freq: Four times a day (QID) | ORAL | Status: DC | PRN
Start: 2016-11-22 — End: 2016-11-23

## 2016-11-22 MED ORDER — ONDANSETRON HCL 4 MG/2ML IJ SOLN
4.0000 mg | Freq: Four times a day (QID) | INTRAMUSCULAR | Status: DC | PRN
Start: 2016-11-22 — End: 2016-11-23

## 2016-11-22 MED ORDER — SODIUM CHLORIDE 0.9 % IV SOLN
INTRAVENOUS | Status: AC
  Administered 2016-11-22 (×3): via INTRAVENOUS

## 2016-11-22 NOTE — H&P (Signed)
History and Physical    [name] MHD:[id number] DOB: [birth year] DOA: 11/21/2016  PCP: [name], MD  Patient coming from: Home.  Chief Complaint: Dizziness nausea vomiting.  HPI: [name] is a 83 y.o. male with history of hypertension was brought to the ER after patient had sudden onset of dizziness with nausea vomiting. Patient states he had gone for CT counsel meeting following which he went to the downtown [place] and was chatting with his friends when patient suddenly started developing dizziness following which patient felt nauseated. In trying to vomit patient felt dizzy and almost passed out. Patient was brought to the ER. Patient denies having had any new medications. Denies any abdominal pain diarrhea fever or chills or chest pain or palpitations.   ED Course: In the ER EKG shows ST elevations in lead 2. Cardiologist was consulted. On call cardiologist at bedside echo which as per the ER physician was unremarkable. Cardiologist recommended observation and further cardiac markers and official 2-D echo.  Review of Systems: As per HPI, rest all negative.   Past Medical History:  Diagnosis Date  . BPH (benign prostatic hyperplasia)   . Cataract   . Exertional dyspnea    with exertion  . Hypertension   . Mitral valvular regurgitation June 2014   Mild to moderate MR on echocardiogram  . PAD (peripheral artery disease) ([place])  December 2014   Lower extremity Dopplers/ABIs: RABI 0.29, LABI 0.66; bilateral external iliacs with significant diameter reduction; R. SFA occlusive disease throughout into popliteal artery with 0 vessel runoff. Anterior tibial reconstitutes distally. 70-99% reduction in all SFA. One-vessel runoff (anterior tibial)    Past Surgical History:  Procedure Laterality Date  . ABDOMINAL ANGIOGRAM  07/07/2013   Procedure: ABDOMINAL ANGIOGRAM;  Surgeon: Lorretta Harp, MD;  Location: [facility] CATH LAB;  Service: Cardiovascular;;  . CHOLECYSTECTOMY   07/04/2010  . ERCP N/A 11/27/2012   Procedure: ENDOSCOPIC RETROGRADE CHOLANGIOPANCREATOGRAPHY (ERCP);  Surgeon: Inda Castle, MD;  Location: [facility];  Service: Endoscopy;  Laterality: N/A;  . EYE SURGERY Right   . LEFT HEART CATHETERIZATION WITH CORONARY ANGIOGRAM N/A 07/07/2013   Procedure: LEFT HEART CATHETERIZATION WITH CORONARY ANGIOGRAM;  Surgeon: Lorretta Harp, MD;  Location: [facility] CATH LAB;  Service: Cardiovascular;  Laterality: N/A;  . LOWER EXTREMITY ANGIOGRAM N/A 07/07/2013   Procedure: LOWER EXTREMITY ANGIOGRAM;  Surgeon: Lorretta Harp, MD;  Location: [facility] CATH LAB;  Service: Cardiovascular;  Laterality: N/A;  . NM MYOVIEW LTD  December 2014   Apical thinning but no ischemia. EF 55%  . PROSTATE SURGERY     Dr. Lowella Bandy  . TRANSTHORACIC ECHOCARDIOGRAM  June 2014   Basal septal thickening with moderate LVH. EF 65%. Normal wall motion. Diastolic dysfunction/NOS. Aortic sclerosis without stenosis. Mild to moderate MR     reports that he quit smoking about 36 years ago. His smoking use included Cigarettes. He has never used smokeless tobacco. He reports that he drinks about 2.4 oz of alcohol per week . He reports that he does not use drugs.  No Known Allergies  Family History  Problem Relation Age of Onset  . Diabetes Sister   . Cancer Sister   . Cancer Mother   . Cancer Father   . Cancer Sister     Prior to Admission medications   Medication Sig Start Date End Date Taking? Authorizing Provider  lisinopril-hydrochlorothiazide (PRINZIDE,ZESTORETIC) 10-12.5 MG tablet TAKE 1 TABLET BY MOUTH DAILY 06/05/16  Yes [name],  MD    Physical Exam: Vitals:   11/21/16 2134 11/21/16 2137 11/21/16 2356 11/22/16 0000  BP: (!) 147/81  (!) 145/80 132/73  Pulse: 76  76   Resp: 18  20 15   Temp: 97.9 F (36.6 C)     TempSrc: Oral     SpO2: 97%  97%   Weight:  72.6 kg (160 lb)    Height:  5\' 5"  (1.651 m)        Constitutional: Moderately built and nourished. Vitals:    11/21/16 2134 11/21/16 2137 11/21/16 2356 11/22/16 0000  BP: (!) 147/81  (!) 145/80 132/73  Pulse: 76  76   Resp: 18  20 15   Temp: 97.9 F (36.6 C)     TempSrc: Oral     SpO2: 97%  97%   Weight:  72.6 kg (160 lb)    Height:  5\' 5"  (1.651 m)     Eyes: Anicteric no pallor. ENMT: No discharge from the ears eyes nose and mouth. Neck: No mass felt. No neck rigidity. Respiratory: No rhonchi or crepitations. Cardiovascular: S1 and S2 heard no murmurs appreciated. Abdomen: Soft nontender bowel sounds present. No guarding or rigidity. Musculoskeletal: No edema. No joint effusion. Skin: No rash. Skin appears warm. Neurologic: Alert awake oriented to time place and person. Moves all extremities. Psychiatric: Appears normal. Normal affect.   Labs on Admission: I have personally reviewed following labs and imaging studies  CBC:  Recent Labs Lab 11/21/16 2153  WBC 6.1  HGB 13.2  HCT 41.1  MCV 90.3  PLT 469*   Basic Metabolic Panel:  Recent Labs Lab 11/21/16 2153  NA 139  K 3.7  CL 103  CO2 26  GLUCOSE 144*  BUN 19  CREATININE 0.96  CALCIUM 9.1   GFR: Estimated Creatinine Clearance: 48.9 mL/min (by C-G formula based on SCr of 0.96 mg/dL). Liver Function Tests: No results for input(s): AST, ALT, ALKPHOS, BILITOT, PROT, ALBUMIN in the last 168 hours. No results for input(s): LIPASE, AMYLASE in the last 168 hours. No results for input(s): AMMONIA in the last 168 hours. Coagulation Profile: No results for input(s): INR, PROTIME in the last 168 hours. Cardiac Enzymes: No results for input(s): CKTOTAL, CKMB, CKMBINDEX, TROPONINI in the last 168 hours. BNP (last 3 results) No results for input(s): PROBNP in the last 8760 hours. HbA1C: No results for input(s): HGBA1C in the last 72 hours. CBG:  Recent Labs Lab 11/21/16 2254  GLUCAP 121*   Lipid Profile: No results for input(s): CHOL, HDL, LDLCALC, TRIG, CHOLHDL, LDLDIRECT in the last 72 hours. Thyroid Function  Tests: No results for input(s): TSH, T4TOTAL, FREET4, T3FREE, THYROIDAB in the last 72 hours. Anemia Panel: No results for input(s): VITAMINB12, FOLATE, FERRITIN, TIBC, IRON, RETICCTPCT in the last 72 hours. Urine analysis:    Component Value Date/Time   COLORURINE AMBER (A) 10/10/2016 1923   APPEARANCEUR CLOUDY (A) 10/10/2016 1923   LABSPEC 1.025 10/10/2016 1923   PHURINE 6.0 10/10/2016 1923   GLUCOSEU NEGATIVE 10/10/2016 1923   HGBUR NEGATIVE 10/10/2016 1923   BILIRUBINUR NEGATIVE 10/10/2016 1923   BILIRUBINUR small 10/17/2012 1356   KETONESUR 20 (A) 10/10/2016 1923   PROTEINUR 100 (A) 10/10/2016 1923   UROBILINOGEN 4.0 10/17/2012 1356   UROBILINOGEN 0.2 07/04/2010 0805   NITRITE NEGATIVE 10/10/2016 1923   LEUKOCYTESUR MODERATE (A) 10/10/2016 1923   Sepsis Labs: @LABRCNTIP (procalcitonin:4,lacticidven:4) )No results found for this or any previous visit (from the past 240 hour(s)).   Radiological Exams on Admission: No  results found.  EKG: Independently reviewed. Normal sinus rhythm with ST elevation in lead 2.  Assessment/Plan Principal Problem:   Near syncope Active Problems:   Essential hypertension    1. Near syncope with abnormal EKG - will cycle cardiac markers check 2-D echo check orthostatic gently hydrate hold hydrochlorothiazide. Symptoms most likely vasovagal. 2. Hypertension - will continue lisinopril but will hold hydrochlorothiazide for hydration.  UA is pending.   DVT prophylaxis: Lovenox. Code Status: Full code.  Family Communication: Discussed with patient.  Disposition Plan: Home.  Consults called: None.  Admission status: Observation.    Rise Patience MD [facility] Pager [phone].  If 7PM-7AM, please contact night-coverage [email or web address] Password [facility]  11/22/2016, 12:44 AM

## 2016-11-22 NOTE — Consult Note (Signed)
CARDIOLOGY CONSULT NOTE   Referring Physician: Dr. Tanna Furry Primary Physician: Dr. [name] Primary Cardiologist: Dr. Lyman Bishop Reason for Consultation: STE on EKG   HPI: Patient seen at the request of Dr. Tanna Furry for ST elevation on EKG.   [name] is an 83 yo AA male with a pmhx of PAD, BPH, remote tobacco abuse, and HTN who presents to [facility] for evaluation of dizziness.  Pt states that he attending the city council meeting this evening and then was outside with his friends for appx 1hr 45 min, as he does every evening, when he began to experience significant dizziness, nausea, lightheadedness, and diaphoresis.  He then got up to vomit into the trashcan, at which time the dizziness worsened and he nearly passed out.  He called EMS who brought him to the [facility] for further evaluation.  En route, VSS and EKG showed trigeminy.  He denied syncope, falls, chest pain, SOB, palpitations, fevers or chills.  He has not experienced similar symptoms in the past.   In the ED, his labs (including troponin) were unrevealing, however EKG showed evidence of ~16mm of new ST segment elevation in lead V2, which was not present on prior studies.    Review of Systems:     Cardiac Review of Systems: {Y] = yes [ ]  = no  Chest Pain [    ]  Resting SOB [   ] Exertional SOB  [  ]  Orthopnea [  ]   Pedal Edema [   ]    Palpitations [  ] Syncope  [  ]   Presyncope [ Y ]  General Review of Systems: [Y] = yes [  ]=no Constitional: recent weight change [  ]; anorexia [  ]; fatigue [Y ]; nausea [  ]; night sweats [  ]; fever [  ]; or chills [  ];                                                                     Eyes : blurred vision [  ]; diplopia [   ]; vision changes [  ];  Amaurosis fugax[  ]; Resp: cough [  ];  wheezing[  ];  hemoptysis[  ];  PND [  ];  GI:  Nausea Jazmín.Cullens ], vomiting[ Y ];  dysphagia[  ]; melena[  ];  hematochezia [  ]; heartburn[  ];   GU: kidney stones [  ]; hematuria[  ];   dysuria [   ];  nocturia[  ]; incontinence [  ];             Skin: rash, swelling[  ];, hair loss[  ];  peripheral edema[  ];  or itching[  ]; Musculosketetal: myalgias[  ];  joint swelling[  ];  joint erythema[  ];  joint pain[  ];  back pain[  ];  Heme/Lymph: bruising[  ];  bleeding[  ];  anemia[  ];  Neuro: TIA[  ];  headaches[  ];  stroke[  ];  vertigo[  ];  seizures[  ];   paresthesias[  ];  difficulty walking[  ];  Psych:depression[  ]; anxiety[  ];  Endocrine: diabetes[  ];  thyroid dysfunction[  ];  Other:  Past Medical History:  Diagnosis Date  . BPH (benign prostatic hyperplasia)   . Cataract   . Exertional dyspnea    with exertion  . Hypertension   . Mitral valvular regurgitation June 2014   Mild to moderate David on echocardiogram  . PAD (peripheral artery disease) ([place])  December 2014   Lower extremity Dopplers/ABIs: RABI 0.29, LABI 0.66; bilateral external iliacs with significant diameter reduction; R. SFA occlusive disease throughout into popliteal artery with 0 vessel runoff. Anterior tibial reconstitutes distally. 70-99% reduction in all SFA. One-vessel runoff (anterior tibial)     (Not in a hospital admission)   . aspirin  324 mg Oral Once    Infusions:   No Known Allergies  Social History   Social History  . Marital status: Single    Spouse name: N/A  . Number of children: N/A  . Years of education: N/A   Occupational History  . Retired Retired   Social History Main Topics  . Smoking status: Former Smoker    Types: Cigarettes    Quit date: 11/25/1980  . Smokeless tobacco: Never Used  . Alcohol use 2.4 oz/week    4 Standard drinks or equivalent per week     Comment: beers  . Drug use: No  . Sexual activity: No   Other Topics Concern  . Not on file   Social History Narrative   He is originally from [place]. He has a history of long-standing tobacco use but quit several years ago. He also has a history of former alcohol abuse as well.   He is retired  and currently single.    Family History  Problem Relation Age of Onset  . Diabetes Sister   . Cancer Sister   . Cancer Mother   . Cancer Father   . Cancer Sister     PHYSICAL EXAM: Vitals:   11/21/16 2356 11/22/16 0000  BP: (!) 145/80 132/73  Pulse: 76   Resp: 20 15  Temp:      No intake or output data in the 24 hours ending 11/22/16 0025  General:  Well appearing. No respiratory difficulty HEENT: normal Neck: supple. no JVD. Carotids 2+ bilat; no bruits. No lymphadenopathy or thryomegaly appreciated. Cor: PMI nondisplaced. Regular rate & rhythm. No rubs, gallops or murmurs. Lungs: clear Abdomen: soft, nontender, nondistended. No hepatosplenomegaly. No bruits or masses. Good bowel sounds. Extremities: no cyanosis, clubbing, rash, edema Neuro: alert & oriented x 3, cranial nerves grossly intact. moves all 4 extremities w/o difficulty. Affect pleasant.  ECG at 2322:  NSR, IVCD, LVH, 1 mm STE lead V2, baseline artifact ECG at 2221: NSR with PAC, IVCD, LVH, 1 mm STE in V2  Limited bedside cardiac ultrasound: Normal LV systolic function without evidence of regional WMA in the parasternal long and short, and apical 2 and 4 chamber views.   Results for orders placed or performed during the hospital encounter of 11/21/16 (from the past 24 hour(s))  Basic metabolic panel     Status: Abnormal   Collection Time: 11/21/16  9:53 PM  Result Value Ref Range   Sodium 139 135 - 145 mmol/L   Potassium 3.7 3.5 - 5.1 mmol/L   Chloride 103 101 - 111 mmol/L   CO2 26 22 - 32 mmol/L   Glucose, Bld 144 (H) 65 - 99 mg/dL   BUN 19 6 - 20 mg/dL   Creatinine, Ser 0.96 0.61 - 1.24 mg/dL   Calcium 9.1 8.9 - 10.3 mg/dL  GFR calc non Af Amer >60 >60 mL/min   GFR calc Af Amer >60 >60 mL/min   Anion gap 10 5 - 15  CBC     Status: Abnormal   Collection Time: 11/21/16  9:53 PM  Result Value Ref Range   WBC 6.1 4.0 - 10.5 K/uL   RBC 4.55 4.22 - 5.81 MIL/uL   Hemoglobin 13.2 13.0 - 17.0 g/dL    HCT 41.1 39.0 - 52.0 %   MCV 90.3 78.0 - 100.0 fL   MCH 29.0 26.0 - 34.0 pg   MCHC 32.1 30.0 - 36.0 g/dL   RDW 14.1 11.5 - 15.5 %   Platelets 416 (H) 150 - 400 K/uL  CBG monitoring, ED     Status: Abnormal   Collection Time: 11/21/16 10:54 PM  Result Value Ref Range   Glucose-Capillary 121 (H) 65 - 99 mg/dL   ASSESSMENT: 83 yo male with a pmhx of HTN and PAD who presents for evaluation of a near-syncopal episode, after which time he was noted to have STE in V2 on ECG, which is new from prior ECGs.  He has no other evidence of an acute coronary syndrome as his troponin is negative thus far, he has had absolutely no chest pain/SOB, and bedside cardiac US shows no evidence of regional wall motion abnormalities.   I suspect his symptoms are predominantly vasovagal in nature, however given his near syncope and STE, feel that overnight observation and ACS rule out is warranted.   PLAN/DISCUSSION: 1. Observe overnight on telemetry 2. Trend troponins.  If he develops chest pain or troponin is positive overnight, may need to consider urgent invasive evaluation 3. Formal echocardiogram in AM 4. Depending on his clinical course, may need to consider further ischemia eval prior to discharge.  5. Thank you for this consult, the cardiology consult team will follow along with you  Regino Bellow, DO 12:47 AM

## 2016-11-22 NOTE — Progress Notes (Signed)
Progress Note  Patient Name: [name] Date of Encounter: 11/22/2016  Primary Cardiologist: Hilty   Subjective   Feels good. Nice man. No CP, no SOB. No new syncope  Inpatient Medications    Scheduled Meds: . enoxaparin (LOVENOX) injection  40 mg Subcutaneous Q24H  . lisinopril  10 mg Oral Daily   Continuous Infusions: . sodium chloride 75 mL/hr at 11/22/16 0700   PRN Meds: acetaminophen **OR** acetaminophen, ondansetron **OR** ondansetron (ZOFRAN) IV   Vital Signs    Vitals:   11/22/16 0000 11/22/16 0100 11/22/16 0156 11/22/16 0456  BP: 132/73 (!) 152/91 133/77 126/73  Pulse:  77 82 74  Resp: 15 (!) 22 20 18   Temp:   97.5 F (36.4 C) 98.1 F (36.7 C)  TempSrc:   Oral Oral  SpO2:  97% 99% 100%  Weight:   156 lb 9.6 oz (71 kg)   Height:   5\' 5"  (1.651 m)     Intake/Output Summary (Last 24 hours) at 11/22/16 1148 Last data filed at 11/22/16 0700  Gross per 24 hour  Intake              375 ml  Output              700 ml  Net             -325 ml   Filed Weights   11/21/16 2137 11/22/16 0156  Weight: 160 lb (72.6 kg) 156 lb 9.6 oz (71 kg)    Telemetry    No asdverse rhythms - Personally Reviewed  ECG    NSR - J point elevation V2 (seen in 2014) accentuated, not STEMI - Personally Reviewed  Physical Exam   GEN: No acute distress.  Elderly Neck: No JVD Cardiac: RRR, no murmurs, rubs, or gallops.  Respiratory: Clear to auscultation bilaterally. GI: Soft, nontender, non-distended  MS: No edema; No deformity. Neuro:  Nonfocal  Psych: Normal affect   Labs    Chemistry Recent Labs Lab 11/21/16 2153 11/22/16 0101 11/22/16 0637  NA 139  --  140  K 3.7  --  3.9  CL 103  --  104  CO2 26  --  31  GLUCOSE 144*  --  121*  BUN 19  --  15  CREATININE 0.96 0.88 0.88  CALCIUM 9.1  --  9.1  GFRNONAA >60 >60 >60  GFRAA >60 >60 >60  ANIONGAP 10  --  5     Hematology Recent Labs Lab 11/21/16 2153 11/22/16 0101 11/22/16 0637  WBC 6.1 6.4  6.0  RBC 4.55 4.75 4.45  HGB 13.2 13.8 12.7*  HCT 41.1 42.6 40.0  MCV 90.3 89.7 89.9  MCH 29.0 29.1 28.5  MCHC 32.1 32.4 31.8  RDW 14.1 14.1 14.2  PLT 416* 415* 416*    Cardiac Enzymes Recent Labs Lab 11/22/16 0101 11/22/16 0637  TROPONINI <0.03 <0.03    Recent Labs Lab 11/22/16 0016  TROPIPOC 0.02     BNPNo results for input(s): BNP, PROBNP in the last 168 hours.   DDimer No results for input(s): DDIMER in the last 168 hours.   Radiology    No results found.  Cardiac Studies   Prior ECHO 2014  - Left ventricle: Upper septal thickening with no SAM and no LVOT gradient. The cavity size was normal. Wall thickness was increased in a pattern of moderate LVH. The estimated ejection fraction was 65%. Wall motion was normal; there were no regional wall motion abnormalities.  Findings consistent with left ventricular diastolic dysfunction. - Aortic valve: Very small target seen vibrating on the LVOT side of the aortic valve in diastole. This is probably a type of stranding. However, if clinical question, rule out vegetation (blood cultures). Sclerosis without stenosis. Trivial regurgitation. - Mitral valve: Mild to moderate regurgitation. - Left atrium: The atrium was moderately dilated. - Pulmonary arteries: PA peak pressure: 57mm Hg (S).  Patient Profile     83 y.o. male with abnormal ECG, near syncope  Assessment & Plan    Near syncope  - ECHO p  - Likely vagal  - Tele reassuring   Abnormal ECG  - agree, not STEMI  - trop neg  - NSR - J point elevation V2 (seen in 2014) accentuated, not STEMI - Personally Reviewed   Essential hypertension  - lisinopril 10. BP stable.   - agree with stopping HCTZ   If ECHO normal, OK for DC  Signed, Candee Furbish, MD  11/22/2016, 11:48 AM

## 2016-11-22 NOTE — Progress Notes (Signed)
83 year old gentleman admitted for an episode of dizziness, near syncope associated with nausea and vomiting.  Patient was admitted earlier this am, please see Dr Moise Boring note in detail.    Plan:  Cardiology consult and syncope work up with echocardiogram.  Cardiac enzymes.  Orthostatic vital signs.    Hosie Poisson, md [phone]

## 2016-11-22 NOTE — ED Provider Notes (Signed)
[facility] DEPT Provider Note   CSN: [id number] Arrival date & time: 11/21/16  2113     History   Chief Complaint Chief Complaint  Patient presents with  . Emesis  . Dehydration  . Dizziness    HPI [name] is a 83 y.o. male. Chief complaint is near syncope.  HPI:  83 year old male was in his normal state of health throughout the day. He sat 390 minute seek counsel meeting tonight. Afterwards he and his friend were at a park, "watching the ladies".  He was outside for about 90 minutes. This was between 8 and 10. He states that he felt nauseated. He became sweaty. He stood up. He felt dizzy. Denies spinning or vertigo symptoms. He thought he could pass out. He did not syncope. His friend became concerned. 911 was contacted. Upon being placed in the ambulance he was found in sinus rhythm. An IV was placed. Was not given fluids as he stated he felt better. He did have an episode of emesis prior to their arrival.  Only current significant medical history is hypertension he takes lisinopril and had clear thiazide. History of mitral regurg and some peripheral vascular disease.  He has no chest pain he has no shortness of breath he's not had either through the course of the evening.  Past Medical History:  Diagnosis Date  . BPH (benign prostatic hyperplasia)   . Cataract   . Exertional dyspnea    with exertion  . Hypertension   . Mitral valvular regurgitation June 2014   Mild to moderate MR on echocardiogram  . PAD (peripheral artery disease) ([place])  December 2014   Lower extremity Dopplers/ABIs: RABI 0.29, LABI 0.66; bilateral external iliacs with significant diameter reduction; R. SFA occlusive disease throughout into popliteal artery with 0 vessel runoff. Anterior tibial reconstitutes distally. 70-99% reduction in all SFA. One-vessel runoff (anterior tibial)    Patient Active Problem List   Diagnosis Date Noted  . Essential hypertension 05/08/2013  . Claudication of  calf muscles ([place]) 05/06/2013  . PAD (peripheral artery disease) ([place]) 05/05/2013  . Family history of early CAD 04/14/2013  . DOE (dyspnea on exertion) 04/14/2013  . Abnormal EKG 04/14/2013  . Calculus of bile duct without mention of cholecystitis or obstruction 11/27/2012  . Abdominal mass, right upper quadrant 11/18/2012  . Liver hemangioma 11/18/2012  . History of BPH 10/20/2012  . Valvular heart disease 10/17/2012    Past Surgical History:  Procedure Laterality Date  . ABDOMINAL ANGIOGRAM  07/07/2013   Procedure: ABDOMINAL ANGIOGRAM;  Surgeon: Lorretta Harp, MD;  Location: [facility] CATH LAB;  Service: Cardiovascular;;  . CHOLECYSTECTOMY  07/04/2010  . ERCP N/A 11/27/2012   Procedure: ENDOSCOPIC RETROGRADE CHOLANGIOPANCREATOGRAPHY (ERCP);  Surgeon: Inda Castle, MD;  Location: [facility] ENDOSCOPY;  Service: Endoscopy;  Laterality: N/A;  . EYE SURGERY Right   . LEFT HEART CATHETERIZATION WITH CORONARY ANGIOGRAM N/A 07/07/2013   Procedure: LEFT HEART CATHETERIZATION WITH CORONARY ANGIOGRAM;  Surgeon: Lorretta Harp, MD;  Location: [facility] CATH LAB;  Service: Cardiovascular;  Laterality: N/A;  . LOWER EXTREMITY ANGIOGRAM N/A 07/07/2013   Procedure: LOWER EXTREMITY ANGIOGRAM;  Surgeon: Lorretta Harp, MD;  Location: [facility] CATH LAB;  Service: Cardiovascular;  Laterality: N/A;  . NM MYOVIEW LTD  December 2014   Apical thinning but no ischemia. EF 55%  . PROSTATE SURGERY     Dr. Lowella Bandy  . TRANSTHORACIC ECHOCARDIOGRAM  June 2014   Basal septal thickening with moderate LVH. EF 65%. Normal  wall motion. Diastolic dysfunction/NOS. Aortic sclerosis without stenosis. Mild to moderate MR       Home Medications    Prior to Admission medications   Medication Sig Start Date End Date Taking? Authorizing Provider  lisinopril-hydrochlorothiazide (PRINZIDE,ZESTORETIC) 10-12.5 MG tablet TAKE 1 TABLET BY MOUTH DAILY 06/05/16  Yes [name], MD    Family History Family History  Problem Relation Age of  Onset  . Diabetes Sister   . Cancer Sister   . Cancer Mother   . Cancer Father   . Cancer Sister     Social History Social History  Substance Use Topics  . Smoking status: Former Smoker    Types: Cigarettes    Quit date: 11/25/1980  . Smokeless tobacco: Never Used  . Alcohol use 2.4 oz/week    4 Standard drinks or equivalent per week     Comment: beers     Allergies   Patient has no known allergies.   Review of Systems Review of Systems  Constitutional: Positive for diaphoresis and fatigue. Negative for appetite change, chills and fever.  HENT: Negative for mouth sores, sore throat and trouble swallowing.   Eyes: Negative for visual disturbance.  Respiratory: Negative for cough, chest tightness, shortness of breath and wheezing.   Cardiovascular: Negative for chest pain.  Gastrointestinal: Positive for nausea and vomiting. Negative for abdominal distention, abdominal pain and diarrhea.  Endocrine: Negative for polydipsia, polyphagia and polyuria.  Genitourinary: Negative for dysuria, frequency and hematuria.  Musculoskeletal: Negative for gait problem.  Skin: Negative for color change, pallor and rash.  Neurological: Negative for dizziness, syncope, light-headedness and headaches.       Near syncope  Hematological: Does not bruise/bleed easily.  Psychiatric/Behavioral: Negative for behavioral problems and confusion.     Physical Exam Updated Vital Signs BP 132/73   Pulse 76   Temp 97.9 F (36.6 C) (Oral)   Resp 15   Ht 5\' 5"  (1.651 m)   Wt 72.6 kg (160 lb)   SpO2 97%   BMI 26.63 kg/m   Physical Exam  Constitutional: He is oriented to person, place, and time. He appears well-developed and well-nourished. No distress.  HENT:  Head: Normocephalic.  Eyes: Conjunctivae are normal. Pupils are equal, round, and reactive to light. No scleral icterus.  Neck: Normal range of motion. Neck supple. No thyromegaly present.  Cardiovascular: Normal rate and regular  rhythm.  Exam reveals no gallop and no friction rub.   No murmur heard. Pulmonary/Chest: Effort normal and breath sounds normal. No respiratory distress. He has no wheezes. He has no rales.  Abdominal: Soft. Bowel sounds are normal. He exhibits no distension. There is no tenderness. There is no rebound.  Musculoskeletal: Normal range of motion.  Neurological: He is alert and oriented to person, place, and time.  Skin: Skin is warm and dry. No rash noted.  Psychiatric: He has a normal mood and affect. His behavior is normal.     ED Treatments / Results  Labs (all labs ordered are listed, but only abnormal results are displayed) Labs Reviewed  BASIC METABOLIC PANEL - Abnormal; Notable for the following:       Result Value   Glucose, Bld 144 (*)    All other components within normal limits  CBC - Abnormal; Notable for the following:    Platelets 416 (*)    All other components within normal limits  CBG MONITORING, ED - Abnormal; Notable for the following:    Glucose-Capillary 121 (*)  All other components within normal limits  URINALYSIS, ROUTINE W REFLEX MICROSCOPIC  I-STAT TROPOININ, ED    EKG  EKG Interpretation  Date/Time:  Tuesday November 21 2016 23:22:54 EDT Ventricular Rate:  82 PR Interval:    QRS Duration: 123 QT Interval:  421 QTC Calculation: 492 R Axis:   -69 Text Interpretation:  Sinus rhythm Nonspecific IVCD with LAD Left ventricular hypertrophy ST elevation, consider inferior injury Artifact in lead(s) I II III aVR aVL aVF Confirmed by Tanna Furry [phone]) on 11/21/2016 11:32:29 PM       Radiology No results found.  Procedures Procedures (including critical care time)  Medications Ordered in ED Medications  aspirin chewable tablet 324 mg (not administered)  ondansetron (ZOFRAN) injection 4 mg (4 mg Intravenous Given 11/21/16 2354)     Initial Impression / Assessment and Plan / ED Course  I have reviewed the triage vital signs and the nursing  notes.  Pertinent labs & imaging results that were available during my care of the patient were reviewed by me and considered in my medical decision making (see chart for details).   differential diagnosis heat exposure, an episode, arrhythmia. Doubt ACS. However, patient's EKG shows ST elevation 1 mm isolated lead V2. He is in a sinus based rhythm. Repeat at 1 hours shows similar changes suggestion of early elevation in V3. He has no recent changes.  On recheck he had a recurrent episode of emesis. He states he felt dizzy prior to his episode at the park. He again had emesis and now is symptomatically again. Repeat EKG is as above. Is discussed with cardiology fellow. He do not feel this represented ACS with patient having no symptoms. He is emergency room and is performed bedside echocardiogram showing an vigorous anterior wall. Patient be admitted. Observation bed. Serial enzymes. If he develops pain or elevation of enzymes will be seen again by cardiology. Call placed hospitalist for observation admission. Second troponin pending.  Final Clinical Impressions(s) / ED Diagnoses   Final diagnoses:  Near syncope    New Prescriptions New Prescriptions   No medications on file     Tanna Furry, MD 11/22/16 0025

## 2016-11-22 NOTE — Progress Notes (Signed)
Spoke with Dr Karleen Hampshire and modified ECHO to anticipated discharge. Called ECHO lab and made aware. Pt is aware. Pt resting with call bell within reach. Feels much better with no further issues.  Will continue to monitor. Payton Emerald, RN

## 2016-11-22 NOTE — Progress Notes (Signed)
  Echocardiogram 2D Echocardiogram has been performed.  Pryor Guettler G Zymere Patlan 11/22/2016, 3:11 PM

## 2016-11-22 NOTE — Care Management Obs Status (Signed)
[facility] NOTIFICATION   Patient Details  Name: CORTNEY MCKINNEY MRN: [id number] Date of Birth: [birth year]   Medicare Observation Status Notification Given:  Yes    Dawayne Patricia, RN 11/22/2016, 5:32 PM

## 2016-11-23 DIAGNOSIS — R55 Syncope and collapse: Secondary | ICD-10-CM | POA: Diagnosis not present

## 2016-11-23 DIAGNOSIS — I10 Essential (primary) hypertension: Secondary | ICD-10-CM | POA: Diagnosis not present

## 2016-11-23 MED ORDER — LISINOPRIL 10 MG PO TABS: 10.0000 mg | ORAL_TABLET | Freq: Every day | ORAL | 0 refills | Status: DC

## 2016-11-23 NOTE — Progress Notes (Signed)
[name] to be D/C'd Home per MD order.  Discussed with the patient and all questions fully answered.  VSS, Skin clean, dry and intact without evidence of skin break down, no evidence of skin tears noted. IV catheter discontinued intact. Site without signs and symptoms of complications. Dressing and pressure applied.  An After Visit Summary was printed and given to the patient.   D/c education completed with patient/family including follow up instructions, medication list, d/c activities limitations if indicated, with other d/c instructions as indicated by MD - patient able to verbalize understanding, all questions fully answered.   Patient instructed to return to ED, call 911, or call MD for any changes in condition.   Patient escorted via [place], and D/C home via private auto.  Karolee Ohs 11/23/2016 1:40 PM

## 2016-11-23 NOTE — Discharge Summary (Signed)
Physician Discharge Summary  [name] JJK:[id number] DOB: Oct 01, 1929 DOA: 11/21/2016  PCP: [name], MD  Admit date: 11/21/2016 Discharge date: 11/23/2016  Admitted From: [facility]. Disposition:  Home.   Recommendations for Outpatient Follow-up:  1. Follow up with PCP in 1-2 weeks 2. Please obtain BMP/CBC in one week   Discharge Condition:stable.  CODE STATUS: full code.  Diet recommendation: Heart Healthy  Brief/Interim Summary: 83 year old gentleman admitted for an episode of dizziness, near syncope associated with nausea and vomiting.   Discharge Diagnoses:  Principal Problem:   Near syncope Active Problems:   Essential hypertension  Near syncope: possibly a vaso vagal episode, and his symptoms resolved with fluids. Cardiology consulted and recommendations given. Cardiac enzymes negative, echocardiogram unremarkable.  Discharged home to follow up with PCP.    Hypertension. Well controlled.    Dehydration: resolved.    Discharge Instructions  Discharge Instructions    Diet - low sodium heart healthy    Complete by:  As directed    Discharge instructions    Complete by:  As directed    Please follow up with PCP in one week.     Allergies as of 11/23/2016   No Known Allergies     Medication List    STOP taking these medications   lisinopril-hydrochlorothiazide 10-12.5 MG tablet Commonly known as:  PRINZIDE,ZESTORETIC     TAKE these medications   lisinopril 10 MG tablet Commonly known as:  PRINIVIL,ZESTRIL Take 1 tablet (10 mg total) by mouth daily.      Follow-up Information    [name], MD. Schedule an appointment as soon as possible for a visit in 1 week(s).   Specialties:  Family Medicine, Sports Medicine Why:  follow up post hospitalizaton.  Contact information: [place] [address] [phone]          No Known Allergies  Consultations:  cardiology   Procedures/Studies:  No results  found. Echocardiogram.    Subjective: No chest pain , sob, nausea, or vomiting.   Discharge Exam: Vitals:   11/23/16 0519 11/23/16 0855  BP: (!) 143/72 121/66  Pulse: 64   Resp: 18   Temp: 98.2 F (36.8 C)    Vitals:   11/22/16 1312 11/22/16 2043 11/23/16 0519 11/23/16 0855  BP: 128/73 126/62 (!) 143/72 121/66  Pulse: 73 69 64   Resp: 18 18 18    Temp: 98 F (36.7 C) 97.7 F (36.5 C) 98.2 F (36.8 C)   TempSrc: Oral Oral Oral   SpO2: 100% 100% 100%   Weight:      Height:        General: Pt is alert, awake, not in acute distress Cardiovascular: RRR, S1/S2 +, no rubs, no gallops Respiratory: CTA bilaterally, no wheezing, no rhonchi Abdominal: Soft, NT, ND, bowel sounds + Extremities: no edema, no cyanosis    The results of significant diagnostics from this hospitalization (including imaging, microbiology, ancillary and laboratory) are listed below for reference.     Microbiology: No results found for this or any previous visit (from the past 240 hour(s)).   Labs: BNP (last 3 results) No results for input(s): BNP in the last 8760 hours. Basic Metabolic Panel:  Recent Labs Lab 11/21/16 2153 11/22/16 0101 11/22/16 0637  NA 139  --  140  K 3.7  --  3.9  CL 103  --  104  CO2 26  --  31  GLUCOSE 144*  --  121*  BUN 19  --  15  CREATININE 0.96 0.88 0.88  CALCIUM 9.1  --  9.1   Liver Function Tests: No results for input(s): AST, ALT, ALKPHOS, BILITOT, PROT, ALBUMIN in the last 168 hours. No results for input(s): LIPASE, AMYLASE in the last 168 hours. No results for input(s): AMMONIA in the last 168 hours. CBC:  Recent Labs Lab 11/21/16 2153 11/22/16 0101 11/22/16 0637  WBC 6.1 6.4 6.0  HGB 13.2 13.8 12.7*  HCT 41.1 42.6 40.0  MCV 90.3 89.7 89.9  PLT 416* 415* 416*   Cardiac Enzymes:  Recent Labs Lab 11/22/16 0101 11/22/16 0637 11/22/16 1212  TROPONINI <0.03 <0.03 <0.03   BNP: Invalid input(s): POCBNP CBG:  Recent Labs Lab  11/21/16 2254  GLUCAP 121*   D-Dimer No results for input(s): DDIMER in the last 72 hours. Hgb A1c No results for input(s): HGBA1C in the last 72 hours. Lipid Profile No results for input(s): CHOL, HDL, LDLCALC, TRIG, CHOLHDL, LDLDIRECT in the last 72 hours. Thyroid function studies No results for input(s): TSH, T4TOTAL, T3FREE, THYROIDAB in the last 72 hours.  Invalid input(s): FREET3 Anemia work up No results for input(s): VITAMINB12, FOLATE, FERRITIN, TIBC, IRON, RETICCTPCT in the last 72 hours. Urinalysis    Component Value Date/Time   COLORURINE YELLOW 11/22/2016 0101   APPEARANCEUR CLEAR 11/22/2016 0101   LABSPEC 1.019 11/22/2016 0101   PHURINE 6.0 11/22/2016 0101   GLUCOSEU NEGATIVE 11/22/2016 0101   HGBUR NEGATIVE 11/22/2016 0101   BILIRUBINUR NEGATIVE 11/22/2016 0101   BILIRUBINUR small 10/17/2012 1356   KETONESUR NEGATIVE 11/22/2016 0101   PROTEINUR 30 (A) 11/22/2016 0101   UROBILINOGEN 4.0 10/17/2012 1356   UROBILINOGEN 0.2 07/04/2010 0805   NITRITE NEGATIVE 11/22/2016 0101   LEUKOCYTESUR MODERATE (A) 11/22/2016 0101   Sepsis Labs Invalid input(s): PROCALCITONIN,  WBC,  LACTICIDVEN Microbiology No results found for this or any previous visit (from the past 240 hour(s)).   Time coordinating discharge: Over 30 minutes  SIGNED:   Hosie Poisson, MD  [facility] 11/23/2016, 11:14 AM Pager   If 7PM-7AM, please contact night-coverage [email or web address] Password [email or web address]

## 2016-11-23 NOTE — Progress Notes (Signed)
Progress Note  Patient Name: [name] Date of Encounter: 11/23/2016  Primary Cardiologist: Hilty   Subjective   No CP, no syncope, no SOB  Inpatient Medications    Scheduled Meds: . enoxaparin (LOVENOX) injection  40 mg Subcutaneous Q24H  . lisinopril  10 mg Oral Daily   Continuous Infusions:  PRN Meds: acetaminophen **OR** acetaminophen, ondansetron **OR** ondansetron (ZOFRAN) IV   Vital Signs    Vitals:   11/22/16 1312 11/22/16 2043 11/23/16 0519 11/23/16 0855  BP: 128/73 126/62 (!) 143/72 121/66  Pulse: 73 69 64   Resp: 18 18 18    Temp: 98 F (36.7 C) 97.7 F (36.5 C) 98.2 F (36.8 C)   TempSrc: Oral Oral Oral   SpO2: 100% 100% 100%   Weight:      Height:        Intake/Output Summary (Last 24 hours) at 11/23/16 1039 Last data filed at 11/23/16 1941  Gross per 24 hour  Intake              750 ml  Output             2650 ml  Net            -1900 ml   Filed Weights   11/21/16 2137 11/22/16 0156  Weight: 160 lb (72.6 kg) 156 lb 9.6 oz (71 kg)    Telemetry    No pauses - Personally Reviewed  ECG    NSR - J point elevation V2 (seen in 2014) accentuated, not STEMI - Personally Reviewed  Physical Exam   GEN: Well nourished, well developed, in no acute distress elderly HEENT: normal  Neck: no JVD, carotid bruits, or masses Cardiac: RRR; no murmurs, rubs, or gallops,no edema  Respiratory:  clear to auscultation bilaterally, normal work of breathing GI: soft, nontender, nondistended, + BS MS: no deformity or atrophy  Skin: warm and dry, no rash Neuro:  Alert and Oriented x 3, Strength and sensation are intact Psych: euthymic mood, full affect   Labs    Chemistry  Recent Labs Lab 11/21/16 2153 11/22/16 0101 11/22/16 0637  NA 139  --  140  K 3.7  --  3.9  CL 103  --  104  CO2 26  --  31  GLUCOSE 144*  --  121*  BUN 19  --  15  CREATININE 0.96 0.88 0.88  CALCIUM 9.1  --  9.1  GFRNONAA >60 >60 >60  GFRAA >60 >60 >60  ANIONGAP  10  --  5     Hematology  Recent Labs Lab 11/21/16 2153 11/22/16 0101 11/22/16 0637  WBC 6.1 6.4 6.0  RBC 4.55 4.75 4.45  HGB 13.2 13.8 12.7*  HCT 41.1 42.6 40.0  MCV 90.3 89.7 89.9  MCH 29.0 29.1 28.5  MCHC 32.1 32.4 31.8  RDW 14.1 14.1 14.2  PLT 416* 415* 416*    Cardiac Enzymes  Recent Labs Lab 11/22/16 0101 11/22/16 0637 11/22/16 1212  TROPONINI <0.03 <0.03 <0.03     Recent Labs Lab 11/22/16 0016  TROPIPOC 0.02     BNPNo results for input(s): BNP, PROBNP in the last 168 hours.   DDimer No results for input(s): DDIMER in the last 168 hours.   Radiology    No results found.  Cardiac Studies   ECHO 11/22/16 - Left ventricle: The cavity size was normal. Wall thickness was   normal. Systolic function was normal. The estimated ejection   fraction was in the range  of 60% to 65%. Wall motion was normal;   there were no regional wall motion abnormalities. Doppler   parameters are consistent with abnormal left ventricular   relaxation (grade 1 diastolic dysfunction). - Left atrium: The atrium was moderately dilated. - Pulmonary arteries: Systolic pressure was mildly increased. PA   peak pressure: 32 mm Hg (S).  Patient Profile     83 y.o. male with abnormal ECG, near syncope  Assessment & Plan    Near syncope  - Likely vagal  - Tele reassuring no pauses  Abnormal ECG  - agree, not STEMI  - trop neg  - NSR - J point elevation V2 (seen in 2014) accentuated, not STEMI - Personally Reviewed  Essential hypertension  - lisinopril 10. BP stable.   - agree with stopping HCTZ   ECHO was normal, OK for DC.   Signed, Candee Furbish, MD  11/23/2016, 10:39 AM

## 2016-12-21 ENCOUNTER — Telehealth: Payer: Self-pay | Admitting: Family Medicine

## 2016-12-21 NOTE — Telephone Encounter (Signed)
Pt was seen at [facility]  for dehydration.  Physician at the hospital told him to stop taking the BP meds Carlota Raspberry had originally wrote and to start taking lisinopril 10mg  tablets.  Pt is requesting a refill on this medication and states it makes him feel better than the other prescription.  Pt uses the [facility] pharmacy on [address] and only has 2 tablets left.

## 2016-12-22 MED ORDER — LISINOPRIL 10 MG PO TABS: 10.0000 mg | ORAL_TABLET | Freq: Every day | ORAL | 0 refills | Status: DC

## 2016-12-22 NOTE — Telephone Encounter (Signed)
Previous message should say no problem. Thanks.

## 2016-12-22 NOTE — Telephone Encounter (Signed)
Dr. Carlota Raspberry, please see this message. Last refill was on 6/21 # 30 - with no refill.

## 2016-12-22 NOTE — Telephone Encounter (Signed)
New problem. I sent in another 30 day supply, but it was recommended he follow-up with repeat blood counts and electrolytes within 1-2 weeks from hospital discharge it was on June 21. Please schedule him an appointment with me sometime next week if possible

## 2016-12-22 NOTE — Telephone Encounter (Signed)
[facility] called on pt's behalf  Checking the status of rx refill req./// pt informed them he was running out/ please call pt [phone] to advise

## 2017-01-16 ENCOUNTER — Other Ambulatory Visit: Payer: Self-pay | Admitting: Family Medicine

## 2017-02-15 ENCOUNTER — Ambulatory Visit: Payer: Medicare Other | Admitting: Family Medicine

## 2017-04-30 ENCOUNTER — Other Ambulatory Visit: Payer: Self-pay | Admitting: Family Medicine

## 2017-04-30 DIAGNOSIS — I10 Essential (primary) hypertension: Secondary | ICD-10-CM

## 2017-06-19 ENCOUNTER — Telehealth: Payer: Self-pay

## 2017-06-19 NOTE — Telephone Encounter (Signed)
Called pt to schedule AWV and patient says he has a new PCP in [place], [place] but could not remember the name.    Josepha Pigg, B.A.  Care Guide - [facility]

## 2017-08-09 ENCOUNTER — Emergency Department (HOSPITAL_COMMUNITY): Payer: Medicare Other

## 2017-08-09 ENCOUNTER — Encounter (HOSPITAL_COMMUNITY): Payer: Self-pay | Admitting: *Deleted

## 2017-08-09 ENCOUNTER — Inpatient Hospital Stay (HOSPITAL_COMMUNITY)
Admission: EM | Admit: 2017-08-09 | Discharge: 2017-08-14 | DRG: 065 | Disposition: A | Discharge status: Skilled Nursing Facility | Payer: Medicare Other | Attending: Internal Medicine | Admitting: Internal Medicine

## 2017-08-09 ENCOUNTER — Other Ambulatory Visit: Payer: Self-pay

## 2017-08-09 DIAGNOSIS — S0101XA Laceration without foreign body of scalp, initial encounter: Secondary | ICD-10-CM | POA: Diagnosis present

## 2017-08-09 DIAGNOSIS — I63512 Cerebral infarction due to unspecified occlusion or stenosis of left middle cerebral artery: Secondary | ICD-10-CM | POA: Diagnosis not present

## 2017-08-09 DIAGNOSIS — R402413 Glasgow coma scale score 13-15, at hospital admission: Secondary | ICD-10-CM | POA: Diagnosis present

## 2017-08-09 DIAGNOSIS — Z23 Encounter for immunization: Secondary | ICD-10-CM

## 2017-08-09 DIAGNOSIS — R55 Syncope and collapse: Secondary | ICD-10-CM | POA: Diagnosis present

## 2017-08-09 DIAGNOSIS — Z87891 Personal history of nicotine dependence: Secondary | ICD-10-CM

## 2017-08-09 DIAGNOSIS — N3000 Acute cystitis without hematuria: Secondary | ICD-10-CM

## 2017-08-09 DIAGNOSIS — K4090 Unilateral inguinal hernia, without obstruction or gangrene, not specified as recurrent: Secondary | ICD-10-CM | POA: Diagnosis present

## 2017-08-09 DIAGNOSIS — Z79899 Other long term (current) drug therapy: Secondary | ICD-10-CM

## 2017-08-09 DIAGNOSIS — I639 Cerebral infarction, unspecified: Secondary | ICD-10-CM | POA: Diagnosis not present

## 2017-08-09 DIAGNOSIS — I340 Nonrheumatic mitral (valve) insufficiency: Secondary | ICD-10-CM | POA: Diagnosis present

## 2017-08-09 DIAGNOSIS — I10 Essential (primary) hypertension: Secondary | ICD-10-CM | POA: Diagnosis present

## 2017-08-09 DIAGNOSIS — R2681 Unsteadiness on feet: Secondary | ICD-10-CM | POA: Diagnosis present

## 2017-08-09 DIAGNOSIS — X58XXXA Exposure to other specified factors, initial encounter: Secondary | ICD-10-CM | POA: Diagnosis present

## 2017-08-09 DIAGNOSIS — F0150 Vascular dementia without behavioral disturbance: Secondary | ICD-10-CM | POA: Diagnosis present

## 2017-08-09 DIAGNOSIS — I6523 Occlusion and stenosis of bilateral carotid arteries: Secondary | ICD-10-CM | POA: Diagnosis present

## 2017-08-09 DIAGNOSIS — W19XXXA Unspecified fall, initial encounter: Secondary | ICD-10-CM | POA: Diagnosis present

## 2017-08-09 DIAGNOSIS — J449 Chronic obstructive pulmonary disease, unspecified: Secondary | ICD-10-CM | POA: Diagnosis present

## 2017-08-09 DIAGNOSIS — W1830XA Fall on same level, unspecified, initial encounter: Secondary | ICD-10-CM | POA: Diagnosis present

## 2017-08-09 DIAGNOSIS — R8290 Unspecified abnormal findings in urine: Secondary | ICD-10-CM | POA: Insufficient documentation

## 2017-08-09 DIAGNOSIS — I272 Pulmonary hypertension, unspecified: Secondary | ICD-10-CM | POA: Diagnosis present

## 2017-08-09 DIAGNOSIS — H8110 Benign paroxysmal vertigo, unspecified ear: Secondary | ICD-10-CM | POA: Diagnosis present

## 2017-08-09 DIAGNOSIS — N390 Urinary tract infection, site not specified: Secondary | ICD-10-CM | POA: Diagnosis present

## 2017-08-09 DIAGNOSIS — R29702 NIHSS score 2: Secondary | ICD-10-CM | POA: Diagnosis present

## 2017-08-09 DIAGNOSIS — I739 Peripheral vascular disease, unspecified: Secondary | ICD-10-CM | POA: Diagnosis present

## 2017-08-09 HISTORY — DX: Cerebral infarction, unspecified: I63.9

## 2017-08-09 LAB — URINALYSIS, ROUTINE W REFLEX MICROSCOPIC
Bilirubin Urine: NEGATIVE
GLUCOSE, UA: NEGATIVE mg/dL
HGB URINE DIPSTICK: NEGATIVE
Ketones, ur: NEGATIVE mg/dL
Leukocytes, UA: NEGATIVE
NITRITE: NEGATIVE
PH: 6 (ref 5.0–8.0)
Protein, ur: 30 mg/dL — AB
SPECIFIC GRAVITY, URINE: 1.012 (ref 1.005–1.030)
Squamous Epithelial / LPF: NONE SEEN

## 2017-08-09 LAB — CBC
HEMATOCRIT: 42 % (ref 39.0–52.0)
Hemoglobin: 13.7 g/dL (ref 13.0–17.0)
MCH: 28.8 pg (ref 26.0–34.0)
MCHC: 32.6 g/dL (ref 30.0–36.0)
MCV: 88.4 fL (ref 78.0–100.0)
Platelets: 384 10*3/uL (ref 150–400)
RBC: 4.75 MIL/uL (ref 4.22–5.81)
RDW: 14.7 % (ref 11.5–15.5)
WBC: 6.3 10*3/uL (ref 4.0–10.5)

## 2017-08-09 LAB — I-STAT CHEM 8, ED
BUN: 11 mg/dL (ref 6–20)
CALCIUM ION: 1.09 mmol/L — AB (ref 1.15–1.40)
CHLORIDE: 101 mmol/L (ref 101–111)
Creatinine, Ser: 0.8 mg/dL (ref 0.61–1.24)
GLUCOSE: 114 mg/dL — AB (ref 65–99)
HCT: 42 % (ref 39.0–52.0)
Hemoglobin: 14.3 g/dL (ref 13.0–17.0)
Potassium: 3.7 mmol/L (ref 3.5–5.1)
Sodium: 143 mmol/L (ref 135–145)
TCO2: 31 mmol/L (ref 22–32)

## 2017-08-09 LAB — PROTIME-INR
INR: 1.07
Prothrombin Time: 13.8 seconds (ref 11.4–15.2)

## 2017-08-09 LAB — COMPREHENSIVE METABOLIC PANEL
ALK PHOS: 95 U/L (ref 38–126)
ALT: 9 U/L — AB (ref 17–63)
AST: 21 U/L (ref 15–41)
Albumin: 3.7 g/dL (ref 3.5–5.0)
Anion gap: 11 (ref 5–15)
BUN: 9 mg/dL (ref 6–20)
CALCIUM: 9.6 mg/dL (ref 8.9–10.3)
CO2: 27 mmol/L (ref 22–32)
CREATININE: 0.92 mg/dL (ref 0.61–1.24)
Chloride: 103 mmol/L (ref 101–111)
GFR calc non Af Amer: >60 mL/min (ref >60)
Glucose, Bld: 118 mg/dL — ABNORMAL HIGH (ref 65–99)
Potassium: 3.9 mmol/L (ref 3.5–5.1)
SODIUM: 141 mmol/L (ref 135–145)
Total Bilirubin: 1.4 mg/dL — ABNORMAL HIGH (ref 0.3–1.2)
Total Protein: 5.9 g/dL — ABNORMAL LOW (ref 6.5–8.1)

## 2017-08-09 LAB — DIFFERENTIAL
Basophils Absolute: 0 10*3/uL (ref 0.0–0.1)
Basophils Relative: 1 %
Eosinophils Absolute: 0 10*3/uL (ref 0.0–0.7)
Eosinophils Relative: 1 %
LYMPHS PCT: 17 %
Lymphs Abs: 1.1 10*3/uL (ref 0.7–4.0)
MONO ABS: 0.4 10*3/uL (ref 0.1–1.0)
MONOS PCT: 7 %
NEUTROS ABS: 4.7 10*3/uL (ref 1.7–7.7)
Neutrophils Relative %: 74 %

## 2017-08-09 LAB — RAPID URINE DRUG SCREEN, HOSP PERFORMED
AMPHETAMINES: NOT DETECTED
BARBITURATES: NOT DETECTED
Benzodiazepines: NOT DETECTED
Cocaine: NOT DETECTED
Opiates: NOT DETECTED
TETRAHYDROCANNABINOL: NOT DETECTED

## 2017-08-09 LAB — AMMONIA: Ammonia: 26 umol/L (ref 9–35)

## 2017-08-09 LAB — I-STAT TROPONIN, ED: Troponin i, poc: 0 ng/mL (ref 0.00–0.08)

## 2017-08-09 LAB — CBG MONITORING, ED: Glucose-Capillary: 116 mg/dL — ABNORMAL HIGH (ref 65–99)

## 2017-08-09 LAB — ETHANOL: Alcohol, Ethyl (B): <10 mg/dL (ref <10)

## 2017-08-09 LAB — APTT: aPTT: 31 seconds (ref 24–36)

## 2017-08-09 MED ORDER — LISINOPRIL 20 MG PO TABS
40.0000 mg | ORAL_TABLET | Freq: Every day | ORAL | Status: DC
  Administered 2017-08-10 – 2017-08-14 (×5): 40 mg via ORAL
  Filled 2017-08-09 (×5): qty 2

## 2017-08-09 MED ORDER — HYDRALAZINE HCL 20 MG/ML IJ SOLN
10.0000 mg | Freq: Four times a day (QID) | INTRAMUSCULAR | Status: DC | PRN
  Administered 2017-08-11: 10 mg via INTRAVENOUS
  Filled 2017-08-09: qty 1

## 2017-08-09 MED ORDER — LIDOCAINE-EPINEPHRINE (PF) 2 %-1:200000 IJ SOLN
10.0000 mL | Freq: Once | INTRAMUSCULAR | Status: AC
  Administered 2017-08-09: 10 mL via INTRADERMAL
  Filled 2017-08-09: qty 20

## 2017-08-09 MED ORDER — ACETAMINOPHEN 325 MG PO TABS: 650.0000 mg | ORAL_TABLET | Freq: Four times a day (QID) | ORAL | Status: DC | PRN

## 2017-08-09 MED ORDER — ACETAMINOPHEN 650 MG RE SUPP: 650.0000 mg | Freq: Four times a day (QID) | RECTAL | Status: DC | PRN

## 2017-08-09 MED ORDER — TETANUS-DIPHTH-ACELL PERTUSSIS 5-2.5-18.5 LF-MCG/0.5 IM SUSP
0.5000 mL | Freq: Once | INTRAMUSCULAR | Status: AC
  Administered 2017-08-09: 0.5 mL via INTRAMUSCULAR
  Filled 2017-08-09: qty 0.5

## 2017-08-09 MED ORDER — ATORVASTATIN CALCIUM 10 MG PO TABS
20.0000 mg | ORAL_TABLET | Freq: Every day | ORAL | Status: DC
  Administered 2017-08-10 – 2017-08-12 (×3): 20 mg via ORAL
  Filled 2017-08-09 (×3): qty 2

## 2017-08-09 MED ORDER — SODIUM CHLORIDE 0.9 % IV SOLN: 250.0000 mL | INTRAVENOUS | Status: DC | PRN

## 2017-08-09 MED ORDER — SODIUM CHLORIDE 0.9% FLUSH
3.0000 mL | Freq: Two times a day (BID) | INTRAVENOUS | Status: DC
  Administered 2017-08-10 – 2017-08-14 (×8): 3 mL via INTRAVENOUS

## 2017-08-09 MED ORDER — ASPIRIN EC 81 MG PO TBEC
81.0000 mg | DELAYED_RELEASE_TABLET | Freq: Every day | ORAL | Status: DC
  Administered 2017-08-10 – 2017-08-14 (×5): 81 mg via ORAL
  Filled 2017-08-09 (×5): qty 1

## 2017-08-09 MED ORDER — SODIUM CHLORIDE 0.9 % IV SOLN
1.0000 g | INTRAVENOUS | Status: DC
  Administered 2017-08-10: 1 g via INTRAVENOUS
  Filled 2017-08-09: qty 10

## 2017-08-09 MED ORDER — SODIUM CHLORIDE 0.9% FLUSH: 3.0000 mL | INTRAVENOUS | Status: DC | PRN

## 2017-08-09 NOTE — ED Provider Notes (Signed)
[facility] EMERGENCY DEPARTMENT Provider Note   CSN: [id number] Arrival date & time: 08/09/17  1833     History   Chief Complaint Chief Complaint  Patient presents with  . Loss of Consciousness    HPI [name] is a 84 y.o. male.  HPI  58 male presents the emergency department via EMS status post witnessed syncopal event [place].  There seems to be no prodrome and patient cannot recall events prior to syncopal event.  Patient had a ground-level fall striking the back of his head and has been confused upon arrival to the emergency department to time.  Patient has local pain to the back of his head but otherwise has no other complaints.  Patient lives alone in an apartment locally and denies any recent illness/trauma aside from today.  Patient denies any antiplatelet/anticoagulation therapy.    Past Medical History:  Diagnosis Date  . BPH (benign prostatic hyperplasia)   . Cataract   . Exertional dyspnea    with exertion  . Hypertension   . Mitral valvular regurgitation June 2014   Mild to moderate MR on echocardiogram  . PAD (peripheral artery disease) ([place])  December 2014   Lower extremity Dopplers/ABIs: RABI 0.29, LABI 0.66; bilateral external iliacs with significant diameter reduction; R. SFA occlusive disease throughout into popliteal artery with 0 vessel runoff. Anterior tibial reconstitutes distally. 70-99% reduction in all SFA. One-vessel runoff (anterior tibial)    Patient Active Problem List   Diagnosis Date Noted  . Near syncope 11/22/2016  . Essential hypertension 05/08/2013  . Claudication of calf muscles ([place]) 05/06/2013  . PAD (peripheral artery disease) ([place]) 05/05/2013  . Family history of early CAD 04/14/2013  . DOE (dyspnea on exertion) 04/14/2013  . Abnormal EKG 04/14/2013  . Calculus of bile duct without mention of cholecystitis or obstruction 11/27/2012  . Abdominal mass, right upper quadrant  11/18/2012  . Liver hemangioma 11/18/2012  . History of BPH 10/20/2012  . Valvular heart disease 10/17/2012    Past Surgical History:  Procedure Laterality Date  . ABDOMINAL ANGIOGRAM  07/07/2013   Procedure: ABDOMINAL ANGIOGRAM;  Surgeon: Lorretta Harp, MD;  Location: [facility] CATH LAB;  Service: Cardiovascular;;  . CHOLECYSTECTOMY  07/04/2010  . ERCP N/A 11/27/2012   Procedure: ENDOSCOPIC RETROGRADE CHOLANGIOPANCREATOGRAPHY (ERCP);  Surgeon: Inda Castle, MD;  Location: [facility] ENDOSCOPY;  Service: Endoscopy;  Laterality: N/A;  . EYE SURGERY Right   . LEFT HEART CATHETERIZATION WITH CORONARY ANGIOGRAM N/A 07/07/2013   Procedure: LEFT HEART CATHETERIZATION WITH CORONARY ANGIOGRAM;  Surgeon: Lorretta Harp, MD;  Location: [facility] CATH LAB;  Service: Cardiovascular;  Laterality: N/A;  . LOWER EXTREMITY ANGIOGRAM N/A 07/07/2013   Procedure: LOWER EXTREMITY ANGIOGRAM;  Surgeon: Lorretta Harp, MD;  Location: [facility] CATH LAB;  Service: Cardiovascular;  Laterality: N/A;  . NM MYOVIEW LTD  December 2014   Apical thinning but no ischemia. EF 55%  . PROSTATE SURGERY     Dr. Lowella Bandy  . TRANSTHORACIC ECHOCARDIOGRAM  June 2014   Basal septal thickening with moderate LVH. EF 65%. Normal wall motion. Diastolic dysfunction/NOS. Aortic sclerosis without stenosis. Mild to moderate MR       Home Medications    Prior to Admission medications   Medication Sig Start Date End Date Taking? Authorizing Provider  lisinopril (PRINIVIL,ZESTRIL) 10 MG tablet TAKE 1 TABLET BY MOUTH EVERY DAY 01/20/17   [name], MD    Family History Family  History  Problem Relation Age of Onset  . Diabetes Sister   . Cancer Sister   . Cancer Mother   . Cancer Father   . Cancer Sister     Social History Social History   Tobacco Use  . Smoking status: Former Smoker    Types: Cigarettes    Last attempt to quit: 11/25/1980    Years since quitting: 36.7  . Smokeless tobacco: Never Used  Substance Use Topics  . Alcohol  use: Yes    Alcohol/week: 2.4 oz    Types: 4 Standard drinks or equivalent per week    Comment: beers  . Drug use: No     Allergies   Patient has no known allergies.   Review of Systems Review of Systems  Review of Systems  Constitutional: Negative for fever and chills.  HENT: Negative for ear pain, sore throat and trouble swallowing.   Eyes: Negative for pain and visual disturbance.  Respiratory: Negative for cough and shortness of breath.   Cardiovascular: Negative for chest pain and leg swelling.  Gastrointestinal: Negative for nausea, vomiting, abdominal pain and diarrhea.  Genitourinary: Negative for dysuria, urgency and frequency.  Musculoskeletal: Negative for back pain and joint swelling.  Skin:head laceration Neurological: Negative for dizziness, speech difficulty, weakness and numbness; + confusion/syncope  Physical Exam Updated Vital Signs There were no vitals taken for this visit.  Physical Exam  Physical Exam Vitals:   08/10/17 0454 08/10/17 0641  BP: (!) 149/66 (!) 159/66  Pulse: 73 69  Resp: 18 20  Temp: 99 F (37.2 C) 98.1 F (36.7 C)  SpO2: 100% 100%   Constitutional: Patient is in no acute distress Head: Normocephalic and atraumatic.  Eyes: Extraocular motion intact, no scleral icterus Neck: Supple without meningismus, mass, or overt JVD Respiratory: Effort normal and breath sounds normal. No respiratory distress. CV: Heart regular rate and rhythm, no obvious murmurs.  Pulses +2 and symmetric Abdomen: Soft, non-tender, non-distended MSK: Extremities are atraumatic without deformity, ROM intact Skin: 2 cm laceration to scalp; neg galea involved on occipital portion Neuro: confused to time; CN II-XII intact; neg pronator; RAM intact;  Psychiatric: Mood and affect are normal.  ED Treatments / Results  Labs (all labs ordered are listed, but only abnormal results are displayed) Labs Reviewed - No data to display  EKG  EKG  Interpretation None       Radiology No results found.  Procedures .Marland KitchenLaceration Repair Date/Time: 08/10/2017 9:39 AM Performed by: Willette Alma, DO Authorized by: Willette Alma, DO   Consent:    Consent obtained:  Verbal   Risks discussed:  Infection, pain, retained foreign body, poor cosmetic result, need for additional repair, nerve damage, poor wound healing and vascular damage Anesthesia (see MAR for exact dosages):    Anesthesia method:  Local infiltration   Local anesthetic:  Lidocaine 2% WITH epi (2 ml) Laceration details:    Location:  Scalp Repair type:    Repair type:  Simple Pre-procedure details:    Preparation:  Patient was prepped and draped in usual sterile fashion Exploration:    Hemostasis achieved with:  Direct pressure   Contaminated: no   Treatment:    Area cleansed with:  Saline   Irrigation solution:  Sterile saline   Irrigation volume:  1000   Irrigation method:  Pressure wash   Visualized foreign bodies/material removed: no   Skin repair:    Repair method:  Staples   Number of staples:  4 Approximation:  Approximation:  Close   Vermilion border: well-aligned   Post-procedure details:    Dressing:  Sterile dressing   Patient tolerance of procedure:  Tolerated well, no immediate complications   (including critical care time)  Medications Ordered in ED Medications - No data to display   Initial Impression / Assessment and Plan / ED Course  I have reviewed the triage vital signs and the nursing notes.  Pertinent labs & imaging results that were available during my care of the patient were reviewed by me and considered in my medical decision making (see chart for details).     61 male presents the emergency department via EMS status post witnessed syncopal event while he [place].  There seems to be no prodrome and patient cannot recall events prior to syncopal event.  Patient had a ground-level fall striking the back  of his head and has been confused upon arrival to the emergency department to time.  Patient has local pain to the back of his head but otherwise has no other complaints.  Patient lives alone in an apartment locally and denies any recent illness/trauma aside from today.  Patient denies any antiplatelet/anticoagulation therapy.  The patient arrived hemodynamically stable well-appearing.  Physical exam as annotated above with superficial laceration to the scalp that was closed in the emergency department hemoglobin is 13.7 doubt anemia, glucose is 118 doubt hypoglycemia, good renal function remainder of labs unremarkable, troponin x1 is undetectable with no findings on EKG concerning for ACS/arrhythmia, EtOH is undetectable doubt acute alcohol intoxication, urine drug screen without gross findings, CT the head and C-spine with no acute findings suggestive of fracture/dislocation or ICH.  Patient has been well-appearing throughout this emergency department encounter.  Unclear etiology for patient's syncopal event in the setting of an 84 year old with with a fall.  Plan for admission to hospitalist group for observation.    Final Clinical Impressions(s) / ED Diagnoses   Final diagnoses:  None    ED Discharge Orders    None       Willette Alma, DO 08/10/17 2633    Varney Biles, MD 08/11/17 1332

## 2017-08-09 NOTE — ED Triage Notes (Signed)
[place] [facility] [place]. Pt had witnessed syncopal episode and hit back of head, has small abrasion to back of head. Per ems, pt has irregular HR with no hx of same.

## 2017-08-09 NOTE — H&P (Addendum)
TRH H&P   Patient Demographics:    [name], is a 84 y.o. male  MRN: [id number]   DOB - 03/30/1930  Admit Date - 08/09/2017  Outpatient Primary MD for the patient is Patient, [name]  Referring MD/NP/PA:     Outpatient Specialists:     Patient coming from: home  Chief Complaint  Patient presents with  . Loss of Consciousness      HPI:    [name]  is a 84 y.o. male, hx of Hypertension, PAD, CVA, MVR, c/o syncope.  Pt is a poor historian and can not recall the event. But apparently per ED, pt was standing and had a syncopal episode falling backwards and hitting the back of his head.    In ED<   CT brain IMPRESSION: 1. Small left occipital scalp contusion and laceration without underlying skull fracture. 2. Cerebral atrophy with chronic small vessel ischemic disease. Wedge-shaped hypodensities in the posterior parietal lobes left greater than right without hemorrhage. These likely represent age indeterminate watershed infarcts or likely subacute to chronic. Additional small cortical infarcts near the vertex of brain involving the high parietal lobe. 3. Cervical spondylosis without acute cervical spine fracture. 4. Lipoma deep to the left subscapularis muscle measuring at least 5.3 x 2.2 cm on axial series 8, image 79.  CXR IMPRESSION: No active cardiopulmonary disease.  Stable mild cardiomegaly.  HIp  IMPRESSION: No definite acute osseous abnormality.  Urinalysis Wbc 6-30, Rbc 0-5 Etoh <10 INR 1.07 PTT 31 Wbc 6.3, Hgb 13.7, Plt 384 Na 141, K 3.9,  Bun 9, Creatinine 0.92 Ast 21, Alt 9 Alk phos 95. T. Bili 1.4 Ammonia 26  Trop 0.00  Pt will be admitted for w/up of syncope.      Review of systems:    In addition to the HPI above,    No Fever-chills, No Headache, No changes with Vision or hearing, No problems swallowing food or  Liquids, No Chest pain, Cough or Shortness of Breath, No Abdominal pain, No Nausea or Vommitting, Bowel movements are regular, No Blood in stool or Urine, No dysuria, No new skin rashes or bruises, No new joints pains-aches,  No new weakness, tingling, numbness in any extremity, No recent weight gain or loss, No polyuria, polydypsia or polyphagia, No significant Mental Stressors.  A full 10 point Review of Systems was done, except as stated above, all other Review of Systems were negative.   With Past History of the following :    Past Medical History:  Diagnosis Date  . BPH (benign prostatic hyperplasia)   . Cataract   . Exertional dyspnea    with exertion  . Hypertension   . Mitral valvular regurgitation June 2014   Mild to moderate MR on echocardiogram  . PAD (peripheral artery disease) [facility])  December 2014   Lower extremity Dopplers/ABIs: RABI 0.29, LABI 0.66; bilateral external  iliacs with significant diameter reduction; R. SFA occlusive disease throughout into popliteal artery with 0 vessel runoff. Anterior tibial reconstitutes distally. 70-99% reduction in all SFA. One-vessel runoff (anterior tibial)  . Stroke [facility])       Past Surgical History:  Procedure Laterality Date  . ABDOMINAL ANGIOGRAM  07/07/2013   Procedure: ABDOMINAL ANGIOGRAM;  Surgeon: Lorretta Harp, MD;  Location: [facility] CATH LAB;  Service: Cardiovascular;;  . CHOLECYSTECTOMY  07/04/2010  . ERCP N/A 11/27/2012   Procedure: ENDOSCOPIC RETROGRADE CHOLANGIOPANCREATOGRAPHY (ERCP);  Surgeon: Inda Castle, MD;  Location: [facility] ENDOSCOPY;  Service: Endoscopy;  Laterality: N/A;  . EYE SURGERY Right   . LEFT HEART CATHETERIZATION WITH CORONARY ANGIOGRAM N/A 07/07/2013   Procedure: LEFT HEART CATHETERIZATION WITH CORONARY ANGIOGRAM;  Surgeon: Lorretta Harp, MD;  Location: [facility] CATH LAB;  Service: Cardiovascular;  Laterality: N/A;  . LOWER EXTREMITY ANGIOGRAM N/A 07/07/2013   Procedure: LOWER EXTREMITY ANGIOGRAM;   Surgeon: Lorretta Harp, MD;  Location: [facility] CATH LAB;  Service: Cardiovascular;  Laterality: N/A;  . NM MYOVIEW LTD  December 2014   Apical thinning but no ischemia. EF 55%  . PROSTATE SURGERY     Dr. Lowella Bandy  . TRANSTHORACIC ECHOCARDIOGRAM  June 2014   Basal septal thickening with moderate LVH. EF 65%. Normal wall motion. Diastolic dysfunction/NOS. Aortic sclerosis without stenosis. Mild to moderate MR      Social History:     Social History   Tobacco Use  . Smoking status: Former Smoker    Types: Cigarettes    Last attempt to quit: 11/25/1980    Years since quitting: 36.7  . Smokeless tobacco: Never Used  Substance Use Topics  . Alcohol use: Yes    Alcohol/week: 2.4 oz    Types: 4 Standard drinks or equivalent per week    Comment: beers     Lives - at home by self, has a sister that lives in [place] - typically walks by self   Family History :     Family History  Problem Relation Age of Onset  . Diabetes Sister   . Cancer Sister   . Cancer Mother   . Cancer Father   . Cancer Sister       Home Medications:   Prior to Admission medications   Medication Sig Start Date End Date Taking? Authorizing Provider  lisinopril (PRINIVIL,ZESTRIL) 40 MG tablet Take 40 mg by mouth daily. 07/18/17  Yes [provider]  lisinopril (PRINIVIL,ZESTRIL) 10 MG tablet TAKE 1 TABLET BY MOUTH EVERY DAY Patient not taking: Reported on 08/09/2017 01/20/17   [name], MD     Allergies:    No Known Allergies   Physical Exam:   Vitals  Blood pressure (!) 167/79, pulse 74, temperature 98.9 F (37.2 C), temperature source Oral, resp. rate 20, height 5' 5"  (1.651 m), weight 66.5 kg (146 lb 9.7 oz), SpO2 100 %.   1. General  lying in bed in NAD,    2. Normal affect and insight, Not Suicidal or Homicidal, Awake Alert, Oriented X 3.  3. No F.N deficits, ALL C.Nerves Intact, Strength 5/5 all 4 extremities, Sensation intact all 4 extremities, Plantars  down going.  4. Ears and Eyes appear Normal, Conjunctivae clear, PERRLA. Moist Oral Mucosa.  5. Supple Neck, No JVD, No cervical lymphadenopathy appriciated, No Carotid Bruits.  6. Symmetrical Chest wall movement, Good air movement bilaterally, CTAB.  7. RRR, No Gallops, Rubs or Murmurs, No Parasternal Heave.  8.  Positive Bowel Sounds, Abdomen Soft, No tenderness, No organomegaly appriciated,No rebound -guarding or rigidity.  9.  No Cyanosis, Normal Skin Turgor, No Skin Rash or Bruise.  10. Good muscle tone,  joints appear normal , no effusions, Normal ROM.  11. No Palpable Lymph Nodes in Neck or Axillae     Data Review:    CBC Recent Labs  Lab 08/09/17 2032 08/09/17 2043  WBC 6.3  --   HGB 13.7 14.3  HCT 42.0 42.0  PLT 384  --   MCV 88.4  --   MCH 28.8  --   MCHC 32.6  --   RDW 14.7  --   LYMPHSABS 1.1  --   MONOABS 0.4  --   EOSABS 0.0  --   BASOSABS 0.0  --    ------------------------------------------------------------------------------------------------------------------  Chemistries  Recent Labs  Lab 08/09/17 2032 08/09/17 2043  NA 141 143  K 3.9 3.7  CL 103 101  CO2 27  --   GLUCOSE 118* 114*  BUN 9 11  CREATININE 0.92 0.80  CALCIUM 9.6  --   AST 21  --   ALT 9*  --   ALKPHOS 95  --   BILITOT 1.4*  --    ------------------------------------------------------------------------------------------------------------------ estimated creatinine clearance is 58.7 mL/min (by C-G formula based on SCr of 0.8 mg/dL). ------------------------------------------------------------------------------------------------------------------ No results for input(s): TSH, T4TOTAL, T3FREE, THYROIDAB in the last 72 hours.  Invalid input(s): FREET3  Coagulation profile Recent Labs  Lab 08/09/17 2032  INR 1.07   ------------------------------------------------------------------------------------------------------------------- No results for input(s): DDIMER in the  last 72 hours. -------------------------------------------------------------------------------------------------------------------  Cardiac Enzymes No results for input(s): CKMB, TROPONINI, MYOGLOBIN in the last 168 hours.  Invalid input(s): CK ------------------------------------------------------------------------------------------------------------------ No results found for: BNP   ---------------------------------------------------------------------------------------------------------------  Urinalysis    Component Value Date/Time   COLORURINE YELLOW 08/09/2017 1856   APPEARANCEUR CLEAR 08/09/2017 1856   LABSPEC 1.012 08/09/2017 1856   PHURINE 6.0 08/09/2017 1856   GLUCOSEU NEGATIVE 08/09/2017 1856   HGBUR NEGATIVE 08/09/2017 1856   BILIRUBINUR NEGATIVE 08/09/2017 1856   BILIRUBINUR small 10/17/2012 1356   KETONESUR NEGATIVE 08/09/2017 1856   PROTEINUR 30 (A) 08/09/2017 1856   UROBILINOGEN 4.0 10/17/2012 1356   UROBILINOGEN 0.2 07/04/2010 0805   NITRITE NEGATIVE 08/09/2017 1856   LEUKOCYTESUR NEGATIVE 08/09/2017 1856    ----------------------------------------------------------------------------------------------------------------   Imaging Results:    Dg Chest 2 View  Result Date: 08/09/2017 CLINICAL DATA:  Syncopal episode EXAM: CHEST - 2 VIEW COMPARISON:  07/01/2013 FINDINGS: Low lung volumes. Stable mild cardiomegaly with tortuous ectatic aorta and calcification. No acute consolidation or pleural effusion. No pneumothorax. Metallic fragments over the left lower chest. IMPRESSION: No active cardiopulmonary disease.  Stable mild cardiomegaly. Electronically Signed   By: Donavan Foil M.D.   On: 08/09/2017 20:01   Dg Pelvis 1-2 Views  Result Date: 08/09/2017 CLINICAL DATA:  Fall EXAM: PELVIS - 1-2 VIEW COMPARISON:  CT 02/25/2016 FINDINGS: SI joints are non widened. Pubic symphysis and rami appear intact. No definite acute displaced fracture or malalignment. Mild  degenerative changes of both hips. Vascular calcifications. IMPRESSION: No definite acute osseous abnormality. Electronically Signed   By: Donavan Foil M.D.   On: 08/09/2017 20:02   Ct Head Wo Contrast  Result Date: 08/09/2017 CLINICAL DATA:  Altered level of consciousness. Witnessed syncopal episode hitting back of head. Small abrasion to back of head. EXAM: CT HEAD WITHOUT CONTRAST CT CERVICAL SPINE WITHOUT CONTRAST TECHNIQUE: Multidetector CT imaging of the head and cervical spine was  performed following the standard protocol without intravenous contrast. Multiplanar CT image reconstructions of the cervical spine were also generated. COMPARISON:  None. FINDINGS: CT HEAD FINDINGS Brain: Sulcal and ventricular prominence consistent with superficial and central atrophy. Wedge-shaped zones of hypodensity involving the posterior parietal lobes, left greater than right without definite encephalomalacia. Findings may represent subacute to chronic watershed infarcts. Additional small tiny cortical infarcts near the vertex of the right parietal lobe. No intra-axial mass nor extra-axial fluid collection. Vascular: Moderate atherosclerosis of the carotid siphons. No hyperdense vessel sign. Skull: Negative for fracture or focal osseous lesions. Sinuses/Orbits: Intact orbits and globes with bilateral lens replacements. Clear mastoids bilaterally. Minimal ethmoid sinus mucosal thickening. Other: Small left occipital scalp contusion with laceration. CT CERVICAL SPINE FINDINGS Alignment: Intact craniocervical relationship and atlantodental interval. Slight reversal cervical lordosis at C3 likely from patient positioning or muscle spasm. Skull base and vertebrae: No jumped or perched appearing facets. Intact occipital condyles. No fracture of the skull base. No acute fracture of the cervical spine. Mild prominence of Schmorl's nodes and vascular channels of the cervical spine simulating lytic disease. Soft tissues and spinal  canal: No prevertebral fluid or swelling. No visible canal hematoma. Disc levels: Moderate to marked disc space narrowing at all levels of the cervical spine from C2 through T1. Associated mild bilateral uncovertebral joint osteoarthritis is noted. Upper chest: Negative. Other: Partially included lipoma along the posterior aspect of the left shoulder is seen deep to the subscapularis. This measures at least 5.3 x 2.2 cm on series 8, image 79. IMPRESSION: 1. Small left occipital scalp contusion and laceration without underlying skull fracture. 2. Cerebral atrophy with chronic small vessel ischemic disease. Wedge-shaped hypodensities in the posterior parietal lobes left greater than right without hemorrhage. These likely represent age indeterminate watershed infarcts or likely subacute to chronic. Additional small cortical infarcts near the vertex of brain involving the high parietal lobe. 3. Cervical spondylosis without acute cervical spine fracture. 4. Lipoma deep to the left subscapularis muscle measuring at least 5.3 x 2.2 cm on axial series 8, image 79. Electronically Signed   By: Ashley Royalty M.D.   On: 08/09/2017 20:44   Ct Cervical Spine Wo Contrast  Result Date: 08/09/2017 CLINICAL DATA:  Altered level of consciousness. Witnessed syncopal episode hitting back of head. Small abrasion to back of head. EXAM: CT HEAD WITHOUT CONTRAST CT CERVICAL SPINE WITHOUT CONTRAST TECHNIQUE: Multidetector CT imaging of the head and cervical spine was performed following the standard protocol without intravenous contrast. Multiplanar CT image reconstructions of the cervical spine were also generated. COMPARISON:  None. FINDINGS: CT HEAD FINDINGS Brain: Sulcal and ventricular prominence consistent with superficial and central atrophy. Wedge-shaped zones of hypodensity involving the posterior parietal lobes, left greater than right without definite encephalomalacia. Findings may represent subacute to chronic watershed  infarcts. Additional small tiny cortical infarcts near the vertex of the right parietal lobe. No intra-axial mass nor extra-axial fluid collection. Vascular: Moderate atherosclerosis of the carotid siphons. No hyperdense vessel sign. Skull: Negative for fracture or focal osseous lesions. Sinuses/Orbits: Intact orbits and globes with bilateral lens replacements. Clear mastoids bilaterally. Minimal ethmoid sinus mucosal thickening. Other: Small left occipital scalp contusion with laceration. CT CERVICAL SPINE FINDINGS Alignment: Intact craniocervical relationship and atlantodental interval. Slight reversal cervical lordosis at C3 likely from patient positioning or muscle spasm. Skull base and vertebrae: No jumped or perched appearing facets. Intact occipital condyles. No fracture of the skull base. No acute fracture of the cervical spine. Mild prominence  of Schmorl's nodes and vascular channels of the cervical spine simulating lytic disease. Soft tissues and spinal canal: No prevertebral fluid or swelling. No visible canal hematoma. Disc levels: Moderate to marked disc space narrowing at all levels of the cervical spine from C2 through T1. Associated mild bilateral uncovertebral joint osteoarthritis is noted. Upper chest: Negative. Other: Partially included lipoma along the posterior aspect of the left shoulder is seen deep to the subscapularis. This measures at least 5.3 x 2.2 cm on series 8, image 79. IMPRESSION: 1. Small left occipital scalp contusion and laceration without underlying skull fracture. 2. Cerebral atrophy with chronic small vessel ischemic disease. Wedge-shaped hypodensities in the posterior parietal lobes left greater than right without hemorrhage. These likely represent age indeterminate watershed infarcts or likely subacute to chronic. Additional small cortical infarcts near the vertex of brain involving the high parietal lobe. 3. Cervical spondylosis without acute cervical spine fracture. 4.  Lipoma deep to the left subscapularis muscle measuring at least 5.3 x 2.2 cm on axial series 8, image 79. Electronically Signed   By: Ashley Royalty M.D.   On: 08/09/2017 20:44       Assessment & Plan:    Principal Problem:   Syncope    Syncope  h/o prior CVA on CT brain Tele Trop I q6h x3 MRI brain EEG carotid ultrasound Cardiac echo Check hga1c, check lipid Start Aspirin 53m po qday Start Lipitor 252mpo qhs   UTI Await urine culture Start Rocephin 1gm iv qday  Hypertension Cont lisinopril 4048mo qday   DVT Prophylaxis - SCDs  AM Labs Ordered, also please review Full Orders  Family Communication: Admission, patients condition and plan of care including tests being ordered have been discussed with the patient  who indicate understanding and agree with the plan and Code Status.  Code Status FULL CODE  Likely DC to  home  Condition GUARDED    Consults called: none  Admission status: observation   Time spent in minutes : 45   JamJani GravelD on 08/09/2017 at 11:14 PM  Between 7am to 7pm - Pager - [phone] After 7pm go to [email or web address] - password [email or web address] - Office  [phone]

## 2017-08-09 NOTE — ED Notes (Signed)
Patient transported to CT 

## 2017-08-10 ENCOUNTER — Observation Stay (HOSPITAL_COMMUNITY): Payer: Medicare Other

## 2017-08-10 ENCOUNTER — Observation Stay (HOSPITAL_BASED_OUTPATIENT_CLINIC_OR_DEPARTMENT_OTHER): Payer: Medicare Other

## 2017-08-10 DIAGNOSIS — S0101XA Laceration without foreign body of scalp, initial encounter: Secondary | ICD-10-CM | POA: Diagnosis not present

## 2017-08-10 DIAGNOSIS — I63512 Cerebral infarction due to unspecified occlusion or stenosis of left middle cerebral artery: Secondary | ICD-10-CM | POA: Diagnosis not present

## 2017-08-10 DIAGNOSIS — R8290 Unspecified abnormal findings in urine: Secondary | ICD-10-CM | POA: Diagnosis not present

## 2017-08-10 DIAGNOSIS — K4090 Unilateral inguinal hernia, without obstruction or gangrene, not specified as recurrent: Secondary | ICD-10-CM

## 2017-08-10 DIAGNOSIS — I739 Peripheral vascular disease, unspecified: Secondary | ICD-10-CM

## 2017-08-10 DIAGNOSIS — I361 Nonrheumatic tricuspid (valve) insufficiency: Secondary | ICD-10-CM | POA: Diagnosis not present

## 2017-08-10 DIAGNOSIS — I10 Essential (primary) hypertension: Secondary | ICD-10-CM | POA: Diagnosis not present

## 2017-08-10 DIAGNOSIS — R55 Syncope and collapse: Secondary | ICD-10-CM

## 2017-08-10 LAB — CBC
HCT: 38.5 % — ABNORMAL LOW (ref 39.0–52.0)
HEMOGLOBIN: 12.2 g/dL — AB (ref 13.0–17.0)
MCH: 28.1 pg (ref 26.0–34.0)
MCHC: 31.7 g/dL (ref 30.0–36.0)
MCV: 88.7 fL (ref 78.0–100.0)
PLATELETS: 345 10*3/uL (ref 150–400)
RBC: 4.34 MIL/uL (ref 4.22–5.81)
RDW: 15 % (ref 11.5–15.5)
WBC: 5.8 10*3/uL (ref 4.0–10.5)

## 2017-08-10 LAB — COMPREHENSIVE METABOLIC PANEL
ALK PHOS: 82 U/L (ref 38–126)
ALT: 8 U/L — AB (ref 17–63)
ANION GAP: 10 (ref 5–15)
AST: 12 U/L — ABNORMAL LOW (ref 15–41)
Albumin: 3.1 g/dL — ABNORMAL LOW (ref 3.5–5.0)
BUN: 8 mg/dL (ref 6–20)
CALCIUM: 8.5 mg/dL — AB (ref 8.9–10.3)
CO2: 26 mmol/L (ref 22–32)
CREATININE: 0.88 mg/dL (ref 0.61–1.24)
Chloride: 107 mmol/L (ref 101–111)
Glucose, Bld: 99 mg/dL (ref 65–99)
Potassium: 3.4 mmol/L — ABNORMAL LOW (ref 3.5–5.1)
Sodium: 143 mmol/L (ref 135–145)
TOTAL PROTEIN: 5.3 g/dL — AB (ref 6.5–8.1)
Total Bilirubin: 0.6 mg/dL (ref 0.3–1.2)

## 2017-08-10 LAB — TROPONIN I

## 2017-08-10 LAB — LIPID PANEL
CHOL/HDL RATIO: 3.1 ratio
CHOLESTEROL: 101 mg/dL (ref 0–200)
HDL: 33 mg/dL — ABNORMAL LOW (ref >40)
LDL Cholesterol: 53 mg/dL (ref 0–99)
Triglycerides: 74 mg/dL (ref <150)
VLDL: 15 mg/dL (ref 0–40)

## 2017-08-10 LAB — TSH: TSH: 0.903 u[IU]/mL (ref 0.350–4.500)

## 2017-08-10 LAB — ECHOCARDIOGRAM COMPLETE
HEIGHTINCHES: 65 in
Weight: 2345.69 oz

## 2017-08-10 MED ORDER — ENSURE ENLIVE PO LIQD
237.0000 mL | Freq: Two times a day (BID) | ORAL | Status: DC
  Administered 2017-08-10 – 2017-08-14 (×5): 237 mL via ORAL

## 2017-08-10 NOTE — Progress Notes (Signed)
PT Cancellation Note  Patient Details Name: [name] MRN: [id number] DOB: Jun 05, 1930   Cancelled Treatment:    Reason Eval/Treat Not Completed: Patient at procedure or test/unavailable Attempted to work with patient this morning, however he was not available/at procedure. Plan to attempt to return as/if schedule allows.    Deniece Ree PT, DPT, CBIS  Supplemental Physical Therapist [facility]   Pager [phone]

## 2017-08-10 NOTE — Consult Note (Addendum)
[name] NOTE   MRN : [id number]  Reason for Consult: PAD Referring Physician: Dr. Teryl Lucy  History of Present Illness: 84 y/o male states he walks very little, but he does not have calf pain or any leg pain with ambulation.  He also denise history of non healing wounds on his legs or feet.   He has documented history of right LE claudication symptoms starting as far back as 2012.  He was seen by Dr. Gwenlyn Found for angiogram secondary to right ABI of 0.29 as well as a left ABI of 0.66 with a high-grade lesion in the proximal left SFA.  Angiogram was performed 07/07/2013 by Dr. Gwenlyn Found.   Findings: Right lower extremity-press-fit was occluded at its origin. The only reconstitution was of the peroneal vessel by both profundus femoris and geniculate collaterals proximal and mid left SFA calcified high-grade stenoses. There was one vessel runoff via the peroneal.   No revascularization  intervention was suggested for the right LE, but arthrectomy was suggested for the left LE. The patient decided to post pone the procedure at that time.    He was brought to [facility] hospital secondary to a fall and hit his head secondary to reported syncopal episode.  Past medical history includes: HTN, Hx of PAD 2014, and COPD.         Current Facility-Administered Medications  Medication Dose Route Frequency Provider Last Rate Last Dose  . 0.9 %  sodium chloride infusion  250 mL Intravenous PRN Jani Gravel, MD      . acetaminophen (TYLENOL) tablet 650 mg  650 mg Oral Q6H PRN Jani Gravel, MD       Or  . acetaminophen (TYLENOL) suppository 650 mg  650 mg Rectal Q6H PRN Jani Gravel, MD      . aspirin EC tablet 81 mg  81 mg Oral Daily Jani Gravel, MD   81 mg at 08/10/17 1237  . atorvastatin (LIPITOR) tablet 20 mg  20 mg Oral q1800 Jani Gravel, MD   20 mg at 08/10/17 1728  . feeding supplement (ENSURE ENLIVE) (ENSURE ENLIVE) liquid 237 mL  237 mL Oral BID BM Oretha Milch D, MD   237 mL at  08/10/17 1727  . hydrALAZINE (APRESOLINE) injection 10 mg  10 mg Intravenous Q6H PRN Jani Gravel, MD      . lisinopril (PRINIVIL,ZESTRIL) tablet 40 mg  40 mg Oral Daily Jani Gravel, MD   40 mg at 08/10/17 1237  . sodium chloride flush (NS) 0.9 % injection 3 mL  3 mL Intravenous Q12H Jani Gravel, MD   3 mL at 08/10/17 1238  . sodium chloride flush (NS) 0.9 % injection 3 mL  3 mL Intravenous PRN Jani Gravel, MD        Pt meds include: Statin :No Betablocker: No, Lisinopril ASA: No Other anticoagulants/antiplatelets:   Past Medical History:  Diagnosis Date  . BPH (benign prostatic hyperplasia)   . Cataract   . Exertional dyspnea    with exertion  . Hypertension   . Mitral valvular regurgitation June 2014   Mild to moderate MR on echocardiogram  . PAD (peripheral artery disease) ([place])  December 2014   Lower extremity Dopplers/ABIs: RABI 0.29, LABI 0.66; bilateral external iliacs with significant diameter reduction; R. SFA occlusive disease throughout into popliteal artery with 0 vessel runoff. Anterior tibial reconstitutes distally. 70-99% reduction in all SFA. One-vessel runoff (anterior tibial)  . Stroke [facility])     Past Surgical History:  Procedure  Laterality Date  . ABDOMINAL ANGIOGRAM  07/07/2013   Procedure: ABDOMINAL ANGIOGRAM;  Surgeon: Lorretta Harp, MD;  Location: [facility] CATH LAB;  Service: Cardiovascular;;  . CHOLECYSTECTOMY  07/04/2010  . ERCP N/A 11/27/2012   Procedure: ENDOSCOPIC RETROGRADE CHOLANGIOPANCREATOGRAPHY (ERCP);  Surgeon: Inda Castle, MD;  Location: [facility];  Service: Endoscopy;  Laterality: N/A;  . EYE SURGERY Right   . LEFT HEART CATHETERIZATION WITH CORONARY ANGIOGRAM N/A 07/07/2013   Procedure: LEFT HEART CATHETERIZATION WITH CORONARY ANGIOGRAM;  Surgeon: Lorretta Harp, MD;  Location: [facility] CATH LAB;  Service: Cardiovascular;  Laterality: N/A;  . LOWER EXTREMITY ANGIOGRAM N/A 07/07/2013   Procedure: LOWER EXTREMITY ANGIOGRAM;  Surgeon: Lorretta Harp, MD;   Location: [facility] CATH LAB;  Service: Cardiovascular;  Laterality: N/A;  . NM MYOVIEW LTD  December 2014   Apical thinning but no ischemia. EF 55%  . PROSTATE SURGERY     Dr. Lowella Bandy  . TRANSTHORACIC ECHOCARDIOGRAM  June 2014   Basal septal thickening with moderate LVH. EF 65%. Normal wall motion. Diastolic dysfunction/NOS. Aortic sclerosis without stenosis. Mild to moderate MR    Social History Social History   Tobacco Use  . Smoking status: Former Smoker    Types: Cigarettes    Last attempt to quit: 11/25/1980    Years since quitting: 36.7  . Smokeless tobacco: Never Used  Substance Use Topics  . Alcohol use: Yes    Alcohol/week: 2.4 oz    Types: 4 Standard drinks or equivalent per week    Comment: beers  . Drug use: No    Family History Family History  Problem Relation Age of Onset  . Diabetes Sister   . Cancer Sister   . Cancer Mother   . Cancer Father   . Cancer Sister     No Known Allergies   REVIEW OF SYSTEMS  General: [ ]  Weight loss, [ ]  Fever, [ ]  chills Neurologic: [ ]  Dizziness, [ ]  Blackouts, [ ]  Seizure [ ]  Stroke, [ ]  "Mini stroke", [ ]  Slurred speech, [ ]  Temporary blindness; [ ]  weakness in arms or legs, [ ]  Hoarseness [ ]  Dysphagia Cardiac: [ ]  Chest pain/pressure, [ ]  Shortness of breath at rest [ ]  Shortness of breath with exertion, [ ]  Atrial fibrillation or irregular heartbeat  Vascular: [ ]  Pain in legs with walking, [ ]  Pain in legs at rest, [ ]  Pain in legs at night,  [ ]  Non-healing ulcer, [ ]  Blood clot in vein/DVT,   Pulmonary: [ ]  Home oxygen, [ ]  Productive cough, [ ]  Coughing up blood, [ ]  Asthma,  [ ]  Wheezing [ ]  COPD Musculoskeletal:  [ ]  Arthritis, [ ]  Low back pain, [ ]  Joint pain Hematologic: [ ]  Easy Bruising, [ ]  Anemia; [ ]  Hepatitis Gastrointestinal: [ ]  Blood in stool, [ ]  Gastroesophageal Reflux/heartburn, Urinary: [ ]  chronic Kidney disease, [ ]  on HD - [ ]  MWF or [ ]  TTHS, [ ]  Burning with urination, [ ]  Difficulty  urinating Skin: [ ]  Rashes, [ ]  Wounds Psychological: [ ]  Anxiety, [ ]  Depression  Physical Examination Vitals:   08/10/17 0256 08/10/17 0454 08/10/17 0641 08/10/17 1619  BP: (!) 151/69 (!) 149/66 (!) 159/66 (!) 149/66  Pulse: 67 73 69 64  Resp:  18 20 18   Temp: 98.2 F (36.8 C) 99 F (37.2 C) 98.1 F (36.7 C) 97.6 F (36.4 C)  TempSrc: Oral Oral Oral Oral  SpO2: 100% 100% 100% 100%  Weight:  Height:       Body mass index is 24.4 kg/m.  General:  WDWN in NAD HENT: WNL, nromocephalic Eyes: Pupils equal Pulmonary: normal non-labored breathing , without Rales, rhonchi,  wheezing Cardiac: RRR, without  Murmurs, rubs or gallops; No carotid bruits Abdomen: soft, NT, no masses Skin: no rashes, ulcers noted;  no Gangrene , no cellulitis; no open wounds;   Vascular Exam/Pulses:Palpable radial, brachial, femoral pulses B.  Doppler DP/ weak peroneals B.  Feet are warm to touch without ischemic changes.   Musculoskeletal: no muscle wasting or atrophy; no edema  Neurologic: A&O X 3; Appropriate Affect ;  SENSATION: normal; MOTOR FUNCTION: 5/5 Symmetric Speech is fluent/normal   Significant Diagnostic Studies: CBC Lab Results  Component Value Date   WBC 5.8 08/10/2017   HGB 12.2 (L) 08/10/2017   HCT 38.5 (L) 08/10/2017   MCV 88.7 08/10/2017   PLT 345 08/10/2017    BMET    Component Value Date/Time   NA 143 08/10/2017 0548   NA 142 05/11/2016 1343   K 3.4 (L) 08/10/2017 0548   CL 107 08/10/2017 0548   CO2 26 08/10/2017 0548   GLUCOSE 99 08/10/2017 0548   BUN 8 08/10/2017 0548   BUN 17 05/11/2016 1343   CREATININE 0.88 08/10/2017 0548   CREATININE 0.77 11/11/2015 0829   CALCIUM 8.5 (L) 08/10/2017 0548   GFRNONAA >60 08/10/2017 0548   GFRNONAA 83 11/11/2015 0829   GFRAA >60 08/10/2017 0548   GFRAA >89 11/11/2015 0829   Estimated Creatinine Clearance: 53.4 mL/min (by C-G formula based on SCr of 0.88 mg/dL).  COAG Lab Results  Component Value Date   INR  1.07 08/09/2017   INR 1.07 07/01/2013     Non-Invasive Vascular Imaging:  Vascular Ultrasound Carotid Duplex (Doppler) has been completed.   Findings suggest 1-39% internal carotid artery stenosis bilaterally. Vertebral arteries are patent with antegrade flow.   PAD (peripheral artery disease) ([place])  December 2014    Lower extremity Dopplers/ABIs: RABI 0.29, LABI 0.66; bilateral external iliacs with significant diameter reduction; R. SFA occlusive disease throughout into popliteal artery with 0 vessel runoff. Anterior tibial reconstitutes distally. 70-99% reduction in all SFA. One-vessel runoff (anterior tibial)       ASSESSMENT/PLAN:  Syncopal episode PAD infrainguinal with likely SFA occlusions.  He is asymptomatic without complaints of claudication during brief ambulation.  He has no history of non healing LE wounds.  Sensation and active range of motion is intact B LE.  We will order ABI's for a baseline.   Roxy Horseman 08/10/2017 7:01 PM   I have examined the patient, reviewed and agree with above.  History of arterial insufficiency.  Has strong peroneal Doppler bilaterally and peroneal and dorsalis pedis on the left.  Denies any claudication type symptoms and has had no tissue loss.  Carotid duplex is reviewed and showed no evidence of carotid occlusive disease.  Would not recommend any further evaluation or follow-up unless he becomes symptomatic from his ischemia or develops nonhealing lesions on his feet.  We will not follow actively.  Curt Jews, MD 08/10/2017 8:09 PM

## 2017-08-10 NOTE — Progress Notes (Signed)
*  PRELIMINARY RESULTS* Vascular Ultrasound Carotid Duplex (Doppler) has been completed.   Findings suggest 1-39% internal carotid artery stenosis bilaterally. Vertebral arteries are patent with antegrade flow.   08/10/2017 11:58 AM Maudry Mayhew, BS, RVT, RDCS, RDMS

## 2017-08-10 NOTE — Progress Notes (Signed)
  Echocardiogram 2D Echocardiogram has been performed.  Jennette Dubin 08/10/2017, 12:20 PM

## 2017-08-10 NOTE — Clinical Social Work Note (Signed)
Clinical Social Work Assessment  Patient Details  Name: [name] MRN: [id number] Date of Birth: 1930-05-08  Date of referral:  08/10/17               Reason for consult:  Facility Placement                Permission sought to share information with:  Facility Sport and exercise psychologist, Family Supports Permission granted to share information::  Yes, Verbal Permission Granted  Name::     Engineer, maintenance::  SNF  Relationship::  Friend  Contact Information:     Housing/Transportation Living arrangements for the past 2 months:  Apartment Source of Information:  Patient Patient Interpreter Needed:  None Criminal Activity/Legal Involvement Pertinent to Current Situation/Hospitalization:  No - Comment as needed Significant Relationships:  None Lives with:  Self Do you feel safe going back to the place where you live?  No Need for family participation in patient care:  Yes (Comment)(patient has bouts of confusion, but has no family support)  Care giving concerns:  Patient lives home alone and has been having increased difficulty caring for himself at home. Patient walks to the gas station for sandwiches and to the grocery store but has been forgetting how to use the microwave. Patient would benefit from short term rehab at discharge.   Social Worker assessment / plan:  CSW received permission from patient to fax out referral for SNF placement. CSW contacted patient's friend to assist with the patient making decisions, and left voicemail. CSW to continue to follow.  Employment status:  Retired Nurse, adult PT Recommendations:  [place] / Referral to community resources:  [place]  Patient/Family's Response to care:  Patient agreeable to SNF placement at this time.  Patient/Family's Understanding of and Emotional Response to Diagnosis, Current Treatment, and Prognosis:  Patient is worried about his growing inability  to care for himself and how he has no support available. Patient says his friend doesn't really help him with much, and he doesn't have anyone else. Patient discussed how he's able to walk to get to some things close to his apartment, but that buying so much take out food gets expensive. Patient concerned about his growing level of confusion, but aware that it is happening.  Emotional Assessment Appearance:  Appears stated age Attitude/Demeanor/Rapport:  Engaged Affect (typically observed):  Pleasant Orientation:  Oriented to Self Alcohol / Substance use:  Not Applicable Psych involvement (Current and /or in the community):  No (Comment)  Discharge Needs  Concerns to be addressed:  Care Coordination Readmission within the last 30 days:  No Current discharge risk:  Lives alone, Dependent with Mobility Barriers to Discharge:  Continued Medical Work up, [place], [place] 08/10/2017, 4:31 PM

## 2017-08-10 NOTE — Progress Notes (Signed)
EEG completed, results pending. 

## 2017-08-10 NOTE — NC FL2 (Signed)
[place] LEVEL OF CARE SCREENING TOOL     IDENTIFICATION  Patient Name: [name] Birthdate: 10-26-1929 Sex: male Admission Date (Current Location): 08/09/2017  [facility] and [place] Number:  Herbalist and Address:         Provider Number: [id number]  Attending Physician Name and Address:  Desiree Hane, MD  Relative Name and Phone Number:       Current Level of Care: Hospital Recommended Level of Care: [place] Prior Approval Number:    Date Approved/Denied:   PASRR Number: [id number]  Discharge Plan: SNF    Current Diagnoses: Patient Active Problem List   Diagnosis Date Noted  . Right inguinal hernia 08/10/2017  . Syncope 08/09/2017  . Abnormal finding on urinalysis 08/09/2017  . Near syncope 11/22/2016  . Essential hypertension 05/08/2013  . Claudication of calf muscles ([place]) 05/06/2013  . PAD (peripheral artery disease) ([place]) 05/05/2013  . Family history of early CAD 04/14/2013  . DOE (dyspnea on exertion) 04/14/2013  . Abnormal EKG 04/14/2013  . Calculus of bile duct without mention of cholecystitis or obstruction 11/27/2012  . Abdominal mass, right upper quadrant 11/18/2012  . Liver hemangioma 11/18/2012  . History of BPH 10/20/2012  . Valvular heart disease 10/17/2012    Orientation RESPIRATION BLADDER Height & Weight     Self  Normal Continent Weight: 146 lb 9.7 oz (66.5 kg) Height:  5\' 5"  (165.1 cm)  BEHAVIORAL SYMPTOMS/MOOD NEUROLOGICAL BOWEL NUTRITION STATUS      Continent Diet(Heart healthy, thin liquids)  AMBULATORY STATUS COMMUNICATION OF NEEDS Skin   Limited Assist Verbally Normal                       Personal Care Assistance Level of Assistance  Bathing, Feeding, Dressing Bathing Assistance: Limited assistance Feeding assistance: Independent Dressing Assistance: Limited assistance     Functional Limitations Info  Sight, Hearing, Speech Sight Info: Adequate Hearing Info:  Adequate Speech Info: Adequate    SPECIAL CARE FACTORS FREQUENCY  OT (By licensed OT), PT (By licensed PT)     PT Frequency: 2x OT Frequency: 2x            Contractures Contractures Info: Not present    Additional Factors Info  Code Status, Allergies Code Status Info: Full Code Allergies Info: No known allergies           Current Medications (08/10/2017):  This is the current hospital active medication list Current Facility-Administered Medications  Medication Dose Route Frequency Provider Last Rate Last Dose  . 0.9 %  sodium chloride infusion  250 mL Intravenous PRN Jani Gravel, MD      . acetaminophen (TYLENOL) tablet 650 mg  650 mg Oral Q6H PRN Jani Gravel, MD       Or  . acetaminophen (TYLENOL) suppository 650 mg  650 mg Rectal Q6H PRN Jani Gravel, MD      . aspirin EC tablet 81 mg  81 mg Oral Daily Jani Gravel, MD   81 mg at 08/10/17 1237  . atorvastatin (LIPITOR) tablet 20 mg  20 mg Oral q1800 Jani Gravel, MD      . feeding supplement (ENSURE ENLIVE) (ENSURE ENLIVE) liquid 237 mL  237 mL Oral BID BM Oretha Milch D, MD      . hydrALAZINE (APRESOLINE) injection 10 mg  10 mg Intravenous Q6H PRN Jani Gravel, MD      . lisinopril (PRINIVIL,ZESTRIL) tablet 40 mg  40 mg Oral  Daily Jani Gravel, MD   40 mg at 08/10/17 1237  . sodium chloride flush (NS) 0.9 % injection 3 mL  3 mL Intravenous Q12H Jani Gravel, MD   3 mL at 08/10/17 1238  . sodium chloride flush (NS) 0.9 % injection 3 mL  3 mL Intravenous PRN Jani Gravel, MD         Discharge Medications: Please see discharge summary for a list of discharge medications.  Relevant Imaging Results:  Relevant Lab Results:   Additional Information SSN: [id number]  Eileen Stanford, LCSW

## 2017-08-10 NOTE — Progress Notes (Signed)
  Echocardiogram 2D Echocardiogram was attempted but patient was in EEG. Will try again later.   Jennette Dubin 08/10/2017, 10:59 AM

## 2017-08-10 NOTE — Procedures (Signed)
HPI: 84 year old male with history of syncope.  TECHNICAL SUMMARY:  A multichannel referential and bipolar montage EEG using the standard international 10-20 system was performed on the patient described as mostly asleep, but was awakened at the end of the recording.  When most awake, there is a 7 Hz occipital dominant rhythm in the posterior head region.  5 Hz activity can be seen overlying in all head regions.  Low voltage is fast activity is distributed symmetrically and maximally in the frontal head regions.  ACTIVATION:  Stepwise photic stimulation and hyperventilation is not performed  EPILEPTIFORM ACTIVITY:  There were no spikes, sharp waves or paroxysmal activity.  SLEEP: Most of this recording is spent in stage I and stage II sleep architecture.  CARDIAC:  The EKG lead revealed a regular rhythm.    IMPRESSION:  This is an abnormal EEG demonstrating a mild diffuse slowing of electrocerebral activity.  This can be seen in a wide variety of encephalopathic state including those of a toxic, metabolic, or degenerative nature.  There were no focal, hemispheric, or lateralizing features.  No epileptiform activity was recorded.

## 2017-08-10 NOTE — Evaluation (Signed)
Physical Therapy Evaluation Patient Details Name: [name] MRN: [id number] DOB: 13-Aug-1929 Today's Date: 08/10/2017   History of Present Illness  84yo male who fell secondary to syncope and hit the back of his head. Imaging show acute/subacute infarct in the L posterior MCA region, scattered punctuate acute infarctions in the L temporal lobe and deep white matter. Imaging is negative for fracture. PMH exertional dyspnea, HTN, PAD, CVA, cardiac cath   Clinical Impression   Patient received in bed, very pleasant and willing to participate with PT session; he reports he lives alone, but does not feel comfortable with his current living situation at this point due to increasing confusion and falls, he is scared he will end up getting seriously hurt with a fall. Of note, RAM, sensation in LEs, and visual tracking appear normal, however bed mobility does provoke a bout of dizziness that resolved in static sitting position. He is able to perform functional bed mobility with Mod(I) and increased effort, and requires Min guard to MinA for functional transfers and gait with RW. Attempted standing marches without UE support/device, patient very unsteady and high fall risk. He was left in bed with all needs met, bed alarm activated this afternoon. He will continue to benefit from skilled PT services in the acute setting to include vestibular assessment, and will also benefit from ST-SNF to address functional deficits and to reduce fall risk moving forward.     Follow Up Recommendations SNF;Supervision/Assistance - 24 hour    Equipment Recommendations  Rolling walker with 5" wheels    Recommendations for Other Services       Precautions / Restrictions Precautions Precautions: Fall Restrictions Weight Bearing Restrictions: No      Mobility  Bed Mobility Overal bed mobility: Modified Independent             General bed mobility comments: increased effort   Transfers Overall transfer  level: Needs assistance Equipment used: Rolling walker (2 wheeled);None Transfers: Sit to/from Stand Sit to Stand: Min assist         General transfer comment: requires MinA and verbal cues for sequencing/safety with functional transfers; able to stabilize immediately with use of RW, but without devices is very unsteady on feet   Ambulation/Gait Ambulation/Gait assistance: Min guard Ambulation Distance (Feet): 30 Feet Assistive device: Rolling walker (2 wheeled) Gait Pattern/deviations: Step-through pattern;Decreased step length - right;Decreased step length - left;Decreased stride length;Drifts right/left     General Gait Details: requires close min guard for safety with RW due to generalized unsteadiness; attempted static matching at bedside without device, patient unsteady and unsafe without UE support   Stairs            Wheelchair Mobility    Modified Rankin (Stroke Patients Only)       Balance Overall balance assessment: Needs assistance;History of Falls Sitting-balance support: Feet supported;Bilateral upper extremity supported Sitting balance-Leahy Scale: Good     Standing balance support: No upper extremity supported;During functional activity Standing balance-Leahy Scale: Poor                               Pertinent Vitals/Pain Pain Assessment: No/denies pain    Home Living Family/patient expects to be discharged to:: Private residence Living Arrangements: Alone   Type of Home: Apartment Home Access: Level entry     Home Layout: One level Home Equipment: None      Prior Function Level of Independence: Independent  Comments: reports he has no assistive device, no assistive equipment; he is very scared to return home as he does not feel he can properly care for himself at this point      Hand Dominance        Extremity/Trunk Assessment   Upper Extremity Assessment Upper Extremity Assessment: Defer to OT evaluation     Lower Extremity Assessment Lower Extremity Assessment: Generalized weakness    Cervical / Trunk Assessment Cervical / Trunk Assessment: Kyphotic  Communication      Cognition Arousal/Alertness: Awake/alert Behavior During Therapy: WFL for tasks assessed/performed Overall Cognitive Status: Within Functional Limits for tasks assessed                                 General Comments: patient very pleasant, A&O x4, but reports recent and progressive confusion; unable to perform cognitive tasks such as spelling "world" backwards, counting backwards from 100 by serial 7s, or recall set of 3 words specifically mentioned earlier in conversation       General Comments General comments (skin integrity, edema, etc.): dizzy  with intial bed mobility, may benefit from vestibular evaulation especially due to history of hitting his head in this most recent fall; visual tracking WNL and does not provoke dizziness; RAM appears normal; light touch sensation to B LEs appears normal     Exercises     Assessment/Plan    PT Assessment Patient needs continued PT services  PT Problem List Decreased strength;Decreased mobility;Decreased safety awareness;Decreased coordination;Decreased balance       PT Treatment Interventions DME instruction;Therapeutic activities;Gait training;Therapeutic exercise;Patient/family education;Stair training;Balance training;Functional mobility training;Neuromuscular re-education    PT Goals (Current goals can be found in the Care Plan section)  Acute Rehab PT Goals Patient Stated Goal: to potentially move into ILF or ALF, he does not feel safe living independently  PT Goal Formulation: With patient Time For Goal Achievement: 08/24/17 Potential to Achieve Goals: Fair    Frequency Min 2X/week   Barriers to discharge        Co-evaluation               AM-PAC PT "6 Clicks" Daily Activity  Outcome Measure Difficulty turning over in bed  (including adjusting bedclothes, sheets and blankets)?: Unable Difficulty moving from lying on back to sitting on the side of the bed? : Unable Difficulty sitting down on and standing up from a chair with arms (e.g., wheelchair, bedside commode, etc,.)?: Unable Help needed moving to and from a bed to chair (including a wheelchair)?: A Little Help needed walking in hospital room?: A Little Help needed climbing 3-5 steps with a railing? : A Little 6 Click Score: 12    End of Session Equipment Utilized During Treatment: Gait belt Activity Tolerance: Patient tolerated treatment well Patient left: in bed;with bed alarm set;with call bell/phone within reach   PT Visit Diagnosis: Unsteadiness on feet (R26.81);Muscle weakness (generalized) (M62.81);Other abnormalities of gait and mobility (R26.89);History of falling (Z91.81)    Time: 9242-6834 PT Time Calculation (min) (ACUTE ONLY): 23 min   Charges:   PT Evaluation $PT Eval Moderate Complexity: 1 Mod PT Treatments $Gait Training: 8-22 mins   PT G Codes:        Deniece Ree PT, DPT, CBIS  Supplemental Physical Therapist [name]   Pager [phone]
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
## 2017-08-10 NOTE — Progress Notes (Signed)
Initial Nutrition Assessment  DOCUMENTATION CODES:  Not applicable  INTERVENTION:  Ensure Enlive po BID, each supplement provides 350 kcal and 20 grams of protein  NUTRITION DIAGNOSIS:  Inadequate oral intake related to social / environmental circumstances(Decline in cognition and lack of support at home) as evidenced by loss of 30 lbs in 2 years, 10 of which were in last 6 months  GOAL:  Patient will meet greater than or equal to 90% of their needs  MONITOR:  PO intake, Supplement acceptance, Diet advancement, Labs, Weight trends  REASON FOR ASSESSMENT:  Malnutrition Screening Tool    ASSESSMENT:  84 y/o male HTN, PAD, CVA, MVR. Presents [place] Hit back of head and now is confused. Admitted for further workup.   Pt is a poor historian. RD asked how he has been eating and he responds "I havent". When asked why this was, all he said was "becaue I am confused". He cites instances of forgetting how to do basic tasks. He describes a "machine" that he uses to heat his meals that he no longer knows how to operate. This sounds to be a microwave. He sounds to rely on frozen meals, which he reheats. Because he does not know how to use the machine, he hasn't been eating as well. He says his confusion has been going on for "8 days"  Denies following any diet or taking any supplements at home. No n/v/c/d  He endorses weight loss. He doesn't feel his bones are showing more, rather, just his pants are slightly looser. He says his UBW is 200 lbs. He claims to have weighed this amount recently. Per chart, he has not weighed this much in atleast 7 years. He appears to have maintained a UBW in the 170s from 2014-2017. Over the past couple years, he has lost 30 lbs. He has lost 10 lbs since last June.   Pt notes he has no family, friends or any support at home. He says he has been "a bit of a loner for quite a few years". Social work has been consulted to help pt  identify resources that could be used at home for support.  At this time, patient reports a good appetite. He says he ate 100% of his breakfast. He was agreeable to supplements while admitted.  He told to RD that he is very depressed about his current state and decrease in independence and says "I would like to just go". RD offered encouragement  Physical Exam: Mild fat wasting of triceps. Mild muscle wasting of interosseous. Overall insufficient ares to dx mal.   Labs: Albumin: 3.1, K:3.4 Meds: Ensure Enlive BID  Recent Labs  Lab 08/09/17 2032 08/09/17 2043 08/10/17 0548  NA 141 143 143  K 3.9 3.7 3.4*  CL 103 101 107  CO2 27  --  26  BUN 9 11 8   CREATININE 0.92 0.80 0.88  CALCIUM 9.6  --  8.5*  GLUCOSE 118* 114* 99    NUTRITION - FOCUSED PHYSICAL EXAM:   Most Recent Value  Orbital Region  No depletion  Upper Arm Region  Mild depletion  Thoracic and Lumbar Region  No depletion  Buccal Region  No depletion  Temple Region  No depletion  Clavicle Bone Region  No depletion  Clavicle and Acromion Bone Region  No depletion  Scapular Bone Region  No depletion  Dorsal Hand  Mild depletion  Patellar Region  No depletion  Anterior Thigh Region  No depletion  Posterior Calf Region  No depletion       Diet Order:  Diet Heart Room service appropriate? Yes; Fluid consistency: Thin  EDUCATION NEEDS:  No education needs have been identified at this time  Skin:  Skin Assessment: Reviewed RN Assessment  Last BM:  Unknown  Height:  Ht Readings from Last 1 Encounters:  08/09/17 5\' 5"  (1.651 m)   Weight:  Wt Readings from Last 1 Encounters:  08/09/17 146 lb 9.7 oz (66.5 kg)   Wt Readings from Last 10 Encounters:  08/09/17 146 lb 9.7 oz (66.5 kg)  11/22/16 156 lb 9.6 oz (71 kg)  11/09/16 160 lb (72.6 kg)  10/10/16 157 lb (71.2 kg)  10/10/16 157 lb 9.6 oz (71.5 kg)  05/11/16 165 lb 12.8 oz (75.2 kg)  02/14/16 170 lb (77.1 kg)  02/12/16 173 lb (78.5 kg)  11/11/15 171 lb  3.2 oz (77.7 kg)  07/17/15 174 lb (78.9 kg)   Ideal Body Weight:  59.1 kg  BMI:  Body mass index is 24.4 kg/m.  Estimated Nutritional Needs:  Kcal:  1650-1850 (25-28 kcal/kg bw) Protein:  67-80g Pro (1-1.2g/kg bw) Fluid:  >1.7 L (25 ml/kg bw)  Burtis Junes RD, LDN, CNSC Clinical Nutrition Pager: [phone] 08/10/2017 3:41 PM

## 2017-08-11 ENCOUNTER — Observation Stay (HOSPITAL_COMMUNITY): Payer: Medicare Other

## 2017-08-11 DIAGNOSIS — R8290 Unspecified abnormal findings in urine: Secondary | ICD-10-CM | POA: Diagnosis not present

## 2017-08-11 DIAGNOSIS — I10 Essential (primary) hypertension: Secondary | ICD-10-CM | POA: Diagnosis not present

## 2017-08-11 DIAGNOSIS — S0101XA Laceration without foreign body of scalp, initial encounter: Secondary | ICD-10-CM | POA: Diagnosis not present

## 2017-08-11 DIAGNOSIS — I739 Peripheral vascular disease, unspecified: Secondary | ICD-10-CM | POA: Diagnosis not present

## 2017-08-11 DIAGNOSIS — R55 Syncope and collapse: Secondary | ICD-10-CM | POA: Diagnosis not present

## 2017-08-11 LAB — GLUCOSE, CAPILLARY: Glucose-Capillary: 88 mg/dL (ref 65–99)

## 2017-08-11 LAB — HEMOGLOBIN A1C
Hgb A1c MFr Bld: 5.2 % (ref 4.8–5.6)
Mean Plasma Glucose: 103 mg/dL

## 2017-08-11 NOTE — Clinical Social Work Note (Signed)
CSW spoke with pt's friend via telephone. Per pt's friend pt has a sister who lives in [place] but her and pt do not speak and she is not involved in pt's life. Friend did not have pt's sister's contact to provide to CSW. CSW has no way to locate or contact pt's sister. Pt is only alert to self. CSW utilized pt's friend as closest associated Scientist, research (medical). Pt's friend understands pt's need for SNF and says he will be applying pt for Medicaid as he suspects pt will need long term care. CSW Surveyor, quantity has agreed to 5 day LOG to get pt to SNF. CSW has begun the search for facility that will take pt with a LOG. Pt's friend states he does not know about any facilities is agreeable to whatever facility will take pt with the LOG.   [place], [place]

## 2017-08-11 NOTE — Progress Notes (Signed)
Physical Therapy Treatment Patient Details Name: [name] MRN: [id number] DOB: 12/12/1929 Today's Date: 08/11/2017    History of Present Illness 84yo male who fell secondary to syncope and hit the back of his head. Imaging show acute/subacute infarct in the L posterior MCA region, scattered punctuate acute infarctions in the L temporal lobe and deep white matter. Imaging is negative for fracture. PMH exertional dyspnea, HTN, PAD, CVA, cardiac cath     PT Comments    Pt seen for vestibular assessment. Pt found to have L posterior canal BPPV (see below for details.). Pt treated with Semont maneuver. Plan to see pt tomorrow for another treatment to allow for decreased dizziness/room spinning upon standing and to ultimately decrease falls risk.    Follow Up Recommendations  SNF;Supervision/Assistance - 24 hour     Equipment Recommendations  Rolling walker with 5" wheels    Recommendations for Other Services       Precautions / Restrictions Precautions Precautions: Fall Precaution Comments: L posterior canal BPPV Restrictions Weight Bearing Restrictions: No    Mobility  Bed Mobility Overal bed mobility: Needs Assistance(due to completing vesitubar assessment)                Transfers Overall transfer level: Needs assistance Equipment used: Rolling walker (2 wheeled) Transfers: Sit to/from Stand Sit to Stand: Min guard         General transfer comment: v/c's to push up from bed, increased time  Ambulation/Gait Ambulation/Gait assistance: Min guard Ambulation Distance (Feet): 10 Feet Assistive device: Rolling walker (2 wheeled) Gait Pattern/deviations: Step-through pattern;Decreased step length - right;Decreased step length - left;Decreased stride length;Drifts right/left Gait velocity: slow   General Gait Details: ambulated around bed to chair, good walker management   Stairs            Wheelchair Mobility    Modified Rankin (Stroke Patients  Only)       Balance Overall balance assessment: Needs assistance Sitting-balance support: Feet supported Sitting balance-Leahy Scale: Good     Standing balance support: Bilateral upper extremity supported Standing balance-Leahy Scale: Poor Standing balance comment: requires RW                            Cognition Arousal/Alertness: Awake/alert Behavior During Therapy: WFL for tasks assessed/performed Overall Cognitive Status: Within Functional Limits for tasks assessed                                        Exercises      General Comments General comments (skin integrity, edema, etc.): Vestibular Assessment: Pt with + Nystagmus and report of room spinning with L dix hallpike, + nystagmus when returning to sitting s/p R and L dix hallpike. Pt with found to have L posterior canal BPPV. Pt treated with Semont maneuver      Pertinent Vitals/Pain Pain Assessment: No/denies pain    Home Living                      Prior Function            PT Goals (current goals can now be found in the care plan section) Progress towards PT goals: Progressing toward goals    Frequency    Min 3X/week      PT Plan Current plan remains appropriate    Co-evaluation  AM-PAC PT "6 Clicks" Daily Activity  Outcome Measure  Difficulty turning over in bed (including adjusting bedclothes, sheets and blankets)?: Unable Difficulty moving from lying on back to sitting on the side of the bed? : Unable Difficulty sitting down on and standing up from a chair with arms (e.g., wheelchair, bedside commode, etc,.)?: Unable Help needed moving to and from a bed to chair (including a wheelchair)?: A Little Help needed walking in hospital room?: A Little Help needed climbing 3-5 steps with a railing? : A Little 6 Click Score: 12    End of Session Equipment Utilized During Treatment: Gait belt Activity Tolerance: Patient tolerated treatment  well Patient left: in chair;with call bell/phone within reach;with chair alarm set Nurse Communication: Mobility status PT Visit Diagnosis: Unsteadiness on feet (R26.81);Muscle weakness (generalized) (M62.81);Other abnormalities of gait and mobility (R26.89);History of falling (Z91.81)     Time: 7915-0569 PT Time Calculation (min) (ACUTE ONLY): 26 min  Charges:  $Gait Training: 8-22 mins $Canalith Rep Proc: 8-22 mins                    G Codes:       [name], PT, DPT Pager #: [phone] Office #: [phone]    [place] 08/11/2017, 1:50 PM

## 2017-08-11 NOTE — Progress Notes (Signed)
VASCULAR LAB PRELIMINARY  ARTERIAL  ABI completed:ABIs indicate a moderate reduction in arterial flow, but maybe falsely elevated secondary to calcified vessels.  Great toe pressures are abnormal.     RIGHT    LEFT    PRESSURE WAVEFORM  PRESSURE WAVEFORM  BRACHIAL 126 T BRACHIAL 126 T  DP   DP    AT 86 DM AT 89 DM  PT   PT    PER 79 DM PER    GREAT TOE 26 NA GREAT TOE  NA    RIGHT LEFT  ABI 0.68 0.71     Telina Kleckley, RVT 08/11/2017, 5:38 PM

## 2017-08-11 NOTE — Progress Notes (Signed)
PROGRESS NOTE  [name] [id number]:[id number] DOB: 02/29/1930 DOA: 08/09/2017 PCP: Patient, [name]  HPI/Recap of past 24 hours:  [name] is a 84 y.o. year old male with medical history significant for hypertension, PAD with severe SFA d/s bilaterally on ABI ( 2014), pulmonary hypertension (RVSP 69) who presented on 08/09/2017 with witnessed fall and was found to have witnessed syncope of unclear etiology.    Interval History  Doing well this morning.  No acute complaints   Assessment/Plan: Principal Problem:   Syncope Active Problems:   Claudication of calf muscles (HCC)   Essential hypertension   PAD (peripheral artery disease) (HCC)   Abnormal finding on urinalysis   Right inguinal hernia  # Witnessed Syncope, likely due to unsteady gait related to possible dementia Infectious etiology ruled out. Metabolic workup unrevealing CT head with subacute CVA but no acute abnormalities Orthostatic vitals negative No arrhythmias on telemetry TTE no WMA or acute valvular changes. Preserved EF -some concern for dementia/MCI could contribute to poor gait/steadiness -continue to monitor on telemetry  #Severe PAD with severe SFA disease, stable ABI (2014) : occlusive proximal right SFA, 70-99%SFA Patient currently asymptomatic Unable to palpate pedal pulses, vascular surgery able to obtain distal pulses with Doppler -doppler pulses -No need for acute intervention per vascular surgery recommendations  #WBC on UA Patient asymptomatic UA without bacteria, no leukocytes, no nitrites Less likely UTI -Urine culture already sent on admission -Hold off on further antibiotic therapy and continue to monitor  #Hypertension, improving -Continue lisinopril 40 mg -IV hydralazine as needed  #Scalp laceration Trauma related to recent fall -Staples in place -Continue to monitor  #Right inguinal hernia, reducible, stable Wearing hernia belt No current pain -continue to  monitor  #Concern for cognitive impairment, possible MCI or Dementia Reports inability to pay bills or drive any longer Bouts of confusion during interview though overall oriented Scared to return home as he doesn't think he can take care of himself During PT evaluation difficulty performing cognitive tasks -Would benefit from skilled nursing facility, currently recommended spell   Code Status: Full   Family Communication: No family at bedside  Disposition Plan: Medically stable, awaiting dispo for potential SNF   Consultants:  Vascular surgery  Procedures:  TTE 08/10/17:  EF 67-34%, grade 1 diastolic dysfunction, LVH, trivial mitral valve regurgitation  Antimicrobials:  none  Cultures:  none  Telemetry:yes  DVT prophylaxis: SCDs   Objective: Vitals:   08/11/17 0400 08/11/17 0500 08/11/17 0751 08/11/17 1208  BP: (!) 158/78  (!) 154/74 139/62  Pulse: 71  73 75  Resp: 18  18 18   Temp: 98.5 F (36.9 C)  98.9 F (37.2 C) 98.6 F (37 C)  TempSrc: Oral  Oral Oral  SpO2: 100%  100% 100%  Weight:  67.7 kg (149 lb 4 oz)    Height:        Intake/Output Summary (Last 24 hours) at 08/11/2017 1449 Last data filed at 08/11/2017 1100 Gross per 24 hour  Intake 480 ml  Output 525 ml  Net -45 ml   Filed Weights   08/09/17 2230 08/11/17 0500  Weight: 66.5 kg (146 lb 9.7 oz) 67.7 kg (149 lb 4 oz)    Exam:  General: comfortably lying in bed Eyes: EOMI, anicteric ENT: Oral Mucosa clear and moist Cardiovascular: regular rate and rhythm, no murmurs, rubs or gallops, no edema Respiratory: Normal respiratory effort on room air, lungs clear to auscultation bilaterally Abdomen: soft, non-distended, non-tender, normal bowel  sounds, support belt in place, inguinal hernia not palpable on exam Skin: stapled laceration to posterior scalp Musculoskeletal:Good ROM, no contractures. Normal muscle tone Neurologic: Grossly no focal neuro deficit.Alert to person, context and place.  Unsure of month, year. Psychiatric:Appropriate affect, and mood  Data Reviewed: CBC: Recent Labs  Lab 08/09/17 2032 08/09/17 2043 08/10/17 0548  WBC 6.3  --  5.8  NEUTROABS 4.7  --   --   HGB 13.7 14.3 12.2*  HCT 42.0 42.0 38.5*  MCV 88.4  --  88.7  PLT 384  --  790   Basic Metabolic Panel: Recent Labs  Lab 08/09/17 2032 08/09/17 2043 08/10/17 0548  NA 141 143 143  K 3.9 3.7 3.4*  CL 103 101 107  CO2 27  --  26  GLUCOSE 118* 114* 99  BUN 9 11 8   CREATININE 0.92 0.80 0.88  CALCIUM 9.6  --  8.5*   GFR: Estimated Creatinine Clearance: 53.4 mL/min (by C-G formula based on SCr of 0.88 mg/dL). Liver Function Tests: Recent Labs  Lab 08/09/17 2032 08/10/17 0548  AST 21 12*  ALT 9* 8*  ALKPHOS 95 82  BILITOT 1.4* 0.6  PROT 5.9* 5.3*  ALBUMIN 3.7 3.1*   No results for input(s): LIPASE, AMYLASE in the last 168 hours. Recent Labs  Lab 08/09/17 2032  AMMONIA 26   Coagulation Profile: Recent Labs  Lab 08/09/17 2032  INR 1.07   Cardiac Enzymes: Recent Labs  Lab 08/10/17 0142 08/10/17 0548 08/10/17 1307  TROPONINI <0.03 <0.03 <0.03   BNP (last 3 results) No results for input(s): PROBNP in the last 8760 hours. HbA1C: Recent Labs    08/10/17 0142  HGBA1C 5.2   CBG: Recent Labs  Lab 08/09/17 2053 08/11/17 0633  GLUCAP 116* 88   Lipid Profile: Recent Labs    08/10/17 0142  CHOL 101  HDL 33*  LDLCALC 53  TRIG 74  CHOLHDL 3.1   Thyroid Function Tests: Recent Labs    08/10/17 0142  TSH 0.903   Anemia Panel: No results for input(s): VITAMINB12, FOLATE, FERRITIN, TIBC, IRON, RETICCTPCT in the last 72 hours. Urine analysis:    Component Value Date/Time   COLORURINE YELLOW 08/09/2017 1856   APPEARANCEUR CLEAR 08/09/2017 1856   LABSPEC 1.012 08/09/2017 1856   PHURINE 6.0 08/09/2017 1856   GLUCOSEU NEGATIVE 08/09/2017 1856   HGBUR NEGATIVE 08/09/2017 1856   BILIRUBINUR NEGATIVE 08/09/2017 1856   BILIRUBINUR small 10/17/2012 1356    KETONESUR NEGATIVE 08/09/2017 1856   PROTEINUR 30 (A) 08/09/2017 1856   UROBILINOGEN 4.0 10/17/2012 1356   UROBILINOGEN 0.2 07/04/2010 0805   NITRITE NEGATIVE 08/09/2017 1856   LEUKOCYTESUR NEGATIVE 08/09/2017 1856   Sepsis Labs: @LABRCNTIP (procalcitonin:4,lacticidven:4)  )No results found for this or any previous visit (from the past 240 hour(s)).    Studies: No results found.  Scheduled Meds: . aspirin EC  81 mg Oral Daily  . atorvastatin  20 mg Oral q1800  . feeding supplement (ENSURE ENLIVE)  237 mL Oral BID BM  . lisinopril  40 mg Oral Daily  . sodium chloride flush  3 mL Intravenous Q12H    Continuous Infusions: . sodium chloride       LOS: 0 days     Desiree Hane, MD [facility] Pager [phone]  If 7PM-7AM, please contact night-coverage [email or web address] Password [facility] 08/11/2017, 2:49 PM

## 2017-08-11 NOTE — Progress Notes (Signed)
   08/11/17 1500  Clinical Encounter Type  Visited With Patient (Friend)  Visit Type Initial  Referral From Nurse  Consult/Referral To Chaplain  Spiritual Encounters  Spiritual Needs Emotional;Prayer  Stress Factors  Patient Stress Factors Major life changes;Financial concerns   Following up on a SCC for prayer.  Patient was sitting up in the chair with his friend present.  Stated that up until a few days ago he was pretty independent but now says he feels worse than when he came in and is very concerned about going home and how he will make it, worried about paying his bills.  Said, might be easier if I just checked out.  I asked what he meant and he said, if I expired.  In no way do I think he will harm himself, I just believe is worried about moving forward and what that will be like.  He is not sure he can go home soon.  His friend was trying to help share some hope for him.  We all prayed together.  Will follow and support as needed. Chaplain Katherene Ponto

## 2017-08-12 DIAGNOSIS — F0150 Vascular dementia without behavioral disturbance: Secondary | ICD-10-CM | POA: Diagnosis present

## 2017-08-12 DIAGNOSIS — I6523 Occlusion and stenosis of bilateral carotid arteries: Secondary | ICD-10-CM | POA: Diagnosis present

## 2017-08-12 DIAGNOSIS — R2681 Unsteadiness on feet: Secondary | ICD-10-CM | POA: Diagnosis present

## 2017-08-12 DIAGNOSIS — I340 Nonrheumatic mitral (valve) insufficiency: Secondary | ICD-10-CM | POA: Diagnosis present

## 2017-08-12 DIAGNOSIS — I10 Essential (primary) hypertension: Secondary | ICD-10-CM | POA: Diagnosis present

## 2017-08-12 DIAGNOSIS — R8290 Unspecified abnormal findings in urine: Secondary | ICD-10-CM | POA: Diagnosis not present

## 2017-08-12 DIAGNOSIS — S0101XA Laceration without foreign body of scalp, initial encounter: Secondary | ICD-10-CM | POA: Diagnosis present

## 2017-08-12 DIAGNOSIS — Z79899 Other long term (current) drug therapy: Secondary | ICD-10-CM | POA: Diagnosis not present

## 2017-08-12 DIAGNOSIS — R55 Syncope and collapse: Secondary | ICD-10-CM | POA: Diagnosis present

## 2017-08-12 DIAGNOSIS — R402413 Glasgow coma scale score 13-15, at hospital admission: Secondary | ICD-10-CM | POA: Diagnosis present

## 2017-08-12 DIAGNOSIS — K4090 Unilateral inguinal hernia, without obstruction or gangrene, not specified as recurrent: Secondary | ICD-10-CM | POA: Diagnosis present

## 2017-08-12 DIAGNOSIS — I639 Cerebral infarction, unspecified: Secondary | ICD-10-CM | POA: Diagnosis present

## 2017-08-12 DIAGNOSIS — X58XXXA Exposure to other specified factors, initial encounter: Secondary | ICD-10-CM | POA: Diagnosis not present

## 2017-08-12 DIAGNOSIS — J449 Chronic obstructive pulmonary disease, unspecified: Secondary | ICD-10-CM | POA: Diagnosis present

## 2017-08-12 DIAGNOSIS — W19XXXA Unspecified fall, initial encounter: Secondary | ICD-10-CM | POA: Diagnosis not present

## 2017-08-12 DIAGNOSIS — Z87891 Personal history of nicotine dependence: Secondary | ICD-10-CM | POA: Diagnosis not present

## 2017-08-12 DIAGNOSIS — I63512 Cerebral infarction due to unspecified occlusion or stenosis of left middle cerebral artery: Secondary | ICD-10-CM | POA: Diagnosis present

## 2017-08-12 DIAGNOSIS — I739 Peripheral vascular disease, unspecified: Secondary | ICD-10-CM | POA: Diagnosis present

## 2017-08-12 DIAGNOSIS — R29702 NIHSS score 2: Secondary | ICD-10-CM | POA: Diagnosis present

## 2017-08-12 DIAGNOSIS — W1830XA Fall on same level, unspecified, initial encounter: Secondary | ICD-10-CM | POA: Diagnosis not present

## 2017-08-12 DIAGNOSIS — H8110 Benign paroxysmal vertigo, unspecified ear: Secondary | ICD-10-CM | POA: Diagnosis present

## 2017-08-12 DIAGNOSIS — I272 Pulmonary hypertension, unspecified: Secondary | ICD-10-CM | POA: Diagnosis present

## 2017-08-12 DIAGNOSIS — N390 Urinary tract infection, site not specified: Secondary | ICD-10-CM | POA: Diagnosis present

## 2017-08-12 DIAGNOSIS — Z23 Encounter for immunization: Secondary | ICD-10-CM | POA: Diagnosis present

## 2017-08-12 LAB — GLUCOSE, CAPILLARY: GLUCOSE-CAPILLARY: 82 mg/dL (ref 65–99)

## 2017-08-12 NOTE — Progress Notes (Signed)
PROGRESS NOTE  [name] AST:[id number] DOB: 1929-12-21 DOA: 08/09/2017 PCP: Patient, [name]  HPI/Recap of past 24 hours:  [name] is a 84 y.o. year old male with medical history significant for hypertension, PAD with severe SFA d/s bilaterally on ABI ( 2014), pulmonary hypertension (RVSP 69) who presented on 08/09/2017 with witnessed fall and was found to have witnessed syncope of unclear etiology.    Interval History  Continues to be doing very well this morning.  Tolerated breakfast.  Denies any abdominal pain or chest pain.  No acute complaints   Assessment/Plan: Principal Problem:   Syncope Active Problems:   Claudication of calf muscles (HCC)   Essential hypertension   PAD (peripheral artery disease) (HCC)   Abnormal finding on urinalysis   Right inguinal hernia   Syncope and collapse   #Concern for cognitive impairment, possible MCI or Dementia Reports inability to pay bills or drive any longer Bouts of confusion during interview though overall oriented x4 Scared to return home as he doesn't think he can take care of himself During PT evaluation difficulty performing cognitive tasks -Would benefit from skilled nursing facility, currently awaiting placement as patient is medically stable  # Witnessed Syncope, likely due to unsteady gait related to possible dementia -some concern for dementia/MCI could contribute to poor gait/steadiness Infectious etiology ruled out. Metabolic workup unrevealing CT head with subacute CVA but no acute abnormalities Orthostatic vitals negative No arrhythmias on telemetry TTE no WMA or acute valvular changes. Preserved EF -continue to monitor on telemetry -Awaiting placement at SNF  #Severe PAD with severe SFA disease, stable ABI (2014) : occlusive proximal right SFA, 70-99%SFA Patient currently asymptomatic Unable to palpate pedal pulses, vascular surgery able to obtain distal pulses with Doppler -No need for acute  intervention per vascular surgery recommendations, ABIs ordered for baseline  #WBC on UA Patient asymptomatic UA without bacteria, no leukocytes, no nitrites Less likely UTI -Urine culture already sent on admission -Hold off on further antibiotic therapy and continue to monitor  #Hypertension, at goal -Continue lisinopril 40 mg -IV hydralazine as needed  #Scalp laceration Trauma related to recent fall -Staples in place -Continue to monitor  #Right inguinal hernia, reducible, stable Wearing hernia belt No current pain -continue to monitor  Code Status: Full   Family Communication: No family at bedside  Disposition Plan: Medically stable, awaiting dispo for potential SNF   Consultants:  Vascular surgery  Procedures:  TTE 08/10/17:  EF 98-92%, grade 1 diastolic dysfunction, LVH, trivial mitral valve regurgitation  Antimicrobials:  none  Cultures:  none  Telemetry:yes  DVT prophylaxis: SCDs   Objective: Vitals:   08/12/17 0010 08/12/17 0436 08/12/17 0449 08/12/17 0741  BP: 131/67 (!) 160/75  132/67  Pulse: 65 63  67  Resp: 18 18  18   Temp: 99.1 F (37.3 C) 99.3 F (37.4 C)  99 F (37.2 C)  TempSrc: Oral Oral  Oral  SpO2: 99% 100%  100%  Weight:   68.8 kg (151 lb 10.8 oz)   Height:        Intake/Output Summary (Last 24 hours) at 08/12/2017 1211 Last data filed at 08/12/2017 0700 Gross per 24 hour  Intake 480 ml  Output -  Net 480 ml   Filed Weights   08/09/17 2230 08/11/17 0500 08/12/17 0449  Weight: 66.5 kg (146 lb 9.7 oz) 67.7 kg (149 lb 4 oz) 68.8 kg (151 lb 10.8 oz)    Exam:  General: comfortably lying in bed  Eyes: EOMI, anicteric ENT: Oral Mucosa clear and moist Cardiovascular: regular rate and rhythm, no murmurs, rubs or gallops, no edema Respiratory: Normal respiratory effort on room air, lungs clear to auscultation bilaterally Abdomen: soft, non-distended, non-tender, normal bowel sounds, support belt in place,  Skin: stapled  laceration to posterior scalp Musculoskeletal:Good ROM, no contractures. Normal muscle tone Neurologic: Grossly no focal neuro deficit.Alert to person, context and place. Unsure of month, year. Psychiatric:Appropriate affect, and mood  Data Reviewed: CBC: Recent Labs  Lab 08/09/17 2032 08/09/17 2043 08/10/17 0548  WBC 6.3  --  5.8  NEUTROABS 4.7  --   --   HGB 13.7 14.3 12.2*  HCT 42.0 42.0 38.5*  MCV 88.4  --  88.7  PLT 384  --  948   Basic Metabolic Panel: Recent Labs  Lab 08/09/17 2032 08/09/17 2043 08/10/17 0548  NA 141 143 143  K 3.9 3.7 3.4*  CL 103 101 107  CO2 27  --  26  GLUCOSE 118* 114* 99  BUN 9 11 8   CREATININE 0.92 0.80 0.88  CALCIUM 9.6  --  8.5*   GFR: Estimated Creatinine Clearance: 53.4 mL/min (by C-G formula based on SCr of 0.88 mg/dL). Liver Function Tests: Recent Labs  Lab 08/09/17 2032 08/10/17 0548  AST 21 12*  ALT 9* 8*  ALKPHOS 95 82  BILITOT 1.4* 0.6  PROT 5.9* 5.3*  ALBUMIN 3.7 3.1*   No results for input(s): LIPASE, AMYLASE in the last 168 hours. Recent Labs  Lab 08/09/17 2032  AMMONIA 26   Coagulation Profile: Recent Labs  Lab 08/09/17 2032  INR 1.07   Cardiac Enzymes: Recent Labs  Lab 08/10/17 0142 08/10/17 0548 08/10/17 1307  TROPONINI <0.03 <0.03 <0.03   BNP (last 3 results) No results for input(s): PROBNP in the last 8760 hours. HbA1C: Recent Labs    08/10/17 0142  HGBA1C 5.2   CBG: Recent Labs  Lab 08/09/17 2053 08/11/17 0633 08/12/17 0645  GLUCAP 116* 88 82   Lipid Profile: Recent Labs    08/10/17 0142  CHOL 101  HDL 33*  LDLCALC 53  TRIG 74  CHOLHDL 3.1   Thyroid Function Tests: Recent Labs    08/10/17 0142  TSH 0.903   Anemia Panel: No results for input(s): VITAMINB12, FOLATE, FERRITIN, TIBC, IRON, RETICCTPCT in the last 72 hours. Urine analysis:    Component Value Date/Time   COLORURINE YELLOW 08/09/2017 1856   APPEARANCEUR CLEAR 08/09/2017 1856   LABSPEC 1.012 08/09/2017  1856   PHURINE 6.0 08/09/2017 1856   GLUCOSEU NEGATIVE 08/09/2017 1856   HGBUR NEGATIVE 08/09/2017 1856   BILIRUBINUR NEGATIVE 08/09/2017 1856   BILIRUBINUR small 10/17/2012 1356   KETONESUR NEGATIVE 08/09/2017 1856   PROTEINUR 30 (A) 08/09/2017 1856   UROBILINOGEN 4.0 10/17/2012 1356   UROBILINOGEN 0.2 07/04/2010 0805   NITRITE NEGATIVE 08/09/2017 1856   LEUKOCYTESUR NEGATIVE 08/09/2017 1856   Sepsis Labs: @LABRCNTIP (procalcitonin:4,lacticidven:4)  )No results found for this or any previous visit (from the past 240 hour(s)).    Studies: No results found.  Scheduled Meds: . aspirin EC  81 mg Oral Daily  . atorvastatin  20 mg Oral q1800  . feeding supplement (ENSURE ENLIVE)  237 mL Oral BID BM  . lisinopril  40 mg Oral Daily  . sodium chloride flush  3 mL Intravenous Q12H    Continuous Infusions: . sodium chloride       LOS: 0 days     Desiree Hane, MD [facility] Pager  [phone]  If 7PM-7AM, please contact night-coverage [email or web address] Password [email or web address] 08/12/2017, 12:11 PM

## 2017-08-12 NOTE — Plan of Care (Signed)
  Clinical Measurements: Ability to maintain clinical measurements within normal limits will improve 08/12/2017 2103 - Progressing by Mikey College, RN   Clinical Measurements: Will remain free from infection 08/12/2017 2103 - Progressing by Mikey College, RN   Clinical Measurements: Diagnostic test results will improve 08/12/2017 2103 - Progressing by Mikey College, RN   Clinical Measurements: Respiratory complications will improve 08/12/2017 2103 - Progressing by Mikey College, RN

## 2017-08-12 NOTE — Progress Notes (Signed)
Physical Therapy Treatment Patient Details Name: [name] MRN: [id number] DOB: 1930/03/23 Today's Date: 08/12/2017    History of Present Illness 84yo male who fell secondary to syncope and hit the back of his head. Imaging show acute/subacute infarct in the L posterior MCA region, scattered punctuate acute infarctions in the L temporal lobe and deep white matter. Imaging is negative for fracture. PMH exertional dyspnea, HTN, PAD, CVA, cardiac cath     PT Comments    Pt seen for another treatment for L posterior canal BPPV. Pt symptoms much improved. Per RN and RN tech pt with no report of room spinning or dizziness upon standing. Pt able to amb 120' today with R HHA. Pt's balance much improved as well as level of alertness. Pt con't to require treatment for L posterior canal BPPV and will benefit from vestibular therapy at [facility]. Acute PT to con't to follow.   Follow Up Recommendations  SNF;Supervision/Assistance - 24 hour     Equipment Recommendations  Rolling walker with 5" wheels    Recommendations for Other Services       Precautions / Restrictions Precautions Precautions: Fall Precaution Comments: L posterior canal BPPV Restrictions Weight Bearing Restrictions: No    Mobility  Bed Mobility Overal bed mobility: Needs Assistance(due to completing vesitubar assessment)             General bed mobility comments: assisted pt into L and R sidelying for posterior canal BPPV treatement  Transfers Overall transfer level: Needs assistance Equipment used: None Transfers: Sit to/from [facility] Sit to Stand: Min assist Stand pivot transfers: Min assist       General transfer comment: increased time, v/cs to push up from arm rests  Ambulation/Gait Ambulation/Gait assistance: Min assist Ambulation Distance (Feet): 120 Feet Assistive device: Rolling walker (2 wheeled);1 person hand held assist Gait Pattern/deviations: Step-through pattern;Decreased  stride length;Drifts right/left;Trunk flexed Gait velocity: slow Gait velocity interpretation: Below normal speed for age/gender General Gait Details: pt denied any dizziness or sensation of room spinning. Pt with no episode of LOB however mildly unsteady in general requiring HHA    Stairs            Wheelchair Mobility    Modified Rankin (Stroke Patients Only)       Balance Overall balance assessment: Needs assistance Sitting-balance support: Feet supported Sitting balance-Leahy Scale: Good     Standing balance support: Single extremity supported Standing balance-Leahy Scale: Fair                              Cognition Arousal/Alertness: Awake/alert Behavior During Therapy: WFL for tasks assessed/performed Overall Cognitive Status: History of cognitive impairments - at baseline                                 General Comments: pt reports remembering PT from yesterday but didn't recall having "room spinning" sensation upon standing yesterday      Exercises      General Comments General comments (skin integrity, edema, etc.): Vestibular Treatment: Pt with + nystagmus with L sidelying dix hallpike but only sustained for 5 sec, significantly less than yesterday. Perform Semont maneuver again to treat L posterior canal BPPV.       Pertinent Vitals/Pain      Home Living  Prior Function            PT Goals (current goals can now be found in the care plan section) Progress towards PT goals: Progressing toward goals    Frequency    Min 3X/week      PT Plan Current plan remains appropriate    Co-evaluation              AM-PAC PT "6 Clicks" Daily Activity  Outcome Measure  Difficulty turning over in bed (including adjusting bedclothes, sheets and blankets)?: Unable Difficulty moving from lying on back to sitting on the side of the bed? : Unable Difficulty sitting down on and standing up from  a chair with arms (e.g., wheelchair, bedside commode, etc,.)?: Unable Help needed moving to and from a bed to chair (including a wheelchair)?: A Little Help needed walking in hospital room?: A Little Help needed climbing 3-5 steps with a railing? : A Little 6 Click Score: 12    End of Session Equipment Utilized During Treatment: Gait belt Activity Tolerance: Patient tolerated treatment well Patient left: in chair;with call bell/phone within reach;with chair alarm set Nurse Communication: Mobility status PT Visit Diagnosis: Unsteadiness on feet (R26.81);Muscle weakness (generalized) (M62.81);Other abnormalities of gait and mobility (R26.89);History of falling (Z91.81)     Time: 0034-9179 PT Time Calculation (min) (ACUTE ONLY): 14 min  Charges:  $Canalith Rep Proc: 8-22 mins                    G Codes:       [name], PT, DPT Pager #: [phone] Office #: [phone]    [name] 08/12/2017, 1:44 PM

## 2017-08-13 DIAGNOSIS — I63512 Cerebral infarction due to unspecified occlusion or stenosis of left middle cerebral artery: Secondary | ICD-10-CM | POA: Diagnosis present

## 2017-08-13 LAB — GLUCOSE, CAPILLARY: Glucose-Capillary: 94 mg/dL (ref 65–99)

## 2017-08-13 MED ORDER — ATORVASTATIN CALCIUM 80 MG PO TABS
80.0000 mg | ORAL_TABLET | Freq: Every day | ORAL | Status: DC
  Administered 2017-08-13: 80 mg via ORAL
  Filled 2017-08-13: qty 1

## 2017-08-13 NOTE — Progress Notes (Addendum)
RN talked with Jeneen Montgomery (niece) in [place] and Jerre Simon. Patient gave permission to discuss care with both. Deena stated that she frequently talks to pt and noticed he had been confused 7-8 days before admission.  Please call Deena for future updates and when transferred to SNF.  Please call Mardene Celeste to update on current situation (per patient request)  Deena: Quinn: [phone]

## 2017-08-13 NOTE — Progress Notes (Signed)
Physical Therapy Treatment Patient Details Name: [name] MRN: [id number] DOB: 09/08/1929 Today's Date: 08/13/2017    History of Present Illness Pt is an 84 y/o male who fell secondary to syncope and hit the back of his head. Imaging shows acute/subacute infarct in the L posterior MCA region, scattered punctuate acute infarctions in the L temporal lobe and deep white matter. Imaging is negative for fracture. PMH exertional dyspnea, HTN, PAD, CVA, cardiac cath     PT Comments    Pt reports improvement this session. Noted positive L Dix-Hallpike and treated x2 with Eply's Maneuver. 2nd round with improved symptoms with no nystagmus. Do feel there is a significant visual deficits that may be playing a role in current function. OT evaluation requested to assess vision further. Will continue to follow.   Follow Up Recommendations  SNF;Supervision/Assistance - 24 hour     Equipment Recommendations  Rolling walker with 5" wheels    Recommendations for Other Services       Precautions / Restrictions Precautions Precautions: Fall Precaution Comments: L posterior canal BPPV Restrictions Weight Bearing Restrictions: No    Mobility  Bed Mobility Overal bed mobility: Needs Assistance             General bed mobility comments: Pt was able to roll to the L without assistance (use of rails required). He was assisted into supine with bed trendelenberg position for posterior canal testing.  Transfers Overall transfer level: Needs assistance Equipment used: None Transfers: Sit to/from Stand Sit to Stand: Min assist         General transfer comment: Assist for balance support as pt powered up to full stand.   Ambulation/Gait Ambulation/Gait assistance: Min assist Ambulation Distance (Feet): 150 Feet Assistive device: 1 person hand held assist Gait Pattern/deviations: Step-through pattern;Decreased stride length;Staggering left;Staggering right Gait velocity: Decreased Gait  velocity interpretation: Below normal speed for age/gender General Gait Details: Pt occasionally staggering R and L due to general unsteadiness. With the addition of horizontal head turns, staggering increased (L more than R)   Stairs            Wheelchair Mobility    Modified Rankin (Stroke Patients Only)       Balance Overall balance assessment: Needs assistance Sitting-balance support: Feet supported Sitting balance-Leahy Scale: Good     Standing balance support: No upper extremity supported Standing balance-Leahy Scale: Poor Standing balance comment: Assistance required for pt to maintain balance in hall         08/13/17 1341  Positional Testing  Dix-Hallpike Dix-Hallpike Left  Dix-Hallpike Left  Dix-Hallpike Left Duration ~5 seconds  Dix-Hallpike Left Symptoms Upbeat, left rotatory nystagmus                     Cognition Arousal/Alertness: Awake/alert Behavior During Therapy: WFL for tasks assessed/performed Overall Cognitive Status: History of cognitive impairments - at baseline                                        Exercises      General Comments        Pertinent Vitals/Pain Pain Assessment: No/denies pain    Home Living                      Prior Function            PT Goals (current goals can now  be found in the care plan section) Acute Rehab PT Goals Patient Stated Goal: to potentially move into ILF or ALF, he does not feel safe living independently  PT Goal Formulation: With patient Time For Goal Achievement: 08/24/17 Potential to Achieve Goals: Fair Progress towards PT goals: Progressing toward goals    Frequency    Min 3X/week      PT Plan Current plan remains appropriate    Co-evaluation              AM-PAC PT "6 Clicks" Daily Activity  Outcome Measure  Difficulty turning over in bed (including adjusting bedclothes, sheets and blankets)?: Unable Difficulty moving from lying on  back to sitting on the side of the bed? : Unable Difficulty sitting down on and standing up from a chair with arms (e.g., wheelchair, bedside commode, etc,.)?: Unable Help needed moving to and from a bed to chair (including a wheelchair)?: A Little Help needed walking in hospital room?: A Little Help needed climbing 3-5 steps with a railing? : A Little 6 Click Score: 12    End of Session Equipment Utilized During Treatment: Gait belt Activity Tolerance: Patient tolerated treatment well Patient left: in chair;with call bell/phone within reach;with chair alarm set Nurse Communication: Mobility status PT Visit Diagnosis: Unsteadiness on feet (R26.81);Muscle weakness (generalized) (M62.81);Other abnormalities of gait and mobility (R26.89);History of falling (Z91.81)     Time: 5883-2549 PT Time Calculation (min) (ACUTE ONLY): 39 min  Charges:  $Therapeutic Activity: 23-37 mins $Canalith Rep Proc: 8-22 mins                    G Codes:       [name], PT, DPT Acute Rehabilitation Services Pager: [phone]    [name] 08/13/2017, 1:46 PM

## 2017-08-13 NOTE — Progress Notes (Signed)
PROGRESS NOTE  MARQUI FORMBY QVZ:[id number] DOB: [birth year] DOA: 08/09/2017 PCP: Patient, [name]  HPI/Recap of past 24 hours:  [name] is a 84 y.o. year old male with medical history significant for hypertension, PAD with severe SFA d/s bilaterally on ABI ( 2014), pulmonary hypertension (RVSP 69) who presented on 08/09/2017 with witnessed fall and was found to have witnessed syncope of unclear etiology.    Interval History  Working with physical therapy this morning.  No acute events overnight.  Has no acute complaints   Assessment/Plan: Principal Problem:   Syncope Active Problems:   Claudication of calf muscles (HCC)   Essential hypertension   PAD (peripheral artery disease) (HCC)   Abnormal finding on urinalysis   Right inguinal hernia   Syncope and collapse   #Acute/subacute infarction in L MCA territory - MRI brain from 3/8 that demonstrates stroke. Previous CT head initially wedge-shaped sounds do seem consistent with possible subacute to chronic watershed infarcts, suspect embolic phenomenon -Patient has no focal deficits on exam -Telemetry has not shown any atrial fibrillation, carotid duplex bilaterally shows 1-39% stenosis of left ICA, TTE with preserved EF (60-65%), grade 1 diastolic dysfunction -Continue aspirin, will consult neurology, increase home statin to high intensity dose -Discussed with neurology who agrees with above workup, will arrange outpatient follow-up with Dr. Leonie Man on discharge  #Concern for cognitive impairment, possible MCI or Dementia Reports inability to pay bills or drive any longer Bouts of confusion during interview though overall oriented x4 Scared to return home as he doesn't think he can take care of himself During PT evaluation difficulty performing cognitive tasks -Would benefit from skilled nursing facility, currently awaiting placement as patient is medically stable  # Witnessed Syncope, likely due to unsteady gait related  to possible dementia -some concern for dementia/MCI could contribute to poor gait/steadiness Infectious etiology ruled out. Metabolic workup unrevealing CT head with subacute CVA but no acute abnormalities Orthostatic vitals negative No arrhythmias on telemetry TTE no WMA or acute valvular changes. Preserved EF -PTtreatment for left posterior canal BPPV -continue to monitor on telemetry -Awaiting placement at SNF, will benefit from continued vestibular therapy at significant  #Severe PAD with severe SFA disease, stable ABI (2014) : occlusive proximal right SFA, 70-99%SFA Patient currently asymptomatic Unable to palpate pedal pulses on admission, vascular surgery able to obtain distal pulses with Doppler -No need for acute intervention per vascular surgery recommendations -ABIs on 08/11/17:  #WBC on UA Patient asymptomatic UA without bacteria, no leukocytes, no nitrites Less likely UTI -Urine culture already sent on admission -Hold off on further antibiotic therapy and continue to monitor  #Hypertension, at goal -Continue lisinopril 40 mg -IV hydralazine as needed  #Scalp laceration Trauma related to recent fall -Staples in place -Continue to monitor  #Right inguinal hernia, reducible, stable Wearing hernia belt No current pain -continue to monitor  Code Status: Full   Family Communication: No family at bedside  Disposition Plan: Medically stable, awaiting dispo for potential SNF   Consultants:  Vascular surgery   Procedures:  TTE 08/10/17:  EF 32-95%, grade 1 diastolic dysfunction, LVH, trivial mitral valve regurgitation  ABI 08/11/17: -consistent with moderate right and left lower extremity arterial disease  Antimicrobials:  none  Cultures:  none  Telemetry:yes  DVT prophylaxis: SCDs   Objective: Vitals:   08/13/17 0355 08/13/17 0449 08/13/17 0855 08/13/17 1236  BP:  129/68 115/61 111/64  Pulse:  62 69 78  Resp:  18 16 20   Temp:  98.5 F (36.9  C) 97.9 F (36.6 C) 98 F (36.7 C)  TempSrc:  Oral Oral Oral  SpO2:  100% 100% 100%  Weight: 67.5 kg (148 lb 13 oz)     Height:        Intake/Output Summary (Last 24 hours) at 08/13/2017 1342 Last data filed at 08/13/2017 1238 Gross per 24 hour  Intake 323 ml  Output 845 ml  Net -522 ml   Filed Weights   08/11/17 0500 08/12/17 0449 08/13/17 0355  Weight: 67.7 kg (149 lb 4 oz) 68.8 kg (151 lb 10.8 oz) 67.5 kg (148 lb 13 oz)    Exam:  General: comfortably lying in bed Eyes: EOMI, anicteric ENT: Oral Mucosa clear and moist, poor dentition Cardiovascular: regular rate and rhythm, no murmurs, rubs or gallops, no edema Respiratory: Normal respiratory effort on room air, lungs clear to auscultation bilaterally Abdomen: soft, non-distended, non-tender, normal bowel sounds, support belt in place,  Skin: stapled laceration to posterior scalp Musculoskeletal:Good ROM, no contractures. Normal muscle tone Neurologic: 5 out of 5 strength in both upper and lower extremities, no appreciable focal deficits, no facial droop, normal speech.Alert to person, context and place. Unsure of month, year. Psychiatric:Appropriate affect, and mood  Data Reviewed: CBC: Recent Labs  Lab 08/09/17 2032 08/09/17 2043 08/10/17 0548  WBC 6.3  --  5.8  NEUTROABS 4.7  --   --   HGB 13.7 14.3 12.2*  HCT 42.0 42.0 38.5*  MCV 88.4  --  88.7  PLT 384  --  408   Basic Metabolic Panel: Recent Labs  Lab 08/09/17 2032 08/09/17 2043 08/10/17 0548  NA 141 143 143  K 3.9 3.7 3.4*  CL 103 101 107  CO2 27  --  26  GLUCOSE 118* 114* 99  BUN 9 11 8   CREATININE 0.92 0.80 0.88  CALCIUM 9.6  --  8.5*   GFR: Estimated Creatinine Clearance: 53.4 mL/min (by C-G formula based on SCr of 0.88 mg/dL). Liver Function Tests: Recent Labs  Lab 08/09/17 2032 08/10/17 0548  AST 21 12*  ALT 9* 8*  ALKPHOS 95 82  BILITOT 1.4* 0.6  PROT 5.9* 5.3*  ALBUMIN 3.7 3.1*   No results for input(s): LIPASE, AMYLASE in  the last 168 hours. Recent Labs  Lab 08/09/17 2032  AMMONIA 26   Coagulation Profile: Recent Labs  Lab 08/09/17 2032  INR 1.07   Cardiac Enzymes: Recent Labs  Lab 08/10/17 0142 08/10/17 0548 08/10/17 1307  TROPONINI <0.03 <0.03 <0.03   BNP (last 3 results) No results for input(s): PROBNP in the last 8760 hours. HbA1C: No results for input(s): HGBA1C in the last 72 hours. CBG: Recent Labs  Lab 08/09/17 2053 08/11/17 0633 08/12/17 0645 08/13/17 0657  GLUCAP 116* 88 82 94   Lipid Profile: No results for input(s): CHOL, HDL, LDLCALC, TRIG, CHOLHDL, LDLDIRECT in the last 72 hours. Thyroid Function Tests: No results for input(s): TSH, T4TOTAL, FREET4, T3FREE, THYROIDAB in the last 72 hours. Anemia Panel: No results for input(s): VITAMINB12, FOLATE, FERRITIN, TIBC, IRON, RETICCTPCT in the last 72 hours. Urine analysis:    Component Value Date/Time   COLORURINE YELLOW 08/09/2017 1856   APPEARANCEUR CLEAR 08/09/2017 1856   LABSPEC 1.012 08/09/2017 1856   PHURINE 6.0 08/09/2017 1856   GLUCOSEU NEGATIVE 08/09/2017 1856   HGBUR NEGATIVE 08/09/2017 1856   BILIRUBINUR NEGATIVE 08/09/2017 1856   BILIRUBINUR small 10/17/2012 1356   KETONESUR NEGATIVE 08/09/2017 1856   PROTEINUR 30 (A) 08/09/2017 1856  UROBILINOGEN 4.0 10/17/2012 1356   UROBILINOGEN 0.2 07/04/2010 0805   NITRITE NEGATIVE 08/09/2017 1856   LEUKOCYTESUR NEGATIVE 08/09/2017 1856   Sepsis Labs: @LABRCNTIP (procalcitonin:4,lacticidven:4)  )No results found for this or any previous visit (from the past 240 hour(s)).    Studies: No results found.  Scheduled Meds: . aspirin EC  81 mg Oral Daily  . atorvastatin  80 mg Oral q1800  . feeding supplement (ENSURE ENLIVE)  237 mL Oral BID BM  . lisinopril  40 mg Oral Daily  . sodium chloride flush  3 mL Intravenous Q12H    Continuous Infusions: . sodium chloride       LOS: 1 day     Desiree Hane, MD [facility] Pager [phone]  If  7PM-7AM, please contact night-coverage [email or web address] Password [email or web address] 08/13/2017, 1:42 PM

## 2017-08-13 NOTE — Care Management Note (Signed)
Case Management Note  Patient Details  Name: [name] MRN: [id number] Date of Birth: 1930/01/13  Subjective/Objective:      Pt admitted with CVA. He is from home alone.              Action/Plan: PT recommending SNF. CM following for d/c disposition.  Expected Discharge Date:                  Expected Discharge Plan:  Skilled Nursing Facility  In-House Referral:  Clinical Social Work  Discharge planning Services     Post Acute Care Choice:    Choice offered to:     DME Arranged:    DME Agency:     HH Arranged:    [facility] Agency:     Status of Service:  In process, will continue to follow  If discussed at Long Length of Stay Meetings, dates discussed:    Additional Comments:  Pollie Friar, RN 08/13/2017, 1:15 PM

## 2017-08-14 MED ORDER — ATORVASTATIN CALCIUM 80 MG PO TABS: 80.0000 mg | ORAL_TABLET | Freq: Every day | ORAL | Status: DC

## 2017-08-14 MED ORDER — ENSURE ENLIVE PO LIQD: 237.0000 mL | Freq: Two times a day (BID) | ORAL | 12 refills | Status: DC

## 2017-08-14 MED ORDER — LISINOPRIL 40 MG PO TABS: 40.0000 mg | ORAL_TABLET | Freq: Every day | ORAL | 5 refills | Status: DC

## 2017-08-14 MED ORDER — ASPIRIN 81 MG PO TBEC: 81.0000 mg | DELAYED_RELEASE_TABLET | Freq: Every day | ORAL | Status: DC

## 2017-08-14 MED ORDER — ACETAMINOPHEN 325 MG PO TABS: 650.0000 mg | ORAL_TABLET | Freq: Four times a day (QID) | ORAL | Status: AC | PRN

## 2017-08-14 NOTE — Progress Notes (Signed)
Physical Therapy Treatment Patient Details Name: [name] MRN: [id number] DOB: [birth year] Today's Date: 08/14/2017    History of Present Illness Pt is an 84 y/o male who fell secondary to syncope and hit the back of his head. Imaging shows acute/subacute infarct in the L posterior MCA region, scattered punctuate acute infarctions in the L temporal lobe and deep white matter. Imaging is negative for fracture. PMH exertional dyspnea, HTN, PAD, CVA, cardiac cath     PT Comments    Pt progressing towards physical therapy goals. Dix-Hallpike negative to the L this session and pt reports no dizziness with transitions to/from EOB or to/from standing. Noted some R side inattention vs decreased vision to the R. Pt looking mostly to the left side of the hall and required increased time to scan to the right side of hallway to identify the object therapist challenged him to find. The patient acknowledged that it took a while for him to find the object and laughed about it. Will assess further next session. Although it appears that the L posterior canal BPPV has resolved, acute PT will continue to follow pt for balance training, decrease risk of falls, and to improve tolerance for functional activity.  Follow Up Recommendations  SNF;Supervision/Assistance - 24 hour     Equipment Recommendations  Rolling walker with 5" wheels    Recommendations for Other Services       Precautions / Restrictions Precautions Precautions: Fall Precaution Comments: L posterior canal BPPV - appears resolved 08/14/17 Restrictions Weight Bearing Restrictions: No    Mobility  Bed Mobility Overal bed mobility: Needs Assistance Bed Mobility: Supine to Sit     Supine to sit: Supervision     General bed mobility comments: supervision for safety  Transfers Overall transfer level: Needs assistance Equipment used: None Transfers: Sit to/from Stand Sit to Stand: Min guard         General transfer comment:  Assist for balance support as pt powered up to full stand.   Ambulation/Gait Ambulation/Gait assistance: Min assist Ambulation Distance (Feet): 150 Feet Assistive device: 1 person hand held assist Gait Pattern/deviations: Step-through pattern;Decreased stride length;Staggering left;Staggering right Gait velocity: Decreased Gait velocity interpretation: Below normal speed for age/gender General Gait Details: Continues to stagger with head turns. Asked pt to find an object in the hall and pt scanning mostly to the L. Increased time for pt to find the object on the right.   Stairs            Wheelchair Mobility    Modified Rankin (Stroke Patients Only)       Balance Overall balance assessment: Needs assistance Sitting-balance support: Feet supported Sitting balance-Leahy Scale: Good     Standing balance support: No upper extremity supported Standing balance-Leahy Scale: Fair Standing balance comment: Able to maitain standing without UE support                            Cognition Arousal/Alertness: Awake/alert Behavior During Therapy: WFL for tasks assessed/performed Overall Cognitive Status: Impaired/Different from baseline Area of Impairment: Attention;Memory;Following commands;Safety/judgement;Awareness;Problem solving                   Current Attention Level: Selective Memory: Decreased short-term memory Following Commands: Follows one step commands inconsistently;Follows one step commands with increased time Safety/Judgement: Decreased awareness of safety;Decreased awareness of deficits Awareness: Emergent Problem Solving: Slow processing;Difficulty sequencing;Decreased initiation;Requires verbal cues;Requires tactile cues        Exercises  General Comments        Pertinent Vitals/Pain Pain Assessment: No/denies pain    Home Living                      Prior Function            PT Goals (current goals can now be  found in the care plan section) Acute Rehab PT Goals Patient Stated Goal: to potentially move into ILF or ALF, he does not feel safe living independently  PT Goal Formulation: With patient Time For Goal Achievement: 08/24/17 Potential to Achieve Goals: Fair Progress towards PT goals: Progressing toward goals    Frequency    Min 3X/week      PT Plan Current plan remains appropriate    Co-evaluation              AM-PAC PT "6 Clicks" Daily Activity  Outcome Measure  Difficulty turning over in bed (including adjusting bedclothes, sheets and blankets)?: None Difficulty moving from lying on back to sitting on the side of the bed? : A Little Difficulty sitting down on and standing up from a chair with arms (e.g., wheelchair, bedside commode, etc,.)?: A Little Help needed moving to and from a bed to chair (including a wheelchair)?: A Little Help needed walking in hospital room?: A Little Help needed climbing 3-5 steps with a railing? : A Little 6 Click Score: 19    End of Session Equipment Utilized During Treatment: Gait belt Activity Tolerance: Patient tolerated treatment well Patient left: in chair;with call bell/phone within reach;with chair alarm set Nurse Communication: Mobility status PT Visit Diagnosis: Unsteadiness on feet (R26.81);Muscle weakness (generalized) (M62.81);Other abnormalities of gait and mobility (R26.89);History of falling (Z91.81)     Time: 2831-5176 PT Time Calculation (min) (ACUTE ONLY): 16 min  Charges:  $Gait Training: 8-22 mins                    G Codes:       [name], PT, DPT Acute Rehabilitation Services Pager: [phone]    [name] 08/14/2017, 12:47 PM

## 2017-08-14 NOTE — Clinical Social Work Placement (Signed)
Nurse to call report to [facility]  NOTE  Date:  08/14/2017  Patient Details  Name: [name] MRN: [id number] Date of Birth: Sep 03, 1929  Clinical Social Work is seeking post-discharge placement for this patient at the [place] level of care (*CSW will initial, date and re-position this form in  chart as items are completed):  Yes   Patient/family provided with [facility] Work Department's list of facilities offering this level of care within the geographic area requested by the patient (or if unable, by the patient's family).  Yes   Patient/family informed of their freedom to choose among providers that offer the needed level of care, that participate in Medicare, Medicaid or managed care program needed by the patient, have an available bed and are willing to accept the patient.  Yes   Patient/family informed of [place]'s ownership interest in [facility] and [facility], as well as of the fact that they are under no obligation to receive care at these facilities.  PASRR submitted to EDS on       PASRR number received on 08/10/17     Existing PASRR number confirmed on       FL2 transmitted to all facilities in geographic area requested by pt/family on 08/10/17     FL2 transmitted to all facilities within larger geographic area on       Patient informed that his/her managed care company has contracts with or will negotiate with certain facilities, including the following:        Yes   Patient/family informed of bed offers received.  Patient chooses bed at Other - please specify in the comment section below:([facility])     Physician recommends and patient chooses bed at      Patient to be transferred to Other - please specify in the comment section below:([facility]) on 08/14/17.  Patient to be transferred to facility by [facility]     Patient family notified on 08/14/17 of  transfer.  Name of family member notified:  Truddie Crumble     PHYSICIAN       Additional Comment:    _______________________________________________ Geralynn Ochs, LCSW 08/14/2017, 2:12 PM

## 2017-08-14 NOTE — Progress Notes (Signed)
Discharge  Report called to SNF  PIV's removed, belongings packs and pt waiting for transport.

## 2017-08-14 NOTE — Evaluation (Signed)
Occupational Therapy Evaluation Patient Details Name: [name] MRN: [id number] DOB: [birth year] Today's Date: 08/14/2017    History of Present Illness Pt is an 84 y/o male who fell secondary to syncope and hit the back of his head. Imaging shows acute/subacute infarct in the L posterior MCA region, scattered punctuate acute infarctions in the L temporal lobe and deep white matter. Imaging is negative for fracture. PMH exertional dyspnea, HTN, PAD, CVA, cardiac cath    Clinical Impression   PTA, pt was living alone and was independent. Pt currently performing ADLs and functional mobility at [facility] A level for decreased balance and safety. Pt presenting with cognitive and vision deficits that significantly impact functional performance. Pt requiring increased cues throughout session demonstrating poor awareness, problem solving, attention, and memory. Pt would benefit from further acute OT to facilitate safe dc. Recommend dc to SNF for further OT to increase safety and independence with ADLs and functional mobility.     Follow Up Recommendations  SNF;Supervision/Assistance - 24 hour    Equipment Recommendations  Other (comment)(Defer to next venue)    Recommendations for Other Services PT consult;Speech consult     Precautions / Restrictions Precautions Precautions: Fall Precaution Comments: L posterior canal BPPV Restrictions Weight Bearing Restrictions: No      Mobility Bed Mobility Overal bed mobility: Needs Assistance Bed Mobility: Supine to Sit     Supine to sit: Supervision     General bed mobility comments: supervision for safety  Transfers Overall transfer level: Needs assistance Equipment used: None Transfers: Sit to/from Stand Sit to Stand: Min guard         General transfer comment: Min Guard for safety'    Balance Overall balance assessment: Needs assistance Sitting-balance support: Feet supported Sitting balance-Leahy Scale: Good       Standing balance support: No upper extremity supported Standing balance-Leahy Scale: Fair Standing balance comment: Able to maitain standing without UE support                           ADL either performed or assessed with clinical judgement   ADL Overall ADL's : Needs assistance/impaired Eating/Feeding: Set up;Sitting   Grooming: Oral care;Min guard;Standing Grooming Details (indicate cue type and reason): Pt performing oral care at sink with Min Guard for safety in standing. Pt requiring Min verbal cues to attend to right visual field and locate grooming items. Pt requiring cues throughout for sequencing demosntrating poor problem solving and attention Upper Body Bathing: Min guard;Sitting;Cueing for sequencing;Cueing for safety   Lower Body Bathing: Min guard;Sit to/from stand;Cueing for safety;Cueing for sequencing   Upper Body Dressing : Min guard;Cueing for sequencing;Cueing for safety;Sitting   Lower Body Dressing: Min guard;Sit to/from stand;Cueing for safety;Cueing for sequencing   Toilet Transfer: Min guard;Ambulation Toilet Transfer Details (indicate cue type and reason): Pt requiring Min Guard A for safety and simulated toilet transfer to recliner         Functional mobility during ADLs: Min guard;Cueing for safety General ADL Comments: Pt highly impacted by cognition and vision deficits.      Vision Baseline Vision/History: Wears glasses Wears Glasses: At all times Patient Visual Report: Diplopia Vision Assessment?: Yes;Vision impaired- to be further tested in functional context Eye Alignment: Within Functional Limits Alignment/Gaze Preference: Within Defined Limits Tracking/Visual Pursuits: Decreased smoothness of horizontal tracking;Decreased smoothness of vertical tracking;Requires cues, head turns, or add eye shifts to track;Impaired - to be further tested in functional context Saccades:  Impaired - to be further tested in functional  context Convergence: Impaired (comment)(Required Max cues) Visual Fields: Right visual field deficit;Impaired-to be further tested in functional context Additional Comments: Left eye dominant. Pt with decreased attention to right visual field and required cues during testing and funcitonal task.  Having pt draw a clock, pt drawing circle towards left direction. Poor organization of visual scanning with turning of head to right but still not attending to objects.             Perception     Praxis      Pertinent Vitals/Pain Pain Assessment: No/denies pain     Hand Dominance Right   Extremity/Trunk Assessment Upper Extremity Assessment Upper Extremity Assessment: Generalized weakness   Lower Extremity Assessment Lower Extremity Assessment: Defer to PT evaluation   Cervical / Trunk Assessment Cervical / Trunk Assessment: Kyphotic   Communication Communication Communication: Expressive difficulties   Cognition Arousal/Alertness: Awake/alert Behavior During Therapy: WFL for tasks assessed/performed Overall Cognitive Status: Impaired/Different from baseline Area of Impairment: Attention;Memory;Following commands;Safety/judgement;Awareness;Problem solving                   Current Attention Level: Selective Memory: Decreased short-term memory Following Commands: Follows one step commands inconsistently;Follows one step commands with increased time Safety/Judgement: Decreased awareness of safety;Decreased awareness of deficits Awareness: Emergent Problem Solving: Slow processing;Difficulty sequencing;Decreased initiation;Requires verbal cues;Requires tactile cues General Comments: Pt requiring increased cues and time throughout session. Poor attention and highly distracted. Pt verbalizing and speaking his activities stating/repeating his actions such as "brush my teeth', brush my teeth, brush my teeth" "use the cup, use the cup". Throughout grooming task, pt requiring VCs  for sequencing. For example, after brushing his teeth and needing to spit he asked "what do I do now?" not knowing that the next step would be to spit into the sink. During a vision test, pt asked to circle Bs and he circled both Bs and Fs   General Comments       Exercises     Shoulder Instructions      Home Living Family/patient expects to be discharged to:: Private residence Living Arrangements: Alone   Type of Home: Apartment Home Access: Level entry     Home Layout: One level     Bathroom Shower/Tub: Corporate investment banker: Standard     Home Equipment: None          Prior Functioning/Environment Level of Independence: Independent        Comments: Pt reports he was indpendent "until recently". Has not been driving recently and deficulty with IADLs        OT Problem List: Decreased activity tolerance;Impaired balance (sitting and/or standing);Impaired vision/perception;Decreased cognition;Decreased safety awareness;Decreased knowledge of use of DME or AE;Decreased knowledge of precautions      OT Treatment/Interventions: Self-care/ADL training;Therapeutic exercise;Energy conservation;DME and/or AE instruction;Therapeutic activities;Patient/family education;Visual/perceptual remediation/compensation    OT Goals(Current goals can be found in the care plan section) Acute Rehab OT Goals Patient Stated Goal: to potentially move into ILF or ALF, he does not feel safe living independently  OT Goal Formulation: With patient Time For Goal Achievement: 08/28/17 Potential to Achieve Goals: Good  OT Frequency: Min 2X/week   Barriers to D/C: Decreased caregiver support  Lives alone       Co-evaluation              AM-PAC PT "6 Clicks" Daily Activity     Outcome Measure Help from another person eating meals?: None  Help from another person taking care of personal grooming?: A Little Help from another person toileting, which includes using  toliet, bedpan, or urinal?: A Little Help from another person bathing (including washing, rinsing, drying)?: A Little Help from another person to put on and taking off regular upper body clothing?: A Little Help from another person to put on and taking off regular lower body clothing?: A Little 6 Click Score: 19   End of Session Equipment Utilized During Treatment: Gait belt Nurse Communication: Mobility status  Activity Tolerance: Patient tolerated treatment well Patient left: in chair;with call bell/phone within reach;with chair alarm set  OT Visit Diagnosis: Other abnormalities of gait and mobility (R26.89);Muscle weakness (generalized) (M62.81);Low vision, both eyes (H54.2);Other symptoms and signs involving cognitive function                Time: 9024-0973 OT Time Calculation (min): 37 min Charges:  OT General Charges $OT Visit: 1 Visit OT Evaluation $OT Eval Moderate Complexity: 1 Mod OT Treatments $Self Care/Home Management : 8-22 mins G-Codes:     [place], OTR/L Acute Rehab Pager: [phone] Office: [place] 08/14/2017, 9:02 AM

## 2017-08-14 NOTE — Discharge Summary (Addendum)
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 changes elsewhere throughout the brain as outlined above. Electronically Signed   By: Nelson Chimes M.D.   On: 08/10/2017 08:05    Microbiology: No results found for this or any previous visit (from the past 240 hour(s)).   Labs: Basic Metabolic Panel: Recent Labs  Lab 08/09/17 2032 08/09/17 2043 08/10/17 0548  NA 141 143 143  K 3.9 3.7 3.4*  CL 103 101 107  CO2 27  --  26  GLUCOSE 118* 114* 99  BUN 9 11 8   CREATININE 0.92 0.80 0.88  CALCIUM 9.6  --  8.5*   Liver Function Tests: Recent Labs  Lab 08/09/17 2032 08/10/17 0548  AST 21 12*  ALT 9* 8*  ALKPHOS 95 82  BILITOT 1.4* 0.6  PROT 5.9* 5.3*  ALBUMIN 3.7 3.1*   No results for input(s): LIPASE, AMYLASE in the last 168 hours. Recent Labs  Lab 08/09/17 2032  AMMONIA 26   CBC: Recent Labs  Lab 08/09/17 2032 08/09/17 2043 08/10/17 0548  WBC 6.3  --  5.8  NEUTROABS 4.7  --   --   HGB 13.7 14.3 12.2*  HCT 42.0 42.0 38.5*  MCV 88.4  --  88.7  PLT 384  --  345   Cardiac Enzymes: Recent Labs  Lab 08/10/17 0142 08/10/17 0548 08/10/17 1307  TROPONINI <0.03 <0.03 <0.03   BNP: BNP (last 3 results) No results for input(s): BNP in the last 8760 hours.  ProBNP  (last 3 results) No results for input(s): PROBNP in the last 8760 hours.  CBG: Recent Labs  Lab 08/09/17 2053 08/11/17 0633 08/12/17 0645 08/13/17 0657  GLUCAP 116* 88 82 94       Signed:  Desiree Hane, MD [facility] 08/14/2017, 12:18 PM

## 2017-08-15 ENCOUNTER — Encounter: Payer: Self-pay | Admitting: Adult Health

## 2017-08-15 ENCOUNTER — Non-Acute Institutional Stay (SKILLED_NURSING_FACILITY): Payer: Medicare Other | Admitting: Adult Health

## 2017-08-15 DIAGNOSIS — E785 Hyperlipidemia, unspecified: Secondary | ICD-10-CM | POA: Diagnosis not present

## 2017-08-15 DIAGNOSIS — I10 Essential (primary) hypertension: Secondary | ICD-10-CM | POA: Diagnosis not present

## 2017-08-15 DIAGNOSIS — R5381 Other malaise: Secondary | ICD-10-CM

## 2017-08-15 DIAGNOSIS — E440 Moderate protein-calorie malnutrition: Secondary | ICD-10-CM

## 2017-08-15 DIAGNOSIS — I63512 Cerebral infarction due to unspecified occlusion or stenosis of left middle cerebral artery: Secondary | ICD-10-CM

## 2017-08-15 DIAGNOSIS — I739 Peripheral vascular disease, unspecified: Secondary | ICD-10-CM | POA: Diagnosis not present

## 2017-08-15 DIAGNOSIS — F0150 Vascular dementia without behavioral disturbance: Secondary | ICD-10-CM

## 2017-08-15 DIAGNOSIS — E46 Unspecified protein-calorie malnutrition: Secondary | ICD-10-CM | POA: Insufficient documentation

## 2017-08-15 NOTE — Progress Notes (Signed)
Location:   [facility] Room Number: 125 A Place of Service:  SNF (31)   CODE STATUS: Full Code  No Known Allergies  Chief Complaint  Patient presents with  . Hospitalization Follow-up    Hospital Follow up    HPI:   He is a 84 year old man; how lives independently; who was hospitalized for an acute left MCA stroke. He had a fall at a bus stop. He is having some confusion present. There is concern for underlying dementia present. He is here for short term rehab. More than likely he will need long term placement in either assisted living or SNF. He denies any pain; no headaches; no changes in appetite; no insomnia present. He will continue to be followed for his chronic illnesses including: hypertension; dyslipidemia; and hypertension. There are no nursing concerns at this time.      Past Medical History:  Diagnosis Date  . BPH (benign prostatic hyperplasia)   . Cataract   . Exertional dyspnea    with exertion  . Hypertension   . Mitral valvular regurgitation June 2014   Mild to moderate MR on echocardiogram  . PAD (peripheral artery disease) ([place])  December 2014   Lower extremity Dopplers/ABIs: RABI 0.29, LABI 0.66; bilateral external iliacs with significant diameter reduction; R. SFA occlusive disease throughout into popliteal artery with 0 vessel runoff. Anterior tibial reconstitutes distally. 70-99% reduction in all SFA. One-vessel runoff (anterior tibial)  . Stroke [facility])     Past Surgical History:  Procedure Laterality Date  . ABDOMINAL ANGIOGRAM  07/07/2013   Procedure: ABDOMINAL ANGIOGRAM;  Surgeon: Lorretta Harp, MD;  Location: [facility] CATH LAB;  Service: Cardiovascular;;  . CHOLECYSTECTOMY  07/04/2010  . ERCP N/A 11/27/2012   Procedure: ENDOSCOPIC RETROGRADE CHOLANGIOPANCREATOGRAPHY (ERCP);  Surgeon: Inda Castle, MD;  Location: [facility];  Service: Endoscopy;  Laterality: N/A;  . EYE SURGERY Right   . LEFT HEART CATHETERIZATION WITH CORONARY  ANGIOGRAM N/A 07/07/2013   Procedure: LEFT HEART CATHETERIZATION WITH CORONARY ANGIOGRAM;  Surgeon: Lorretta Harp, MD;  Location: [facility] CATH LAB;  Service: Cardiovascular;  Laterality: N/A;  . LOWER EXTREMITY ANGIOGRAM N/A 07/07/2013   Procedure: LOWER EXTREMITY ANGIOGRAM;  Surgeon: Lorretta Harp, MD;  Location: [facility] CATH LAB;  Service: Cardiovascular;  Laterality: N/A;  . NM MYOVIEW LTD  December 2014   Apical thinning but no ischemia. EF 55%  . PROSTATE SURGERY     Dr. Lowella Bandy  . TRANSTHORACIC ECHOCARDIOGRAM  June 2014   Basal septal thickening with moderate LVH. EF 65%. Normal wall motion. Diastolic dysfunction/NOS. Aortic sclerosis without stenosis. Mild to moderate MR    Social History   Socioeconomic History  . Marital status: Single    Spouse name: Not on file  . Number of children: Not on file  . Years of education: Not on file  . Highest education level: Not on file  Social Needs  . Financial resource strain: Not on file  . Food insecurity - worry: Not on file  . Food insecurity - inability: Not on file  . Transportation needs - medical: Not on file  . Transportation needs - non-medical: Not on file  Occupational History  . Occupation: Retired    Fish farm manager: RETIRED  Tobacco Use  . Smoking status: Former Smoker    Types: Cigarettes    Last attempt to quit: 11/25/1980    Years since quitting: 36.7  . Smokeless tobacco: Never Used  Substance and Sexual Activity  .  Alcohol use: Yes    Alcohol/week: 2.4 oz    Types: 4 Standard drinks or equivalent per week    Comment: beers  . Drug use: No  . Sexual activity: No  Other Topics Concern  . Not on file  Social History Narrative   He is originally from [place]. He has a history of long-standing tobacco use but quit several years ago. He also has a history of former alcohol abuse as well.   He is retired and currently single.   Family History  Problem Relation Age of Onset  . Diabetes Sister   . Cancer Sister   .  Cancer Mother   . Cancer Father   . Cancer Sister       VITAL SIGNS BP 122/77   Pulse 71   Temp 98 F (36.7 C)   Resp 17   Ht 5\' 5"  (1.651 m)   Wt 147 lb 8 oz (66.9 kg)   SpO2 98%   BMI 24.55 kg/m   Outpatient Encounter Medications as of 08/15/2017  Medication Sig  . acetaminophen (TYLENOL) 325 MG tablet Take 2 tablets (650 mg total) by mouth every 6 (six) hours as needed for mild pain (or Fever >/= 101).  Marland Kitchen aspirin 81 MG EC tablet Take 1 tablet (81 mg total) by mouth daily.  Marland Kitchen atorvastatin (LIPITOR) 80 MG tablet Take 1 tablet (80 mg total) by mouth daily at 6 PM.  . feeding supplement, ENSURE ENLIVE, (ENSURE ENLIVE) LIQD Take 237 mLs by mouth 2 (two) times daily between meals.  Marland Kitchen lisinopril (PRINIVIL,ZESTRIL) 40 MG tablet Take 1 tablet (40 mg total) by mouth daily.   No facility-administered encounter medications on file as of 08/15/2017.      SIGNIFICANT DIAGNOSTIC EXAMS   TODAY:   08-09-17: chest x-ray: No active cardiopulmonary disease. Stable mild cardiomegaly.   08-09-17: pelvic x-ray: No definite acute osseous abnormality   08-09-17: ct of head and cervical spine: 1. Small left occipital scalp contusion and laceration without underlying skull fracture. 2. Cerebral atrophy with chronic small vessel ischemic disease. Wedge-shaped hypodensities in the posterior parietal lobes left greater than right without hemorrhage. These likely represent age indeterminate watershed infarcts or likely subacute to chronic. Additional small cortical infarcts near the vertex of brain involving the high parietal lobe. 3. Cervical spondylosis without acute cervical spine fracture. 4. Lipoma deep to the left subscapularis muscle measuring at least 5.3 x 2.2 cm on axial series 8, image 79.  08-10-17: MRI of brain: Acute/subacute infarction in the posterior portion of the left MCA territory affecting the posterior temporal lobe and temporoparietal junction region. Few other scattered punctate  acute infarctions in the left temporal lobe and left hemispheric deep white matter. Findings most consistent with embolic disease to the left MCA territory. Some component of watershed infarction may be present. Petechial blood products in the region of infarction with visible thrombosed vessel on the gradient echo imaging. No frank parenchymal hematoma. No mass effect or shift. Old ischemic changes elsewhere throughout the brain as outlined Above.  08-10-17: 2-d echo: - Left ventricle: The cavity size was normal. Wall thickness was increased in a pattern of mild LVH. There was moderate focal basal hypertrophy of the septum. Systolic function was normal. The estimated ejection fraction was in the range of 60% to 65%. Doppler parameters are consistent with abnormal left ventricular relaxation (grade 1 diastolic dysfunction). The E/e&' ratio is between 8-15, suggesting indeterminate LV filling pressure. - Aortic valve: Trileaflet. Sclerosis without stenosis.  There was no regurgitation. - Mitral valve: Mildly thickened leaflets . There was trivial regurgitation. - Left atrium: Moderately dilated. - Right ventricle: The cavity size was normal. Wall thickness was normal. Systolic function was normal. Lateral annulus peak S velocity: 14.3 cm/s. - Tricuspid valve: There was mild regurgitation. - Pulmonary arteries: PA peak pressure: 29 mm Hg (S). - Inferior vena cava: The vessel was normal in size. The respirophasic diameter changes were in the normal range (>= 50%), consistent with normal central venous pressure.  08-10-17: carotid doppler: Right Carotid: Velocities in the right ICA are consistent with a 1-39% stenosis. Left Carotid: Velocities in the left ICA are consistent with a 1-39% stenosis.   08-11-17: bilateral ABI: Right: Resting right ankle-brachial index indicates moderate right lower extremity arterial disease. The right toe-brachial index is abnormal. ABIs are unreliable. Left: Resting left  ankle-brachial index indicates moderate left lower extremity arterial disease. The left toe-brachial index is abnormal. ABIs are unreliable.   LABS REVIEWED:   08-09-17: wbc 6.3; hgb 13.7; hct 42.0; mcv 88.4 plt 384; glucose 118; bun 9; creat 0.92; k+ 3.9; na++ 141; ca 9.6; total bili 1.4; albumin 3.7 08-10-17: wbc 5.8; hgb 12.2; hct 38.5; mcv 88.7; plt 345; glucose 99; bun 8; creat 0.88; k+ 3.4; na++ 143; ca 8.5; liver normal albumin 3.1     Review of Systems  Constitutional: Negative for malaise/fatigue.  Respiratory: Negative for cough and shortness of breath.   Cardiovascular: Negative for chest pain, palpitations and leg swelling.  Gastrointestinal: Negative for abdominal pain, constipation and heartburn.  Musculoskeletal: Negative for back pain, joint pain and myalgias.  Skin: Negative.   Neurological: Negative for dizziness.       Has weakness present   Psychiatric/Behavioral: The patient is not nervous/anxious.       Physical Exam  Constitutional: He is oriented to person, place, and time. He appears well-developed and well-nourished. No distress.  Neck: No thyromegaly present.  Cardiovascular: Normal rate, regular rhythm and intact distal pulses.  Murmur heard. 1/6  Pulmonary/Chest: Effort normal and breath sounds normal. No respiratory distress.  Abdominal: Soft. Bowel sounds are normal. He exhibits no distension. There is no tenderness.  Musculoskeletal: He exhibits no edema.  Is able to move all extremities Has mild weakness present   Lymphadenopathy:    He has no cervical adenopathy.  Neurological: He is alert and oriented to person, place, and time.  Has mild confusion present   Skin: Skin is warm and dry. He is not diaphoretic.  Psychiatric: He has a normal mood and affect.     ASSESSMENT/ PLAN:  TODAY:   1. Acute ischemic left MCA stroke: is neurologically stable will continue asa 81 mg daily   2. PAD: is without change in status: will  continue asa 81 mg daily   3. Essential hypertension: stable b/p 122/77: will continue lisinopril 40 mg daily   4. Dyslipidemia: stable will continue lipitor 40 mg daily   5. Physical deconditioning: will continue therapy as directed to improve his level independence; coordination; balance and adl training.   6. Protein calorie malnutrition: albumin 3.1; will continue ensure twice daily   7. Vascular dementia without behavioral disturbance: no change in status: will not make changes will monitor    Will check bmp     MD is aware of resident's narcotic use and is in agreement with current plan of care. We will attempt to wean resident as apropriate   Ok Edwards NP [facility]  Contact  [phone] Monday through Friday 8am- 5pm  After hours call [phone]  This encounter was created in error - please disregard.

## 2017-08-16 ENCOUNTER — Non-Acute Institutional Stay (SKILLED_NURSING_FACILITY): Payer: Medicare Other | Admitting: Internal Medicine

## 2017-08-16 ENCOUNTER — Encounter: Payer: Self-pay | Admitting: Internal Medicine

## 2017-08-16 DIAGNOSIS — F0150 Vascular dementia without behavioral disturbance: Secondary | ICD-10-CM | POA: Diagnosis not present

## 2017-08-16 DIAGNOSIS — I739 Peripheral vascular disease, unspecified: Secondary | ICD-10-CM

## 2017-08-16 DIAGNOSIS — I69320 Aphasia following cerebral infarction: Secondary | ICD-10-CM

## 2017-08-16 DIAGNOSIS — I10 Essential (primary) hypertension: Secondary | ICD-10-CM | POA: Diagnosis not present

## 2017-08-16 DIAGNOSIS — E785 Hyperlipidemia, unspecified: Secondary | ICD-10-CM | POA: Diagnosis not present

## 2017-08-16 LAB — BASIC METABOLIC PANEL
BUN: 14 (ref 4–21)
Creatinine: 0.6 (ref 0.6–1.3)
GLUCOSE: 81
POTASSIUM: 4.5 (ref 3.4–5.3)
Sodium: 142 (ref 137–147)

## 2017-08-16 NOTE — Progress Notes (Signed)
Patient ID: [name], male   DOB: 1929/10/11, 84 y.o.   MRN: [id number]  Provider:  DR Arletha Grippe Location:  [facility] Room Number: 125 A Place of Service:  SNF (31)  PCP: Patient, [name] Patient Care Team: Patient, [name] as PCP - General (General Practice) Lowella Bandy, MD as Attending Physician (Urology)  Extended Emergency Contact Information Primary Emergency Contact: Jeronimo Greaves [place] Phone: [phone] Relation: Friend Secondary Emergency Contact: DumonJeneen Montgomery Mobile Phone: [phone] Relation: Niece Preferred language: English Interpreter needed? No  Code Status: DNR Goals of Care: Advanced Directive information Advanced Directives 08/16/2017  Does Patient Have a Medical Advance Directive? Yes  Type of Advance Directive Out of facility DNR (pink MOST or yellow form)  Does patient want to make changes to medical advance directive? No - Patient declined  Would patient like information on creating a medical advance directive? No - Patient declined  Pre-existing out of facility DNR order (yellow form or pink MOST form) Yellow form placed in chart (order not valid for inpatient use)      Chief Complaint  Patient presents with  . New Admit To SNF    Admission    HPI: Patient is a 84 y.o. male seen today for admission to SNF following hospital stay for acute left MCA CVA, syncope and collapse, right inguinal hernia, HTN, claudication, PAD. Initial CT head showed prior stroke with age-indeterminate infarct. MRI brain then revealed subacute/acute infarct in left MCA territory. 2D echo with NWMA or acute valvular changes; EF preserved. B/l carotid study with 1-39% stenosis of b/l ICA. Neurology consulted and medical tx continued with ASA, high intensity statin. Cognitive impairment thought to be related to vascular dementia in light of prior strokes. He rec'd PT (epley maneuver) for left posterior canal BPPV. ABIs  showed moderate b/l LE arterial disease. Vascular sx consulted but no acute surgical intervention recommended. Distal pedal pulses found by doppler. Staples x 4 placed in posterior scalp for laceration 2/2 fall. Hernia remained reducible. Albumin 3.1; hgb 12.2 ar d/c. He presents to SNF for short term rehab  Today he reports c/a poor memory, speech issues and difficulty walking. He is working with PT/OT/ST. He is a poor historian due to memory loss and expressive aphasia. Hx obtained from chart.    Past Medical History:  Diagnosis Date  . BPH (benign prostatic hyperplasia)   . Cataract   . Exertional dyspnea    with exertion  . Hypertension   . Mitral valvular regurgitation June 2014   Mild to moderate MR on echocardiogram  . PAD (peripheral artery disease) ([place])  December 2014   Lower extremity Dopplers/ABIs: RABI 0.29, LABI 0.66; bilateral external iliacs with significant diameter reduction; R. SFA occlusive disease throughout into popliteal artery with 0 vessel runoff. Anterior tibial reconstitutes distally. 70-99% reduction in all SFA. One-vessel runoff (anterior tibial)  . Stroke [facility])    Past Surgical History:  Procedure Laterality Date  . ABDOMINAL ANGIOGRAM  07/07/2013   Procedure: ABDOMINAL ANGIOGRAM;  Surgeon: Lorretta Harp, MD;  Location: [facility] CATH LAB;  Service: Cardiovascular;;  . CHOLECYSTECTOMY  07/04/2010  . ERCP N/A 11/27/2012   Procedure: ENDOSCOPIC RETROGRADE CHOLANGIOPANCREATOGRAPHY (ERCP);  Surgeon: Inda Castle, MD;  Location: [facility];  Service: Endoscopy;  Laterality: N/A;  . EYE SURGERY Right   . LEFT HEART CATHETERIZATION WITH CORONARY ANGIOGRAM N/A 07/07/2013   Procedure: LEFT HEART CATHETERIZATION WITH CORONARY ANGIOGRAM;  Surgeon: Roderic Palau  Adora Fridge, MD;  Location: [facility] CATH LAB;  Service: Cardiovascular;  Laterality: N/A;  . LOWER EXTREMITY ANGIOGRAM N/A 07/07/2013   Procedure: LOWER EXTREMITY ANGIOGRAM;  Surgeon: Lorretta Harp, MD;  Location: [facility] CATH LAB;   Service: Cardiovascular;  Laterality: N/A;  . NM MYOVIEW LTD  December 2014   Apical thinning but no ischemia. EF 55%  . PROSTATE SURGERY     Dr. Lowella Bandy  . TRANSTHORACIC ECHOCARDIOGRAM  June 2014   Basal septal thickening with moderate LVH. EF 65%. Normal wall motion. Diastolic dysfunction/NOS. Aortic sclerosis without stenosis. Mild to moderate MR    reports that he quit smoking about 36 years ago. His smoking use included cigarettes. he has never used smokeless tobacco. He reports that he drinks about 2.4 oz of alcohol per week. He reports that he does not use drugs. Social History   Socioeconomic History  . Marital status: Single    Spouse name: Not on file  . Number of children: Not on file  . Years of education: Not on file  . Highest education level: Not on file  Social Needs  . Financial resource strain: Not on file  . Food insecurity - worry: Not on file  . Food insecurity - inability: Not on file  . Transportation needs - medical: Not on file  . Transportation needs - non-medical: Not on file  Occupational History  . Occupation: Retired    Fish farm manager: RETIRED  Tobacco Use  . Smoking status: Former Smoker    Types: Cigarettes    Last attempt to quit: 11/25/1980    Years since quitting: 36.7  . Smokeless tobacco: Never Used  Substance and Sexual Activity  . Alcohol use: Yes    Alcohol/week: 2.4 oz    Types: 4 Standard drinks or equivalent per week    Comment: beers  . Drug use: No  . Sexual activity: No  Other Topics Concern  . Not on file  Social History Narrative   He is originally from [place]. He has a history of long-standing tobacco use but quit several years ago. He also has a history of former alcohol abuse as well.   He is retired and currently single.    Functional Status Survey:    Family History  Problem Relation Age of Onset  . Diabetes Sister   . Cancer Sister   . Cancer Mother   . Cancer Father   . Cancer Sister     Health Maintenance    Topic Date Due  . Samul Dada  08/10/2027  . INFLUENZA VACCINE  Completed  . PNA vac Low Risk Adult  Completed    No Known Allergies  Outpatient Encounter Medications as of 08/16/2017  Medication Sig  . acetaminophen (TYLENOL) 325 MG tablet Take 2 tablets (650 mg total) by mouth every 6 (six) hours as needed for mild pain (or Fever >/= 101).  Marland Kitchen aspirin 81 MG EC tablet Take 1 tablet (81 mg total) by mouth daily.  Marland Kitchen atorvastatin (LIPITOR) 80 MG tablet Take 1 tablet (80 mg total) by mouth daily at 6 PM.  . ENSURE (ENSURE) Take 237 mLs by mouth 2 (two) times daily between meals.  Marland Kitchen lisinopril (PRINIVIL,ZESTRIL) 40 MG tablet Take 1 tablet (40 mg total) by mouth daily.  . [DISCONTINUED] feeding supplement, ENSURE ENLIVE, (ENSURE ENLIVE) LIQD Take 237 mLs by mouth 2 (two) times daily between meals. (Patient not taking: Reported on 08/16/2017)   No facility-administered encounter medications on file as of 08/16/2017.  Review of Systems  Unable to perform ROS: Other (expressive aphasia)    Vitals:   08/16/17 1049  BP: 127/77  Pulse: 71  Resp: 17  Temp: 98 F (36.7 C)  SpO2: 98%  Weight: 147 lb 8 oz (66.9 kg)  Height: 5\' 5"  (1.651 m)   Body mass index is 24.55 kg/m. Physical Exam  Constitutional: He appears well-developed and well-nourished.  Sitting in w/c in NAD  HENT:  Mouth/Throat: Oropharynx is clear and moist.  MMM; no oral thrush  Eyes: Pupils are equal, round, and reactive to light. No scleral icterus.  Neck: Neck supple. Carotid bruit is not present. No thyromegaly present.  Cardiovascular: Normal rate, regular rhythm and intact distal pulses. Exam reveals no gallop and no friction rub.  Murmur (1/6 SEM) heard. No distal LE edema. No calf TTP  Pulmonary/Chest: Effort normal. He has wheezes (left base with prolonged expiratory phase b/l). He has no rales. He exhibits no tenderness.  Abdominal: Soft. Normal appearance and bowel sounds are normal. He exhibits no  distension, no abdominal bruit, no pulsatile midline mass and no mass. There is no hepatomegaly. There is no tenderness. There is no rigidity, no rebound and no guarding. No hernia.  Musculoskeletal: He exhibits edema.  Lymphadenopathy:    He has no cervical adenopathy.  Neurological: He is alert. A cranial nerve deficit is present.  Right facial droop; expressive aphasia; intact grip strength and LE strength  Skin: Skin is warm and dry. No rash noted.  Psychiatric: He has a normal mood and affect. His behavior is normal. Thought content normal.    Labs reviewed: Basic Metabolic Panel: Recent Labs    11/22/16 0637 08/09/17 2032 08/09/17 2043 08/10/17 0548  NA 140 141 143 143  K 3.9 3.9 3.7 3.4*  CL 104 103 101 107  CO2 31 27  --  26  GLUCOSE 121* 118* 114* 99  BUN 15 9 11 8   CREATININE 0.88 0.92 0.80 0.88  CALCIUM 9.1 9.6  --  8.5*   Liver Function Tests: Recent Labs    10/10/16 1923 08/09/17 2032 08/10/17 0548  AST 16 21 12*  ALT 10* 9* 8*  ALKPHOS 97 95 82  BILITOT 0.9 1.4* 0.6  PROT 6.9 5.9* 5.3*  ALBUMIN 4.0 3.7 3.1*   No results for input(s): LIPASE, AMYLASE in the last 8760 hours. Recent Labs    08/09/17 2032  AMMONIA 26   CBC: Recent Labs    10/10/16 1923  11/22/16 0637 08/09/17 2032 08/09/17 2043 08/10/17 0548  WBC 8.2   < > 6.0 6.3  --  5.8  NEUTROABS 5.8  --   --  4.7  --   --   HGB 14.2   < > 12.7* 13.7 14.3 12.2*  HCT 44.3   < > 40.0 42.0 42.0 38.5*  MCV 89.1   < > 89.9 88.4  --  88.7  PLT 489*   < > 416* 384  --  345   < > = values in this interval not displayed.   Cardiac Enzymes: Recent Labs    08/10/17 0142 08/10/17 0548 08/10/17 1307  TROPONINI <0.03 <0.03 <0.03   BNP: Invalid input(s): POCBNP Lab Results  Component Value Date   HGBA1C 5.2 08/10/2017   Lab Results  Component Value Date   TSH 0.903 08/10/2017   No results found for: VITAMINB12 No results found for: FOLATE No results found for: IRON, TIBC,  FERRITIN  Imaging and Procedures obtained prior to SNF  admission: Dg Chest 2 View  Result Date: 08/09/2017 CLINICAL DATA:  Syncopal episode EXAM: CHEST - 2 VIEW COMPARISON:  07/01/2013 FINDINGS: Low lung volumes. Stable mild cardiomegaly with tortuous ectatic aorta and calcification. No acute consolidation or pleural effusion. No pneumothorax. Metallic fragments over the left lower chest. IMPRESSION: No active cardiopulmonary disease.  Stable mild cardiomegaly. Electronically Signed   By: Donavan Foil M.D.   On: 08/09/2017 20:01   Dg Pelvis 1-2 Views  Result Date: 08/09/2017 CLINICAL DATA:  Fall EXAM: PELVIS - 1-2 VIEW COMPARISON:  CT 02/25/2016 FINDINGS: SI joints are non widened. Pubic symphysis and rami appear intact. No definite acute displaced fracture or malalignment. Mild degenerative changes of both hips. Vascular calcifications. IMPRESSION: No definite acute osseous abnormality. Electronically Signed   By: Donavan Foil M.D.   On: 08/09/2017 20:02   Ct Head Wo Contrast  Result Date: 08/09/2017 CLINICAL DATA:  Altered level of consciousness. Witnessed syncopal episode hitting back of head. Small abrasion to back of head. EXAM: CT HEAD WITHOUT CONTRAST CT CERVICAL SPINE WITHOUT CONTRAST TECHNIQUE: Multidetector CT imaging of the head and cervical spine was performed following the standard protocol without intravenous contrast. Multiplanar CT image reconstructions of the cervical spine were also generated. COMPARISON:  None. FINDINGS: CT HEAD FINDINGS Brain: Sulcal and ventricular prominence consistent with superficial and central atrophy. Wedge-shaped zones of hypodensity involving the posterior parietal lobes, left greater than right without definite encephalomalacia. Findings may represent subacute to chronic watershed infarcts. Additional small tiny cortical infarcts near the vertex of the right parietal lobe. No intra-axial mass nor extra-axial fluid collection. Vascular: Moderate  atherosclerosis of the carotid siphons. No hyperdense vessel sign. Skull: Negative for fracture or focal osseous lesions. Sinuses/Orbits: Intact orbits and globes with bilateral lens replacements. Clear mastoids bilaterally. Minimal ethmoid sinus mucosal thickening. Other: Small left occipital scalp contusion with laceration. CT CERVICAL SPINE FINDINGS Alignment: Intact craniocervical relationship and atlantodental interval. Slight reversal cervical lordosis at C3 likely from patient positioning or muscle spasm. Skull base and vertebrae: No jumped or perched appearing facets. Intact occipital condyles. No fracture of the skull base. No acute fracture of the cervical spine. Mild prominence of Schmorl's nodes and vascular channels of the cervical spine simulating lytic disease. Soft tissues and spinal canal: No prevertebral fluid or swelling. No visible canal hematoma. Disc levels: Moderate to marked disc space narrowing at all levels of the cervical spine from C2 through T1. Associated mild bilateral uncovertebral joint osteoarthritis is noted. Upper chest: Negative. Other: Partially included lipoma along the posterior aspect of the left shoulder is seen deep to the subscapularis. This measures at least 5.3 x 2.2 cm on series 8, image 79. IMPRESSION: 1. Small left occipital scalp contusion and laceration without underlying skull fracture. 2. Cerebral atrophy with chronic small vessel ischemic disease. Wedge-shaped hypodensities in the posterior parietal lobes left greater than right without hemorrhage. These likely represent age indeterminate watershed infarcts or likely subacute to chronic. Additional small cortical infarcts near the vertex of brain involving the high parietal lobe. 3. Cervical spondylosis without acute cervical spine fracture. 4. Lipoma deep to the left subscapularis muscle measuring at least 5.3 x 2.2 cm on axial series 8, image 79. Electronically Signed   By: Ashley Royalty M.D.   On: 08/09/2017  20:44   Ct Cervical Spine Wo Contrast  Result Date: 08/09/2017 CLINICAL DATA:  Altered level of consciousness. Witnessed syncopal episode hitting back of head. Small abrasion to back of head. EXAM: CT HEAD WITHOUT  CONTRAST CT CERVICAL SPINE WITHOUT CONTRAST TECHNIQUE: Multidetector CT imaging of the head and cervical spine was performed following the standard protocol without intravenous contrast. Multiplanar CT image reconstructions of the cervical spine were also generated. COMPARISON:  None. FINDINGS: CT HEAD FINDINGS Brain: Sulcal and ventricular prominence consistent with superficial and central atrophy. Wedge-shaped zones of hypodensity involving the posterior parietal lobes, left greater than right without definite encephalomalacia. Findings may represent subacute to chronic watershed infarcts. Additional small tiny cortical infarcts near the vertex of the right parietal lobe. No intra-axial mass nor extra-axial fluid collection. Vascular: Moderate atherosclerosis of the carotid siphons. No hyperdense vessel sign. Skull: Negative for fracture or focal osseous lesions. Sinuses/Orbits: Intact orbits and globes with bilateral lens replacements. Clear mastoids bilaterally. Minimal ethmoid sinus mucosal thickening. Other: Small left occipital scalp contusion with laceration. CT CERVICAL SPINE FINDINGS Alignment: Intact craniocervical relationship and atlantodental interval. Slight reversal cervical lordosis at C3 likely from patient positioning or muscle spasm. Skull base and vertebrae: No jumped or perched appearing facets. Intact occipital condyles. No fracture of the skull base. No acute fracture of the cervical spine. Mild prominence of Schmorl's nodes and vascular channels of the cervical spine simulating lytic disease. Soft tissues and spinal canal: No prevertebral fluid or swelling. No visible canal hematoma. Disc levels: Moderate to marked disc space narrowing at all levels of the cervical spine from C2  through T1. Associated mild bilateral uncovertebral joint osteoarthritis is noted. Upper chest: Negative. Other: Partially included lipoma along the posterior aspect of the left shoulder is seen deep to the subscapularis. This measures at least 5.3 x 2.2 cm on series 8, image 79. IMPRESSION: 1. Small left occipital scalp contusion and laceration without underlying skull fracture. 2. Cerebral atrophy with chronic small vessel ischemic disease. Wedge-shaped hypodensities in the posterior parietal lobes left greater than right without hemorrhage. These likely represent age indeterminate watershed infarcts or likely subacute to chronic. Additional small cortical infarcts near the vertex of brain involving the high parietal lobe. 3. Cervical spondylosis without acute cervical spine fracture. 4. Lipoma deep to the left subscapularis muscle measuring at least 5.3 x 2.2 cm on axial series 8, image 79. Electronically Signed   By: Ashley Royalty M.D.   On: 08/09/2017 20:44   Mr Brain Wo Contrast  Result Date: 08/10/2017 CLINICAL DATA:  Syncopal episode with trauma to the back of the head. EXAM: MRI HEAD WITHOUT CONTRAST TECHNIQUE: Multiplanar, multiecho pulse sequences of the brain and surrounding structures were obtained without intravenous contrast. COMPARISON:  CT 08/09/2017 FINDINGS: Brain: Diffusion imaging confirms acute/subacute infarction in the left MCA territory affecting the posterior left temporal lobe and temporoparietal junction region. Few scattered separate infarctions in the deep white matter of the left hemisphere. No acute infarction affecting the brainstem, cerebellum or right cerebral hemisphere. Mild swelling of the affected region. Minimal petechial blood products in the region of infarction. Susceptibility artifact within thrombosed vessels but no sign of parenchymal hematoma. There are 2 old small right parietal cortical and subcortical infarctions. There chronic small-vessel ischemic changes  throughout the hemispheric white matter. No hydrocephalus. No mass lesion. No extra-axial collection. Vascular: Major vessels at the base of the brain show flow. Skull and upper cervical spine: Negative Sinuses/Orbits: Clear/normal Other: None IMPRESSION: Acute/subacute infarction in the posterior portion of the left MCA territory affecting the posterior temporal lobe and temporoparietal junction region. Few other scattered punctate acute infarctions in the left temporal lobe and left hemispheric deep white matter. Findings most consistent with  embolic disease to the left MCA territory. Some component of watershed infarction may be present. Petechial blood products in the region of infarction with visible thrombosed vessel on the gradient echo imaging. No frank parenchymal hematoma. No mass effect or shift. Old ischemic changes elsewhere throughout the brain as outlined above. Electronically Signed   By: Nelson Chimes M.D.   On: 08/10/2017 08:05    Assessment/Plan   ICD-10-CM   1. Aphasia due to recent stroke I69.320   2. PAD (peripheral artery disease) (HCC) I73.9   3. Essential hypertension I10   4. Dyslipidemia E78.5   5. Vascular dementia without behavioral disturbance F01.50     Cont current med as ordered  PT/OT/ST as ordered  F/u with neurology as scheduled  Nutritional supplements as indicated  GOAL: short term rehab then home vs ALF at d/c. Communicated with pt and nursing.  Labs/tests ordered: none  Etheridge Geil S. Perlie Gold  [facility] [address] [place], [place] [address] [phone] Cell (Monday-Friday 8 AM - 5 PM) [phone] After 5 PM and follow prompts

## 2017-08-17 ENCOUNTER — Encounter: Payer: Self-pay | Admitting: Adult Health

## 2017-08-17 ENCOUNTER — Telehealth: Payer: Self-pay | Admitting: Neurology

## 2017-08-17 NOTE — Telephone Encounter (Signed)
Tamika @ [facility] to sched an apt for pt..Per ED notes, pt should follow up w/ Dr. Leonie Man in 1-2 weeks. He was not seen by any of our doctors in the hospital. Is there a place where we should fit him in?

## 2017-08-17 NOTE — Progress Notes (Signed)
Location:    [facility] Room Number: 125 A Place of Service:  SNF (31)   CODE STATUS: DNR  No Known Allergies  Chief Complaint  Patient presents with  . Hospitalization Follow-up    Hospital Follow up    HPI:   He is a 84 year old man; how lives independently; who was hospitalized for an acute left MCA stroke. He had a fall at a bus stop. He is having some confusion present. There is concern for underlying dementia present. He is here for short term rehab. More than likely he will need long term placement in either assisted living or SNF. He denies any pain; no headaches; no changes in appetite; no insomnia present. He will continue to be followed for his chronic illnesses including: hypertension; dyslipidemia; and hypertension. There are no nursing concerns at this time.         Past Medical History:  Diagnosis Date  . BPH (benign prostatic hyperplasia)   . Cataract   . Exertional dyspnea    with exertion  . Hypertension   . Mitral valvular regurgitation June 2014   Mild to moderate MR on echocardiogram  . PAD (peripheral artery disease) ([place])  December 2014   Lower extremity Dopplers/ABIs: RABI 0.29, LABI 0.66; bilateral external iliacs with significant diameter reduction; R. SFA occlusive disease throughout into popliteal artery with 0 vessel runoff. Anterior tibial reconstitutes distally. 70-99% reduction in all SFA. One-vessel runoff (anterior tibial)  . Stroke [facility])          Past Surgical History:  Procedure Laterality Date  . ABDOMINAL ANGIOGRAM  07/07/2013   Procedure: ABDOMINAL ANGIOGRAM;  Surgeon: Lorretta Harp, MD;  Location: [facility] CATH LAB;  Service: Cardiovascular;;  . CHOLECYSTECTOMY  07/04/2010  . ERCP N/A 11/27/2012   Procedure: ENDOSCOPIC RETROGRADE CHOLANGIOPANCREATOGRAPHY (ERCP);  Surgeon: Inda Castle, MD;  Location: [facility];  Service: Endoscopy;  Laterality: N/A;  . EYE SURGERY Right   . LEFT HEART  CATHETERIZATION WITH CORONARY ANGIOGRAM N/A 07/07/2013   Procedure: LEFT HEART CATHETERIZATION WITH CORONARY ANGIOGRAM;  Surgeon: Lorretta Harp, MD;  Location: [facility] CATH LAB;  Service: Cardiovascular;  Laterality: N/A;  . LOWER EXTREMITY ANGIOGRAM N/A 07/07/2013   Procedure: LOWER EXTREMITY ANGIOGRAM;  Surgeon: Lorretta Harp, MD;  Location: [facility] CATH LAB;  Service: Cardiovascular;  Laterality: N/A;  . NM MYOVIEW LTD  December 2014   Apical thinning but no ischemia. EF 55%  . PROSTATE SURGERY     Dr. Lowella Bandy  . TRANSTHORACIC ECHOCARDIOGRAM  June 2014   Basal septal thickening with moderate LVH. EF 65%. Normal wall motion. Diastolic dysfunction/NOS. Aortic sclerosis without stenosis. Mild to moderate MR    Social History        Socioeconomic History  . Marital status: Single    Spouse name: Not on file  . Number of children: Not on file  . Years of education: Not on file  . Highest education level: Not on file  Social Needs  . Financial resource strain: Not on file  . Food insecurity - worry: Not on file  . Food insecurity - inability: Not on file  . Transportation needs - medical: Not on file  . Transportation needs - non-medical: Not on file  Occupational History  . Occupation: Retired    Fish farm manager: RETIRED  Tobacco Use  . Smoking status: Former Smoker    Types: Cigarettes    Last attempt to quit: 11/25/1980    Years since quitting: 36.7  .  Smokeless tobacco: Never Used  Substance and Sexual Activity  . Alcohol use: Yes    Alcohol/week: 2.4 oz    Types: 4 Standard drinks or equivalent per week    Comment: beers  . Drug use: No  . Sexual activity: No  Other Topics Concern  . Not on file  Social History Narrative   He is originally from [place]. He has a history of long-standing tobacco use but quit several years ago. He also has a history of former alcohol abuse as well.   He is retired and currently single.        Family History    Problem Relation Age of Onset  . Diabetes Sister   . Cancer Sister   . Cancer Mother   . Cancer Father   . Cancer Sister       VITAL SIGNS BP 122/77   Pulse 71   Temp 98 F (36.7 C)   Resp 17   Ht 5\' 5"  (1.651 m)   Wt 147 lb 8 oz (66.9 kg)   SpO2 98%   BMI 24.55 kg/m       Outpatient Encounter Medications as of 08/15/2017  Medication Sig  . acetaminophen (TYLENOL) 325 MG tablet Take 2 tablets (650 mg total) by mouth every 6 (six) hours as needed for mild pain (or Fever >/= 101).  Marland Kitchen aspirin 81 MG EC tablet Take 1 tablet (81 mg total) by mouth daily.  Marland Kitchen atorvastatin (LIPITOR) 80 MG tablet Take 1 tablet (80 mg total) by mouth daily at 6 PM.  . feeding supplement, ENSURE ENLIVE, (ENSURE ENLIVE) LIQD Take 237 mLs by mouth 2 (two) times daily between meals.  Marland Kitchen lisinopril (PRINIVIL,ZESTRIL) 40 MG tablet Take 1 tablet (40 mg total) by mouth daily.   No facility-administered encounter medications on file as of 08/15/2017.      SIGNIFICANT DIAGNOSTIC EXAMS   TODAY:   08-09-17: chest x-ray:No active cardiopulmonary disease. Stable mild cardiomegaly.  08-09-17: pelvic x-ray:No definite acute osseous abnormality  08-09-17: ct of head and cervical spine:1. Small left occipital scalp contusion and laceration without underlying skull fracture. 2. Cerebral atrophy with chronic small vessel ischemic disease. Wedge-shaped hypodensities in the posterior parietal lobes left greater than right without hemorrhage. These likely represent age indeterminate watershed infarcts or likely subacute to chronic. Additional small cortical infarcts near the vertex of brain involving the high parietal lobe. 3. Cervical spondylosis without acute cervical spine fracture. 4. Lipoma deep to the left subscapularis muscle measuring at least 5.3 x 2.2 cm on axial series 8, image 79.  08-10-17: MRI of brain:Acute/subacute infarction in the posterior portion of the left MCA territory affecting  the posterior temporal lobe and temporoparietal junction region. Few other scattered punctate acute infarctions in the left temporal lobe and left hemispheric deep white matter. Findings most consistent with embolic disease to the left MCA territory. Some component of watershed infarction may be present. Petechial blood products in the region of infarction with visible thrombosed vessel on the gradient echo imaging. No frank parenchymal hematoma. No mass effect or shift. Old ischemic changes elsewhere throughout the brain as outlined Above.  08-10-17: 2-d echo:- Left ventricle: The cavity size was normal. Wall thickness was increased in a pattern of mild LVH. There was moderate focal basal hypertrophy of the septum. Systolic function was normal.The estimated ejection fraction was in the range of 60% to 65%. Doppler parameters are consistent with abnormal left ventricular relaxation (grade 1 diastolic dysfunction). The E/e&' ratio is between  8-15, suggesting indeterminate LV filling pressure. - Aortic valve: Trileaflet. Sclerosis without stenosis. There was no regurgitation. - Mitral valve: Mildly thickened leaflets . There was trivial regurgitation. - Left atrium: Moderately dilated. - Right ventricle: The cavity size was normal. Wall thickness was normal. Systolic function was normal. Lateral annulus peak S velocity: 14.3 cm/s. - Tricuspid valve: There was mild regurgitation. - Pulmonary arteries: PA peak pressure: 29 mm Hg (S). - Inferior vena cava: The vessel was normal in size. The respirophasic diameter changes were in the normal range (>= 50%), consistent with normal central venous pressure.  08-10-17: carotid doppler:Right Carotid: Velocities in the right ICA are consistent with a 1-39% stenosis. Left Carotid: Velocities in the left ICA are consistent with a 1-39% stenosis.  08-11-17: bilateral XHB:ZJIRC: Resting right ankle-brachial index indicates moderate right lower extremity arterial  disease. The right toe-brachial index is abnormal. ABIs are unreliable. Left: Resting left ankle-brachial index indicates moderate left lower extremity arterial disease. The left toe-brachial index is abnormal. ABIs are unreliable.   LABS REVIEWED:   08-09-17: wbc 6.3; hgb 13.7; hct 42.0; mcv 88.4 plt 384; glucose 118; bun 9; creat 0.92; k+ 3.9; na++ 141; ca 9.6; total bili 1.4; albumin 3.7 08-10-17: wbc 5.8; hgb 12.2; hct 38.5; mcv 88.7; plt 345; glucose 99; bun 8; creat 0.88; k+ 3.4; na++ 143; ca 8.5; liver normal albumin 3.1    Review of Systems  Constitutional: Negative formalaise/fatigue.  Respiratory: Negative forcoughand shortness of breath.  Cardiovascular: Negative forchest pain,palpitationsand leg swelling.  Gastrointestinal: Negative forabdominal pain,constipationand heartburn.  Musculoskeletal: Negative forback pain,joint painand myalgias.  Skin:Negative.  Neurological: Negative fordizziness. Has weakness present Psychiatric/Behavioral: The patientis not nervous/anxious.    Physical Exam Constitutional: He isoriented to person, place, and time. He appearswell-developedand well-nourished.No distress.  Neck:No thyromegalypresent.  Cardiovascular:Normal rate,regular rhythmand intact distal pulses. Murmurheard. 1/6 Pulmonary/Chest:Effort normaland breath sounds normal. Norespiratory distress.  Abdominal:Soft.Bowel sounds are normal. He exhibitsno distension. There isno tenderness.  Musculoskeletal: He exhibits noedema. Is able to move all extremities Has mild weakness present Lymphadenopathy:  He has no cervical adenopathy.  Neurological: He isalertand oriented to person, place, and time. Has mild confusion present Skin: Skin iswarmand dry. He isnot diaphoretic.  Psychiatric: He has anormal mood and affect.    ASSESSMENT/ PLAN:  TODAY:   1. Acute ischemic left MCA stroke: is  neurologically stable will continue asa 81 mg daily   2. PAD: is without change in status: will continue asa 81 mg daily   3. Essential hypertension: stable b/p 122/77: will continue lisinopril 40 mg daily   4. Dyslipidemia: stable will continue lipitor 40 mg daily   5. Physical deconditioning: will continue therapy as directed to improve his level independence; coordination; balance and adl training.   6. Protein calorie malnutrition: albumin 3.1; will continue ensure twice daily   7. Vascular dementia without behavioral disturbance: no change in status: will not make changes will monitor    Will check bmp      MD is aware of resident's narcotic use and is in agreement with current plan of care. We will attempt to wean resident as apropriate   Ok Edwards NP [facility]  Contact [phone] Monday through Friday 8am- 5pm  After hours call [phone]

## 2017-08-17 NOTE — Telephone Encounter (Signed)
Rn spoke with [facility] in referrals if Dr.SEthi is not available pt can see Dr.Xu within a week. Lexine Baton will speak with Tamika or patients family.

## 2017-08-22 ENCOUNTER — Ambulatory Visit: Payer: Medicare Other | Admitting: Neurology

## 2017-08-22 ENCOUNTER — Encounter: Payer: Self-pay | Admitting: Neurology

## 2017-08-22 ENCOUNTER — Telehealth: Payer: Self-pay | Admitting: Neurology

## 2017-08-22 VITALS — BP 140/75 | HR 72 | Ht 65.0 in | Wt 154.4 lb

## 2017-08-22 DIAGNOSIS — I739 Peripheral vascular disease, unspecified: Secondary | ICD-10-CM

## 2017-08-22 DIAGNOSIS — I63512 Cerebral infarction due to unspecified occlusion or stenosis of left middle cerebral artery: Secondary | ICD-10-CM | POA: Diagnosis not present

## 2017-08-22 DIAGNOSIS — I10 Essential (primary) hypertension: Secondary | ICD-10-CM

## 2017-08-22 NOTE — Telephone Encounter (Signed)
[employer] medicare order sent to [facility] no auth they will contact the patient to schedule.

## 2017-08-22 NOTE — Patient Instructions (Addendum)
-   recommend to increase ASA from 81 to 325mg  daily - recommend to decrease lipitor for 80 to 20mg  daily - will do MRA head to evaluate the brain vessels - will do 30 day cardiac event monitoring to rule out Afib - follow up with physician in rehab to remove staples on the scalp - Follow up with your primary care physician for stroke risk factor modification. Recommend maintain blood pressure goal <130/80, diabetes with hemoglobin A1c goal below 7.0% and lipids with LDL cholesterol goal below 70 mg/dL.  - check BP at facility once a day and record - continue PT/OT in the facility - follow up in 2 months with me.

## 2017-08-23 ENCOUNTER — Non-Acute Institutional Stay: Payer: Medicare Other | Admitting: Adult Health

## 2017-08-23 ENCOUNTER — Encounter: Payer: Self-pay | Admitting: Adult Health

## 2017-08-23 DIAGNOSIS — I63512 Cerebral infarction due to unspecified occlusion or stenosis of left middle cerebral artery: Secondary | ICD-10-CM | POA: Diagnosis not present

## 2017-08-23 DIAGNOSIS — F0150 Vascular dementia without behavioral disturbance: Secondary | ICD-10-CM

## 2017-08-23 DIAGNOSIS — R5381 Other malaise: Secondary | ICD-10-CM

## 2017-08-23 NOTE — Progress Notes (Signed)
Location:   [facility] Room Number: 125 a Place of Service:  SNF (31)   CODE STATUS:  DNR  No Known Allergies  Chief Complaint  Patient presents with  . Acute Visit    Care Plan Meeting    HPI:  We have come together for his care plan meeting. He does have family present. The goals of his care is to go to assisted living. He is agreeable to going to assisted living. He has poor peripheral vision; he is on the eye doctor list, his family states that he needs cataract surgery. His family has expressed concerns about possible depression. He is spending all of his time in his room; which is not his normal. He is normally a sociable person. He is ambulatory. He denies any pain; no changes in appetite; denies any anxiety or depression. There are no nursing concerns at this time.    Past Medical History:  Diagnosis Date  . BPH (benign prostatic hyperplasia)   . Cataract   . Exertional dyspnea    with exertion  . Hypertension   . Mitral valvular regurgitation June 2014   Mild to moderate MR on echocardiogram  . PAD (peripheral artery disease) ([place])  December 2014   Lower extremity Dopplers/ABIs: RABI 0.29, LABI 0.66; bilateral external iliacs with significant diameter reduction; R. SFA occlusive disease throughout into popliteal artery with 0 vessel runoff. Anterior tibial reconstitutes distally. 70-99% reduction in all SFA. One-vessel runoff (anterior tibial)  . Stroke [facility])     Past Surgical History:  Procedure Laterality Date  . ABDOMINAL ANGIOGRAM  07/07/2013   Procedure: ABDOMINAL ANGIOGRAM;  Surgeon: Lorretta Harp, MD;  Location: [facility] CATH LAB;  Service: Cardiovascular;;  . CHOLECYSTECTOMY  07/04/2010  . ERCP N/A 11/27/2012   Procedure: ENDOSCOPIC RETROGRADE CHOLANGIOPANCREATOGRAPHY (ERCP);  Surgeon: Inda Castle, MD;  Location: [facility];  Service: Endoscopy;  Laterality: N/A;  . EYE SURGERY Right   . LEFT HEART CATHETERIZATION WITH CORONARY ANGIOGRAM  N/A 07/07/2013   Procedure: LEFT HEART CATHETERIZATION WITH CORONARY ANGIOGRAM;  Surgeon: Lorretta Harp, MD;  Location: [facility] CATH LAB;  Service: Cardiovascular;  Laterality: N/A;  . LOWER EXTREMITY ANGIOGRAM N/A 07/07/2013   Procedure: LOWER EXTREMITY ANGIOGRAM;  Surgeon: Lorretta Harp, MD;  Location: [facility] CATH LAB;  Service: Cardiovascular;  Laterality: N/A;  . NM MYOVIEW LTD  December 2014   Apical thinning but no ischemia. EF 55%  . PROSTATE SURGERY     Dr. Lowella Bandy  . TRANSTHORACIC ECHOCARDIOGRAM  June 2014   Basal septal thickening with moderate LVH. EF 65%. Normal wall motion. Diastolic dysfunction/NOS. Aortic sclerosis without stenosis. Mild to moderate MR    Social History   Socioeconomic History  . Marital status: Single    Spouse name: Not on file  . Number of children: Not on file  . Years of education: Not on file  . Highest education level: Not on file  Occupational History  . Occupation: Retired    Fish farm manager: RETIRED  Social Needs  . Financial resource strain: Not on file  . Food insecurity:    Worry: Not on file    Inability: Not on file  . Transportation needs:    Medical: Not on file    Non-medical: Not on file  Tobacco Use  . Smoking status: Former Smoker    Types: Cigarettes    Last attempt to quit: 11/25/1980    Years since quitting: 36.7  . Smokeless tobacco: Never Used  Substance  and Sexual Activity  . Alcohol use: Yes    Alcohol/week: 2.4 oz    Types: 4 Standard drinks or equivalent per week    Comment: beers  . Drug use: No  . Sexual activity: Never  Lifestyle  . Physical activity:    Days per week: Not on file    Minutes per session: Not on file  . Stress: Not on file  Relationships  . Social connections:    Talks on phone: Not on file    Gets together: Not on file    Attends religious service: Not on file    Active member of club or organization: Not on file    Attends meetings of clubs or organizations: Not on file    Relationship  status: Not on file  . Intimate partner violence:    Fear of current or ex partner: Not on file    Emotionally abused: Not on file    Physically abused: Not on file    Forced sexual activity: Not on file  Other Topics Concern  . Not on file  Social History Narrative   He is originally from [place]. He has a history of long-standing tobacco use but quit several years ago. He also has a history of former alcohol abuse as well.   He is retired and currently single.   Family History  Problem Relation Age of Onset  . Diabetes Sister   . Cancer Sister   . Cancer Mother   . Cancer Father   . Cancer Sister       VITAL SIGNS BP 135/74   Pulse 77   Temp (!) 97.4 F (36.3 C)   Resp 18   Ht 5\' 5"  (1.651 m)   Wt 152 lb 6.4 oz (69.1 kg)   SpO2 98%   BMI 25.36 kg/m   Outpatient Encounter Medications as of 08/23/2017  Medication Sig  . acetaminophen (TYLENOL) 325 MG tablet Take 2 tablets (650 mg total) by mouth every 6 (six) hours as needed for mild pain (or Fever >/= 101).  Marland Kitchen aspirin 81 MG EC tablet Take 1 tablet (81 mg total) by mouth daily.  Marland Kitchen atorvastatin (LIPITOR) 80 MG tablet Take 1 tablet (80 mg total) by mouth daily at 6 PM.  . ENSURE (ENSURE) Take 237 mLs by mouth 2 (two) times daily between meals.  Marland Kitchen lisinopril (PRINIVIL,ZESTRIL) 40 MG tablet Take 1 tablet (40 mg total) by mouth daily.   No facility-administered encounter medications on file as of 08/23/2017.      SIGNIFICANT DIAGNOSTIC EXAMS  PREVIOUS:   08-09-17: chest x-ray:No active cardiopulmonary disease. Stable mild cardiomegaly.  08-09-17: pelvic x-ray:No definite acute osseous abnormality  08-09-17: ct of head and cervical spine:1. Small left occipital scalp contusion and laceration without underlying skull fracture. 2. Cerebral atrophy with chronic small vessel ischemic disease. Wedge-shaped hypodensities in the posterior parietal lobes left greater than right without hemorrhage. These likely represent  age indeterminate watershed infarcts or likely subacute to chronic. Additional small cortical infarcts near the vertex of brain involving the high parietal lobe. 3. Cervical spondylosis without acute cervical spine fracture. 4. Lipoma deep to the left subscapularis muscle measuring at least 5.3 x 2.2 cm on axial series 8, image 79.  08-10-17: MRI of brain:Acute/subacute infarction in the posterior portion of the left MCA territory affecting the posterior temporal lobe and temporoparietal junction region. Few other scattered punctate acute infarctions in the left temporal lobe and left hemispheric deep white matter. Findings most consistent  with embolic disease to the left MCA territory. Some component of watershed infarction may be present. Petechial blood products in the region of infarction with visible thrombosed vessel on the gradient echo imaging. No frank parenchymal hematoma. No mass effect or shift. Old ischemic changes elsewhere throughout the brain as outlined Above.  08-10-17: 2-d echo:- Left ventricle: The cavity size was normal. Wall thickness was increased in a pattern of mild LVH. There was moderate focal basal hypertrophy of the septum. Systolic function was normal.The estimated ejection fraction was in the range of 60% to 65%. Doppler parameters are consistent with abnormal left ventricular relaxation (grade 1 diastolic dysfunction). The E/e&' ratio is between 8-15, suggesting indeterminate LV filling pressure. - Aortic valve: Trileaflet. Sclerosis without stenosis. There was no regurgitation. - Mitral valve: Mildly thickened leaflets . There was trivial regurgitation. - Left atrium: Moderately dilated. - Right ventricle: The cavity size was normal. Wall thickness was normal. Systolic function was normal. Lateral annulus peak S velocity: 14.3 cm/s. - Tricuspid valve: There was mild regurgitation. - Pulmonary arteries: PA peak pressure: 29 mm Hg (S). - Inferior vena cava: The vessel  was normal in size. The respirophasic diameter changes were in the normal range (>= 50%), consistent with normal central venous pressure.  08-10-17: carotid doppler:Right Carotid: Velocities in the right ICA are consistent with a 1-39% stenosis. Left Carotid: Velocities in the left ICA are consistent with a 1-39% stenosis.  08-11-17: bilateral VEL:FYBOF: Resting right ankle-brachial index indicates moderate right lower extremity arterial disease. The right toe-brachial index is abnormal. ABIs are unreliable. Left: Resting left ankle-brachial index indicates moderate left lower extremity arterial disease. The left toe-brachial index is abnormal. ABIs are unreliable.   NO NEW EXAMS   LABS REVIEWED: PREVIOUS   08-09-17: wbc 6.3; hgb 13.7; hct 42.0; mcv 88.4 plt 384; glucose 118; bun 9; creat 0.92; k+ 3.9; na++ 141; ca 9.6; total bili 1.4; albumin 3.7 08-10-17: wbc 5.8; hgb 12.2; hct 38.5; mcv 88.7; plt 345; glucose 99; bun 8; creat 0.88; k+ 3.4; na++ 143; ca 8.5; liver normal albumin 3.1  TODAY:   08-16-17: glucose 81; bun 13.7; creat 0.62; k+ 4.5; na++ 142; ca 9.0    Review of Systems  Constitutional: Negative for malaise/fatigue.  Respiratory: Negative for cough and shortness of breath.   Cardiovascular: Negative for chest pain, palpitations and leg swelling.  Gastrointestinal: Negative for abdominal pain, constipation and heartburn.  Musculoskeletal: Negative for back pain, joint pain and myalgias.  Skin: Negative.   Neurological: Negative for dizziness.  Psychiatric/Behavioral: The patient is not nervous/anxious.     Physical Exam  Constitutional: He is oriented to person, place, and time. He appears well-developed and well-nourished. No distress.  Neck: No thyromegaly present.  Cardiovascular: Normal rate, regular rhythm and intact distal pulses.  Murmur heard. 1/6  Pulmonary/Chest: Effort normal and breath sounds normal. No respiratory distress.  Abdominal: Soft. Bowel  sounds are normal. He exhibits no distension. There is no tenderness.  Musculoskeletal: He exhibits no edema.  Is able to move all extremities Has mild weakness present  Lymphadenopathy:    He has no cervical adenopathy.  Neurological: He is alert and oriented to person, place, and time.  Has mild confusion present   Skin: Skin is warm and dry. He is not diaphoretic.  Psychiatric: He has a normal mood and affect.      ASSESSMENT/ PLAN:  TODAY:   1. Acute ischemic left MCA stroke: 2. Physical deconditioning:  3. Vascular dementia without behavioral  disturbance:   Will continue his current plan of care Will continue therapy as directed  40 minutes spent with patient and family: discussed medical status; medications; discharge plans and goals. Verbalized understanding.   MD is aware of resident's narcotic use and is in agreement with current plan of care. We will attempt to wean resident as apropriate   Ok Edwards NP [facility]  Contact [phone] Monday through Friday 8am- 5pm  After hours call [phone]

## 2017-08-24 NOTE — Progress Notes (Signed)
NEUROLOGY CLINIC NEW CONSULT NOTE  NAME: Jaimin Krupka Wingerter DOB: Nov 07, 1929 REFERRING PHYSICIAN: No ref. provider found  I saw Nicolaas Savo Holcomb as a new consult in the neurovascular clinic today regarding  Chief Complaint  Patient presents with  . New Patient (Initial Visit)    Referral from hospital for Acute Stroke, PT was not seen by Dr.Ahern, Dr.Amoy Steeves or Dr. Leonie Man at the hospital. He is a new patient  .  HPI: NAITHEN RIVENBURG is a 84 y.o. male with PMH of hypertension, pulmonary hypertension, baseline dementia and PAD who presents as a new patient for a stroke  Patient came today to clinic with his neighbor and also his POA.  Patient cannot remember what happened that day but POA stated that apparently he has vision difficulties not able to drive, he went to bus stop to catch a bus but was found down at the bus stop. Admitted to [facility] on 08/09/17 for work up of syncope/collapse but MRI found to have left inferior MCA acute infarct as well as left MCA/ACA punctate infarcts. CUS and TTE unremarkable. LDL 53 and A1C 5.2. Cardiac work up unrevealing. Discharged with ASA 81mg  and Lipitor 80mg .  During the interval time, pt back to baseline. No recurrent stroke or syncope. He can not remember the event.  He has been following with cardiology for pulmonary hypertension, and PAD, so far stable.  BP today 140/75.  Denies palpitation, or heart racing.  Past Medical History:  Diagnosis Date  . BPH (benign prostatic hyperplasia)   . Cataract   . Exertional dyspnea    with exertion  . Hypertension   . Mitral valvular regurgitation June 2014   Mild to moderate MR on echocardiogram  . PAD (peripheral artery disease) ([place])  December 2014   Lower extremity Dopplers/ABIs: RABI 0.29, LABI 0.66; bilateral external iliacs with significant diameter reduction; R. SFA occlusive disease throughout into popliteal artery with 0 vessel runoff. Anterior tibial reconstitutes distally. 70-99% reduction in all SFA. One-vessel  runoff (anterior tibial)  . Stroke [facility])    Past Surgical History:  Procedure Laterality Date  . ABDOMINAL ANGIOGRAM  07/07/2013   Procedure: ABDOMINAL ANGIOGRAM;  Surgeon: Lorretta Harp, MD;  Location: [facility] CATH LAB;  Service: Cardiovascular;;  . CHOLECYSTECTOMY  07/04/2010  . ERCP N/A 11/27/2012   Procedure: ENDOSCOPIC RETROGRADE CHOLANGIOPANCREATOGRAPHY (ERCP);  Surgeon: Inda Castle, MD;  Location: [facility];  Service: Endoscopy;  Laterality: N/A;  . EYE SURGERY Right   . LEFT HEART CATHETERIZATION WITH CORONARY ANGIOGRAM N/A 07/07/2013   Procedure: LEFT HEART CATHETERIZATION WITH CORONARY ANGIOGRAM;  Surgeon: Lorretta Harp, MD;  Location: [facility] CATH LAB;  Service: Cardiovascular;  Laterality: N/A;  . LOWER EXTREMITY ANGIOGRAM N/A 07/07/2013   Procedure: LOWER EXTREMITY ANGIOGRAM;  Surgeon: Lorretta Harp, MD;  Location: [facility] CATH LAB;  Service: Cardiovascular;  Laterality: N/A;  . NM MYOVIEW LTD  December 2014   Apical thinning but no ischemia. EF 55%  . PROSTATE SURGERY     Dr. Lowella Bandy  . TRANSTHORACIC ECHOCARDIOGRAM  June 2014   Basal septal thickening with moderate LVH. EF 65%. Normal wall motion. Diastolic dysfunction/NOS. Aortic sclerosis without stenosis. Mild to moderate MR   Family History  Problem Relation Age of Onset  . Diabetes Sister   . Cancer Sister   . Cancer Mother   . Cancer Father   . Cancer Sister    Current Outpatient Medications  Medication Sig Dispense Refill  . acetaminophen (TYLENOL) 325 MG  tablet Take 2 tablets (650 mg total) by mouth every 6 (six) hours as needed for mild pain (or Fever >/= 101).    Marland Kitchen aspirin 81 MG EC tablet Take 1 tablet (81 mg total) by mouth daily.    Marland Kitchen atorvastatin (LIPITOR) 80 MG tablet Take 1 tablet (80 mg total) by mouth daily at 6 PM.    . ENSURE (ENSURE) Take 237 mLs by mouth 2 (two) times daily between meals.    Marland Kitchen lisinopril (PRINIVIL,ZESTRIL) 40 MG tablet Take 1 tablet (40 mg total) by mouth daily.  5   No current  facility-administered medications for this visit.    No Known Allergies Social History   Socioeconomic History  . Marital status: Single    Spouse name: Not on file  . Number of children: Not on file  . Years of education: Not on file  . Highest education level: Not on file  Occupational History  . Occupation: Retired    Fish farm manager: RETIRED  Social Needs  . Financial resource strain: Not on file  . Food insecurity:    Worry: Not on file    Inability: Not on file  . Transportation needs:    Medical: Not on file    Non-medical: Not on file  Tobacco Use  . Smoking status: Former Smoker    Types: Cigarettes    Last attempt to quit: 11/25/1980    Years since quitting: 36.7  . Smokeless tobacco: Never Used  Substance and Sexual Activity  . Alcohol use: Yes    Alcohol/week: 2.4 oz    Types: 4 Standard drinks or equivalent per week    Comment: beers  . Drug use: No  . Sexual activity: Never  Lifestyle  . Physical activity:    Days per week: Not on file    Minutes per session: Not on file  . Stress: Not on file  Relationships  . Social connections:    Talks on phone: Not on file    Gets together: Not on file    Attends religious service: Not on file    Active member of club or organization: Not on file    Attends meetings of clubs or organizations: Not on file    Relationship status: Not on file  . Intimate partner violence:    Fear of current or ex partner: Not on file    Emotionally abused: Not on file    Physically abused: Not on file    Forced sexual activity: Not on file  Other Topics Concern  . Not on file  Social History Narrative   He is originally from [place]. He has a history of long-standing tobacco use but quit several years ago. He also has a history of former alcohol abuse as well.   He is retired and currently single.    Review of Systems Full 14 system review of systems performed and notable only for those listed, all others are neg:    Constitutional:   Cardiovascular:  Ear/Nose/Throat:   Skin:  Eyes:   Respiratory:   Gastroitestinal:   Genitourinary:  Hematology/Lymphatic:   Endocrine:  Musculoskeletal:   Allergy/Immunology:   Neurological: Memory loss, confusion, slurred speech, dizziness Psychiatric:  Sleep:    Physical Exam  Vitals:   08/22/17 1251  BP: 140/75  Pulse: 72    General - Well nourished, well developed, in no apparent distress.  Ophthalmologic - fundi not visualized due to noncooperation.  Cardiovascular - Regular rate and rhythm.  Neck - supple, no  nuchal rigidity .  Head - 4 remaining staples at left vertex of the head  Mental Status -  Level of arousal and orientation to year and person were intact, but not orientated to place and month and age. Language including expression, naming, repetition, comprehension, reading, and writing was assessed and found intact. Fund of Knowledge was assessed and was impaired.  Cranial Nerves II - XII - II - Visual field intact OU. III, IV, VI - Extraocular movements intact. V - Facial sensation intact bilaterally. VII - Facial movement intact bilaterally. VIII - Hearing & vestibular intact bilaterally. X - Palate elevates symmetrically. XI - Chin turning & shoulder shrug intact bilaterally. XII - Tongue protrusion intact.  Motor Strength - The patient's strength was normal in all extremities and pronator drift was absent.  Bulk was normal and fasciculations were absent.   Motor Tone - Muscle tone was assessed at the neck and appendages and was normal.  Reflexes - The patient's reflexes were normal in all extremities and he had no pathological reflexes.  Sensory - Light touch, temperature/pinprick were assessed and were normal.    Coordination - The patient had normal movements in the hands with no ataxia or dysmetria.  Tremor was absent.  Gait and Station - walk with walker, slow and small stride   Imaging  I have personally  reviewed the radiological images below and agree with the radiology interpretations.  Ct Head Wo Contrast 08/09/2017 IMPRESSION: 1. Small left occipital scalp contusion and laceration without underlying skull fracture. 2. Cerebral atrophy with chronic small vessel ischemic disease. Wedge-shaped hypodensities in the posterior parietal lobes left greater than right without hemorrhage. These likely represent age indeterminate watershed infarcts or likely subacute to chronic. Additional small cortical infarcts near the vertex of brain involving the high parietal lobe. 3. Cervical spondylosis without acute cervical spine fracture. 4. Lipoma deep to the left subscapularis muscle measuring at least 5.3 x 2.2 cm on axial series 8, image 79.   Mr Brain Wo Contrast 08/10/2017 IMPRESSION: Acute/subacute infarction in the posterior portion of the left MCA territory affecting the posterior temporal lobe and temporoparietal junction region. Few other scattered punctate acute infarctions in the left temporal lobe and left hemispheric deep white matter. Findings most consistent with embolic disease to the left MCA territory. Some component of watershed infarction may be present. Petechial blood products in the region of infarction with visible thrombosed vessel on the gradient echo imaging. No frank parenchymal hematoma. No mass effect or shift. Old ischemic changes elsewhere throughout the brain as outlined above.   CUS  Right Carotid: Velocities in the right ICA are consistent with a 1-39% stenosis. Left Carotid: Velocities in the left ICA are consistent with a 1-39% stenosis. Vertebrals: Both vertebral arteries were patent with antegrade flow. Subclavians: Normal flow hemodynamics were seen in bilateral subclavian       arteries.  TTE - Left ventricle: The cavity size was normal. Wall thickness was   increased in a pattern of mild LVH. There was moderate focal   basal hypertrophy of the septum. Systolic  function was normal.   The estimated ejection fraction was in the range of 60% to 65%.   Doppler parameters are consistent with abnormal left ventricular   relaxation (grade 1 diastolic dysfunction). The E/e&' ratio is   between 8-15, suggesting indeterminate LV filling pressure. - Aortic valve: Trileaflet. Sclerosis without stenosis. There was   no regurgitation. - Mitral valve: Mildly thickened leaflets . There was trivial  regurgitation. - Left atrium: Moderately dilated. - Right ventricle: The cavity size was normal. Wall thickness was   normal. Systolic function was normal. Lateral annulus peak S   velocity: 14.3 cm/s. - Tricuspid valve: There was mild regurgitation. - Pulmonary arteries: PA peak pressure: 29 mm Hg (S). - Inferior vena cava: The vessel was normal in size. The   respirophasic diameter changes were in the normal range (>= 50%),   consistent with normal central venous pressure. Impressions: - Compared to a prior study in 2018, there has been no significant   change.  Lab Review Component     Latest Ref Rng & Units 08/09/2017 08/10/2017  Cholesterol     0 - 200 mg/dL  101  Triglycerides     <150 mg/dL  74  HDL Cholesterol     >40 mg/dL  33 (L)  Total CHOL/HDL Ratio     RATIO  3.1  VLDL     0 - 40 mg/dL  15  LDL (calc)     0 - 99 mg/dL  53  Hemoglobin A1C     4.8 - 5.6 %  5.2  Mean Plasma Glucose     mg/dL  103  Ammonia     9 - 35 umol/L 26   TSH     0.350 - 4.500 uIU/mL  0.903     Assessment and Plan:   In summary, JAKORY MATSUO is a 84 y.o. male with PMH of hypertension, pulmonary HTN, baseline dementia and PAD found down at the bus stop. Admitted to [facility] on 08/09/17 for work up of syncope/collapse but MRI found to have left inferior MCA acute infarct as well as left MCA/ACA punctate infarcts. CUS and TTE unremarkable. LDL 53 and A1C 5.2. Cardiac work up unrevealing. Discharged with ASA 81mg  and Lipitor 80mg . Stroke embolic pattern, and concerning for  PAF. Will need MRA head and cardiac monitoring.   - recommend to increase ASA from 81 to 325mg  daily - recommend to decrease lipitor for 80 to 20mg  daily - will do MRA head to evaluate the brain vessels - will do 30 day cardiac event monitoring to rule out Afib - follow up with physician in rehab to remove staples on the scalp - Follow up with your primary care physician for stroke risk factor modification. Recommend maintain blood pressure goal <130/80, diabetes with hemoglobin A1c goal below 7.0% and lipids with LDL cholesterol goal below 70 mg/dL.  - check BP at facility once a day and record - continue PT/OT in the facility - follow up in 2 months with me.   I recommend aggressive blood pressure control with a goal <130/80 mm Hg.  Lipids should be managed intensively, with a goal LDL < 70 mg/dL.  I encouraged the patient to discuss these important issues with his primary care physician.  I counseled the patient on measures to reduce stroke risk, including the importance of medication compliance, risk factor control, exercise, healthy diet, and avoidance of smoking.  I reviewed stroke warning signs and symptoms and appropriate actions to take if such occurs.   Thank you very much for the opportunity to participate in the care of this patient.  Please do not hesitate to call if any questions or concerns arise.  Orders Placed This Encounter  Procedures  . MR MRA HEAD WO CONTRAST    Standing Status:   Future    Standing Expiration Date:   10/23/2018    Order Specific Question:   What  is the patient's sedation requirement?    Answer:   No Sedation    Order Specific Question:   Does the patient have a pacemaker or implanted devices?    Answer:   No    Order Specific Question:   Preferred imaging location?    Answer:   Internal    Order Specific Question:   Radiology Contrast Protocol - do NOT remove file path    Answer:   [email or web address]  . CARDIAC EVENT MONITOR     Standing Status:   Future    Standing Expiration Date:   08/23/2018    Scheduling Instructions:     Request cardionet setup. Thank you.    Order Specific Question:   Where should this test be performed?    Answer:   [facility]    No orders of the defined types were placed in this encounter.   Patient Instructions  - recommend to increase ASA from 81 to 325mg  daily - recommend to decrease lipitor for 80 to 20mg  daily - will do MRA head to evaluate the brain vessels - will do 30 day cardiac event monitoring to rule out Afib - follow up with physician in rehab to remove staples on the scalp - Follow up with your primary care physician for stroke risk factor modification. Recommend maintain blood pressure goal <130/80, diabetes with hemoglobin A1c goal below 7.0% and lipids with LDL cholesterol goal below 70 mg/dL.  - check BP at facility once a day and record - continue PT/OT in the facility - follow up in 2 months with me.    Rosalin Hawking, MD PhD [facility] [address], [place], [place] [address] [phone]

## 2017-09-18 ENCOUNTER — Encounter: Payer: Self-pay | Admitting: Adult Health

## 2017-09-18 ENCOUNTER — Non-Acute Institutional Stay (SKILLED_NURSING_FACILITY): Payer: Medicare Other | Admitting: Adult Health

## 2017-09-18 DIAGNOSIS — I739 Peripheral vascular disease, unspecified: Secondary | ICD-10-CM

## 2017-09-18 DIAGNOSIS — I10 Essential (primary) hypertension: Secondary | ICD-10-CM | POA: Diagnosis not present

## 2017-09-18 DIAGNOSIS — I63512 Cerebral infarction due to unspecified occlusion or stenosis of left middle cerebral artery: Secondary | ICD-10-CM

## 2017-09-18 NOTE — Progress Notes (Signed)
Location:   [facility] Room Number: 125 A Place of Service:  SNF (31)   CODE STATUS: DNR  No Known Allergies  Chief Complaint  Patient presents with  . Medical Management of Chronic Issues    Cva; hypertension; pad    HPI:  He is a 84 year old long term resident of this facility being seen for the management of his chronic illnesses: cva; hypertension; pad. He denies any headaches; no changes in vision; no complaints of changes in appetite. There are no nursing concerns at this time.    Past Medical History:  Diagnosis Date  . BPH (benign prostatic hyperplasia)   . Cataract   . Exertional dyspnea    with exertion  . Hypertension   . Mitral valvular regurgitation June 2014   Mild to moderate MR on echocardiogram  . PAD (peripheral artery disease) ([place])  December 2014   Lower extremity Dopplers/ABIs: RABI 0.29, LABI 0.66; bilateral external iliacs with significant diameter reduction; R. SFA occlusive disease throughout into popliteal artery with 0 vessel runoff. Anterior tibial reconstitutes distally. 70-99% reduction in all SFA. One-vessel runoff (anterior tibial)  . Stroke [facility])     Past Surgical History:  Procedure Laterality Date  . ABDOMINAL ANGIOGRAM  07/07/2013   Procedure: ABDOMINAL ANGIOGRAM;  Surgeon: Lorretta Harp, MD;  Location: [facility] CATH LAB;  Service: Cardiovascular;;  . CHOLECYSTECTOMY  07/04/2010  . ERCP N/A 11/27/2012   Procedure: ENDOSCOPIC RETROGRADE CHOLANGIOPANCREATOGRAPHY (ERCP);  Surgeon: Inda Castle, MD;  Location: [facility] ENDOSCOPY;  Service: Endoscopy;  Laterality: N/A;  . EYE SURGERY Right   . LEFT HEART CATHETERIZATION WITH CORONARY ANGIOGRAM N/A 07/07/2013   Procedure: LEFT HEART CATHETERIZATION WITH CORONARY ANGIOGRAM;  Surgeon: Lorretta Harp, MD;  Location: [facility] CATH LAB;  Service: Cardiovascular;  Laterality: N/A;  . LOWER EXTREMITY ANGIOGRAM N/A 07/07/2013   Procedure: LOWER EXTREMITY ANGIOGRAM;  Surgeon: Lorretta Harp, MD;   Location: [facility] CATH LAB;  Service: Cardiovascular;  Laterality: N/A;  . NM MYOVIEW LTD  December 2014   Apical thinning but no ischemia. EF 55%  . PROSTATE SURGERY     Dr. Lowella Bandy  . TRANSTHORACIC ECHOCARDIOGRAM  June 2014   Basal septal thickening with moderate LVH. EF 65%. Normal wall motion. Diastolic dysfunction/NOS. Aortic sclerosis without stenosis. Mild to moderate MR    Social History   Socioeconomic History  . Marital status: Single    Spouse name: Not on file  . Number of children: Not on file  . Years of education: Not on file  . Highest education level: Not on file  Occupational History  . Occupation: Retired    Fish farm manager: RETIRED  Social Needs  . Financial resource strain: Not on file  . Food insecurity:    Worry: Not on file    Inability: Not on file  . Transportation needs:    Medical: Not on file    Non-medical: Not on file  Tobacco Use  . Smoking status: Former Smoker    Types: Cigarettes    Last attempt to quit: 11/25/1980    Years since quitting: 36.8  . Smokeless tobacco: Never Used  Substance and Sexual Activity  . Alcohol use: Yes    Alcohol/week: 2.4 oz    Types: 4 Standard drinks or equivalent per week    Comment: beers  . Drug use: No  . Sexual activity: Never  Lifestyle  . Physical activity:    Days per week: Not on file  Minutes per session: Not on file  . Stress: Not on file  Relationships  . Social connections:    Talks on phone: Not on file    Gets together: Not on file    Attends religious service: Not on file    Active member of club or organization: Not on file    Attends meetings of clubs or organizations: Not on file    Relationship status: Not on file  . Intimate partner violence:    Fear of current or ex partner: Not on file    Emotionally abused: Not on file    Physically abused: Not on file    Forced sexual activity: Not on file  Other Topics Concern  . Not on file  Social History Narrative   He is originally from  [place]. He has a history of long-standing tobacco use but quit several years ago. He also has a history of former alcohol abuse as well.   He is retired and currently single.   Family History  Problem Relation Age of Onset  . Diabetes Sister   . Cancer Sister   . Cancer Mother   . Cancer Father   . Cancer Sister       VITAL SIGNS BP 124/76   Pulse 76   Temp 98.1 F (36.7 C)   Resp 20   Ht 5\' 5"  (1.651 m)   Wt 154 lb 12.8 oz (70.2 kg)   SpO2 99%   BMI 25.76 kg/m   Outpatient Encounter Medications as of 09/18/2017  Medication Sig  . acetaminophen (TYLENOL) 325 MG tablet Take 2 tablets (650 mg total) by mouth every 6 (six) hours as needed for mild pain (or Fever >/= 101).  Marland Kitchen aspirin 81 MG EC tablet Take 1 tablet (81 mg total) by mouth daily.  Marland Kitchen atorvastatin (LIPITOR) 20 MG tablet Take 20 mg by mouth at bedtime.  . ENSURE (ENSURE) Take 237 mLs by mouth 2 (two) times daily between meals.  Marland Kitchen lisinopril (PRINIVIL,ZESTRIL) 40 MG tablet Take 1 tablet (40 mg total) by mouth daily.  . sertraline (ZOLOFT) 25 MG tablet Take 25 mg by mouth daily.  . [DISCONTINUED] atorvastatin (LIPITOR) 80 MG tablet Take 1 tablet (80 mg total) by mouth daily at 6 PM. (Patient not taking: Reported on 09/18/2017)   No facility-administered encounter medications on file as of 09/18/2017.      SIGNIFICANT DIAGNOSTIC EXAMS  PREVIOUS:   08-09-17: chest x-ray:No active cardiopulmonary disease. Stable mild cardiomegaly.  08-09-17: pelvic x-ray:No definite acute osseous abnormality  08-09-17: ct of head and cervical spine:1. Small left occipital scalp contusion and laceration without underlying skull fracture. 2. Cerebral atrophy with chronic small vessel ischemic disease. Wedge-shaped hypodensities in the posterior parietal lobes left greater than right without hemorrhage. These likely represent age indeterminate watershed infarcts or likely subacute to chronic. Additional small cortical infarcts near  the vertex of brain involving the high parietal lobe. 3. Cervical spondylosis without acute cervical spine fracture. 4. Lipoma deep to the left subscapularis muscle measuring at least 5.3 x 2.2 cm on axial series 8, image 79.  08-10-17: MRI of brain:Acute/subacute infarction in the posterior portion of the left MCA territory affecting the posterior temporal lobe and temporoparietal junction region. Few other scattered punctate acute infarctions in the left temporal lobe and left hemispheric deep white matter. Findings most consistent with embolic disease to the left MCA territory. Some component of watershed infarction may be present. Petechial blood products in the region of infarction with  visible thrombosed vessel on the gradient echo imaging. No frank parenchymal hematoma. No mass effect or shift. Old ischemic changes elsewhere throughout the brain as outlined Above.  08-10-17: 2-d echo:- Left ventricle: The cavity size was normal. Wall thickness was increased in a pattern of mild LVH. There was moderate focal basal hypertrophy of the septum. Systolic function was normal.The estimated ejection fraction was in the range of 60% to 65%. Doppler parameters are consistent with abnormal left ventricular relaxation (grade 1 diastolic dysfunction). The E/e&' ratio is between 8-15, suggesting indeterminate LV filling pressure. - Aortic valve: Trileaflet. Sclerosis without stenosis. There was no regurgitation. - Mitral valve: Mildly thickened leaflets . There was trivial regurgitation. - Left atrium: Moderately dilated. - Right ventricle: The cavity size was normal. Wall thickness was normal. Systolic function was normal. Lateral annulus peak S velocity: 14.3 cm/s. - Tricuspid valve: There was mild regurgitation. - Pulmonary arteries: PA peak pressure: 29 mm Hg (S). - Inferior vena cava: The vessel was normal in size. The respirophasic diameter changes were in the normal range (>= 50%), consistent with  normal central venous pressure.  08-10-17: carotid doppler:Right Carotid: Velocities in the right ICA are consistent with a 1-39% stenosis. Left Carotid: Velocities in the left ICA are consistent with a 1-39% stenosis.  08-11-17: bilateral BHA:LPFXT: Resting right ankle-brachial index indicates moderate right lower extremity arterial disease. The right toe-brachial index is abnormal. ABIs are unreliable. Left: Resting left ankle-brachial index indicates moderate left lower extremity arterial disease. The left toe-brachial index is abnormal. ABIs are unreliable.   NO NEW EXAMS   LABS REVIEWED: PREVIOUS   08-09-17: wbc 6.3; hgb 13.7; hct 42.0; mcv 88.4 plt 384; glucose 118; bun 9; creat 0.92; k+ 3.9; na++ 141; ca 9.6; total bili 1.4; albumin 3.7 08-10-17: wbc 5.8; hgb 12.2; hct 38.5; mcv 88.7; plt 345; glucose 99; bun 8; creat 0.88; k+ 3.4; na++ 143; ca 8.5; liver normal albumin 3.1 08-16-17: glucose 81; bun 13.7; creat 0.62; k+ 4.5; na++ 142; ca 9.0   NO NEW LABS   Review of Systems  Constitutional: Negative for malaise/fatigue.  Respiratory: Negative for cough and shortness of breath.   Cardiovascular: Negative for chest pain, palpitations and leg swelling.  Gastrointestinal: Negative for abdominal pain, constipation and heartburn.  Musculoskeletal: Negative for back pain, joint pain and myalgias.  Skin: Negative.   Neurological: Negative for dizziness.  Psychiatric/Behavioral: The patient is not nervous/anxious.      Physical Exam  Constitutional: He is oriented to person, place, and time. He appears well-developed and well-nourished. No distress.  Neck: No thyromegaly present.  Cardiovascular: Normal rate, regular rhythm and intact distal pulses.  Murmur heard. 1/6  Pulmonary/Chest: Effort normal and breath sounds normal. No respiratory distress.  Abdominal: Soft. Bowel sounds are normal. He exhibits no distension. There is no tenderness.  Musculoskeletal: Normal range of  motion. He exhibits no edema.  Lymphadenopathy:    He has no cervical adenopathy.  Neurological: He is alert and oriented to person, place, and time.  Has mild confusion present   Skin: Skin is warm and dry. He is not diaphoretic.    ASSESSMENT/ PLAN:  TODAY:   1. Acute ischemic left MCA stroke: is neurologically stable will continue asa 81 mg daily   2. PAD: is without change in status: will continue asa 81 mg daily   3. Essential hypertension: stable b/p 124/76: will continue lisinopril 40 mg daily   PREVIOUS  4. Dyslipidemia: stable will continue lipitor 20 mg daily  5. Protein calorie malnutrition: albumin 3.1; will continue ensure twice daily   6. Vascular dementia without behavioral disturbance: no change in status: will not make changes will monitor     MD is aware of resident's narcotic use and is in agreement with current plan of care. We will attempt to wean resident as apropriate   Ok Edwards NP [facility]  Contact [phone] Monday through Friday 8am- 5pm  After hours call [phone]

## 2017-09-20 ENCOUNTER — Ambulatory Visit
Admission: RE | Admit: 2017-09-20 | Discharge: 2017-09-20 | Disposition: A | Discharge status: Home / Self Care | Payer: Medicare Other | Source: Ambulatory Visit | Attending: Neurology | Admitting: Neurology

## 2017-09-20 DIAGNOSIS — I63512 Cerebral infarction due to unspecified occlusion or stenosis of left middle cerebral artery: Secondary | ICD-10-CM

## 2017-09-25 ENCOUNTER — Telehealth: Payer: Self-pay

## 2017-09-25 NOTE — Telephone Encounter (Signed)
-----   Message from Rosalin Hawking, MD sent at 09/20/2017 11:10 PM EDT ----- Could you please let the patient know that the MRI brain vessel showed some atherosclerosis bilaterally which is common for patients with his age. No specific intervention needed. Please continue current treatment. Wait for heart monitoring result. Thanks.  Rosalin Hawking, MD PhD Stroke Neurology 09/20/2017 11:10 PM

## 2017-09-25 NOTE — Telephone Encounter (Signed)
Notes recorded by Marval Regal, RN on 09/25/2017 at 9:47 AM EDT Left vm for patients HCPOA Isa Kohlenberg to call back about pts MR brain results. ------

## 2017-09-28 NOTE — Telephone Encounter (Signed)
Notes recorded by Marval Regal, RN on 09/28/2017 at 8:45 AM EDT Left 2nd vm for patients HCPOA to call back about MRI results. ------

## 2017-10-04 ENCOUNTER — Ambulatory Visit (INDEPENDENT_AMBULATORY_CARE_PROVIDER_SITE_OTHER): Payer: Medicare Other

## 2017-10-04 ENCOUNTER — Other Ambulatory Visit: Payer: Self-pay | Admitting: Neurology

## 2017-10-04 DIAGNOSIS — I4891 Unspecified atrial fibrillation: Secondary | ICD-10-CM

## 2017-10-04 DIAGNOSIS — I639 Cerebral infarction, unspecified: Secondary | ICD-10-CM

## 2017-10-04 DIAGNOSIS — I63512 Cerebral infarction due to unspecified occlusion or stenosis of left middle cerebral artery: Secondary | ICD-10-CM | POA: Diagnosis not present

## 2017-10-04 NOTE — Telephone Encounter (Signed)
Notes recorded by Marval Regal, RN on 10/04/2017 at 3:03 PM EDT Rn call Lissa Merlin but [facility] wife answer the phone.Adriano was at [employer] is on dpr form. Rn stated patient know that the MRI brain vessel showed some atherosclerosis bilaterally which is common for patients with his age. No specific intervention needed. Please continue current treatment. Rn stated per Dr Erlinda Hong he recommend keeping blood pressure readings normal, and take his statin to keep cholesterol levels regulated. Marva verbalized understanding.Marland Kitchen

## 2017-10-19 ENCOUNTER — Ambulatory Visit: Payer: Medicare Other | Admitting: Podiatry

## 2017-10-30 ENCOUNTER — Ambulatory Visit (INDEPENDENT_AMBULATORY_CARE_PROVIDER_SITE_OTHER): Payer: Medicare Other | Admitting: Neurology

## 2017-10-30 ENCOUNTER — Encounter: Payer: Self-pay | Admitting: Neurology

## 2017-10-30 VITALS — BP 139/73 | HR 75 | Ht 65.0 in | Wt 156.8 lb

## 2017-10-30 DIAGNOSIS — I10 Essential (primary) hypertension: Secondary | ICD-10-CM | POA: Diagnosis not present

## 2017-10-30 DIAGNOSIS — Z8673 Personal history of transient ischemic attack (TIA), and cerebral infarction without residual deficits: Secondary | ICD-10-CM

## 2017-10-30 DIAGNOSIS — I739 Peripheral vascular disease, unspecified: Secondary | ICD-10-CM | POA: Diagnosis not present

## 2017-10-30 MED ORDER — ASPIRIN EC 325 MG PO TBEC: 325.0000 mg | DELAYED_RELEASE_TABLET | Freq: Every day | ORAL | 0 refills | Status: DC

## 2017-10-30 MED ORDER — SERTRALINE HCL 50 MG PO TABS: 50.0000 mg | ORAL_TABLET | Freq: Every day | ORAL | Status: AC

## 2017-10-30 MED ORDER — ASPIRIN 81 MG PO TBEC: 325.0000 mg | DELAYED_RELEASE_TABLET | Freq: Every day | ORAL | Status: DC

## 2017-10-30 NOTE — Progress Notes (Signed)
NEUROLOGY CLINIC NEW CONSULT NOTE  NAME: [name] DOB: [birth year] REFERRING PHYSICIAN: No ref. provider found  I saw [name] as a new consult in the neurovascular clinic today regarding  Chief Complaint  Patient presents with  . Follow-up    Stroke follow up room 1.Pt is at [facility] in [place] states he is in independent living   .  HPI: [name] is a 84 y.o. male with PMH of hypertension, pulmonary hypertension, baseline dementia and PAD who presents as a new patient for a stroke  Patient came today to clinic with his neighbor and also his POA.  Patient cannot remember what happened that day but POA stated that apparently he has vision difficulties not able to drive, he went to bus stop to catch a bus but was found down at the bus stop. Admitted to [facility] on 08/09/17 for work up of syncope/collapse but MRI found to have left inferior MCA acute infarct as well as left MCA/ACA punctate infarcts. CUS and TTE unremarkable. LDL 53 and A1C 5.2. Cardiac work up unrevealing. Discharged with ASA 81mg  and Lipitor 80mg .  During the interval time, pt back to baseline. No recurrent stroke or syncope. He can not remember the event.  He has been following with cardiology for pulmonary hypertension, and PAD, so far stable.  BP today 140/75.  Denies palpitation, or heart racing.  Interval history During the interval time, pt has been doing well. No recurrent symptoms. Had MRA head showed mild atherosclerosis but no significant stenosis. Had 30 day cardiac monitoring and will complete in 2 days. He is on ASA 325mg  and lipitor 20 without side effect. Denies palpitation and heart racing. BP today 139/73. Use walker to walk but able to walk without device in facility. Staples on scalp have been removed. No other complains.   Past Medical History:  Diagnosis Date  . BPH (benign prostatic hyperplasia)   . Cataract   . Exertional dyspnea    with exertion  . Hypertension   . Mitral  valvular regurgitation June 2014   Mild to moderate MR on echocardiogram  . PAD (peripheral artery disease) ([place])  December 2014   Lower extremity Dopplers/ABIs: RABI 0.29, LABI 0.66; bilateral external iliacs with significant diameter reduction; R. SFA occlusive disease throughout into popliteal artery with 0 vessel runoff. Anterior tibial reconstitutes distally. 70-99% reduction in all SFA. One-vessel runoff (anterior tibial)  . Stroke [facility])    Past Surgical History:  Procedure Laterality Date  . ABDOMINAL ANGIOGRAM  07/07/2013   Procedure: ABDOMINAL ANGIOGRAM;  Surgeon: Lorretta Harp, MD;  Location[facility] C[facility] LAB;  Service: Cardiovascular;;  . CHOLECYSTECTOMY  07/04/2010  . ERCP N/A 11/27/2012   Procedure: ENDOSCOPIC RETROGRADE CHOLANGIOPANCREATOGRAPHY (ERCP);  Surgeon: Inda Castle, MD;  Location: [facility];  Service: Endoscopy;  Laterality: N/A;  . EYE SURGERY Right   . LEFT HEART CATHETERIZATION WITH CORONARY ANGIOGRAM N/A 07/07/2013   Procedure: LEFT HEART CATHETERIZATION WITH CORONARY ANGIOGRAM;  Surgeon: Lorretta Harp, MD;  Location: [facility] CATH LAB;  Service: Cardiovascular;  Laterality: N/A;  . LOWER EXTREMITY ANGIOGRAM N/A 07/07/2013   Procedure: LOWER EXTREMITY ANGIOGRAM;  Surgeon: Lorretta Harp, MD;  Location: [facility] CATH LAB;  Service: Cardiovascular;  Laterality: N/A;  . NM MYOVIEW LTD  December 2014   Apical thinning but no ischemia. EF 55%  . PROSTATE SURGERY     Dr. Lowella Bandy  . TRANSTHORACIC ECHOCARDIOGRAM  June 2014   Basal septal thickening  with moderate LVH. EF 65%. Normal wall motion. Diastolic dysfunction/NOS. Aortic sclerosis without stenosis. Mild to moderate MR   Family History  Problem Relation Age of Onset  . Diabetes Sister   . Cancer Sister   . Cancer Mother   . Cancer Father   . Cancer Sister    Current Outpatient Medications  Medication Sig Dispense Refill  . acetaminophen (TYLENOL) 325 MG tablet Take 2 tablets (650 mg total) by mouth every 6 (six)  hours as needed for mild pain (or Fever >/= 101).    Marland Kitchen atorvastatin (LIPITOR) 20 MG tablet Take 20 mg by mouth at bedtime.    . ENSURE (ENSURE) Take 237 mLs by mouth 2 (two) times daily between meals.    Marland Kitchen lisinopril (PRINIVIL,ZESTRIL) 40 MG tablet Take 1 tablet (40 mg total) by mouth daily.  5  . sertraline (ZOLOFT) 50 MG tablet Take 1 tablet (50 mg total) by mouth daily.    Marland Kitchen aspirin EC 325 MG tablet Take 1 tablet (325 mg total) by mouth daily. 30 tablet 0   No current facility-administered medications for this visit.    No Known Allergies Social History   Socioeconomic History  . Marital status: Single    Spouse name: Not on file  . Number of children: Not on file  . Years of education: Not on file  . Highest education level: Not on file  Occupational History  . Occupation: Retired    Fish farm manager: RETIRED  Social Needs  . Financial resource strain: Not on file  . Food insecurity:    Worry: Not on file    Inability: Not on file  . Transportation needs:    Medical: Not on file    Non-medical: Not on file  Tobacco Use  . Smoking status: Former Smoker    Types: Cigarettes    Last attempt to quit: 11/25/1980    Years since quitting: 36.9  . Smokeless tobacco: Never Used  Substance and Sexual Activity  . Alcohol use: Not Currently    Alcohol/week: 2.4 oz    Types: 4 Standard drinks or equivalent per week    Comment: beers  . Drug use: No  . Sexual activity: Never  Lifestyle  . Physical activity:    Days per week: Not on file    Minutes per session: Not on file  . Stress: Not on file  Relationships  . Social connections:    Talks on phone: Not on file    Gets together: Not on file    Attends religious service: Not on file    Active member of club or organization: Not on file    Attends meetings of clubs or organizations: Not on file    Relationship status: Not on file  . Intimate partner violence:    Fear of current or ex partner: Not on file    Emotionally abused:  Not on file    Physically abused: Not on file    Forced sexual activity: Not on file  Other Topics Concern  . Not on file  Social History Narrative   He is originally from [place]. He has a history of long-standing tobacco use but quit several years ago. He also has a history of former alcohol abuse as well.   He is retired and currently single.    Review of Systems Full 14 system review of systems performed and notable only for those listed, all others are neg:  Constitutional:   Cardiovascular:  Ear/Nose/Throat:   Skin:  Eyes:   Respiratory:   Gastroitestinal:   Genitourinary:  Hematology/Lymphatic:   Endocrine:  Musculoskeletal:   Allergy/Immunology:   Neurological:  Psychiatric:  Sleep:    Physical Exam  Vitals:   10/30/17 0847  BP: 139/73  Pulse: 75    General - Well nourished, well developed, in no apparent distress.  Ophthalmologic - fundi not visualized due to noncooperation.  Cardiovascular - Regular rate and rhythm.  Neck - supple, no nuchal rigidity .  Mental Status -  Level of arousal and orientation intact x 3. Language including expression, naming, repetition, comprehension, reading, and writing was assessed and found intact.  Cranial Nerves II - XII - II - Visual field intact OU. III, IV, VI - Extraocular movements intact. V - Facial sensation intact bilaterally. VII - Facial movement intact bilaterally. VIII - Hearing & vestibular intact bilaterally. X - Palate elevates symmetrically. XI - Chin turning & shoulder shrug intact bilaterally. XII - Tongue protrusion intact.  Motor Strength - The patient's strength was normal in all extremities and pronator drift was absent.  Bulk was normal and fasciculations were absent.   Motor Tone - Muscle tone was assessed at the neck and appendages and was normal.  Reflexes - The patient's reflexes were normal in all extremities and he had no pathological reflexes.  Sensory - Light touch,  temperature/pinprick were assessed and were normal.    Coordination - The patient had normal movements in the hands with no ataxia or dysmetria.  Tremor was absent.  Gait and Station - walk with walker, stable   Imaging  I have personally reviewed the radiological images below and agree with the radiology interpretations.  Ct Head Wo Contrast 08/09/2017 IMPRESSION: 1. Small left occipital scalp contusion and laceration without underlying skull fracture. 2. Cerebral atrophy with chronic small vessel ischemic disease. Wedge-shaped hypodensities in the posterior parietal lobes left greater than right without hemorrhage. These likely represent age indeterminate watershed infarcts or likely subacute to chronic. Additional small cortical infarcts near the vertex of brain involving the high parietal lobe. 3. Cervical spondylosis without acute cervical spine fracture. 4. Lipoma deep to the left subscapularis muscle measuring at least 5.3 x 2.2 cm on axial series 8, image 79.   Mr Brain Wo Contrast 08/10/2017 IMPRESSION: Acute/subacute infarction in the posterior portion of the left MCA territory affecting the posterior temporal lobe and temporoparietal junction region. Few other scattered punctate acute infarctions in the left temporal lobe and left hemispheric deep white matter. Findings most consistent with embolic disease to the left MCA territory. Some component of watershed infarction may be present. Petechial blood products in the region of infarction with visible thrombosed vessel on the gradient echo imaging. No frank parenchymal hematoma. No mass effect or shift. Old ischemic changes elsewhere throughout the brain as outlined above.   CUS  Right Carotid: Velocities in the right ICA are consistent with a 1-39% stenosis. Left Carotid: Velocities in the left ICA are consistent with a 1-39% stenosis. Vertebrals: Both vertebral arteries were patent with antegrade flow. Subclavians: Normal flow  hemodynamics were seen in bilateral subclavian       arteries.  TTE - Left ventricle: The cavity size was normal. Wall thickness was   increased in a pattern of mild LVH. There was moderate focal   basal hypertrophy of the septum. Systolic function was normal.   The estimated ejection fraction was in the range of 60% to 65%.   Doppler parameters are consistent with abnormal left ventricular  relaxation (grade 1 diastolic dysfunction). The E/e&' ratio is   between 8-15, suggesting indeterminate LV filling pressure. - Aortic valve: Trileaflet. Sclerosis without stenosis. There was   no regurgitation. - Mitral valve: Mildly thickened leaflets . There was trivial   regurgitation. - Left atrium: Moderately dilated. - Right ventricle: The cavity size was normal. Wall thickness was   normal. Systolic function was normal. Lateral annulus peak S   velocity: 14.3 cm/s. - Tricuspid valve: There was mild regurgitation. - Pulmonary arteries: PA peak pressure: 29 mm Hg (S). - Inferior vena cava: The vessel was normal in size. The   respirophasic diameter changes were in the normal range (>= 50%),   consistent with normal central venous pressure. Impressions: - Compared to a prior study in 2018, there has been no significant   change.  MRA head 1.   There is reduced flow in the M2 segments of the left middle cerebral artery. 2.   Mild stenosis of the distal M1 segment of the right middle cerebral artery.  Lab Review Component     Latest Ref Rng & Units 08/09/2017 08/10/2017  Cholesterol     0 - 200 mg/dL  101  Triglycerides     <150 mg/dL  74  HDL Cholesterol     >40 mg/dL  33 (L)  Total CHOL/HDL Ratio     RATIO  3.1  VLDL     0 - 40 mg/dL  15  LDL (calc)     0 - 99 mg/dL  53  Hemoglobin A1C     4.8 - 5.6 %  5.2  Mean Plasma Glucose     mg/dL  103  Ammonia     9 - 35 umol/L 26   TSH     0.350 - 4.500 uIU/mL  0.903     Assessment and Plan:   In summary, [name]  is a 84 y.o. male with PMH of hypertension, pulmonary HTN, baseline dementia and PAD found down at the bus stop. Admitted to [facility] on 08/09/17 for work up of syncope/collapse but MRI found to have left inferior MCA acute infarct as well as left MCA/ACA punctate infarcts. CUS and TTE unremarkable. LDL 53 and A1C 5.2. Cardiac work up unrevealing. Discharged with ASA 81mg  and Lipitor 80mg . Stroke embolic pattern, and concerning for cardioembolic. MRA head showed mild athero b/l MCAs, and 30 day cardiac monitoring will complete in 2 days. Currently on ASA 325 and lipitor 20. Continues doing PT/OT in facility.   - continue ASA and lipitor for stroke prevention - will follow up on the heart monitoring to rule out Afib - Follow up with your primary care physician in facility for stroke risk factor modification. Recommend maintain blood pressure goal <130/80, diabetes with hemoglobin A1c goal below 7.0% and lipids with LDL cholesterol goal below 70 mg/dL.  - check BP at facility once a day and record - continue PT/OT in the facility, avoid fall.  - follow up in 3 months with Janett Billow.    No orders of the defined types were placed in this encounter.   Meds ordered this encounter  Medications  . DISCONTD: aspirin 81 MG EC tablet    Sig: Take 4 tablets (325 mg total) by mouth daily.    Dispense:  30 tablet  . aspirin EC 325 MG tablet    Sig: Take 1 tablet (325 mg total) by mouth daily.    Dispense:  30 tablet    Refill:  0  .  sertraline (ZOLOFT) 50 MG tablet    Sig: Take 1 tablet (50 mg total) by mouth daily.    Patient Instructions  - continue ASA and lipitor for stroke prevention - will follow up on the heart monitoring to rule out Afib - Follow up with your primary care physician for stroke risk factor modification. Recommend maintain blood pressure goal <130/80, diabetes with hemoglobin A1c goal below 7.0% and lipids with LDL cholesterol goal below 70 mg/dL.  - check BP at facility once a day and  record - continue PT/OT in the facility, avoid fall.  - follow up in 3 months with Janett Billow.    Rosalin Hawking, MD PhD [facility] [address] [place], [place] [address] [phone]

## 2017-10-30 NOTE — Patient Instructions (Addendum)
-   continue ASA and lipitor for stroke prevention - will follow up on the heart monitoring to rule out Afib - Follow up with your primary care physician for stroke risk factor modification. Recommend maintain blood pressure goal <130/80, diabetes with hemoglobin A1c goal below 7.0% and lipids with LDL cholesterol goal below 70 mg/dL.  - check BP at facility once a day and record - continue PT/OT in the facility, avoid fall.  - follow up in 3 months with Janett Billow.

## 2017-10-31 ENCOUNTER — Encounter: Payer: Self-pay | Admitting: Adult Health

## 2017-10-31 ENCOUNTER — Non-Acute Institutional Stay (SKILLED_NURSING_FACILITY): Payer: Medicare Other | Admitting: Adult Health

## 2017-10-31 DIAGNOSIS — E785 Hyperlipidemia, unspecified: Secondary | ICD-10-CM | POA: Diagnosis not present

## 2017-10-31 DIAGNOSIS — F0150 Vascular dementia without behavioral disturbance: Secondary | ICD-10-CM | POA: Diagnosis not present

## 2017-10-31 DIAGNOSIS — E440 Moderate protein-calorie malnutrition: Secondary | ICD-10-CM

## 2017-10-31 NOTE — Progress Notes (Signed)
Location:   [facility] Room Number: 227 A Place of Service:  SNF (31)   CODE STATUS: DNR  No Known Allergies  Chief Complaint  Patient presents with  . Medical Management of Chronic Issues    Dementia; dyslipidemia; malnutrition    HPI:  He is a 84 year old long term resident of this facility being seen for the management of his chronic illnesses: dementia; dyslipidemia; malnutrition. He denies any uncontrolled pain; no change in appetite; no anxiety. There are no nursing concerns at this time. He continues to participate in therapy.   Past Medical History:  Diagnosis Date  . BPH (benign prostatic hyperplasia)   . Cataract   . Exertional dyspnea    with exertion  . Hypertension   . Mitral valvular regurgitation June 2014   Mild to moderate MR on echocardiogram  . PAD (peripheral artery disease) ([place])  December 2014   Lower extremity Dopplers/ABIs: RABI 0.29, LABI 0.66; bilateral external iliacs with significant diameter reduction; R. SFA occlusive disease throughout into popliteal artery with 0 vessel runoff. Anterior tibial reconstitutes distally. 70-99% reduction in all SFA. One-vessel runoff (anterior tibial)  . Stroke [facility])     Past Surgical History:  Procedure Laterality Date  . ABDOMINAL ANGIOGRAM  07/07/2013   Procedure: ABDOMINAL ANGIOGRAM;  Surgeon: Lorretta Harp, MD;  Location: [facility] CATH LAB;  Service: Cardiovascular;;  . CHOLECYSTECTOMY  07/04/2010  . ERCP N/A 11/27/2012   Procedure: ENDOSCOPIC RETROGRADE CHOLANGIOPANCREATOGRAPHY (ERCP);  Surgeon: Inda Castle, MD;  Location: [facility];  Service: Endoscopy;  Laterality: N/A;  . EYE SURGERY Right   . LEFT HEART CATHETERIZATION WITH CORONARY ANGIOGRAM N/A 07/07/2013   Procedure: LEFT HEART CATHETERIZATION WITH CORONARY ANGIOGRAM;  Surgeon: Lorretta Harp, MD;  Location: [facility] CATH LAB;  Service: Cardiovascular;  Laterality: N/A;  . LOWER EXTREMITY ANGIOGRAM N/A 07/07/2013   Procedure: LOWER  EXTREMITY ANGIOGRAM;  Surgeon: Lorretta Harp, MD;  Location: [facility] CATH LAB;  Service: Cardiovascular;  Laterality: N/A;  . NM MYOVIEW LTD  December 2014   Apical thinning but no ischemia. EF 55%  . PROSTATE SURGERY     Dr. Lowella Bandy  . TRANSTHORACIC ECHOCARDIOGRAM  June 2014   Basal septal thickening with moderate LVH. EF 65%. Normal wall motion. Diastolic dysfunction/NOS. Aortic sclerosis without stenosis. Mild to moderate MR    Social History   Socioeconomic History  . Marital status: Single    Spouse name: Not on file  . Number of children: Not on file  . Years of education: Not on file  . Highest education level: Not on file  Occupational History  . Occupation: Retired    Fish farm manager: RETIRED  Social Needs  . Financial resource strain: Not on file  . Food insecurity:    Worry: Not on file    Inability: Not on file  . Transportation needs:    Medical: Not on file    Non-medical: Not on file  Tobacco Use  . Smoking status: Former Smoker    Types: Cigarettes    Last attempt to quit: 11/25/1980    Years since quitting: 36.9  . Smokeless tobacco: Never Used  Substance and Sexual Activity  . Alcohol use: Not Currently    Alcohol/week: 2.4 oz    Types: 4 Standard drinks or equivalent per week    Comment: beers  . Drug use: No  . Sexual activity: Never  Lifestyle  . Physical activity:    Days per week: Not on file  Minutes per session: Not on file  . Stress: Not on file  Relationships  . Social connections:    Talks on phone: Not on file    Gets together: Not on file    Attends religious service: Not on file    Active member of club or organization: Not on file    Attends meetings of clubs or organizations: Not on file    Relationship status: Not on file  . Intimate partner violence:    Fear of current or ex partner: Not on file    Emotionally abused: Not on file    Physically abused: Not on file    Forced sexual activity: Not on file  Other Topics Concern  .  Not on file  Social History Narrative   He is originally from [place]. He has a history of long-standing tobacco use but quit several years ago. He also has a history of former alcohol abuse as well.   He is retired and currently single.   Family History  Problem Relation Age of Onset  . Diabetes Sister   . Cancer Sister   . Cancer Mother   . Cancer Father   . Cancer Sister       VITAL SIGNS BP 124/66   Pulse 77   Temp 98.1 F (36.7 C)   Resp 16   Ht 5\' 5"  (1.651 m)   Wt 154 lb (69.9 kg)   SpO2 98%   BMI 25.63 kg/m   Outpatient Encounter Medications as of 10/31/2017  Medication Sig  . acetaminophen (TYLENOL) 325 MG tablet Take 2 tablets (650 mg total) by mouth every 6 (six) hours as needed for mild pain (or Fever >/= 101).  Marland Kitchen aspirin EC 325 MG tablet Take 1 tablet (325 mg total) by mouth daily.  Marland Kitchen atorvastatin (LIPITOR) 20 MG tablet Take 20 mg by mouth at bedtime.  . ENSURE (ENSURE) Take 237 mLs by mouth 2 (two) times daily between meals.  Marland Kitchen lisinopril (PRINIVIL,ZESTRIL) 40 MG tablet Take 1 tablet (40 mg total) by mouth daily.  . sertraline (ZOLOFT) 50 MG tablet Take 1 tablet (50 mg total) by mouth daily.   No facility-administered encounter medications on file as of 10/31/2017.      SIGNIFICANT DIAGNOSTIC EXAMS  PREVIOUS:   08-09-17: chest x-ray:No active cardiopulmonary disease. Stable mild cardiomegaly.  08-09-17: pelvic x-ray:No definite acute osseous abnormality  08-09-17: ct of head and cervical spine:1. Small left occipital scalp contusion and laceration without underlying skull fracture. 2. Cerebral atrophy with chronic small vessel ischemic disease. Wedge-shaped hypodensities in the posterior parietal lobes left greater than right without hemorrhage. These likely represent age indeterminate watershed infarcts or likely subacute to chronic. Additional small cortical infarcts near the vertex of brain involving the high parietal lobe. 3. Cervical spondylosis  without acute cervical spine fracture. 4. Lipoma deep to the left subscapularis muscle measuring at least 5.3 x 2.2 cm on axial series 8, image 79.  08-10-17: MRI of brain:Acute/subacute infarction in the posterior portion of the left MCA territory affecting the posterior temporal lobe and temporoparietal junction region. Few other scattered punctate acute infarctions in the left temporal lobe and left hemispheric deep white matter. Findings most consistent with embolic disease to the left MCA territory. Some component of watershed infarction may be present. Petechial blood products in the region of infarction with visible thrombosed vessel on the gradient echo imaging. No frank parenchymal hematoma. No mass effect or shift. Old ischemic changes elsewhere throughout the brain as  outlined Above.  08-10-17: 2-d echo:- Left ventricle: The cavity size was normal. Wall thickness was increased in a pattern of mild LVH. There was moderate focal basal hypertrophy of the septum. Systolic function was normal.The estimated ejection fraction was in the range of 60% to 65%. Doppler parameters are consistent with abnormal left ventricular relaxation (grade 1 diastolic dysfunction). The E/e&' ratio is between 8-15, suggesting indeterminate LV filling pressure. - Aortic valve: Trileaflet. Sclerosis without stenosis. There was no regurgitation. - Mitral valve: Mildly thickened leaflets . There was trivial regurgitation. - Left atrium: Moderately dilated. - Right ventricle: The cavity size was normal. Wall thickness was normal. Systolic function was normal. Lateral annulus peak S velocity: 14.3 cm/s. - Tricuspid valve: There was mild regurgitation. - Pulmonary arteries: PA peak pressure: 29 mm Hg (S). - Inferior vena cava: The vessel was normal in size. The respirophasic diameter changes were in the normal range (>= 50%), consistent with normal central venous pressure.  08-10-17: carotid doppler:Right Carotid:  Velocities in the right ICA are consistent with a 1-39% stenosis. Left Carotid: Velocities in the left ICA are consistent with a 1-39% stenosis.  08-11-17: bilateral UUV:OZDGU: Resting right ankle-brachial index indicates moderate right lower extremity arterial disease. The right toe-brachial index is abnormal. ABIs are unreliable. Left: Resting left ankle-brachial index indicates moderate left lower extremity arterial disease. The left toe-brachial index is abnormal. ABIs are unreliable.   NO NEW EXAMS   LABS REVIEWED: PREVIOUS   08-09-17: wbc 6.3; hgb 13.7; hct 42.0; mcv 88.4 plt 384; glucose 118; bun 9; creat 0.92; k+ 3.9; na++ 141; ca 9.6; total bili 1.4; albumin 3.7 08-10-17: wbc 5.8; hgb 12.2; hct 38.5; mcv 88.7; plt 345; glucose 99; bun 8; creat 0.88; k+ 3.4; na++ 143; ca 8.5; liver normal albumin 3.1 hgb a1c 5.2; chol 101; ldl 53; trig 74; hdl 33;  tsh 0.903 08-16-17: glucose 81; bun 13.7; creat 0.62; k+ 4.5; na++ 142; ca 9.0   NO NEW LABS   Review of Systems  Constitutional: Negative for malaise/fatigue.  Respiratory: Negative for cough and shortness of breath.   Cardiovascular: Negative for chest pain, palpitations and leg swelling.  Gastrointestinal: Negative for abdominal pain, constipation and heartburn.  Musculoskeletal: Negative for back pain, joint pain and myalgias.  Skin: Negative.   Neurological: Negative for dizziness.  Psychiatric/Behavioral: The patient is not nervous/anxious.       Physical Exam  Constitutional: He is oriented to person, place, and time. He appears well-developed and well-nourished. No distress.  Neck: No thyromegaly present.  Cardiovascular: Normal rate, regular rhythm and intact distal pulses.  Murmur heard. 1/6  Pulmonary/Chest: Effort normal and breath sounds normal. No respiratory distress.  Abdominal: Soft. Bowel sounds are normal. He exhibits no distension. There is no tenderness.  Musculoskeletal: Normal range of motion. He  exhibits no edema.  Lymphadenopathy:    He has no cervical adenopathy.  Neurological: He is alert and oriented to person, place, and time.  Skin: Skin is warm. He is not diaphoretic.  Psychiatric: He has a normal mood and affect.    ASSESSMENT/ PLAN:  TODAY:   1. Dyslipidemia: stable ldl 53 will continue lipitor 20 mg daily   2. Protein calorie malnutrition: albumin 3.1; will continue ensure twice daily   3. Vascular dementia without behavioral disturbance: no change in status: will not make changes will monitor    PREVIOUS  4. Acute ischemic left MCA stroke: is neurologically stable will continue asa 81 mg daily   5. PAD:  is without change in status: will continue asa 81 mg daily   6. Essential hypertension: stable b/p 124/66: will continue lisinopril 40 mg daily     MD is aware of resident's narcotic use and is in agreement with current plan of care. We will attempt to wean resident as apropriate   Ok Edwards NP [facility]  Contact [phone] Monday through Friday 8am- 5pm  After hours call [phone]

## 2017-11-07 ENCOUNTER — Non-Acute Institutional Stay (SKILLED_NURSING_FACILITY): Payer: Medicare Other | Admitting: Adult Health

## 2017-11-07 ENCOUNTER — Encounter: Payer: Self-pay | Admitting: Adult Health

## 2017-11-07 DIAGNOSIS — I63512 Cerebral infarction due to unspecified occlusion or stenosis of left middle cerebral artery: Secondary | ICD-10-CM | POA: Diagnosis not present

## 2017-11-07 DIAGNOSIS — F0150 Vascular dementia without behavioral disturbance: Secondary | ICD-10-CM

## 2017-11-07 NOTE — Progress Notes (Signed)
Location:   [facility] Room Number: 227 A Place of Service:  SNF (31)   CODE STATUS:  DNR  No Known Allergies  Chief Complaint  Patient presents with  . Acute Visit    Care Plan Meeting    HPI:  We have come together for his routine care plan meeting. He does not family present; but is able to participate in the meeting. He is working with therapy is now using a cane. We have discussed his nursing concerns. He denies any anxiety; no change in appetite; no difficulty with urination. There are no nursing concerns at this time. We have discussed his code status; he remains a DNR. We have discussed his medications.    Past Medical History:  Diagnosis Date  . BPH (benign prostatic hyperplasia)   . Cataract   . Exertional dyspnea    with exertion  . Hypertension   . Mitral valvular regurgitation June 2014   Mild to moderate MR on echocardiogram  . PAD (peripheral artery disease) ([place])  December 2014   Lower extremity Dopplers/ABIs: RABI 0.29, LABI 0.66; bilateral external iliacs with significant diameter reduction; R. SFA occlusive disease throughout into popliteal artery with 0 vessel runoff. Anterior tibial reconstitutes distally. 70-99% reduction in all SFA. One-vessel runoff (anterior tibial)  . Stroke [facility])     Past Surgical History:  Procedure Laterality Date  . ABDOMINAL ANGIOGRAM  07/07/2013   Procedure: ABDOMINAL ANGIOGRAM;  Surgeon: Lorretta Harp, MD;  Location: [facility] CATH LAB;  Service: Cardiovascular;;  . CHOLECYSTECTOMY  07/04/2010  . ERCP N/A 11/27/2012   Procedure: ENDOSCOPIC RETROGRADE CHOLANGIOPANCREATOGRAPHY (ERCP);  Surgeon: Inda Castle, MD;  Location: [facility];  Service: Endoscopy;  Laterality: N/A;  . EYE SURGERY Right   . LEFT HEART CATHETERIZATION WITH CORONARY ANGIOGRAM N/A 07/07/2013   Procedure: LEFT HEART CATHETERIZATION WITH CORONARY ANGIOGRAM;  Surgeon: Lorretta Harp, MD;  Location: [facility] CATH LAB;  Service: Cardiovascular;   Laterality: N/A;  . LOWER EXTREMITY ANGIOGRAM N/A 07/07/2013   Procedure: LOWER EXTREMITY ANGIOGRAM;  Surgeon: Lorretta Harp, MD;  Location: [facility] CATH LAB;  Service: Cardiovascular;  Laterality: N/A;  . NM MYOVIEW LTD  December 2014   Apical thinning but no ischemia. EF 55%  . PROSTATE SURGERY     Dr. Lowella Bandy  . TRANSTHORACIC ECHOCARDIOGRAM  June 2014   Basal septal thickening with moderate LVH. EF 65%. Normal wall motion. Diastolic dysfunction/NOS. Aortic sclerosis without stenosis. Mild to moderate MR    Social History   Socioeconomic History  . Marital status: Single    Spouse name: Not on file  . Number of children: Not on file  . Years of education: Not on file  . Highest education level: Not on file  Occupational History  . Occupation: Retired    Fish farm manager: RETIRED  Social Needs  . Financial resource strain: Not on file  . Food insecurity:    Worry: Not on file    Inability: Not on file  . Transportation needs:    Medical: Not on file    Non-medical: Not on file  Tobacco Use  . Smoking status: Former Smoker    Types: Cigarettes    Last attempt to quit: 11/25/1980    Years since quitting: 36.9  . Smokeless tobacco: Never Used  Substance and Sexual Activity  . Alcohol use: Not Currently    Alcohol/week: 2.4 oz    Types: 4 Standard drinks or equivalent per week    Comment: beers  .  Drug use: No  . Sexual activity: Never  Lifestyle  . Physical activity:    Days per week: Not on file    Minutes per session: Not on file  . Stress: Not on file  Relationships  . Social connections:    Talks on phone: Not on file    Gets together: Not on file    Attends religious service: Not on file    Active member of club or organization: Not on file    Attends meetings of clubs or organizations: Not on file    Relationship status: Not on file  . Intimate partner violence:    Fear of current or ex partner: Not on file    Emotionally abused: Not on file    Physically abused:  Not on file    Forced sexual activity: Not on file  Other Topics Concern  . Not on file  Social History Narrative   He is originally from [place]. He has a history of long-standing tobacco use but quit several years ago. He also has a history of former alcohol abuse as well.   He is retired and currently single.   Family History  Problem Relation Age of Onset  . Diabetes Sister   . Cancer Sister   . Cancer Mother   . Cancer Father   . Cancer Sister       VITAL SIGNS BP 122/70   Pulse 78   Temp (!) 97.5 F (36.4 C)   Resp 16   Ht 5\' 5"  (1.651 m)   Wt 154 lb (69.9 kg)   SpO2 95%   BMI 25.63 kg/m   Outpatient Encounter Medications as of 11/07/2017  Medication Sig  . acetaminophen (TYLENOL) 325 MG tablet Take 2 tablets (650 mg total) by mouth every 6 (six) hours as needed for mild pain (or Fever >/= 101).  Marland Kitchen aspirin EC 325 MG tablet Take 1 tablet (325 mg total) by mouth daily.  Marland Kitchen atorvastatin (LIPITOR) 20 MG tablet Take 20 mg by mouth at bedtime.  . ENSURE (ENSURE) Take 237 mLs by mouth 2 (two) times daily between meals.  Marland Kitchen lisinopril (PRINIVIL,ZESTRIL) 40 MG tablet Take 1 tablet (40 mg total) by mouth daily.  . sertraline (ZOLOFT) 50 MG tablet Take 1 tablet (50 mg total) by mouth daily.   No facility-administered encounter medications on file as of 11/07/2017.      SIGNIFICANT DIAGNOSTIC EXAMS  PREVIOUS:   08-09-17: chest x-ray:No active cardiopulmonary disease. Stable mild cardiomegaly.  08-09-17: pelvic x-ray:No definite acute osseous abnormality  08-09-17: ct of head and cervical spine:1. Small left occipital scalp contusion and laceration without underlying skull fracture. 2. Cerebral atrophy with chronic small vessel ischemic disease. Wedge-shaped hypodensities in the posterior parietal lobes left greater than right without hemorrhage. These likely represent age indeterminate watershed infarcts or likely subacute to chronic. Additional small cortical infarcts  near the vertex of brain involving the high parietal lobe. 3. Cervical spondylosis without acute cervical spine fracture. 4. Lipoma deep to the left subscapularis muscle measuring at least 5.3 x 2.2 cm on axial series 8, image 79.  08-10-17: MRI of brain:Acute/subacute infarction in the posterior portion of the left MCA territory affecting the posterior temporal lobe and temporoparietal junction region. Few other scattered punctate acute infarctions in the left temporal lobe and left hemispheric deep white matter. Findings most consistent with embolic disease to the left MCA territory. Some component of watershed infarction may be present. Petechial blood products in the region of  infarction with visible thrombosed vessel on the gradient echo imaging. No frank parenchymal hematoma. No mass effect or shift. Old ischemic changes elsewhere throughout the brain as outlined Above.  08-10-17: 2-d echo:- Left ventricle: The cavity size was normal. Wall thickness was increased in a pattern of mild LVH. There was moderate focal basal hypertrophy of the septum. Systolic function was normal.The estimated ejection fraction was in the range of 60% to 65%. Doppler parameters are consistent with abnormal left ventricular relaxation (grade 1 diastolic dysfunction). The E/e&' ratio is between 8-15, suggesting indeterminate LV filling pressure. - Aortic valve: Trileaflet. Sclerosis without stenosis. There was no regurgitation. - Mitral valve: Mildly thickened leaflets . There was trivial regurgitation. - Left atrium: Moderately dilated. - Right ventricle: The cavity size was normal. Wall thickness was normal. Systolic function was normal. Lateral annulus peak S velocity: 14.3 cm/s. - Tricuspid valve: There was mild regurgitation. - Pulmonary arteries: PA peak pressure: 29 mm Hg (S). - Inferior vena cava: The vessel was normal in size. The respirophasic diameter changes were in the normal range (>= 50%), consistent  with normal central venous pressure.  08-10-17: carotid doppler:Right Carotid: Velocities in the right ICA are consistent with a 1-39% stenosis. Left Carotid: Velocities in the left ICA are consistent with a 1-39% stenosis.  08-11-17: bilateral DZH:GDJME: Resting right ankle-brachial index indicates moderate right lower extremity arterial disease. The right toe-brachial index is abnormal. ABIs are unreliable. Left: Resting left ankle-brachial index indicates moderate left lower extremity arterial disease. The left toe-brachial index is abnormal. ABIs are unreliable.   NO NEW EXAMS   LABS REVIEWED: PREVIOUS   08-09-17: wbc 6.3; hgb 13.7; hct 42.0; mcv 88.4 plt 384; glucose 118; bun 9; creat 0.92; k+ 3.9; na++ 141; ca 9.6; total bili 1.4; albumin 3.7 08-10-17: wbc 5.8; hgb 12.2; hct 38.5; mcv 88.7; plt 345; glucose 99; bun 8; creat 0.88; k+ 3.4; na++ 143; ca 8.5; liver normal albumin 3.1 hgb a1c 5.2; chol 101; ldl 53; trig 74; hdl 33;  tsh 0.903 08-16-17: glucose 81; bun 13.7; creat 0.62; k+ 4.5; na++ 142; ca 9.0   NO NEW LABS   Review of Systems  Constitutional: Negative for malaise/fatigue.  Respiratory: Negative for cough and shortness of breath.   Cardiovascular: Negative for chest pain, palpitations and leg swelling.  Gastrointestinal: Negative for abdominal pain, constipation and heartburn.  Musculoskeletal: Negative for back pain, joint pain and myalgias.  Skin: Negative.   Neurological: Negative for dizziness.  Psychiatric/Behavioral: The patient is not nervous/anxious.     Physical Exam  Constitutional: He is oriented to person, place, and time. He appears well-developed and well-nourished. No distress.  Neck: No thyromegaly present.  Cardiovascular: Normal rate, regular rhythm and intact distal pulses.  Murmur heard. 1/6  Pulmonary/Chest: Effort normal and breath sounds normal. No respiratory distress.  Abdominal: Soft. Bowel sounds are normal. He exhibits no  distension. There is no tenderness.  Musculoskeletal: Normal range of motion. He exhibits no edema.  Uses a cane   Lymphadenopathy:    He has no cervical adenopathy.  Neurological: He is alert and oriented to person, place, and time.  Skin: Skin is warm and dry. He is not diaphoretic.  Psychiatric: He has a normal mood and affect.       ASSESSMENT/ PLAN:  TODAY:   1 Vascular dementia without behavioral disturbance:  2. Acute ischemic left MCA stroke:  3. Essential hypertension  Will continue his current medication regimen Will continue therapy as directed Will not  make changes to his plan of care  Time spent with patient 30 minutes: discussed therapy; nursing needs; medications verbalized understanding.    MD is aware of resident's narcotic use and is in agreement with current plan of care. We will attempt to wean resident as apropriate   Ok Edwards NP [facility]  Contact [phone] Monday through Friday 8am- 5pm  After hours call [phone]

## 2017-11-11 DIAGNOSIS — I69320 Aphasia following cerebral infarction: Secondary | ICD-10-CM | POA: Insufficient documentation

## 2017-11-22 ENCOUNTER — Encounter: Payer: Self-pay | Admitting: Adult Health

## 2017-11-27 ENCOUNTER — Non-Acute Institutional Stay (SKILLED_NURSING_FACILITY): Payer: Medicare Other | Admitting: Adult Health

## 2017-11-27 ENCOUNTER — Encounter: Payer: Self-pay | Admitting: Adult Health

## 2017-11-27 DIAGNOSIS — I10 Essential (primary) hypertension: Secondary | ICD-10-CM

## 2017-11-27 DIAGNOSIS — I739 Peripheral vascular disease, unspecified: Secondary | ICD-10-CM

## 2017-11-27 DIAGNOSIS — I6930 Unspecified sequelae of cerebral infarction: Secondary | ICD-10-CM

## 2017-11-27 DIAGNOSIS — Z8673 Personal history of transient ischemic attack (TIA), and cerebral infarction without residual deficits: Secondary | ICD-10-CM | POA: Insufficient documentation

## 2017-11-27 NOTE — Progress Notes (Signed)
Location:   [facility] Room Number: 227 A Place of Service:  SNF (31)   CODE STATUS: DNR  No Known Allergies  Chief Complaint  Patient presents with  . Medical Management of Chronic Issues    Cva; pad; hypertension     HPI:  He is a 84 year old long term resident of this facility being seen for the management of his chronic illnesses: cva; pad; hypertension. He denies any chest pain; no headaches; no weakness. There are no nursing concerns at this time.   Past Medical History:  Diagnosis Date  . BPH (benign prostatic hyperplasia)   . Cataract   . Essential hypertension 05/08/2013  . Exertional dyspnea    with exertion  . Hypertension   . Mitral valvular regurgitation June 2014   Mild to moderate MR on echocardiogram  . PAD (peripheral artery disease) ([place])  December 2014   Lower extremity Dopplers/ABIs: RABI 0.29, LABI 0.66; bilateral external iliacs with significant diameter reduction; R. SFA occlusive disease throughout into popliteal artery with 0 vessel runoff. Anterior tibial reconstitutes distally. 70-99% reduction in all SFA. One-vessel runoff (anterior tibial)  . Stroke [facility])     Past Surgical History:  Procedure Laterality Date  . ABDOMINAL ANGIOGRAM  07/07/2013   Procedure: ABDOMINAL ANGIOGRAM;  Surgeon: Lorretta Harp, MD;  Location: [facility] CATH LAB;  Service: Cardiovascular;;  . CHOLECYSTECTOMY  07/04/2010  . ERCP N/A 11/27/2012   Procedure: ENDOSCOPIC RETROGRADE CHOLANGIOPANCREATOGRAPHY (ERCP);  Surgeon: Inda Castle, MD;  Location: [facility];  Service: Endoscopy;  Laterality: N/A;  . EYE SURGERY Right   . LEFT HEART CATHETERIZATION WITH CORONARY ANGIOGRAM N/A 07/07/2013   Procedure: LEFT HEART CATHETERIZATION WITH CORONARY ANGIOGRAM;  Surgeon: Lorretta Harp, MD;  Location: [facility] CATH LAB;  Service: Cardiovascular;  Laterality: N/A;  . LOWER EXTREMITY ANGIOGRAM N/A 07/07/2013   Procedure: LOWER EXTREMITY ANGIOGRAM;  Surgeon: Lorretta Harp,  MD;  Location: [facility] CATH LAB;  Service: Cardiovascular;  Laterality: N/A;  . NM MYOVIEW LTD  December 2014   Apical thinning but no ischemia. EF 55%  . PROSTATE SURGERY     Dr. Lowella Bandy  . TRANSTHORACIC ECHOCARDIOGRAM  June 2014   Basal septal thickening with moderate LVH. EF 65%. Normal wall motion. Diastolic dysfunction/NOS. Aortic sclerosis without stenosis. Mild to moderate MR    Social History   Socioeconomic History  . Marital status: Single    Spouse name: Not on file  . Number of children: Not on file  . Years of education: Not on file  . Highest education level: Not on file  Occupational History  . Occupation: Retired    Fish farm manager: RETIRED  Social Needs  . Financial resource strain: Not on file  . Food insecurity:    Worry: Not on file    Inability: Not on file  . Transportation needs:    Medical: Not on file    Non-medical: Not on file  Tobacco Use  . Smoking status: Former Smoker    Types: Cigarettes    Last attempt to quit: 11/25/1980    Years since quitting: 37.0  . Smokeless tobacco: Never Used  Substance and Sexual Activity  . Alcohol use: Not Currently    Alcohol/week: 2.4 oz    Types: 4 Standard drinks or equivalent per week    Comment: beers  . Drug use: No  . Sexual activity: Never  Lifestyle  . Physical activity:    Days per week: Not on file  Minutes per session: Not on file  . Stress: Not on file  Relationships  . Social connections:    Talks on phone: Not on file    Gets together: Not on file    Attends religious service: Not on file    Active member of club or organization: Not on file    Attends meetings of clubs or organizations: Not on file    Relationship status: Not on file  . Intimate partner violence:    Fear of current or ex partner: Not on file    Emotionally abused: Not on file    Physically abused: Not on file    Forced sexual activity: Not on file  Other Topics Concern  . Not on file  Social History Narrative   He is  originally from [place]. He has a history of long-standing tobacco use but quit several years ago. He also has a history of former alcohol abuse as well.   He is retired and currently single.   Family History  Problem Relation Age of Onset  . Diabetes Sister   . Cancer Sister   . Cancer Mother   . Cancer Father   . Cancer Sister       VITAL SIGNS BP 115/70   Pulse 70   Temp (!) 97.5 F (36.4 C)   Resp 16   Ht 5\' 5"  (1.651 m)   Wt 156 lb 12.8 oz (71.1 kg)   SpO2 98%   BMI 26.09 kg/m   Outpatient Encounter Medications as of 11/27/2017  Medication Sig  . acetaminophen (TYLENOL) 325 MG tablet Take 2 tablets (650 mg total) by mouth every 6 (six) hours as needed for mild pain (or Fever >/= 101).  Marland Kitchen aspirin EC 325 MG tablet Take 1 tablet (325 mg total) by mouth daily.  Marland Kitchen atorvastatin (LIPITOR) 20 MG tablet Take 20 mg by mouth at bedtime.  . ENSURE (ENSURE) Take 237 mLs by mouth 2 (two) times daily between meals.  Marland Kitchen lisinopril (PRINIVIL,ZESTRIL) 40 MG tablet Take 1 tablet (40 mg total) by mouth daily.  . sertraline (ZOLOFT) 50 MG tablet Take 1 tablet (50 mg total) by mouth daily.   No facility-administered encounter medications on file as of 11/27/2017.      SIGNIFICANT DIAGNOSTIC EXAMS  PREVIOUS:   08-09-17: chest x-ray:No active cardiopulmonary disease. Stable mild cardiomegaly.  08-09-17: pelvic x-ray:No definite acute osseous abnormality  08-09-17: ct of head and cervical spine:1. Small left occipital scalp contusion and laceration without underlying skull fracture. 2. Cerebral atrophy with chronic small vessel ischemic disease. Wedge-shaped hypodensities in the posterior parietal lobes left greater than right without hemorrhage. These likely represent age indeterminate watershed infarcts or likely subacute to chronic. Additional small cortical infarcts near the vertex of brain involving the high parietal lobe. 3. Cervical spondylosis without acute cervical spine  fracture. 4. Lipoma deep to the left subscapularis muscle measuring at least 5.3 x 2.2 cm on axial series 8, image 79.  08-10-17: MRI of brain:Acute/subacute infarction in the posterior portion of the left MCA territory affecting the posterior temporal lobe and temporoparietal junction region. Few other scattered punctate acute infarctions in the left temporal lobe and left hemispheric deep white matter. Findings most consistent with embolic disease to the left MCA territory. Some component of watershed infarction may be present. Petechial blood products in the region of infarction with visible thrombosed vessel on the gradient echo imaging. No frank parenchymal hematoma. No mass effect or shift. Old ischemic changes elsewhere throughout  the brain as outlined Above.  08-10-17: 2-d echo:- Left ventricle: The cavity size was normal. Wall thickness was increased in a pattern of mild LVH. There was moderate focal basal hypertrophy of the septum. Systolic function was normal.The estimated ejection fraction was in the range of 60% to 65%. Doppler parameters are consistent with abnormal left ventricular relaxation (grade 1 diastolic dysfunction). The E/e&' ratio is between 8-15, suggesting indeterminate LV filling pressure. - Aortic valve: Trileaflet. Sclerosis without stenosis. There was no regurgitation. - Mitral valve: Mildly thickened leaflets . There was trivial regurgitation. - Left atrium: Moderately dilated. - Right ventricle: The cavity size was normal. Wall thickness was normal. Systolic function was normal. Lateral annulus peak S velocity: 14.3 cm/s. - Tricuspid valve: There was mild regurgitation. - Pulmonary arteries: PA peak pressure: 29 mm Hg (S). - Inferior vena cava: The vessel was normal in size. The respirophasic diameter changes were in the normal range (>= 50%), consistent with normal central venous pressure.  08-10-17: carotid doppler:Right Carotid: Velocities in the right ICA are  consistent with a 1-39% stenosis. Left Carotid: Velocities in the left ICA are consistent with a 1-39% stenosis.  08-11-17: bilateral KXF:GHWEX: Resting right ankle-brachial index indicates moderate right lower extremity arterial disease. The right toe-brachial index is abnormal. ABIs are unreliable. Left: Resting left ankle-brachial index indicates moderate left lower extremity arterial disease. The left toe-brachial index is abnormal. ABIs are unreliable.   NO NEW EXAMS   LABS REVIEWED: PREVIOUS   08-09-17: wbc 6.3; hgb 13.7; hct 42.0; mcv 88.4 plt 384; glucose 118; bun 9; creat 0.92; k+ 3.9; na++ 141; ca 9.6; total bili 1.4; albumin 3.7 08-10-17: wbc 5.8; hgb 12.2; hct 38.5; mcv 88.7; plt 345; glucose 99; bun 8; creat 0.88; k+ 3.4; na++ 143; ca 8.5; liver normal albumin 3.1 hgb a1c 5.2; chol 101; ldl 53; trig 74; hdl 33;  tsh 0.903 08-16-17: glucose 81; bun 13.7; creat 0.62; k+ 4.5; na++ 142; ca 9.0   NO NEW LABS    Review of Systems  Constitutional: Negative for malaise/fatigue.  Respiratory: Negative for cough and shortness of breath.   Cardiovascular: Negative for chest pain, palpitations and leg swelling.  Gastrointestinal: Negative for abdominal pain, constipation and heartburn.  Musculoskeletal: Negative for back pain, joint pain and myalgias.  Skin: Negative.   Neurological: Negative for dizziness.  Psychiatric/Behavioral: The patient is not nervous/anxious.     Physical Exam  Constitutional: He is oriented to person, place, and time. He appears well-developed and well-nourished. No distress.  Neck: No thyromegaly present.  Cardiovascular: Normal rate, regular rhythm and intact distal pulses.  Murmur heard. 1/6  Pulmonary/Chest: Effort normal and breath sounds normal. No respiratory distress.  Abdominal: Soft. Bowel sounds are normal. He exhibits no distension. There is no tenderness.  Musculoskeletal: Normal range of motion. He exhibits no edema.  Lymphadenopathy:      He has no cervical adenopathy.  Neurological: He is alert and oriented to person, place, and time.  Skin: Skin is warm and dry. He is not diaphoretic.  Psychiatric: He has a normal mood and affect.        ASSESSMENT/ PLAN:  TODAY:   1. Chronic  ischemic left MCA stroke: is neurologically stable will continue asa 81 mg daily   2. PAD: is without change in status: will continue asa 81 mg daily   3. Essential hypertension: stable b/p 115/70: will continue lisinopril 40 mg daily   PREVIOUS  5. Dyslipidemia: stable ldl 53 will continue lipitor  20 mg daily   6. Protein calorie malnutrition: albumin 3.1; will continue ensure twice daily   7. Vascular dementia without behavioral disturbance: no change in status: will not make changes will monitor     MD is aware of resident's narcotic use and is in agreement with current plan of care. We will attempt to wean resident as apropriate   Ok Edwards NP [facility]  Contact [phone] Monday through Friday 8am- 5pm  After hours call [phone]

## 2017-11-29 ENCOUNTER — Non-Acute Institutional Stay (SKILLED_NURSING_FACILITY): Payer: Medicare Other

## 2017-11-29 DIAGNOSIS — Z0000 Encounter for general adult medical examination without abnormal findings: Secondary | ICD-10-CM | POA: Diagnosis not present

## 2017-11-29 NOTE — Patient Instructions (Signed)
[name] , Thank you for taking time to come for your Medicare Wellness Visit. I appreciate your ongoing commitment to your health goals. Please review the following plan we discussed and let me know if I can assist you in the future.   Screening recommendations/referrals: Colonoscopy excluded, over age 82 Recommended yearly ophthalmology/optometry visit for glaucoma screening and checkup Recommended yearly dental visit for hygiene and checkup  Vaccinations: Influenza vaccine up to date, due 2019 fall season Pneumococcal vaccine up to date, completed Tdap vaccine up to date, due 08/10/2027 Shingles vaccine not in past records    Advanced directives: in chart  Conditions/risks identified: none  Next appointment: Dr. Eulas Post makes rounds  Preventive Care 47 Years and Older, Male Preventive care refers to lifestyle choices and visits with your health care provider that can promote health and wellness. What does preventive care include?  A yearly physical exam. This is also called an annual well check.  Dental exams once or twice a year.  Routine eye exams. Ask your health care provider how often you should have your eyes checked.  Personal lifestyle choices, including:  Daily care of your teeth and gums.  Regular physical activity.  Eating a healthy diet.  Avoiding tobacco and drug use.  Limiting alcohol use.  Practicing safe sex.  Taking low doses of aspirin every day.  Taking vitamin and mineral supplements as recommended by your health care provider. What happens during an annual well check? The services and screenings done by your health care provider during your annual well check will depend on your age, overall health, lifestyle risk factors, and family history of disease. Counseling  Your health care provider may ask you questions about your:  Alcohol use.  Tobacco use.  Drug use.  Emotional well-being.  Home and relationship well-being.  Sexual  activity.  Eating habits.  History of falls.  Memory and ability to understand (cognition).  Work and work Statistician. Screening  You may have the following tests or measurements:  Height, weight, and BMI.  Blood pressure.  Lipid and cholesterol levels. These may be checked every 5 years, or more frequently if you are over 72 years old.  Skin check.  Lung cancer screening. You may have this screening every year starting at age 32 if you have a 30-pack-year history of smoking and currently smoke or have quit within the past 15 years.  Fecal occult blood test (FOBT) of the stool. You may have this test every year starting at age 71.  Flexible sigmoidoscopy or colonoscopy. You may have a sigmoidoscopy every 5 years or a colonoscopy every 10 years starting at age 53.  Prostate cancer screening. Recommendations will vary depending on your family history and other risks.  Hepatitis C blood test.  Hepatitis B blood test.  Sexually transmitted disease (STD) testing.  Diabetes screening. This is done by checking your blood sugar (glucose) after you have not eaten for a while (fasting). You may have this done every 1-3 years.  Abdominal aortic aneurysm (AAA) screening. You may need this if you are a current or former smoker.  Osteoporosis. You may be screened starting at age 20 if you are at high risk. Talk with your health care provider about your test results, treatment options, and if necessary, the need for more tests. Vaccines  Your health care provider may recommend certain vaccines, such as:  Influenza vaccine. This is recommended every year.  Tetanus, diphtheria, and acellular pertussis (Tdap, Td) vaccine. You may need a  Td booster every 10 years.  Zoster vaccine. You may need this after age 86.  Pneumococcal 13-valent conjugate (PCV13) vaccine. One dose is recommended after age 70.  Pneumococcal polysaccharide (PPSV23) vaccine. One dose is recommended after age  69. Talk to your health care provider about which screenings and vaccines you need and how often you need them. This information is not intended to replace advice given to you by your health care provider. Make sure you discuss any questions you have with your health care provider. Document Released: 06/18/2015 Document Revised: 02/09/2016 Document Reviewed: 03/23/2015 Elsevier Interactive Patient Education  2017 [place] Prevention in the Home Falls can cause injuries. They can happen to people of all ages. There are many things you can do to make your home safe and to help prevent falls. What can I do on the outside of my home?  Regularly fix the edges of walkways and driveways and fix any cracks.  Remove anything that might make you trip as you walk through a door, such as a raised step or threshold.  Trim any bushes or trees on the path to your home.  Use bright outdoor lighting.  Clear any walking paths of anything that might make someone trip, such as rocks or tools.  Regularly check to see if handrails are loose or broken. Make sure that both sides of any steps have handrails.  Any raised decks and porches should have guardrails on the edges.  Have any leaves, snow, or ice cleared regularly.  Use sand or salt on walking paths during winter.  Clean up any spills in your garage right away. This includes oil or grease spills. What can I do in the bathroom?  Use night lights.  Install grab bars by the toilet and in the tub and shower. Do not use towel bars as grab bars.  Use non-skid mats or decals in the tub or shower.  If you need to sit down in the shower, use a plastic, non-slip stool.  Keep the floor dry. Clean up any water that spills on the floor as soon as it happens.  Remove soap buildup in the tub or shower regularly.  Attach bath mats securely with double-sided non-slip rug tape.  Do not have throw rugs and other things on the floor that can make  you trip. What can I do in the bedroom?  Use night lights.  Make sure that you have a light by your bed that is easy to reach.  Do not use any sheets or blankets that are too big for your bed. They should not hang down onto the floor.  Have a firm chair that has side arms. You can use this for support while you get dressed.  Do not have throw rugs and other things on the floor that can make you trip. What can I do in the kitchen?  Clean up any spills right away.  Avoid walking on wet floors.  Keep items that you use a lot in easy-to-reach places.  If you need to reach something above you, use a strong step stool that has a grab bar.  Keep electrical cords out of the way.  Do not use floor polish or wax that makes floors slippery. If you must use wax, use non-skid floor wax.  Do not have throw rugs and other things on the floor that can make you trip. What can I do with my stairs?  Do not leave any items on the stairs.  Make sure that there are handrails on both sides of the stairs and use them. Fix handrails that are broken or loose. Make sure that handrails are as long as the stairways.  Check any carpeting to make sure that it is firmly attached to the stairs. Fix any carpet that is loose or worn.  Avoid having throw rugs at the top or bottom of the stairs. If you do have throw rugs, attach them to the floor with carpet tape.  Make sure that you have a light switch at the top of the stairs and the bottom of the stairs. If you do not have them, ask someone to add them for you. What else can I do to help prevent falls?  Wear shoes that:  Do not have high heels.  Have rubber bottoms.  Are comfortable and fit you well.  Are closed at the toe. Do not wear sandals.  If you use a stepladder:  Make sure that it is fully opened. Do not climb a closed stepladder.  Make sure that both sides of the stepladder are locked into place.  Ask someone to hold it for you, if  possible.  Clearly mark and make sure that you can see:  Any grab bars or handrails.  First and last steps.  Where the edge of each step is.  Use tools that help you move around (mobility aids) if they are needed. These include:  Canes.  Walkers.  Scooters.  Crutches.  Turn on the lights when you go into a dark area. Replace any light bulbs as soon as they burn out.  Set up your furniture so you have a clear path. Avoid moving your furniture around.  If any of your floors are uneven, fix them.  If there are any pets around you, be aware of where they are.  Review your medicines with your doctor. Some medicines can make you feel dizzy. This can increase your chance of falling. Ask your doctor what other things that you can do to help prevent falls. This information is not intended to replace advice given to you by your health care provider. Make sure you discuss any questions you have with your health care provider. Document Released: 03/18/2009 Document Revised: 10/28/2015 Document Reviewed: 06/26/2014 Elsevier Interactive Patient Education  2017 [employer].

## 2017-11-29 NOTE — Progress Notes (Signed)
Subjective:   [name] is a 84 y.o. male who presents for Medicare Annual/Subsequent preventive examination at [facility]  Last AWV-05/11/2016       Objective:    Vitals: BP 120/68 (BP Location: Left Arm, Patient Position: Sitting)   Pulse 68   Temp 98.3 F (36.8 C) (Oral)   Ht 5\' 5"  (1.651 m)   Wt 156 lb (70.8 kg)   BMI 25.96 kg/m   Body mass index is 25.96 kg/m.  Advanced Directives 11/29/2017 11/27/2017 11/07/2017 10/31/2017 09/18/2017 08/23/2017 08/17/2017  Does Patient Have a Medical Advance Directive? Yes Yes Yes Yes Yes Yes Yes  Type of Advance Directive Out of facility DNR (pink MOST or yellow form);[place];Living will Out of facility DNR (pink MOST or yellow form);[facility];Living will Out of facility DNR (pink MOST or yellow form);[place];Living will Out of facility DNR (pink MOST or yellow form);[place];Living will Out of facility DNR (pink MOST or yellow form) Out of facility DNR (pink MOST or yellow form) Out of facility DNR (pink MOST or yellow form)  Does patient want to make changes to medical advance directive? No - Patient declined No - Patient declined No - Patient declined No - Patient declined No - Patient declined No - Patient declined No - Patient declined  Copy of [place] in Chart? Yes Yes Yes - - - -  Would patient like information on creating a medical advance directive? No - Patient declined No - Patient declined - No - Patient declined No - Patient declined No - Patient declined No - Patient declined  Pre-existing out of facility DNR order (yellow form or pink MOST form) Yellow form placed in chart (order not valid for inpatient use) Yellow form placed in chart (order not valid for inpatient use) Yellow form placed in chart (order not valid for inpatient use) Yellow form placed in chart (order not valid for inpatient use) Yellow form placed in  chart (order not valid for inpatient use) Yellow form placed in chart (order not valid for inpatient use) Yellow form placed in chart (order not valid for inpatient use)    Tobacco Social History   Tobacco Use  Smoking Status Former Smoker  . Types: Cigarettes  . Last attempt to quit: 11/25/1980  . Years since quitting: 37.0  Smokeless Tobacco Never Used     Counseling given: Not Answered   Clinical Intake:  Pre-visit preparation completed: No  Pain : No/denies pain     Nutritional Risks: None Diabetes: No  How often do you need to have someone help you when you read instructions, pamphlets, or other written materials from your doctor or pharmacy?: 3 - Sometimes  Interpreter Needed?: No  Information entered by :: Tyson Dense, RN  Past Medical History:  Diagnosis Date  . BPH (benign prostatic hyperplasia)   . Cataract   . Essential hypertension 05/08/2013  . Exertional dyspnea    with exertion  . Hypertension   . Mitral valvular regurgitation June 2014   Mild to moderate MR on echocardiogram  . PAD (peripheral artery disease) ([place])  December 2014   Lower extremity Dopplers/ABIs: RABI 0.29, LABI 0.66; bilateral external iliacs with significant diameter reduction; R. SFA occlusive disease throughout into popliteal artery with 0 vessel runoff. Anterior tibial reconstitutes distally. 70-99% reduction in all SFA. One-vessel runoff (anterior tibial)  . Stroke [facility])    Past Surgical History:  Procedure  Laterality Date  . ABDOMINAL ANGIOGRAM  07/07/2013   Procedure: ABDOMINAL ANGIOGRAM;  Surgeon: Lorretta Harp, MD;  Location: [facility] CATH LAB;  Service: Cardiovascular;;  . CHOLECYSTECTOMY  07/04/2010  . ERCP N/A 11/27/2012   Procedure: ENDOSCOPIC RETROGRADE CHOLANGIOPANCREATOGRAPHY (ERCP);  Surgeon: Inda Castle, MD;  Location: [facility];  Service: Endoscopy;  Laterality: N/A;  . EYE SURGERY Right   . LEFT HEART CATHETERIZATION WITH CORONARY ANGIOGRAM N/A 07/07/2013    Procedure: LEFT HEART CATHETERIZATION WITH CORONARY ANGIOGRAM;  Surgeon: Lorretta Harp, MD;  Location: [facility] CATH LAB;  Service: Cardiovascular;  Laterality: N/A;  . LOWER EXTREMITY ANGIOGRAM N/A 07/07/2013   Procedure: LOWER EXTREMITY ANGIOGRAM;  Surgeon: Lorretta Harp, MD;  Location: [facility] CATH LAB;  Service: Cardiovascular;  Laterality: N/A;  . NM MYOVIEW LTD  December 2014   Apical thinning but no ischemia. EF 55%  . PROSTATE SURGERY     Dr. Lowella Bandy  . TRANSTHORACIC ECHOCARDIOGRAM  June 2014   Basal septal thickening with moderate LVH. EF 65%. Normal wall motion. Diastolic dysfunction/NOS. Aortic sclerosis without stenosis. Mild to moderate MR   Family History  Problem Relation Age of Onset  . Diabetes Sister   . Cancer Sister   . Cancer Mother   . Cancer Father   . Cancer Sister    Social History   Socioeconomic History  . Marital status: Single    Spouse name: Not on file  . Number of children: Not on file  . Years of education: Not on file  . Highest education level: Not on file  Occupational History  . Occupation: Retired    Fish farm manager: RETIRED  Social Needs  . Financial resource strain: Not hard at all  . Food insecurity:    Worry: Never true    Inability: Never true  . Transportation needs:    Medical: No    Non-medical: No  Tobacco Use  . Smoking status: Former Smoker    Types: Cigarettes    Last attempt to quit: 11/25/1980    Years since quitting: 37.0  . Smokeless tobacco: Never Used  Substance and Sexual Activity  . Alcohol use: Not Currently    Alcohol/week: 2.4 oz    Types: 4 Standard drinks or equivalent per week    Comment: beers  . Drug use: No  . Sexual activity: Never  Lifestyle  . Physical activity:    Days per week: 0 days    Minutes per session: 0 min  . Stress: Not at all  Relationships  . Social connections:    Talks on phone: Once a week    Gets together: Once a week    Attends religious service: Never    Active member of club or  organization: No    Attends meetings of clubs or organizations: Never    Relationship status: Never married  Other Topics Concern  . Not on file  Social History Narrative   He is originally from [place]. He has a history of long-standing tobacco use but quit several years ago. He also has a history of former alcohol abuse as well.   He is retired and currently single.    Outpatient Encounter Medications as of 11/29/2017  Medication Sig  . acetaminophen (TYLENOL) 325 MG tablet Take 2 tablets (650 mg total) by mouth every 6 (six) hours as needed for mild pain (or Fever >/= 101).  Marland Kitchen aspirin EC 325 MG tablet Take 1 tablet (325 mg total) by mouth daily.  Marland Kitchen atorvastatin (  LIPITOR) 20 MG tablet Take 20 mg by mouth at bedtime.  . ENSURE (ENSURE) Take 237 mLs by mouth 2 (two) times daily between meals.  Marland Kitchen lisinopril (PRINIVIL,ZESTRIL) 40 MG tablet Take 1 tablet (40 mg total) by mouth daily.  . sertraline (ZOLOFT) 50 MG tablet Take 1 tablet (50 mg total) by mouth daily.   No facility-administered encounter medications on file as of 11/29/2017.     Activities of Daily Living In your present state of health, do you have any difficulty performing the following activities: 11/29/2017 08/10/2017  Hearing? N N  Vision? N Y  Difficulty concentrating or making decisions? Tempie Donning  Walking or climbing stairs? Y Y  Dressing or bathing? Y Y  Doing errands, shopping? Y Y  Comment - "can't drive; no familyChief Executive Officer and eating ? Y -  Using the Toilet? Y -  In the past six months, have you accidently leaked urine? N -  Do you have problems with loss of bowel control? N -  Managing your Medications? Y -  Managing your Finances? Y -  Housekeeping or managing your Housekeeping? Y -  Some recent data might be hidden    Patient Care Team: Patient, [name] as PCP - General (General Practice) Lowella Bandy, MD as Attending Physician (Urology)   Assessment:   This is a routine wellness examination for  [name].  Exercise Activities and Dietary recommendations Current Exercise Habits: The patient does not participate in regular exercise at present, Exercise limited by: neurologic condition(s);orthopedic condition(s)  Goals    None      Fall Risk Fall Risk  11/29/2017 10/30/2017 11/09/2016 10/10/2016 05/11/2016  Falls in the past year? Yes No No No No  Number falls in past yr: 1 - - - -  Injury with Fall? No - - - -   Is the patient's home free of loose throw rugs in walkways, pet beds, electrical cords, etc?   yes      Grab bars in the bathroom? yes      Handrails on the stairs?   yes      Adequate lighting?   yes   Depression Screen PHQ 2/9 Scores 11/29/2017 11/09/2016 10/10/2016 05/11/2016  PHQ - 2 Score 0 0 0 0    Cognitive Function     6CIT Screen 11/29/2017  What Year? 4 points  What month? 3 points  What time? 0 points  Count back from 20 0 points  Months in reverse 4 points  Repeat phrase 6 points  Total Score 17    Immunization History  Administered Date(s) Administered  . Influenza Split 02/08/2013, 02/03/2014, 03/05/2015  . Influenza, High Dose Seasonal PF 02/27/2017  . Pneumococcal Conjugate-13 11/11/2015  . Pneumococcal Polysaccharide-23 03/04/2014  . Tdap 08/09/2017    Qualifies for Shingles Vaccine? Not in past records  Screening Tests Health Maintenance  Topic Date Due  . INFLUENZA VACCINE  01/03/2018  . TETANUS/TDAP  08/10/2027  . PNA vac Low Risk Adult  Completed   Cancer Screenings: Lung: Low Dose CT Chest recommended if Age 79-80 years, 30 pack-year currently smoking OR have quit w/in 15years. Patient does not qualify. Colorectal: up to date  Additional Screenings:  Hepatitis C Screening:declined      Plan:    I have personally reviewed and addressed the Medicare Annual Wellness questionnaire and have noted the following in the patient's chart:  A. Medical and social history B. Use of alcohol, tobacco or illicit drugs  C.  Current medications  and supplements D. Functional ability and status E.  Nutritional status F.  Physical activity G. Advance directives H. List of other physicians I.  Hospitalizations, surgeries, and ER visits in previous 12 months J.  [place] to include hearing, vision, cognitive, depression L. Referrals and appointments - none  In addition, I have reviewed and discussed with patient certain preventive protocols, quality metrics, and best practice recommendations. A written personalized care plan for preventive services as well as general preventive health recommendations were provided to patient.  See attached scanned questionnaire for additional information.   Signed,   Tyson Dense, RN Nurse Health Advisor  Patient Concerns: None

## 2017-12-04 ENCOUNTER — Telehealth: Payer: Self-pay

## 2017-12-04 NOTE — Telephone Encounter (Signed)
Notes recorded by Marval Regal, RN on 12/04/2017 at 9:53 AM EDT Left vm for patients HCPOA Masiah Woody to call back about pts cardiac event monitor. ------

## 2017-12-04 NOTE — Telephone Encounter (Signed)
-----   Message from Rosalin Hawking, MD sent at 12/03/2017  2:01 PM EDT ----- Could you please let the patient know that the heart monitoring test done recently was negative for arrhythmia called afib. Therefore, please continue current treatment. Thanks.  Rosalin Hawking, MD PhD Stroke Neurology 12/03/2017 2:01 PM

## 2017-12-10 NOTE — Telephone Encounter (Signed)
Notes recorded by Marval Regal, RN on 12/10/2017 at 12:47 PM EDT Left 2nd vm for patients POA to call back about cardiac monitor. ------

## 2018-01-07 ENCOUNTER — Encounter: Payer: Self-pay | Admitting: Internal Medicine

## 2018-01-07 ENCOUNTER — Non-Acute Institutional Stay (SKILLED_NURSING_FACILITY): Payer: Medicare Other | Admitting: Internal Medicine

## 2018-01-07 DIAGNOSIS — I10 Essential (primary) hypertension: Secondary | ICD-10-CM

## 2018-01-07 DIAGNOSIS — E440 Moderate protein-calorie malnutrition: Secondary | ICD-10-CM

## 2018-01-07 DIAGNOSIS — I739 Peripheral vascular disease, unspecified: Secondary | ICD-10-CM | POA: Diagnosis not present

## 2018-01-07 DIAGNOSIS — R600 Localized edema: Secondary | ICD-10-CM | POA: Diagnosis not present

## 2018-01-07 DIAGNOSIS — Z8673 Personal history of transient ischemic attack (TIA), and cerebral infarction without residual deficits: Secondary | ICD-10-CM

## 2018-01-07 DIAGNOSIS — E785 Hyperlipidemia, unspecified: Secondary | ICD-10-CM

## 2018-01-07 DIAGNOSIS — F0150 Vascular dementia without behavioral disturbance: Secondary | ICD-10-CM

## 2018-01-07 NOTE — Progress Notes (Signed)
Patient ID: [name], male   DOB: 01/15/1930, 84 y.o.   MRN: [id number]   Location:  [facility] Room Number: 956 A Place of Service:  SNF (31) Provider:  DR Arletha Grippe  Patient, [name]  Patient Care Team: Patient, [name] as PCP - General (General Practice) Lowella Bandy, MD as Attending Physician (Urology)  Extended Emergency Contact Information Primary Emergency Contact: Jeronimo Greaves [place] of [place] Phone: [phone] Relation: Friend Secondary Emergency Contact: Terrilyn Saver Relation: Friend  Code Status:  DNR Goals of care: Advanced Directive information Advanced Directives 01/07/2018  Does Patient Have a Medical Advance Directive? Yes  Type of Advance Directive Out of facility DNR (pink MOST or yellow form);[place];Living will  Does patient want to make changes to medical advance directive? No - Patient declined  Copy of [place] in Chart? Yes  Would patient like information on creating a medical advance directive? -  Pre-existing out of facility DNR order (yellow form or pink MOST form) Yellow form placed in chart (order not valid for inpatient use)     Chief Complaint  Patient presents with  . Medical Management of Chronic Issues    [facility]    HPI:  Pt is a 84 y.o. male seen today for medical management of chronic diseases.  He reports GI upset improved on medication. No further diarrhea. He has chronic LE edema. No new weakness, f/c, CP or SOB. He is a poor historian due to dementia. Hx obtained from chart.  Chronic  ischemic left MCA stroke - stable on ASA 81 mg daily   PAD - stable on ASA 81 mg daily   HTN - stable on lisinopril 40 mg daily   Dyslipidemia - stable on lipitor 20 mg daily; LDL 53  Protein calorie malnutrition - he gets ensure twice daily; albumin 3.1  Vascular dementia - stable without cognition med   Past Medical History:  Diagnosis Date  . BPH  (benign prostatic hyperplasia)   . Cataract   . Essential hypertension 05/08/2013  . Exertional dyspnea    with exertion  . Hypertension   . Mitral valvular regurgitation June 2014   Mild to moderate MR on echocardiogram  . PAD (peripheral artery disease) ([place] [place])  December 2014   Lower extremity Dopplers/ABIs: RABI 0.29, LABI 0.66; bilateral external iliacs with significant diameter reduction; R. SFA occlusive disease throughout into popliteal artery with 0 vessel runoff. Anterior tibial reconstitutes distally. 70-99% reduction in all SFA. One-vessel runoff (anterior tibial)  . Stroke [facility] [facility])    Past Surgical History:  Procedure Laterality Date  . ABDOMINAL ANGIOGRAM  07/07/2013   Procedure: ABDOMINAL ANGIOGRAM;  Surgeon: Lorretta Harp, MD;  Location: [facility] CATH LAB;  Service: Cardiovascular;;  . CHOLECYSTECTOMY  07/04/2010  . ERCP N/A 11/27/2012   Procedure: ENDOSCOPIC RETROGRADE CHOLANGIOPANCREATOGRAPHY (ERCP);  Surgeon: Inda Castle, MD;  Location: [facility] ENDOS[facility];  Service: Endoscopy;  Laterality: N/A;  . EYE SURGERY Right   . LEFT HEART CATHETERIZATION WITH CORONARY ANGIOGRAM N/A 07/07/2013   Procedure: LEFT HEART CATHETERIZATION WITH CORONARY ANGIOGRAM;  Surgeon: Lorretta Harp, MD;  Location: [facility] CATH LAB;  Service: Cardiovascular;  Laterality: N/A;  . LOWER EXTREMITY ANGIOGRAM N/A 07/07/2013   Procedure: LOWER EXTREMITY ANGIOGRAM;  Surgeon: Lorretta Harp, MD;  Location[facility] CATH LAB;  Service: Cardiovascular;  Laterality: N/A;  . NM MYOVIEW LTD  December 2014   Apical thinning but no ischemia. EF 55%  .  PROSTATE SURGERY     Dr. Lowella Bandy  . TRANSTHORACIC ECHOCARDIOGRAM  June 2014   Basal septal thickening with moderate LVH. EF 65%. Normal wall motion. Diastolic dysfunction/NOS. Aortic sclerosis without stenosis. Mild to moderate MR    No Known Allergies  Outpatient Encounter Medications as of 01/07/2018  Medication Sig  . acetaminophen (TYLENOL) 325 MG tablet Take 2 tablets (650 mg  total) by mouth every 6 (six) hours as needed for mild pain (or Fever >/= 101).  Marland Kitchen aspirin EC 325 MG tablet Take 1 tablet (325 mg total) by mouth daily.  Marland Kitchen atorvastatin (LIPITOR) 20 MG tablet Take 20 mg by mouth at bedtime.  . Cholecalciferol 1000 units tablet Take 1,000 Units by mouth daily.  Marland Kitchen ENSURE (ENSURE) Take 237 mLs by mouth 2 (two) times daily between meals.  Marland Kitchen lisinopril (PRINIVIL,ZESTRIL) 40 MG tablet Take 1 tablet (40 mg total) by mouth daily.  . Psyllium 28.3 % POWD Give 1 scoop by mouth daily for constipation  . sertraline (ZOLOFT) 50 MG tablet Take 1 tablet (50 mg total) by mouth daily.   No facility-administered encounter medications on file as of 01/07/2018.     Review of Systems  Unable to perform ROS: Dementia    Immunization History  Administered Date(s) Administered  . Influenza Split 02/08/2013, 02/03/2014, 03/05/2015  . Influenza, High Dose Seasonal PF 02/27/2017  . Pneumococcal Conjugate-13 11/11/2015  . Pneumococcal Polysaccharide-23 03/04/2014  . Tdap 08/09/2017   Pertinent  Health Maintenance Due  Topic Date Due  . INFLUENZA VACCINE  04/09/2018 (Originally 01/03/2018)  . PNA vac Low Risk Adult  Completed   Fall Risk  11/29/2017 10/30/2017 11/09/2016 10/10/2016 05/11/2016  Falls in the past year? Yes No No No No  Number falls in past yr: 1 - - - -  Injury with Fall? No - - - -   Functional Status Survey:    Vitals:   01/07/18 1029  Weight: 160 lb (72.6 kg)  Height: 5\' 5"  (1.651 m)  Blood pressure:   120/70 Pulse:                   79 Temp:                   97.3 F  Body mass index is 26.63 kg/m. Physical Exam  Constitutional: He appears well-developed and well-nourished.  Sitting in w/c at bedside in NAD  HENT:  Mouth/Throat: Oropharynx is clear and moist.  MMM; no oral thrush  Eyes: Pupils are equal, round, and reactive to light. No scleral icterus.  Neck: Neck supple. Carotid bruit is not present. No thyromegaly present.  Cardiovascular: Normal  rate, regular rhythm and intact distal pulses. Exam reveals no gallop and no friction rub.  Murmur (2/6 SEM) heard. +1 pitting LE edema b/l; no calf TTP  Pulmonary/Chest: Effort normal. He has no wheezes. He has rales (left basilar inspiratory). He exhibits no tenderness.  Abdominal: Soft. Bowel sounds are normal. He exhibits no distension, no abdominal bruit, no pulsatile midline mass and no mass. There is no hepatomegaly. There is no tenderness. There is no rebound and no guarding.  obese  Musculoskeletal: He exhibits edema (smalll and large joints).  Lymphadenopathy:    He has no cervical adenopathy.  Neurological: He is alert. He has normal reflexes.  Skin: Skin is warm and dry. No rash noted.  Psychiatric: He has a normal mood and affect. His behavior is normal. Thought content normal.    Labs reviewed:  Recent Labs    08/09/17 2032 08/09/17 2043 08/10/17 0548 08/16/17  NA 141 143 143 142  K 3.9 3.7 3.4* 4.5  CL 103 101 107  --   CO2 27  --  26  --   GLUCOSE 118* 114* 99  --   BUN 9 11 8 14   CREATININE 0.92 0.80 0.88 0.6  CALCIUM 9.6  --  8.5*  --    Recent Labs    08/09/17 2032 08/10/17 0548  AST 21 12*  ALT 9* 8*  ALKPHOS 95 82  BILITOT 1.4* 0.6  PROT 5.9* 5.3*  ALBUMIN 3.7 3.1*   Recent Labs    08/09/17 2032 08/09/17 2043 08/10/17 0548  WBC 6.3  --  5.8  NEUTROABS 4.7  --   --   HGB 13.7 14.3 12.2*  HCT 42.0 42.0 38.5*  MCV 88.4  --  88.7  PLT 384  --  345   Lab Results  Component Value Date   TSH 0.903 08/10/2017   Lab Results  Component Value Date   HGBA1C 5.2 08/10/2017   Lab Results  Component Value Date   CHOL 101 08/10/2017   HDL 33 (L) 08/10/2017   LDLCALC 53 08/10/2017   TRIG 74 08/10/2017   CHOLHDL 3.1 08/10/2017    Significant Diagnostic Results in last 30 days:  No results found.  Assessment/Plan   ICD-10-CM   1. Bilateral lower extremity edema R60.0   2. PAD (peripheral artery disease) (HCC) I73.9   3. Essential  hypertension I10   4. History of stroke Z86.73   5. Vascular dementia without behavioral disturbance F01.50   6. Moderate protein-calorie malnutrition (HCC) E44.0   7. Dyslipidemia E78.5      Cont current meds as ordered  PT/OT/ST as ordered  Nutritional supplements as indicated  [facility] NP to follow  Will follow  Labs/tests ordered: none   Mattingly Fountaine S. Perlie Gold  [facility] and Adult [facility] [address] [place], [place] [address] [phone] Cell (Monday-Friday 8 AM - 5 PM) [phone] After 5 PM and follow prompts

## 2018-01-30 ENCOUNTER — Encounter: Payer: Self-pay | Admitting: Adult Health

## 2018-01-30 ENCOUNTER — Telehealth: Payer: Self-pay | Admitting: Adult Health

## 2018-01-30 ENCOUNTER — Telehealth: Payer: Self-pay

## 2018-01-30 ENCOUNTER — Ambulatory Visit (INDEPENDENT_AMBULATORY_CARE_PROVIDER_SITE_OTHER): Payer: Medicare Other | Admitting: Adult Health

## 2018-01-30 VITALS — BP 140/76 | HR 65 | Ht 65.0 in | Wt 160.0 lb

## 2018-01-30 DIAGNOSIS — R29818 Other symptoms and signs involving the nervous system: Secondary | ICD-10-CM

## 2018-01-30 DIAGNOSIS — I10 Essential (primary) hypertension: Secondary | ICD-10-CM | POA: Diagnosis not present

## 2018-01-30 DIAGNOSIS — E785 Hyperlipidemia, unspecified: Secondary | ICD-10-CM | POA: Diagnosis not present

## 2018-01-30 DIAGNOSIS — Z8673 Personal history of transient ischemic attack (TIA), and cerebral infarction without residual deficits: Secondary | ICD-10-CM | POA: Diagnosis not present

## 2018-01-30 NOTE — Telephone Encounter (Signed)
RN call Dr. Nadyne Coombes office to get fax number about possible referral for cardiology consult. Rn was given fax number of [phone]. Rn stated copy of EKG,and note from [place] NP will be attached. RN also said office notes were sent to Dr. Nadyne Coombes.

## 2018-01-30 NOTE — Telephone Encounter (Signed)
Faxed the EKG to Dr. Kennon Holter office on [facility]. Janett Billow NP also said that she would send her note to Dr. Gwenlyn Found for review. Received a receipt of confirmation.

## 2018-01-30 NOTE — Progress Notes (Addendum)
NEUROLOGY CLINIC NEW CONSULT NOTE  NAME: [name] [name] [name] DOB: 04/08/1930 REFERRING PHYSICIAN: No ref. provider found  I saw [name] [name] [name] as a new consult in the neurovascular clinic today regarding  Chief Complaint  Patient presents with  . Follow-up    Stroke follow up room 9 pt with HCPOA [name] [name] [name] , pt at [facility]   .  HPI: [name] [name] [name] is a 84 y.o. male with PMH of [name] [name], [name] [name] [name], [name] dementia and PAD who is being seen today for follow-up of his stroke on 08/09/2017. Patient cannot remember what happened that day but POA stated that apparently he has vision difficulties not able to drive, he went to bus stop to catch a bus but was found down at the bus stop. Admitted to [facility] on 08/09/17 for work up of syncope/collapse but MRI found to have left inferior MCA acute infarct as well as left MCA/ACA punctate infarcts. CUS and TTE unremarkable. LDL 53 and A1C 5.2. Cardiac work up unrevealing. Discharged with ASA 81mg  and Lipitor 80mg .  08/22/2017 visit JX: During the interval time, pt back to [name]. No recurrent stroke or syncope. He can not remember the event.  He has been following with cardiology for [name] [name] [name], and PAD, so far stable.  BP today 140/75.  Denies palpitation, or heart racing.  10/22/2017 visit JX: During the interval time, pt has been doing well. No recurrent symptoms. Had MRA head showed mild atherosclerosis but no significant stenosis. Had 30 day cardiac monitoring and will complete in 2 days. He is on ASA 325mg  and lipitor 20 without side effect. Denies palpitation and heart racing. BP today 139/73. Use walker to walk but able to walk without device in facility. Staples on scalp have been removed. No other complains.   Interval history 01/30/2018: Patient is being seen today for follow-up visit and is accompanied by his friend [name] [name] [name] ([place]).  Since previous visit, patient did have a 30-day cardiac monitor which was negative  for arrhythmia or atrial fibrillation.  He is currently residing at [facility], [place].  Patient is being seen today for scheduled office visit without complaints or concerns.  Denies residual deficits from his previous stroke.  He no longer is doing PT/OT at his facility.  He continues to take aspirin 325 mg without side effects of bleeding or bruising.  Continues to take Lipitor without side effects myalgias.  Blood pressure today satisfactory 140/76.  Denies new or worsening stroke/TIA symptoms.  Upon assessment, left hemiparesis noted along with irregular heart rhythm.  Patient not aware of left-sided weakness nor was this a case at prior follow-up appointments or during hospitalization.  Patient is prior stroke showed left MCA/ACA punctate infarcts so new left-sided weakness does not correlate with old infarct.  Reviewed 30-day cardiac monitor and was negative for any type of irregularity or arrhythmia but did show sinus rhythm with very frequent PACs.  EKG obtained at appointment to rule out possible atrial fibrillation and per interpretation, showed unusual P axis with possible ectopic atrial tachycardia with undetermined rhythm irregularity and left anterior fascicular block.  As atrial fibrillation was not found, recommend MRI brain to rule out possible new infarct to explain new left-sided weakness.     Past Medical History:  Diagnosis Date  . BPH (benign prostatic hyperplasia)   . Cataract   . Essential [name] [name] 05/08/2013  . Exertional dyspnea    with exertion  . [name] [name]   . Mitral valvular regurgitation June 2014  Mild to moderate MR on echocardiogram  . PAD (peripheral artery disease) ([place])  December 2014   Lower extremity Dopplers/ABIs: RABI 0.29, LABI 0.66; bilateral external iliacs with significant diameter reduction; R. SFA occlusive disease throughout into popliteal artery with 0 vessel runoff. Anterior tibial reconstitutes distally. 70-99% reduction in all SFA.  One-vessel runoff (anterior tibial)  . Stroke [facility])    Past Surgical History:  Procedure Laterality Date  . ABDOMINAL ANGIOGRAM  07/07/2013   Procedure: ABDOMINAL ANGIOGRAM;  Surgeon: Lorretta Harp, MD;  Location: [facility] CATH LAB;  Service: Cardiovascular;;  . CHOLECYSTECTOMY  07/04/2010  . ERCP N/A 11/27/2012   Procedure: ENDOSCOPIC RETROGRADE CHOLANGIOPANCREATOGRAPHY (ERCP);  Surgeon: Inda Castle, MD;  Location: [facility];  Service: Endoscopy;  Laterality: N/A;  . EYE SURGERY Right   . LEFT HEART CATHETERIZATION WITH CORONARY ANGIOGRAM N/A 07/07/2013   Procedure: LEFT HEART CATHETERIZATION WITH CORONARY ANGIOGRAM;  Surgeon: Lorretta Harp, MD;  Location: [facility] CATH LAB;  Service: Cardiovascular;  Laterality: N/A;  . LOWER EXTREMITY ANGIOGRAM N/A 07/07/2013   Procedure: LOWER EXTREMITY ANGIOGRAM;  Surgeon: Lorretta Harp, MD;  Location: [facility] CATH LAB;  Service: Cardiovascular;  Laterality: N/A;  . NM MYOVIEW LTD  December 2014   Apical thinning but no ischemia. EF 55%  . PROSTATE SURGERY     Dr. Lowella Bandy  . TRANSTHORACIC ECHOCARDIOGRAM  June 2014   Basal septal thickening with moderate LVH. EF 65%. Normal wall motion. Diastolic dysfunction/NOS. Aortic sclerosis without stenosis. Mild to moderate MR   Family History  Problem Relation Age of Onset  . Diabetes Sister   . Cancer Sister   . Cancer Mother   . Cancer Father   . Cancer Sister    Current Outpatient Medications  Medication Sig Dispense Refill  . acetaminophen (TYLENOL) 325 MG tablet Take 2 tablets (650 mg total) by mouth every 6 (six) hours as needed for mild pain (or Fever >/= 101).    Marland Kitchen aspirin EC 325 MG tablet Take 1 tablet (325 mg total) by mouth daily. 30 tablet 0  . atorvastatin (LIPITOR) 20 MG tablet Take 20 mg by mouth at bedtime.    . Cholecalciferol 1000 units tablet Take 1,000 Units by mouth daily.    Marland Kitchen ENSURE (ENSURE) Take 237 mLs by mouth 2 (two) times daily between meals.    Marland Kitchen lisinopril (PRINIVIL,ZESTRIL) 40  MG tablet Take 1 tablet (40 mg total) by mouth daily.  5  . Psyllium 28.3 % POWD Give 1 scoop by mouth daily for constipation    . sertraline (ZOLOFT) 50 MG tablet Take 1 tablet (50 mg total) by mouth daily.     No current facility-administered medications for this visit.    No Known Allergies Social History   Socioeconomic History  . Marital status: Single    Spouse name: Not on file  . Number of children: Not on file  . Years of education: Not on file  . Highest education level: Not on file  Occupational History  . Occupation: Retired    Fish farm manager: RETIRED  Social Needs  . Financial resource strain: Not hard at all  . Food insecurity:    Worry: Never true    Inability: Never true  . Transportation needs:    Medical: No    Non-medical: No  Tobacco Use  . Smoking status: Former Smoker    Types: Cigarettes    Last attempt to quit: 11/25/1980    Years since quitting: 37.2  . Smokeless tobacco: Never  Used  Substance and Sexual Activity  . Alcohol use: Not Currently    Alcohol/week: 4.0 standard drinks    Types: 4 Standard drinks or equivalent per week    Comment: beers  . Drug use: No  . Sexual activity: Never  Lifestyle  . Physical activity:    Days per week: 0 days    Minutes per session: 0 min  . Stress: Not at all  Relationships  . Social connections:    Talks on phone: Once a week    Gets together: Once a week    Attends religious service: Never    Active member of club or organization: No    Attends meetings of clubs or organizations: Never    Relationship status: Never married  . Intimate partner violence:    Fear of current or ex partner: No    Emotionally abused: No    Physically abused: No    Forced sexual activity: No  Other Topics Concern  . Not on file  Social History Narrative   He is originally from [place]. He has a history of long-standing tobacco use but quit several years ago. He also has a history of former alcohol abuse as well.   He is  retired and currently single.    Review of Systems Full 14 system review of systems performed and notable only for those listed, all others are neg:  No complaints by patient  Physical Exam  Vitals:   01/30/18 0959  BP: 140/76  Pulse: 65    General - Well nourished, well developed, frail pleasant elderly AA male, in no apparent distress.  Ophthalmologic - fundi not visualized due to noncooperation.  Cardiovascular - Iregular rate and rhythm.  Neck - supple, no nuchal rigidity .  Mental Status -  Level of arousal and orientation intact x 3. Language including expression, naming, repetition, comprehension, reading, and writing was assessed and found intact.  Cranial Nerves II - XII - II - Visual field intact OU. III, IV, VI - Extraocular movements intact. V - Facial sensation intact bilaterally. VII - Facial movement intact bilaterally. VIII - Hearing & vestibular intact bilaterally. X - Palate elevates symmetrically. XI - Chin turning & shoulder shrug intact bilaterally. XII - Tongue protrusion intact.  Motor Strength - LUE: 4/5, LLE: 4-/5; RUE and RLE: 5/5  Motor Tone - Muscle tone was assessed at the neck and appendages and was normal.  Reflexes - The patient's reflexes were normal in all extremities and he had no pathological reflexes.  Sensory - Light touch, temperature/pinprick were assessed and were normal.    Coordination - The patient had normal movements in the hands with no ataxia or dysmetria.  Tremor was absent.  Orbits right arm over left arm.  Decreased left hand finger dexterity.  Gait and Station - ambulation with cane with mild hemiplegic gait   Imaging  I have personally reviewed the radiological images below and agree with the radiology interpretations.  Ct Head Wo Contrast 08/09/2017 IMPRESSION: 1. Small left occipital scalp contusion and laceration without underlying skull fracture. 2. Cerebral atrophy with chronic small vessel ischemic  disease. Wedge-shaped hypodensities in the posterior parietal lobes left greater than right without hemorrhage. These likely represent age indeterminate watershed infarcts or likely subacute to chronic. Additional small cortical infarcts near the vertex of brain involving the high parietal lobe. 3. Cervical spondylosis without acute cervical spine fracture. 4. Lipoma deep to the left subscapularis muscle measuring at least 5.3 x 2.2 cm on  axial series 8, image 79.   Mr Brain Wo Contrast 08/10/2017 IMPRESSION: Acute/subacute infarction in the posterior portion of the left MCA territory affecting the posterior temporal lobe and temporoparietal junction region. Few other scattered punctate acute infarctions in the left temporal lobe and left hemispheric deep white matter. Findings most consistent with embolic disease to the left MCA territory. Some component of watershed infarction may be present. Petechial blood products in the region of infarction with visible thrombosed vessel on the gradient echo imaging. No frank parenchymal hematoma. No mass effect or shift. Old ischemic changes elsewhere throughout the brain as outlined above.   CUS  Right Carotid: Velocities in the right ICA are consistent with a 1-39% stenosis. Left Carotid: Velocities in the left ICA are consistent with a 1-39% stenosis. Vertebrals: Both vertebral arteries were patent with antegrade flow. Subclavians: Normal flow hemodynamics were seen in bilateral subclavian       arteries.  TTE - Left ventricle: The cavity size was normal. Wall thickness was   increased in a pattern of mild LVH. There was moderate focal   basal hypertrophy of the septum. Systolic function was normal.   The estimated ejection fraction was in the range of 60% to 65%.   Doppler parameters are consistent with abnormal left ventricular   relaxation (grade 1 diastolic dysfunction). The E/e&' ratio is   between 8-15, suggesting indeterminate LV filling  pressure. - Aortic valve: Trileaflet. Sclerosis without stenosis. There was   no regurgitation. - Mitral valve: Mildly thickened leaflets . There was trivial   regurgitation. - Left atrium: Moderately dilated. - Right ventricle: The cavity size was normal. Wall thickness was   normal. Systolic function was normal. Lateral annulus peak S   velocity: 14.3 cm/s. - Tricuspid valve: There was mild regurgitation. - [name] arteries: PA peak pressure: 29 mm Hg (S). - Inferior vena cava: The vessel was normal in size. The   respirophasic diameter changes were in the normal range (>= 50%),   consistent with normal central venous pressure. Impressions: - Compared to a prior study in 2018, there has been no significant   change.  MRA head 1.   There is reduced flow in the M2 segments of the left middle cerebral artery. 2.   Mild stenosis of the distal M1 segment of the right middle cerebral artery.  Lab Review Component     Latest Ref Rng & Units 08/09/2017 08/10/2017  Cholesterol     0 - 200 mg/dL  101  Triglycerides     <150 mg/dL  74  HDL Cholesterol     >40 mg/dL  33 (L)  Total CHOL/HDL Ratio     RATIO  3.1  VLDL     0 - 40 mg/dL  15  LDL (calc)     0 - 99 mg/dL  53  Hemoglobin A1C     4.8 - 5.6 %  5.2  Mean Plasma Glucose     mg/dL  103  Ammonia     9 - 35 umol/L 26   TSH     0.350 - 4.500 uIU/mL  0.903     Assessment and Plan:   Heriberto Stmartin is an 84 year old male with left MCA infarct and left MCA/ACA punctate infarcts on 08/09/2017 secondary to embolic pattern with unknown source.  Vascular risk factors include HLD, HTN, [name] HTN and PAD.  Patient returns today for scheduled follow-up and overall states that he has been doing well without residual deficits but upon exam,  new left hemiparesis noted along with irregular heart rhythm.   - continue ASA 325 mg and lipitor for stroke prevention -We will fax EKG to Dr. Rachel Bo office (has seen patient previously) to review  and possible need of appointment for follow up -Obtain MRI brain to rule out possible infarct to explain left hemiparesis -Continue to monitor BP at facility - Follow up with your primary care physician in facility for stroke risk factor modification. Recommend maintain blood pressure goal <130/80, diabetes with hemoglobin A1c goal below 7.0% and lipids with LDL cholesterol goal below 70 mg/dL.   Follow-up in 3 months or call earlier if needed  Greater than 50% of time during this 25 minute visit was spent on counseling,explanation of diagnosis of history of left MCA infarct with possible new infarct, reviewing risk factor management of HLD and HTN, planning of further management, discussion with patient and family and coordination of care.  Reviewed reason for EKG and need of repeat MRI.  Venancio Poisson, AGNP-BC  [facility] [address] [place], [place] [address]  Phone [phone] Fax [phone] Note: This document was prepared with digital dictation and possible smart phrase technology. Any transcriptional errors that result from this process are unintentional.

## 2018-01-30 NOTE — Patient Instructions (Signed)
Continue aspirin 325 mg daily  and lipitor  for secondary stroke prevention  We will obtain MRI head to look for possible new stroke with new left sided weakness  Possible referral to cardiology due to undetermined rhythm irregularity seen on EKG today  Continue to follow up with PCP regarding cholesterol and blood pressure management   Continue to monitor blood pressure at home  Maintain strict control of hypertension with blood pressure goal below 130/90, diabetes with hemoglobin A1c goal below 6.5% and cholesterol with LDL cholesterol (bad cholesterol) goal below 70 mg/dL. I also advised the patient to eat a healthy diet with plenty of whole grains, cereals, fruits and vegetables, exercise regularly and maintain ideal body weight.  Followup in the future with me in 3 months or call earlier if needed       Thank you for coming to see [place] at [facility]. I hope we have been able to provide you high quality care today.  You may receive a patient satisfaction survey over the next few weeks. We would appreciate your feedback and comments so that we may continue to improve ourselves and the health of our patients.

## 2018-01-30 NOTE — Telephone Encounter (Signed)
[employer] Medicare:: NPR Medicaid auth: NPR ref # [name] M on @ 9:21 AM central time order sent to GI. They will reach out to schedule.

## 2018-01-30 NOTE — Telephone Encounter (Signed)
Rise Paganini called and stated that Dr. Celso Sickle (@ Dr. Andria Meuse office) said pt is already established with Dr. Gwenlyn Found. He hasn't seen him in over a year but they advise that we f/u with Dr. Gwenlyn Found unless the patient is not going to be seeing him anymore. Will have Janett Billow NP advise and then return Beverly's call. She was appreciative.

## 2018-01-30 NOTE — Telephone Encounter (Signed)
EKG and cover sheet with Janett Billow Np notes fax to Dr .Nadyne Coombes at [phone]. Fax confirmed.

## 2018-02-01 NOTE — Progress Notes (Signed)
I agree with the above plan 

## 2018-02-08 ENCOUNTER — Ambulatory Visit
Admission: RE | Admit: 2018-02-08 | Discharge: 2018-02-08 | Disposition: A | Discharge status: Home / Self Care | Payer: Medicare Other | Source: Ambulatory Visit | Attending: Adult Health | Admitting: Adult Health

## 2018-02-08 ENCOUNTER — Other Ambulatory Visit: Payer: Medicaid Other

## 2018-02-08 DIAGNOSIS — R29818 Other symptoms and signs involving the nervous system: Secondary | ICD-10-CM

## 2018-02-11 ENCOUNTER — Telehealth: Payer: Self-pay

## 2018-02-11 NOTE — Telephone Encounter (Signed)
Notes recorded by Marval Regal, RN on 02/11/2018 at 10:24 AM EDT RN call patients HCPOA [place] that pt MRI was negative for acute findings. Cyree HCPOA verbalized understanding. ------

## 2018-02-11 NOTE — Telephone Encounter (Signed)
-----   Message from Venancio Poisson, NP sent at 02/11/2018  7:34 AM EDT ----- Please notify that MRI was negative for acute findings. Thank you.

## 2018-02-19 ENCOUNTER — Ambulatory Visit: Payer: Medicaid Other | Admitting: Cardiovascular Disease

## 2018-02-20 ENCOUNTER — Encounter: Payer: Self-pay | Admitting: Cardiovascular Disease

## 2018-03-03 ENCOUNTER — Inpatient Hospital Stay (HOSPITAL_COMMUNITY)
Admission: EM | Admit: 2018-03-03 | Discharge: 2018-03-13 | DRG: 682 | Disposition: A | Discharge status: Skilled Nursing Facility | Payer: Medicare Other | Attending: Internal Medicine | Admitting: Internal Medicine

## 2018-03-03 ENCOUNTER — Emergency Department (HOSPITAL_COMMUNITY): Payer: Medicare Other

## 2018-03-03 ENCOUNTER — Encounter (HOSPITAL_COMMUNITY): Payer: Self-pay | Admitting: Emergency Medicine

## 2018-03-03 ENCOUNTER — Inpatient Hospital Stay (HOSPITAL_COMMUNITY): Payer: Medicare Other

## 2018-03-03 DIAGNOSIS — I428 Other cardiomyopathies: Secondary | ICD-10-CM | POA: Diagnosis present

## 2018-03-03 DIAGNOSIS — I340 Nonrheumatic mitral (valve) insufficiency: Secondary | ICD-10-CM | POA: Diagnosis present

## 2018-03-03 DIAGNOSIS — Z515 Encounter for palliative care: Secondary | ICD-10-CM | POA: Diagnosis not present

## 2018-03-03 DIAGNOSIS — Z6826 Body mass index (BMI) 26.0-26.9, adult: Secondary | ICD-10-CM

## 2018-03-03 DIAGNOSIS — Z85028 Personal history of other malignant neoplasm of stomach: Secondary | ICD-10-CM

## 2018-03-03 DIAGNOSIS — I110 Hypertensive heart disease with heart failure: Secondary | ICD-10-CM | POA: Diagnosis present

## 2018-03-03 DIAGNOSIS — I480 Paroxysmal atrial fibrillation: Secondary | ICD-10-CM | POA: Diagnosis not present

## 2018-03-03 DIAGNOSIS — D62 Acute posthemorrhagic anemia: Secondary | ICD-10-CM | POA: Diagnosis present

## 2018-03-03 DIAGNOSIS — R1901 Right upper quadrant abdominal swelling, mass and lump: Secondary | ICD-10-CM

## 2018-03-03 DIAGNOSIS — N170 Acute kidney failure with tubular necrosis: Secondary | ICD-10-CM | POA: Diagnosis present

## 2018-03-03 DIAGNOSIS — Z23 Encounter for immunization: Secondary | ICD-10-CM | POA: Diagnosis present

## 2018-03-03 DIAGNOSIS — R180 Malignant ascites: Secondary | ICD-10-CM | POA: Diagnosis present

## 2018-03-03 DIAGNOSIS — I739 Peripheral vascular disease, unspecified: Secondary | ICD-10-CM | POA: Diagnosis present

## 2018-03-03 DIAGNOSIS — W1830XA Fall on same level, unspecified, initial encounter: Secondary | ICD-10-CM | POA: Diagnosis not present

## 2018-03-03 DIAGNOSIS — E43 Unspecified severe protein-calorie malnutrition: Secondary | ICD-10-CM | POA: Diagnosis present

## 2018-03-03 DIAGNOSIS — Z809 Family history of malignant neoplasm, unspecified: Secondary | ICD-10-CM

## 2018-03-03 DIAGNOSIS — N179 Acute kidney failure, unspecified: Secondary | ICD-10-CM | POA: Diagnosis not present

## 2018-03-03 DIAGNOSIS — Z9049 Acquired absence of other specified parts of digestive tract: Secondary | ICD-10-CM | POA: Diagnosis not present

## 2018-03-03 DIAGNOSIS — E861 Hypovolemia: Secondary | ICD-10-CM | POA: Diagnosis present

## 2018-03-03 DIAGNOSIS — C799 Secondary malignant neoplasm of unspecified site: Secondary | ICD-10-CM | POA: Diagnosis not present

## 2018-03-03 DIAGNOSIS — R55 Syncope and collapse: Secondary | ICD-10-CM | POA: Diagnosis not present

## 2018-03-03 DIAGNOSIS — I5041 Acute combined systolic (congestive) and diastolic (congestive) heart failure: Secondary | ICD-10-CM | POA: Diagnosis present

## 2018-03-03 DIAGNOSIS — Z7189 Other specified counseling: Secondary | ICD-10-CM

## 2018-03-03 DIAGNOSIS — F0150 Vascular dementia without behavioral disturbance: Secondary | ICD-10-CM | POA: Diagnosis present

## 2018-03-03 DIAGNOSIS — Z8249 Family history of ischemic heart disease and other diseases of the circulatory system: Secondary | ICD-10-CM

## 2018-03-03 DIAGNOSIS — I959 Hypotension, unspecified: Secondary | ICD-10-CM | POA: Diagnosis not present

## 2018-03-03 DIAGNOSIS — S0101XA Laceration without foreign body of scalp, initial encounter: Secondary | ICD-10-CM | POA: Diagnosis present

## 2018-03-03 DIAGNOSIS — Z87891 Personal history of nicotine dependence: Secondary | ICD-10-CM

## 2018-03-03 DIAGNOSIS — C786 Secondary malignant neoplasm of retroperitoneum and peritoneum: Secondary | ICD-10-CM | POA: Diagnosis not present

## 2018-03-03 DIAGNOSIS — K311 Adult hypertrophic pyloric stenosis: Secondary | ICD-10-CM | POA: Diagnosis present

## 2018-03-03 DIAGNOSIS — Z833 Family history of diabetes mellitus: Secondary | ICD-10-CM | POA: Diagnosis not present

## 2018-03-03 DIAGNOSIS — N400 Enlarged prostate without lower urinary tract symptoms: Secondary | ICD-10-CM | POA: Diagnosis present

## 2018-03-03 DIAGNOSIS — I5021 Acute systolic (congestive) heart failure: Secondary | ICD-10-CM

## 2018-03-03 DIAGNOSIS — C164 Malignant neoplasm of pylorus: Secondary | ICD-10-CM | POA: Diagnosis present

## 2018-03-03 DIAGNOSIS — Z8673 Personal history of transient ischemic attack (TIA), and cerebral infarction without residual deficits: Secondary | ICD-10-CM

## 2018-03-03 DIAGNOSIS — F0390 Unspecified dementia without behavioral disturbance: Secondary | ICD-10-CM | POA: Diagnosis not present

## 2018-03-03 DIAGNOSIS — R188 Other ascites: Secondary | ICD-10-CM | POA: Diagnosis not present

## 2018-03-03 DIAGNOSIS — E440 Moderate protein-calorie malnutrition: Secondary | ICD-10-CM | POA: Diagnosis not present

## 2018-03-03 DIAGNOSIS — C169 Malignant neoplasm of stomach, unspecified: Secondary | ICD-10-CM | POA: Diagnosis not present

## 2018-03-03 DIAGNOSIS — K5900 Constipation, unspecified: Secondary | ICD-10-CM | POA: Diagnosis present

## 2018-03-03 DIAGNOSIS — E785 Hyperlipidemia, unspecified: Secondary | ICD-10-CM | POA: Diagnosis present

## 2018-03-03 DIAGNOSIS — Z6825 Body mass index (BMI) 25.0-25.9, adult: Secondary | ICD-10-CM | POA: Diagnosis not present

## 2018-03-03 DIAGNOSIS — K56699 Other intestinal obstruction unspecified as to partial versus complete obstruction: Secondary | ICD-10-CM | POA: Diagnosis not present

## 2018-03-03 DIAGNOSIS — Z7982 Long term (current) use of aspirin: Secondary | ICD-10-CM | POA: Diagnosis not present

## 2018-03-03 DIAGNOSIS — R627 Adult failure to thrive: Secondary | ICD-10-CM | POA: Diagnosis present

## 2018-03-03 DIAGNOSIS — D649 Anemia, unspecified: Secondary | ICD-10-CM

## 2018-03-03 DIAGNOSIS — D500 Iron deficiency anemia secondary to blood loss (chronic): Secondary | ICD-10-CM | POA: Diagnosis present

## 2018-03-03 DIAGNOSIS — D508 Other iron deficiency anemias: Secondary | ICD-10-CM | POA: Diagnosis not present

## 2018-03-03 DIAGNOSIS — I444 Left anterior fascicular block: Secondary | ICD-10-CM | POA: Diagnosis present

## 2018-03-03 DIAGNOSIS — E46 Unspecified protein-calorie malnutrition: Secondary | ICD-10-CM | POA: Diagnosis present

## 2018-03-03 DIAGNOSIS — E860 Dehydration: Secondary | ICD-10-CM | POA: Diagnosis present

## 2018-03-03 DIAGNOSIS — I10 Essential (primary) hypertension: Secondary | ICD-10-CM | POA: Diagnosis present

## 2018-03-03 DIAGNOSIS — Z66 Do not resuscitate: Secondary | ICD-10-CM | POA: Diagnosis present

## 2018-03-03 DIAGNOSIS — Z87438 Personal history of other diseases of male genital organs: Secondary | ICD-10-CM | POA: Diagnosis present

## 2018-03-03 HISTORY — DX: Syncope and collapse: R55

## 2018-03-03 LAB — TSH: TSH: 2.318 u[IU]/mL (ref 0.350–4.500)

## 2018-03-03 LAB — URINALYSIS, ROUTINE W REFLEX MICROSCOPIC
Bilirubin Urine: NEGATIVE
Glucose, UA: NEGATIVE mg/dL
Hgb urine dipstick: NEGATIVE
Ketones, ur: 5 mg/dL — AB
Leukocytes, UA: NEGATIVE
Nitrite: NEGATIVE
Protein, ur: 30 mg/dL — AB
Specific Gravity, Urine: 1.015 (ref 1.005–1.030)
pH: 5 (ref 5.0–8.0)

## 2018-03-03 LAB — COMPREHENSIVE METABOLIC PANEL
ALT: 9 U/L (ref 0–44)
AST: 19 U/L (ref 15–41)
Albumin: 2.3 g/dL — ABNORMAL LOW (ref 3.5–5.0)
Alkaline Phosphatase: 98 U/L (ref 38–126)
Anion gap: 10 (ref 5–15)
BUN: 30 mg/dL — ABNORMAL HIGH (ref 8–23)
CHLORIDE: 103 mmol/L (ref 98–111)
CO2: 25 mmol/L (ref 22–32)
CREATININE: 2.34 mg/dL — AB (ref 0.61–1.24)
Calcium: 8.3 mg/dL — ABNORMAL LOW (ref 8.9–10.3)
GFR calc non Af Amer: 24 mL/min — ABNORMAL LOW (ref >60)
GFR, EST AFRICAN AMERICAN: 27 mL/min — AB (ref >60)
Glucose, Bld: 97 mg/dL (ref 70–99)
POTASSIUM: 4.9 mmol/L (ref 3.5–5.1)
SODIUM: 138 mmol/L (ref 135–145)
TOTAL PROTEIN: 4.9 g/dL — AB (ref 6.5–8.1)
Total Bilirubin: 1 mg/dL (ref 0.3–1.2)

## 2018-03-03 LAB — MAGNESIUM: Magnesium: 1.8 mg/dL (ref 1.7–2.4)

## 2018-03-03 LAB — CBC WITH DIFFERENTIAL/PLATELET
Abs Immature Granulocytes: 0 10*3/uL (ref 0.0–0.1)
Basophils Absolute: 0 10*3/uL (ref 0.0–0.1)
Basophils Relative: 1 %
Eosinophils Absolute: 0.1 10*3/uL (ref 0.0–0.7)
Eosinophils Relative: 2 %
HEMATOCRIT: 36.2 % — AB (ref 39.0–52.0)
HEMOGLOBIN: 10.7 g/dL — AB (ref 13.0–17.0)
IMMATURE GRANULOCYTES: 0 %
LYMPHS ABS: 1.1 10*3/uL (ref 0.7–4.0)
Lymphocytes Relative: 17 %
MCH: 27.8 pg (ref 26.0–34.0)
MCHC: 29.6 g/dL — ABNORMAL LOW (ref 30.0–36.0)
MCV: 94 fL (ref 78.0–100.0)
Monocytes Absolute: 0.4 10*3/uL (ref 0.1–1.0)
Monocytes Relative: 7 %
NEUTROS PCT: 73 %
Neutro Abs: 4.5 10*3/uL (ref 1.7–7.7)
Platelets: 379 10*3/uL (ref 150–400)
RBC: 3.85 MIL/uL — AB (ref 4.22–5.81)
RDW: 15 % (ref 11.5–15.5)
WBC: 6.2 10*3/uL (ref 4.0–10.5)

## 2018-03-03 LAB — TROPONIN I
Troponin I: <0.03 ng/mL (ref <0.03)
Troponin I: <0.03 ng/mL (ref <0.03)

## 2018-03-03 LAB — LIPASE, BLOOD: LIPASE: 31 U/L (ref 11–51)

## 2018-03-03 LAB — I-STAT TROPONIN, ED: Troponin i, poc: 0 ng/mL (ref 0.00–0.08)

## 2018-03-03 LAB — IRON AND TIBC
Iron: 20 ug/dL — ABNORMAL LOW (ref 45–182)
Saturation Ratios: 9 % — ABNORMAL LOW (ref 17.9–39.5)
TIBC: 218 ug/dL — ABNORMAL LOW (ref 250–450)
UIBC: 198 ug/dL

## 2018-03-03 LAB — CK: Total CK: 47 U/L — ABNORMAL LOW (ref 49–397)

## 2018-03-03 LAB — MRSA PCR SCREENING: MRSA by PCR: NEGATIVE

## 2018-03-03 MED ORDER — ACETAMINOPHEN 325 MG PO TABS: 650.0000 mg | ORAL_TABLET | Freq: Four times a day (QID) | ORAL | Status: DC | PRN

## 2018-03-03 MED ORDER — SERTRALINE HCL 50 MG PO TABS
50.0000 mg | ORAL_TABLET | Freq: Every day | ORAL | Status: DC
  Administered 2018-03-03 – 2018-03-13 (×11): 50 mg via ORAL
  Filled 2018-03-03 (×11): qty 1

## 2018-03-03 MED ORDER — DEXTROSE-NACL 5-0.9 % IV SOLN
INTRAVENOUS | Status: DC
  Administered 2018-03-03 – 2018-03-05 (×3): via INTRAVENOUS

## 2018-03-03 MED ORDER — SENNOSIDES-DOCUSATE SODIUM 8.6-50 MG PO TABS
2.0000 | ORAL_TABLET | Freq: Two times a day (BID) | ORAL | Status: DC
  Administered 2018-03-03 – 2018-03-10 (×9): 2 via ORAL
  Filled 2018-03-03 (×9): qty 2

## 2018-03-03 MED ORDER — LIDOCAINE-EPINEPHRINE (PF) 2 %-1:200000 IJ SOLN
20.0000 mL | Freq: Once | INTRAMUSCULAR | Status: AC
  Administered 2018-03-03: 20 mL via INTRADERMAL
  Filled 2018-03-03: qty 20

## 2018-03-03 MED ORDER — SODIUM CHLORIDE 0.9 % IV BOLUS: 1000.0000 mL | Freq: Once | INTRAVENOUS | Status: DC

## 2018-03-03 MED ORDER — ONDANSETRON HCL 4 MG PO TABS: 4.0000 mg | ORAL_TABLET | Freq: Four times a day (QID) | ORAL | Status: DC | PRN

## 2018-03-03 MED ORDER — HEPARIN SODIUM (PORCINE) 5000 UNIT/ML IJ SOLN
5000.0000 [IU] | Freq: Three times a day (TID) | INTRAMUSCULAR | Status: DC
  Administered 2018-03-03 – 2018-03-05 (×6): 5000 [IU] via SUBCUTANEOUS
  Filled 2018-03-03 (×6): qty 1

## 2018-03-03 MED ORDER — ASPIRIN EC 325 MG PO TBEC
325.0000 mg | DELAYED_RELEASE_TABLET | Freq: Every day | ORAL | Status: DC
  Administered 2018-03-03: 325 mg via ORAL
  Filled 2018-03-03: qty 1

## 2018-03-03 MED ORDER — SODIUM CHLORIDE 0.9% FLUSH
3.0000 mL | Freq: Two times a day (BID) | INTRAVENOUS | Status: DC
  Administered 2018-03-03 – 2018-03-12 (×11): 3 mL via INTRAVENOUS

## 2018-03-03 MED ORDER — MIRTAZAPINE 15 MG PO TABS
7.5000 mg | ORAL_TABLET | Freq: Every day | ORAL | Status: DC
  Administered 2018-03-03 – 2018-03-12 (×9): 7.5 mg via ORAL
  Filled 2018-03-03 (×9): qty 1

## 2018-03-03 MED ORDER — POLYETHYLENE GLYCOL 3350 17 G PO PACK: 17.0000 g | PACK | Freq: Every day | ORAL | Status: DC

## 2018-03-03 MED ORDER — ONDANSETRON HCL 4 MG/2ML IJ SOLN
4.0000 mg | Freq: Once | INTRAMUSCULAR | Status: AC
  Administered 2018-03-03: 4 mg via INTRAVENOUS
  Filled 2018-03-03: qty 2

## 2018-03-03 MED ORDER — SENNOSIDES-DOCUSATE SODIUM 8.6-50 MG PO TABS
1.0000 | ORAL_TABLET | Freq: Every evening | ORAL | Status: DC | PRN
  Administered 2018-03-11: 1 via ORAL
  Filled 2018-03-03: qty 1

## 2018-03-03 MED ORDER — SODIUM CHLORIDE 0.9 % IV BOLUS
500.0000 mL | Freq: Once | INTRAVENOUS | Status: AC
  Administered 2018-03-03: 500 mL via INTRAVENOUS

## 2018-03-03 MED ORDER — ONDANSETRON HCL 4 MG/2ML IJ SOLN
4.0000 mg | Freq: Four times a day (QID) | INTRAMUSCULAR | Status: DC | PRN
  Administered 2018-03-04 – 2018-03-09 (×2): 4 mg via INTRAVENOUS
  Filled 2018-03-03 (×3): qty 2

## 2018-03-03 MED ORDER — POLYETHYLENE GLYCOL 3350 17 G PO PACK
17.0000 g | PACK | Freq: Every day | ORAL | Status: DC
  Administered 2018-03-03 – 2018-03-13 (×4): 17 g via ORAL
  Filled 2018-03-03 (×7): qty 1

## 2018-03-03 MED ORDER — VITAMIN D 1000 UNITS PO TABS
1000.0000 [IU] | ORAL_TABLET | Freq: Every day | ORAL | Status: DC
  Administered 2018-03-03 – 2018-03-10 (×8): 1000 [IU] via ORAL
  Filled 2018-03-03 (×14): qty 1

## 2018-03-03 MED ORDER — ATORVASTATIN CALCIUM 20 MG PO TABS
20.0000 mg | ORAL_TABLET | Freq: Every day | ORAL | Status: DC
  Administered 2018-03-03 – 2018-03-09 (×6): 20 mg via ORAL
  Filled 2018-03-03 (×4): qty 1
  Filled 2018-03-03: qty 2
  Filled 2018-03-03: qty 1

## 2018-03-03 NOTE — ED Notes (Signed)
Pt stated "I don't have any sensation to pee and it hard to with you standing in here. " I told pt I have to stay in here because I dont want to you to fall. Pt stated "if I can stand her for awhile I might be able to go. "

## 2018-03-03 NOTE — ED Notes (Signed)
Patient transported to CT 

## 2018-03-03 NOTE — H&P (Signed)
History and Physical    [name] MWN:[id number] DOB: 10-04-1929 DOA: 03/03/2018  PCP: Patient, [name] Consultants:  none Patient coming from: [place]  Chief Complaint: Fall  HPI: [name] is a 84 y.o. male with medical history significant for HTN, PAD, CVA, dementia, HLD, BPH, who was brought to the ED via EMS this morning after being found down at his facility.  Reportedly systolic blood pressure was in the 80s when he was found.  Patient states that when he woke up this morning he went to use the bathroom and experienced a lot of abdominal pain and straining while he was trying to have a bowel movement.  That is the last memory he has before waking up in the ambulance.  Patient states that constipation has been an issue at least for the past 3 weeks and is associated with anorexia, stabbing/shooting right-sided abdominal pain, and occasional vomiting.  He denies fever or chills.  He denies lightheadedness, headache, double vision, chest pain, palpitations.  He does state that he has had a nonproductive cough that is severe at times but he cannot tell me how long this is been going on.  He reports that he has eaten very little in the past 3 weeks.  He states that he just does not have an appetite for any of the food that is brought to him.  He does say that he has been maintaining at least some fluid intake.  He reports that he has lost weight but cannot tell me how much weight and over what time period.  ED Course:  He is alert and oriented and has stable vital signs, afebrile.  EKG showed an irregular rhythm with present P waves but dropped QRS complexes, left anterior fascicular block, poor R wave progression, left ventricular hypertrophy.  Initial troponin was negative.  He had 450 cc on his bladder scan and has been unable to urinate since arrival.  Noted to be in acute kidney injury, given 500 cc normal saline bolus.  Head CT negative for acute.   Review of Systems: As  per HPI; otherwise review of systems reviewed and negative.   Ambulatory Status:  Ambulates without assistance  Past Medical History:  Diagnosis Date  . BPH (benign prostatic hyperplasia)   . Cataract   . Essential hypertension 05/08/2013  . Exertional dyspnea    with exertion  . Hypertension   . Mitral valvular regurgitation June 2014   Mild to moderate MR on echocardiogram  . PAD (peripheral artery disease) ([place])  December 2014   Lower extremity Dopplers/ABIs: RABI 0.29, LABI 0.66; bilateral external iliacs with significant diameter reduction; R. SFA occlusive disease throughout into popliteal artery with 0 vessel runoff. Anterior tibial reconstitutes distally. 70-99% reduction in all SFA. One-vessel runoff (anterior tibial)  . Stroke [facility])     Past Surgical History:  Procedure Laterality Date  . ABDOMINAL ANGIOGRAM  07/07/2013   Procedure: ABDOMINAL ANGIOGRAM;  Surgeon: Lorretta Harp, MD;  Location: [facility] CATH LAB;  Service: Cardiovascular;;  . CHOLECYSTECTOMY  07/04/2010  . ERCP N/A 11/27/2012   Procedure: ENDOSCOPIC RETROGRADE CHOLANGIOPANCREATOGRAPHY (ERCP);  Surgeon: Inda Castle, MD;  Location: [facility];  Service: Endoscopy;  Laterality: N/A;  . EYE SURGERY Right   . LEFT HEART CATHETERIZATION WITH CORONARY ANGIOGRAM N/A 07/07/2013   Procedure: LEFT HEART CATHETERIZATION WITH CORONARY ANGIOGRAM;  Surgeon: Lorretta Harp, MD;  Location: [facility] CATH LAB;  Service: Cardiovascular;  Laterality: N/A;  . LOWER EXTREMITY ANGIOGRAM  N/A 07/07/2013   Procedure: LOWER EXTREMITY ANGIOGRAM;  Surgeon: Lorretta Harp, MD;  Location: [facility] CATH LAB;  Service: Cardiovascular;  Laterality: N/A;  . NM MYOVIEW LTD  December 2014   Apical thinning but no ischemia. EF 55%  . PROSTATE SURGERY     Dr. Lowella Bandy  . TRANSTHORACIC ECHOCARDIOGRAM  June 2014   Basal septal thickening with moderate LVH. EF 65%. Normal wall motion. Diastolic dysfunction/NOS. Aortic sclerosis without stenosis. Mild to  moderate MR    Social History   Socioeconomic History  . Marital status: Single    Spouse name: Not on file  . Number of children: Not on file  . Years of education: Not on file  . Highest education level: Not on file  Occupational History  . Occupation: Retired    Fish farm manager: RETIRED  Social Needs  . Financial resource strain: Not hard at all  . Food insecurity:    Worry: Never true    Inability: Never true  . Transportation needs:    Medical: No    Non-medical: No  Tobacco Use  . Smoking status: Former Smoker    Types: Cigarettes    Last attempt to quit: 11/25/1980    Years since quitting: 37.2  . Smokeless tobacco: Never Used  Substance and Sexual Activity  . Alcohol use: Not Currently    Alcohol/week: 4.0 standard drinks    Types: 4 Standard drinks or equivalent per week    Comment: beers  . Drug use: No  . Sexual activity: Never  Lifestyle  . Physical activity:    Days per week: 0 days    Minutes per session: 0 min  . Stress: Not at all  Relationships  . Social connections:    Talks on phone: Once a week    Gets together: Once a week    Attends religious service: Never    Active member of club or organization: No    Attends meetings of clubs or organizations: Never    Relationship status: Never married  . Intimate partner violence:    Fear of current or ex partner: No    Emotionally abused: No    Physically abused: No    Forced sexual activity: No  Other Topics Concern  . Not on file  Social History Narrative   He is originally from [place]. He has a history of long-standing tobacco use but quit several years ago. He also has a history of former alcohol abuse as well.   He is retired and currently single.    No Known Allergies  Family History  Problem Relation Age of Onset  . Diabetes Sister   . Cancer Sister   . Cancer Mother   . Cancer Father   . Cancer Sister     Prior to Admission medications   Medication Sig Start Date End Date Taking?  Authorizing Provider  acetaminophen (TYLENOL) 325 MG tablet Take 2 tablets (650 mg total) by mouth every 6 (six) hours as needed for mild pain (or Fever >/= 101). 08/14/17  Yes Oretha Milch D, MD  aspirin EC 325 MG tablet Take 1 tablet (325 mg total) by mouth daily. 10/30/17  Yes Rosalin Hawking, MD  atorvastatin (LIPITOR) 20 MG tablet Take 20 mg by mouth at bedtime. 09/13/17  Yes [provider]  Cholecalciferol 1000 units tablet Take 1,000 Units by mouth daily. 12/11/17  Yes [provider]  lisinopril (PRINIVIL,ZESTRIL) 40 MG tablet Take 1 tablet (40 mg total) by mouth daily. 08/14/17  Yes Oretha Milch D, MD  mirtazapine (REMERON) 15 MG tablet Take 7.5 mg by mouth at bedtime.   Yes [provider]  polyethylene glycol (MIRALAX / GLYCOLAX) packet Take 17 g by mouth daily.   Yes [provider]  sertraline (ZOLOFT) 50 MG tablet Take 1 tablet (50 mg total) by mouth daily. 10/30/17  Yes Rosalin Hawking, MD    Physical Exam: Vitals:   03/03/18 1045 03/03/18 1100 03/03/18 1115 03/03/18 1130  BP: 122/67 114/71 (!) 109/98 104/69  Pulse:  98    Resp:  20    Temp:      TempSrc:      SpO2:  100%       . General: Elderly male, appears calm and comfortable and is in NAD . Eyes:  PERRL, EOMI, normal lids, iris . ENT:  grossly normal hearing, lips & tongue, mmm; poor dentition.  Edentulous on the top . Neck:  no LAD, masses or thyromegaly; no carotid bruits . Cardiovascular: Tachycardic, irregular, 2 out of 6 systolic murmur at left sternal border . Respiratory:   CTA bilaterally with no wheezes/rales/rhonchi.  Normal respiratory effort. . Abdomen: Moderately distended, nontender, very hypoactive bowel sounds.  There is a nonpulsatile approximately 4 to 5 cm mass in the right upper quadrant that is nontender. . Back:   grossly normal alignment, no CVAT . Skin:  no rash or induration seen on limited exam.  Chronic venous stasis changes in the ankles; R parietal laceration  with staples . Musculoskeletal:  grossly normal tone BUE/BLE, good ROM, no bony abnormality or obvious joint deformity . Lower extremity:  No LE edema.  Limited foot exam with no ulcerations.  2+ distal pulses. Marland Kitchen Psychiatric:  grossly normal mood and affect, speech fluent and appropriate although he has occasional word finding difficulty, AOx3 . Neurologic:  CN 2-12 grossly intact, moves all extremities in coordinated fashion, sensation intact    Radiological Exams on Admission: Dg Chest 2 View  Result Date: 03/03/2018 CLINICAL DATA:  Unwitnessed fall.  Hypertension. EXAM: CHEST - 2 VIEW COMPARISON:  August 09, 2017 FINDINGS: There is atelectatic change in the right mid lower lung zones. There is no edema or consolidation. Heart is borderline enlarged with pulmonary vascularity normal. Aorta is prominent and rather tortuous, stable. There are metallic fragments in the left hemithorax. No pneumothorax. No bone lesions. IMPRESSION: No edema or consolidation. Areas of mild atelectatic change on the right. Stable cardiac silhouette. Aortic prominence and tortuosity likely reflect chronic hypertensive change. Electronically Signed   By: Lowella Grip III M.D.   On: 03/03/2018 11:44   Ct Head Wo Contrast  Result Date: 03/03/2018 CLINICAL DATA:  Unwitnessed fall EXAM: CT HEAD WITHOUT CONTRAST TECHNIQUE: Contiguous axial images were obtained from the base of the skull through the vertex without intravenous contrast. COMPARISON:  Head CT August 09, 2017 and brain MRI February 08, 2018 FINDINGS: Brain: There is moderate diffuse atrophy. There is no intracranial mass, hemorrhage, extra-axial fluid collection, or midline shift. There is evidence of a prior infarct involving much of the left occipital lobe, stable. There is evidence of a prior infarct in the mid right occipital lobe, stable. There is evidence of a prior infarct at the gray-white junction of the anterior right parietal lobe, stable. Elsewhere there  is patchy small vessel disease in the centra semiovale bilaterally. There is no acute appearing infarct. Basal ganglia calcification bilaterally is a physiologic finding. A small calcification in the mid right cerebellum may represent a  small granuloma. This finding was present on prior study. Vascular: There is no hyperdense vessel. There is calcification in the left vertebral artery as well as in both carotid siphon regions. Skull: Bony calvarium appears intact. There is a right parietal scalp hematoma. Sinuses/Orbits: There is opacification in a posterior ethmoid air cell. There is mucosal thickening in several ethmoid air cells bilaterally. There is inward bowing of each medial orbital wall, a finding that may be congenital or may be due to previous trauma. This finding is stable. No intraorbital lesions are evident. Other: Mastoid air cells are clear. IMPRESSION: 1. Right parietal scalp hematoma with underlying bony calvarium intact. 2. Atrophy with supratentorial small vessel disease. Prior infarcts as noted, largest in the occipital lobes. 3.  No mass or hemorrhage.  No extra-axial fluid collection. 4.  Foci of arterial vascular calcification. 5.  Ethmoid sinus disease noted. Electronically Signed   By: Lowella Grip III M.D.   On: 03/03/2018 11:21    EKG: Independently reviewed. Rate 105 p waves with dropped qrs, irregular Left anterior fascicular block Abnormal R-wave progression, late transition Probable left ventricular hypertrophy  Labs on Admission: I have personally reviewed the available labs and imaging studies at the time of the admission.  Pertinent labs:  Sodium 138 potassium 4.9 chloride 103 CO2 25 glucose 97 BUN 30 creatinine 2.34 (last value was August 16, 2017 and was 0.6) glucose 97 calcium 8.3 magnesium 1.8 albumin 2.3 lipase 31 AST 19 ALT 9 total protein 4.9 total bilirubin 1.0  Troponin 0 0.00 CK 47  WBC 6.2 hemoglobin 10.7 platelets 379   Assessment/Plan Principal  Problem:   Syncope and collapse Active Problems:   Valvular heart disease   History of BPH   Right upper quadrant abdominal mass   Essential hypertension   PAD (peripheral artery disease) ([facility])   Dyslipidemia   Protein-calorie malnutrition ([facility])   Vascular dementia without behavioral disturbance   Constipation   AKI (acute kidney injury) ([place])   Anemia    Syncope/Collapse -etiology thought to be either new onset arrhythmia versus related to AKI/hypovolemia -Initial troponin negative and no evidence of ischemia on initial EKG -Admit to inpatient, continuous telemetry -Cycle troponins -EKG in a.m.  -Check TSH -Continue IV fluids.  Will repeat 500 cc normal saline bolus and then start maintenance fluids with D5 normal saline at 100 cc an hour -obtain TTE. Pt has h/o mild-moderate MR -bedrest; fall precautions  Nonpulsatile RUQ mass -appears to be an abdominal wall hernia on exam but cannot rule out rectus sheath hematoma or other mass. Pt does have a h/o 4 cm hemangioma on CT 2 years ago.  -CT abd without contrast ordered  Constipation -Potentially related to mass mentioned above especially if this is a hernia -Start senna/Colace, daily MiraLAX.  May need enema and/or augmented bowel regimen -Follow-up CT abdomen  AKI -Most likely related to hypovolemia from decreased p.o. intake x3 weeks -Repeat normal saline bolus 500 cc and then maintenance fluids with D5 normal saline at 100 cc/h -Hold ACE inhibitor -Daily BMP -Awaiting urine sample for UA -Ensure patient can void; if not will need I and O cath  History of CVA -Continue home Lipitor -Continue aspirin  Hypertension -Hold home ACE inhibitor -Monitor BP for now; hydralazine as needed  Anemia: This appears to be a new problem.  Patient denies melena/hematochezia. -Check iron and TIBC -Hemoccult stool -Repeat CBC tomorrow    DVT prophylaxis: Heparin [place] Code Status: DNR  Family Communication: none  Disposition  Plan:  SNF once clinically improved Consults called: none  Admission status: Admit - It is my clinical opinion that admission to INPATIENT is reasonable and necessary because of the expectation that this patient will require hospital care that crosses at least 2 midnights to treat this condition based on the medical complexity of the problems presented.  Given the aforementioned information, the predictability of an adverse outcome is felt to be significant.     Janora Norlander MD [facility]  If note is complete, please contact covering daytime or nighttime physician. [email or web address] Password [facility]  03/03/2018, 12:43 PM

## 2018-03-03 NOTE — ED Triage Notes (Signed)
Pt arrives from [place] via [facility], ems reports pt was found on the floor, unsure of last time he was seen. bp in the 80's and pt found to be in afib with no hx. Pt has lac and hematoma to posterior scalp. Not on blood thinners. Pt arrived with ccollar in place, able to move all extremities, no c/o neck pain. Some hx of memory impairment per staff at facility. Pt alert and oriented to person only.

## 2018-03-03 NOTE — ED Notes (Signed)
Patient transported to X-ray 

## 2018-03-03 NOTE — Progress Notes (Signed)
Patient voided 300 ml, I and O cath is not done. Urine sent for urinalysis.  Hennessey Cantrell, RN

## 2018-03-03 NOTE — ED Provider Notes (Signed)
[facility] EMERGENCY DEPARTMENT Provider Note   CSN: [id number] Arrival date & time: 03/03/18  5188     History   Chief Complaint Chief Complaint  Patient presents with  . Fall    HPI [name] is a 84 y.o. male.  84 yo M with a cc of a fall.  Patient nauseated this morning, woke up in the back of an ambulance.  Still has some nausea now, denies cp, sob, abdominal pain, fevers.   Per EMS the patient was found on the ground this morning.  Level 5 caveat dementia  The history is provided by the patient.  Fall  This is a new problem. The current episode started yesterday. The problem occurs constantly. The problem has not changed since onset.Pertinent negatives include no chest pain, no abdominal pain, no headaches and no shortness of breath. Nothing aggravates the symptoms. Nothing relieves the symptoms. He has tried nothing for the symptoms. The treatment provided no relief.    Past Medical History:  Diagnosis Date  . BPH (benign prostatic hyperplasia)   . Cataract   . Essential hypertension 05/08/2013  . Exertional dyspnea    with exertion  . Hypertension   . Mitral valvular regurgitation June 2014   Mild to moderate MR on echocardiogram  . PAD (peripheral artery disease) ([place])  December 2014   Lower extremity Dopplers/ABIs: RABI 0.29, LABI 0.66; bilateral external iliacs with significant diameter reduction; R. SFA occlusive disease throughout into popliteal artery with 0 vessel runoff. Anterior tibial reconstitutes distally. 70-99% reduction in all SFA. One-vessel runoff (anterior tibial)  . Stroke [facility])     Patient Active Problem List   Diagnosis Date Noted  . Constipation 03/03/2018  . AKI (acute kidney injury) ([place]) 03/03/2018  . Anemia 03/03/2018  . Chronic ischemic left MCA stroke 11/27/2017  . Aphasia due to recent stroke 11/11/2017  . Vascular dementia without behavioral disturbance 08/16/2017  . Dyslipidemia 08/15/2017  . Physical  deconditioning 08/15/2017  . Protein-calorie malnutrition ([place]) 08/15/2017  . Acute ischemic left MCA stroke ([place]) 08/13/2017  . Syncope and collapse 08/12/2017  . Right inguinal hernia 08/10/2017  . Syncope 08/09/2017  . Abnormal finding on urinalysis 08/09/2017  . Near syncope 11/22/2016  . Essential hypertension 05/08/2013  . Claudication of calf muscles ([place]) 05/06/2013  . PAD (peripheral artery disease) ([place]) 05/05/2013  . Right upper quadrant abdominal mass 11/18/2012  . History of BPH 10/20/2012  . Valvular heart disease 10/17/2012    Past Surgical History:  Procedure Laterality Date  . ABDOMINAL ANGIOGRAM  07/07/2013   Procedure: ABDOMINAL ANGIOGRAM;  Surgeon: Lorretta Harp, MD;  Location: [facility] CATH LAB;  Service: Cardiovascular;;  . CHOLECYSTECTOMY  07/04/2010  . ERCP N/A 11/27/2012   Procedure: ENDOSCOPIC RETROGRADE CHOLANGIOPANCREATOGRAPHY (ERCP);  Surgeon: Inda Castle, MD;  Location: [facility] ENDOSCOPY;  Service: Endoscopy;  Laterality: N/A;  . EYE SURGERY Right   . LEFT HEART CATHETERIZATION WITH CORONARY ANGIOGRAM N/A 07/07/2013   Procedure: LEFT HEART CATHETERIZATION WITH CORONARY ANGIOGRAM;  Surgeon: Lorretta Harp, MD;  Location: [facility] CATH LAB;  Service: Cardiovascular;  Laterality: N/A;  . LOWER EXTREMITY ANGIOGRAM N/A 07/07/2013   Procedure: LOWER EXTREMITY ANGIOGRAM;  Surgeon: Lorretta Harp, MD;  Location: [facility] CATH LAB;  Service: Cardiovascular;  Laterality: N/A;  . NM MYOVIEW LTD  December 2014   Apical thinning but no ischemia. EF 55%  . PROSTATE SURGERY     Dr. Lowella Bandy  . TRANSTHORACIC ECHOCARDIOGRAM  June 2014  Basal septal thickening with moderate LVH. EF 65%. Normal wall motion. Diastolic dysfunction/NOS. Aortic sclerosis without stenosis. Mild to moderate MR        Home Medications    Prior to Admission medications   Medication Sig Start Date End Date Taking? Authorizing Provider  acetaminophen (TYLENOL) 325 MG tablet Take 2 tablets (650 mg total) by  mouth every 6 (six) hours as needed for mild pain (or Fever >/= 101). 08/14/17  Yes Oretha Milch D, MD  aspirin EC 325 MG tablet Take 1 tablet (325 mg total) by mouth daily. 10/30/17  Yes Rosalin Hawking, MD  atorvastatin (LIPITOR) 20 MG tablet Take 20 mg by mouth at bedtime. 09/13/17  Yes [provider]  Cholecalciferol 1000 units tablet Take 1,000 Units by mouth daily. 12/11/17  Yes [provider]  lisinopril (PRINIVIL,ZESTRIL) 40 MG tablet Take 1 tablet (40 mg total) by mouth daily. 08/14/17  Yes Oretha Milch D, MD  mirtazapine (REMERON) 15 MG tablet Take 7.5 mg by mouth at bedtime.   Yes [provider]  polyethylene glycol (MIRALAX / GLYCOLAX) packet Take 17 g by mouth daily.   Yes [provider]  sertraline (ZOLOFT) 50 MG tablet Take 1 tablet (50 mg total) by mouth daily. 10/30/17  Yes Rosalin Hawking, MD    Family History Family History  Problem Relation Age of Onset  . Diabetes Sister   . Cancer Sister   . Cancer Mother   . Cancer Father   . Cancer Sister     Social History Social History   Tobacco Use  . Smoking status: Former Smoker    Types: Cigarettes    Last attempt to quit: 11/25/1980    Years since quitting: 37.2  . Smokeless tobacco: Never Used  Substance Use Topics  . Alcohol use: Not Currently    Alcohol/week: 4.0 standard drinks    Types: 4 Standard drinks or equivalent per week    Comment: beers  . Drug use: No     Allergies   Patient has no known allergies.   Review of Systems Review of Systems  Unable to perform ROS: Dementia  Constitutional: Negative for chills and fever.  HENT: Negative for congestion and facial swelling.   Eyes: Negative for discharge and visual disturbance.  Respiratory: Negative for shortness of breath.   Cardiovascular: Negative for chest pain and palpitations.  Gastrointestinal: Negative for abdominal pain, diarrhea and vomiting.  Musculoskeletal: Negative for arthralgias and myalgias.  Skin:  Negative for color change and rash.  Neurological: Negative for tremors, syncope and headaches.  Psychiatric/Behavioral: Negative for confusion and dysphoric mood.     Physical Exam Updated Vital Signs BP 112/76   Pulse (!) 103   Temp 98.3 F (36.8 C) (Oral)   Resp 18   SpO2 98%   Physical Exam  Constitutional: He is oriented to person, place, and time. He appears well-developed and well-nourished.  HENT:  Head: Normocephalic.  Patient is able to rotate his head 45 degrees in either direction without midline spinal tenderness.  Eyes: Pupils are equal, round, and reactive to light. EOM are normal.  Neck: Normal range of motion. Neck supple. No JVD present.  Cardiovascular: Normal rate and regular rhythm. Exam reveals no gallop and no friction rub.  No murmur heard. Pulmonary/Chest: No respiratory distress. He has no wheezes.  Abdominal: He exhibits no distension and no mass. There is no tenderness. There is no rebound and no guarding.  Musculoskeletal: Normal range of motion.  Neurological: He  is alert and oriented to person, place, and time.  Skin: No rash noted. No pallor.  Right parietal lac, 4.5cm in length, hematoma associated  Psychiatric: He has a normal mood and affect. His behavior is normal.  Nursing note and vitals reviewed.    ED Treatments / Results  Labs (all labs ordered are listed, but only abnormal results are displayed) Labs Reviewed  CBC WITH DIFFERENTIAL/PLATELET - Abnormal; Notable for the following components:      Result Value   RBC 3.85 (*)    Hemoglobin 10.7 (*)    HCT 36.2 (*)    MCHC 29.6 (*)    All other components within normal limits  COMPREHENSIVE METABOLIC PANEL - Abnormal; Notable for the following components:   BUN 30 (*)    Creatinine, Ser 2.34 (*)    Calcium 8.3 (*)    Total Protein 4.9 (*)    Albumin 2.3 (*)    GFR calc non Af Amer 24 (*)    GFR calc Af Amer 27 (*)    All other components within normal limits  CK - Abnormal;  Notable for the following components:   Total CK 47 (*)    All other components within normal limits  IRON AND TIBC - Abnormal; Notable for the following components:   Iron 20 (*)    TIBC 218 (*)    Saturation Ratios 9 (*)    All other components within normal limits  MRSA PCR SCREENING  LIPASE, BLOOD  MAGNESIUM  TROPONIN I  TSH  URINALYSIS, ROUTINE W REFLEX MICROSCOPIC  TROPONIN I  OCCULT BLOOD X 1 CARD TO LAB, STOOL  URINALYSIS, ROUTINE W REFLEX MICROSCOPIC  I-STAT TROPONIN, ED    EKG EKG Interpretation  Date/Time:  Sunday March 03 2018 09:19:17 EDT Ventricular Rate:  105 PR Interval:    QRS Duration: 109 QT Interval:  356 QTC Calculation: 471 R Axis:   -62 Text Interpretation:  p waves with dropped qrs, irregular  Left anterior fascicular block Abnormal R-wave progression, late transition Probable left ventricular hypertrophy Otherwise no significant change Confirmed by Deno Etienne [id number]) on 03/03/2018 9:35:00 AM   Radiology Dg Chest 2 View  Result Date: 03/03/2018 CLINICAL DATA:  Unwitnessed fall.  Hypertension. EXAM: CHEST - 2 VIEW COMPARISON:  August 09, 2017 FINDINGS: There is atelectatic change in the right mid lower lung zones. There is no edema or consolidation. Heart is borderline enlarged with pulmonary vascularity normal. Aorta is prominent and rather tortuous, stable. There are metallic fragments in the left hemithorax. No pneumothorax. No bone lesions. IMPRESSION: No edema or consolidation. Areas of mild atelectatic change on the right. Stable cardiac silhouette. Aortic prominence and tortuosity likely reflect chronic hypertensive change. Electronically Signed   By: Lowella Grip III M.D.   On: 03/03/2018 11:44   Ct Head Wo Contrast  Result Date: 03/03/2018 CLINICAL DATA:  Unwitnessed fall EXAM: CT HEAD WITHOUT CONTRAST TECHNIQUE: Contiguous axial images were obtained from the base of the skull through the vertex without intravenous contrast. COMPARISON:   Head CT August 09, 2017 and brain MRI February 08, 2018 FINDINGS: Brain: There is moderate diffuse atrophy. There is no intracranial mass, hemorrhage, extra-axial fluid collection, or midline shift. There is evidence of a prior infarct involving much of the left occipital lobe, stable. There is evidence of a prior infarct in the mid right occipital lobe, stable. There is evidence of a prior infarct at the gray-white junction of the anterior right parietal lobe, stable. Elsewhere there  is patchy small vessel disease in the centra semiovale bilaterally. There is no acute appearing infarct. Basal ganglia calcification bilaterally is a physiologic finding. A small calcification in the mid right cerebellum may represent a small granuloma. This finding was present on prior study. Vascular: There is no hyperdense vessel. There is calcification in the left vertebral artery as well as in both carotid siphon regions. Skull: Bony calvarium appears intact. There is a right parietal scalp hematoma. Sinuses/Orbits: There is opacification in a posterior ethmoid air cell. There is mucosal thickening in several ethmoid air cells bilaterally. There is inward bowing of each medial orbital wall, a finding that may be congenital or may be due to previous trauma. This finding is stable. No intraorbital lesions are evident. Other: Mastoid air cells are clear. IMPRESSION: 1. Right parietal scalp hematoma with underlying bony calvarium intact. 2. Atrophy with supratentorial small vessel disease. Prior infarcts as noted, largest in the occipital lobes. 3.  No mass or hemorrhage.  No extra-axial fluid collection. 4.  Foci of arterial vascular calcification. 5.  Ethmoid sinus disease noted. Electronically Signed   By: Lowella Grip III M.D.   On: 03/03/2018 11:21    Procedures .Marland KitchenLaceration Repair Date/Time: 03/03/2018 12:34 PM Performed by: Deno Etienne, DO Authorized by: Deno Etienne, DO   Consent:    Consent obtained:  Verbal    Consent given by:  Patient   Risks discussed:  Infection, pain, poor cosmetic result and poor wound healing   Alternatives discussed:  No treatment, delayed treatment and observation Anesthesia (see MAR for exact dosages):    Anesthesia method:  Local infiltration   Local anesthetic:  Lidocaine 2% WITH epi Laceration details:    Location:  Scalp   Scalp location:  R parietal   Length (cm):  4.5 Repair type:    Repair type:  Simple Pre-procedure details:    Preparation:  Patient was prepped and draped in usual sterile fashion Exploration:    Hemostasis achieved with:  Epinephrine and direct pressure   Wound exploration: entire depth of wound probed and visualized     Contaminated: no   Treatment:    Area cleansed with:  Saline   Amount of cleaning:  Standard   Irrigation solution:  Sterile saline   Irrigation volume:  20   Irrigation method:  Syringe   Visualized foreign bodies/material removed: no   Skin repair:    Repair method:  Staples   Number of staples:  3 Approximation:    Approximation:  Loose Post-procedure details:    Dressing:  Open (no dressing)   Patient tolerance of procedure:  Tolerated well, no immediate complications   (including critical care time)  Medications Ordered in ED Medications  acetaminophen (TYLENOL) tablet 650 mg (has no administration in time range)  aspirin EC tablet 325 mg (325 mg Oral Given 03/03/18 1509)  atorvastatin (LIPITOR) tablet 20 mg (has no administration in time range)  mirtazapine (REMERON) tablet 7.5 mg (has no administration in time range)  sertraline (ZOLOFT) tablet 50 mg (50 mg Oral Given 03/03/18 1508)  cholecalciferol (VITAMIN D) tablet 1,000 Units (1,000 Units Oral Given 03/03/18 1507)  sodium chloride flush (NS) 0.9 % injection 3 mL (3 mLs Intravenous Not Given 03/03/18 1509)  heparin injection 5,000 Units (5,000 Units Subcutaneous Given 03/03/18 1507)  dextrose 5 %-0.9 % sodium chloride infusion (has no administration in  time range)  senna-docusate (Senokot-S) tablet 1 tablet (has no administration in time range)  ondansetron (ZOFRAN) tablet 4 mg (has  no administration in time range)    Or  ondansetron (ZOFRAN) injection 4 mg (has no administration in time range)  polyethylene glycol (MIRALAX / GLYCOLAX) packet 17 g (17 g Oral Given 03/03/18 1508)  senna-docusate (Senokot-S) tablet 2 tablet (2 tablets Oral Given 03/03/18 1508)  ondansetron (ZOFRAN) injection 4 mg (4 mg Intravenous Given 03/03/18 1025)  sodium chloride 0.9 % bolus 500 mL (0 mLs Intravenous Stopped 03/03/18 1335)  lidocaine-EPINEPHrine (XYLOCAINE W/EPI) 2 %-1:200000 (PF) injection 20 mL (20 mLs Intradermal Given 03/03/18 1025)  sodium chloride 0.9 % bolus 500 mL (500 mLs Intravenous New Bag/Given 03/03/18 1509)     Initial Impression / Assessment and Plan / ED Course  I have reviewed the triage vital signs and the nursing notes.  Pertinent labs & imaging results that were available during my care of the patient were reviewed by me and considered in my medical decision making (see chart for details).     84 yo M with a chief complaint of a possible fall.  The patient remembers being nauseated this morning but nothing else and woke up in the EMS stretcher.  Per EMS the patient was found on the ground this morning.  Unknown downtime.  They thought he was in a new irregular heart rhythm.  The patient currently is nauseated but denies any other complaints.  Will obtain a CT of the head lab work give a bolus of fluids and reassess.  Patient is continued to have a somewhat irregular rhythm though has P waves.  He has an acute kidney injury, baseline of creatinine about 0.8 today was 2.34.  The patient feels that he has not been eating and drinking much for the past 2 or 3 days and is not sure why.  Patient has a bladder scan of 450.  Will attempt to have him urinate.  Will discuss with the hospitalist for admission.   The patients results and plan were  reviewed and discussed.   Any x-rays performed were independently reviewed by myself.   Differential diagnosis were considered with the presenting HPI.  Medications  acetaminophen (TYLENOL) tablet 650 mg (has no administration in time range)  aspirin EC tablet 325 mg (325 mg Oral Given 03/03/18 1509)  atorvastatin (LIPITOR) tablet 20 mg (has no administration in time range)  mirtazapine (REMERON) tablet 7.5 mg (has no administration in time range)  sertraline (ZOLOFT) tablet 50 mg (50 mg Oral Given 03/03/18 1508)  cholecalciferol (VITAMIN D) tablet 1,000 Units (1,000 Units Oral Given 03/03/18 1507)  sodium chloride flush (NS) 0.9 % injection 3 mL (3 mLs Intravenous Not Given 03/03/18 1509)  heparin injection 5,000 Units (5,000 Units Subcutaneous Given 03/03/18 1507)  dextrose 5 %-0.9 % sodium chloride infusion (has no administration in time range)  senna-docusate (Senokot-S) tablet 1 tablet (has no administration in time range)  ondansetron (ZOFRAN) tablet 4 mg (has no administration in time range)    Or  ondansetron (ZOFRAN) injection 4 mg (has no administration in time range)  polyethylene glycol (MIRALAX / GLYCOLAX) packet 17 g (17 g Oral Given 03/03/18 1508)  senna-docusate (Senokot-S) tablet 2 tablet (2 tablets Oral Given 03/03/18 1508)  ondansetron (ZOFRAN) injection 4 mg (4 mg Intravenous Given 03/03/18 1025)  sodium chloride 0.9 % bolus 500 mL (0 mLs Intravenous Stopped 03/03/18 1335)  lidocaine-EPINEPHrine (XYLOCAINE W/EPI) 2 %-1:200000 (PF) injection 20 mL (20 mLs Intradermal Given 03/03/18 1025)  sodium chloride 0.9 % bolus 500 mL (500 mLs Intravenous New Bag/Given 03/03/18 1509)  Vitals:   03/03/18 1130 03/03/18 1145 03/03/18 1215 03/03/18 1410  BP: 104/69 91/67 (!) 94/57 112/76  Pulse:   (!) 107 (!) 103  Resp:   16 18  Temp:    98.3 F (36.8 C)  TempSrc:    Oral  SpO2:   100% 98%    Final diagnoses:  AKI (acute kidney injury) ([place])  Laceration of scalp without foreign  body, initial encounter    Admission/ observation were discussed with the admitting physician, patient and/or family and they are comfortable with the plan.    Final Clinical Impressions(s) / ED Diagnoses   Final diagnoses:  AKI (acute kidney injury) ([place])  Laceration of scalp without foreign body, initial encounter    ED Discharge Orders    None       Deno Etienne, DO 03/03/18 1552

## 2018-03-04 ENCOUNTER — Other Ambulatory Visit: Payer: Self-pay

## 2018-03-04 ENCOUNTER — Encounter (HOSPITAL_COMMUNITY): Payer: Self-pay | Admitting: General Practice

## 2018-03-04 ENCOUNTER — Inpatient Hospital Stay (HOSPITAL_COMMUNITY): Payer: Medicare Other

## 2018-03-04 DIAGNOSIS — E43 Unspecified severe protein-calorie malnutrition: Secondary | ICD-10-CM

## 2018-03-04 DIAGNOSIS — N179 Acute kidney failure, unspecified: Secondary | ICD-10-CM

## 2018-03-04 DIAGNOSIS — D508 Other iron deficiency anemias: Secondary | ICD-10-CM

## 2018-03-04 DIAGNOSIS — R188 Other ascites: Secondary | ICD-10-CM

## 2018-03-04 DIAGNOSIS — R55 Syncope and collapse: Secondary | ICD-10-CM

## 2018-03-04 LAB — BASIC METABOLIC PANEL
Anion gap: 6 (ref 5–15)
BUN: 29 mg/dL — ABNORMAL HIGH (ref 8–23)
CO2: 27 mmol/L (ref 22–32)
Calcium: 8.2 mg/dL — ABNORMAL LOW (ref 8.9–10.3)
Chloride: 106 mmol/L (ref 98–111)
Creatinine, Ser: 2.23 mg/dL — ABNORMAL HIGH (ref 0.61–1.24)
GFR calc Af Amer: 29 mL/min — ABNORMAL LOW (ref >60)
GFR calc non Af Amer: 25 mL/min — ABNORMAL LOW (ref >60)
Glucose, Bld: 111 mg/dL — ABNORMAL HIGH (ref 70–99)
Potassium: 4 mmol/L (ref 3.5–5.1)
Sodium: 139 mmol/L (ref 135–145)

## 2018-03-04 LAB — ECHOCARDIOGRAM COMPLETE: Weight: 2523.2 oz

## 2018-03-04 LAB — CBC
HCT: 32 % — ABNORMAL LOW (ref 39.0–52.0)
Hemoglobin: 9.4 g/dL — ABNORMAL LOW (ref 13.0–17.0)
MCH: 27.4 pg (ref 26.0–34.0)
MCHC: 29.4 g/dL — ABNORMAL LOW (ref 30.0–36.0)
MCV: 93.3 fL (ref 78.0–100.0)
Platelets: 409 10*3/uL — ABNORMAL HIGH (ref 150–400)
RBC: 3.43 MIL/uL — ABNORMAL LOW (ref 4.22–5.81)
RDW: 15.2 % (ref 11.5–15.5)
WBC: 4.9 10*3/uL (ref 4.0–10.5)

## 2018-03-04 LAB — OCCULT BLOOD X 1 CARD TO LAB, STOOL: Fecal Occult Bld: POSITIVE — AB

## 2018-03-04 MED ORDER — INFLUENZA VAC SPLIT HIGH-DOSE 0.5 ML IM SUSY
0.5000 mL | PREFILLED_SYRINGE | INTRAMUSCULAR | Status: AC
  Administered 2018-03-05: 0.5 mL via INTRAMUSCULAR
  Filled 2018-03-04: qty 0.5

## 2018-03-04 MED ORDER — PANTOPRAZOLE SODIUM 40 MG PO TBEC
40.0000 mg | DELAYED_RELEASE_TABLET | Freq: Two times a day (BID) | ORAL | Status: DC
  Administered 2018-03-04 – 2018-03-13 (×18): 40 mg via ORAL
  Filled 2018-03-04 (×18): qty 1

## 2018-03-04 NOTE — Progress Notes (Signed)
  Echocardiogram 2D Echocardiogram has been performed.  [name] 03/04/2018, 11:04 AM

## 2018-03-04 NOTE — Clinical Social Work Note (Signed)
Patient is a long-term resident at [facility]. Left voicemail for HCPOA to confirm plan to return at discharge.  Dayton Scrape, [place]

## 2018-03-04 NOTE — Evaluation (Signed)
Physical Therapy Evaluation Patient Details Name: [name] MRN: [id number] DOB: 08-Jan-1930 Today's Date: 03/04/2018   History of Present Illness  84yo male brought to the ED by EMS from his facility, [facility], after being found down at the facility. Appeared to have syncopal episode in bathroom. CT head negative for acute changes. PMH exertional dyspnea, HTN, PAD, CVA, cardiac cath   Clinical Impression   Patient received in bed, pleasant and willing to participate in PT this morning. Able to complete functional bed mobility with S, required min guard for stand-pivot transfer to bedside commode with no device; had liquid BM and was totalA for pericare following toileting. Able to perform sit to stand with RW and S, then able to ambulate approximately 247f with RW and S, no signs of DOE or SOB noted. He was left up in the chair with all needs met, chair alarm active, and stating "I feel like that walk really pepped me up!". He will continue to benefit from skilled PT services in the acute setting, and certainly appears appropriate for return to his living facility ([facility] where he would also benefit from in-house skilled PT services.     Follow Up Recommendations Other (comment)(return to [facility] PT at this facility )    Equipment Recommendations  None recommended by PT    Recommendations for Other Services       Precautions / Restrictions Precautions Precautions: Fall Restrictions Weight Bearing Restrictions: No      Mobility  Bed Mobility Overal bed mobility: Needs Assistance Bed Mobility: Supine to Sit     Supine to sit: Supervision     General bed mobility comments: S for safety, no physical assist given   Transfers Overall transfer level: Needs assistance Equipment used: Rolling walker (2 wheeled);None Transfers: Sit to/from [employer] Stand: Supervision Stand pivot transfers: Min guard       General  transfer comment: able to perform sit to stand transfers with RW and S, requires min guard for safety for stand pivot with no device   Ambulation/Gait Ambulation/Gait assistance: Supervision Gait Distance (Feet): 200 Feet Assistive device: Rolling walker (2 wheeled) Gait Pattern/deviations: Step-through pattern;Decreased step length - right;Decreased stride length;Decreased step length - left Gait velocity: decreased    General Gait Details: no physical assist given, no signs of DOE or shortness of breath noted   Stairs            Wheelchair Mobility    Modified Rankin (Stroke Patients Only)       Balance Overall balance assessment: No apparent balance deficits (not formally assessed)                                           Pertinent Vitals/Pain Pain Assessment: No/denies pain    Home Living Family/patient expects to be discharged to:: Skilled nursing facility                 Additional Comments: permanent [facility] at [facility]    Prior Function Level of Independence: Independent with assistive device(s)         Comments: has cane and RW, often gets up and walks without them; reports he was pretty independent at his facility      Hand Dominance        Extremity/Trunk Assessment   Upper Extremity Assessment Upper Extremity Assessment: Defer  to OT evaluation    Lower Extremity Assessment Lower Extremity Assessment: Generalized weakness    Cervical / Trunk Assessment Cervical / Trunk Assessment: Normal  Communication   Communication: No difficulties  Cognition Arousal/Alertness: Awake/alert Behavior During Therapy: WFL for tasks assessed/performed Overall Cognitive Status: Within Functional Limits for tasks assessed                                        General Comments      Exercises     Assessment/Plan    PT Assessment Patient needs continued PT services  PT Problem List Decreased  strength;Decreased mobility;Decreased safety awareness;Decreased coordination;Decreased activity tolerance;Decreased balance       PT Treatment Interventions DME instruction;Therapeutic activities;Gait training;Therapeutic exercise;Patient/family education;Stair training;Balance training;Functional mobility training;Neuromuscular re-education    PT Goals (Current goals can be found in the Care Plan section)  Acute Rehab PT Goals Patient Stated Goal: to get stronger  PT Goal Formulation: With patient Time For Goal Achievement: 03/18/18 Potential to Achieve Goals: Good    Frequency Min 3X/week   Barriers to discharge        Co-evaluation               AM-PAC PT "6 Clicks" Daily Activity  Outcome Measure Difficulty turning over in bed (including adjusting bedclothes, sheets and blankets)?: A Little Difficulty moving from lying on back to sitting on the side of the bed? : A Little Difficulty sitting down on and standing up from a chair with arms (e.g., wheelchair, bedside commode, etc,.)?: A Little Help needed moving to and from a bed to chair (including a wheelchair)?: A Little Help needed walking in hospital room?: A Little Help needed climbing 3-5 steps with a railing? : A Little 6 Click Score: 18    End of Session   Activity Tolerance: Patient tolerated treatment well Patient left: in chair;with chair alarm set;with call bell/phone within reach   PT Visit Diagnosis: Muscle weakness (generalized) (M62.81);History of falling (Z91.81)    Time: 4035-2481 PT Time Calculation (min) (ACUTE ONLY): 26 min   Charges:   PT Evaluation $PT Eval Low Complexity: 1 Low PT Treatments $Gait Training: 8-22 mins        Deniece Ree PT, DPT, CBIS  Supplemental Physical Therapist [place]    Pager [phone] Acute Rehab Office [phone]

## 2018-03-04 NOTE — Consult Note (Signed)
[facility] CONSULT Reason for consult: Guaiac positive stool with drop in hemoglobin Referring Physician: [facility] hospitalist  [name] is an 84 y.o. male.  HPI: He is originally from [place] and moved to [place] in the 1930s.  He has problems with dementia, high blood pressure, hypertension and has had a recent stroke.  He passed out at the nursing home where he is living.  He went to use the bathroom had some abdominal pain which she was attributing to constipation and then woke up in the ambulance.  He states that he is been progressively constipated for 3 weeks.  He has had no fever has had no clear bleeding has had a cough.  He states that he feels full and has had poor appetite.  In the ER he was found to have dilated bladder had been unable to urinate.  He had a CT of his head which was negative for any acute injury.CT of his abdomen revealed diffuse ascites and suggested retroperitoneal adenopathy difficult to be sure because of the ascites.  Due to his elevated renal function this study had to be done without contrast.  There is a rather large abdominal wall lipoma.  The patient is unable to tell me if he is ever had ulcers, GI bleeding etc.His hemoglobin was 14.36 months ago and is dropped.  It was 10.7 on admission and now is down to 9.4.Patient tells me that he was a very heavy drinker in the past.  He said he stopped drinking several years ago does not remember when.  He drank beer whiskey on a regular daily basis.  His liver test on admission here revealed an albumin of 2.3 total bilirubin 1.0.  Also of note, his BUN and creatinine were markedly elevated from 6 months ago.  Patient is due to have a paracentesis tomorrow.  He has had previous cholecystectomy with ERCP for stone removal several years ago.  Past Medical History:  Diagnosis Date  . BPH (benign prostatic hyperplasia)   . Cataract   . Essential hypertension 05/08/2013  . Exertional dyspnea    with exertion   . Hypertension   . Mitral valvular regurgitation June 2014   Mild to moderate MR on echocardiogram  . PAD (peripheral artery disease) ([place])  December 2014   Lower extremity Dopplers/ABIs: RABI 0.29, LABI 0.66; bilateral external iliacs with significant diameter reduction; R. SFA occlusive disease throughout into popliteal artery with 0 vessel runoff. Anterior tibial reconstitutes distally. 70-99% reduction in all SFA. One-vessel runoff (anterior tibial)  . Stroke ([place])   . Syncope and collapse 03/03/2018    Past Surgical History:  Procedure Laterality Date  . ABDOMINAL ANGIOGRAM  07/07/2013   Procedure: ABDOMINAL ANGIOGRAM;  Surgeon: Lorretta Harp, MD;  Location: [facility] CATH LAB;  Service: Cardiovascular;;  . CHOLECYSTECTOMY  07/04/2010  . ERCP N/A 11/27/2012   Procedure: ENDOSCOPIC RETROGRADE CHOLANGIOPANCREATOGRAPHY (ERCP);  Surgeon: Inda Castle, MD;  Location: [facility];  Service: Endoscopy;  Laterality: N/A;  . EYE SURGERY Right   . LEFT HEART CATHETERIZATION WITH CORONARY ANGIOGRAM N/A 07/07/2013   Procedure: LEFT HEART CATHETERIZATION WITH CORONARY ANGIOGRAM;  Surgeon: Lorretta Harp, MD;  Location: [facility] CATH LAB;  Service: Cardiovascular;  Laterality: N/A;  . LOWER EXTREMITY ANGIOGRAM N/A 07/07/2013   Procedure: LOWER EXTREMITY ANGIOGRAM;  Surgeon: Lorretta Harp, MD;  Location: [facility] CATH LAB;  Service: Cardiovascular;  Laterality: N/A;  . NM MYOVIEW LTD  December 2014   Apical thinning but no ischemia. EF 55%  .  PROSTATE SURGERY     Dr. Lowella Bandy  . TRANSTHORACIC ECHOCARDIOGRAM  June 2014   Basal septal thickening with moderate LVH. EF 65%. Normal wall motion. Diastolic dysfunction/NOS. Aortic sclerosis without stenosis. Mild to moderate MR    Family History  Problem Relation Age of Onset  . Diabetes Sister   . Cancer Sister   . Cancer Mother   . Cancer Father   . Cancer Sister     Social History:  reports that he quit smoking about 37 years ago. His smoking use  included cigarettes. He has never used smokeless tobacco. He reports that he drank about 4.0 standard drinks of alcohol per week. He reports that he does not use drugs.  Allergies: No Known Allergies  Medications; Prior to Admission medications   Medication Sig Start Date End Date Taking? Authorizing Provider  acetaminophen (TYLENOL) 325 MG tablet Take 2 tablets (650 mg total) by mouth every 6 (six) hours as needed for mild pain (or Fever >/= 101). 08/14/17  Yes Oretha Milch D, MD  aspirin EC 325 MG tablet Take 1 tablet (325 mg total) by mouth daily. 10/30/17  Yes Rosalin Hawking, MD  atorvastatin (LIPITOR) 20 MG tablet Take 20 mg by mouth at bedtime. 09/13/17  Yes [provider]  Cholecalciferol 1000 units tablet Take 1,000 Units by mouth daily. 12/11/17  Yes [provider]  lisinopril (PRINIVIL,ZESTRIL) 40 MG tablet Take 1 tablet (40 mg total) by mouth daily. 08/14/17  Yes Oretha Milch D, MD  mirtazapine (REMERON) 15 MG tablet Take 7.5 mg by mouth at bedtime.   Yes [provider]  polyethylene glycol (MIRALAX / GLYCOLAX) packet Take 17 g by mouth daily.   Yes [provider]  sertraline (ZOLOFT) 50 MG tablet Take 1 tablet (50 mg total) by mouth daily. 10/30/17  Yes Rosalin Hawking, MD   . atorvastatin  20 mg Oral QHS  . cholecalciferol  1,000 Units Oral Daily  . heparin  5,000 Units Subcutaneous Q8H  . [START ON 03/05/2018] Influenza vac split quadrivalent PF  0.5 mL Intramuscular Tomorrow-1000  . mirtazapine  7.5 mg Oral QHS  . pantoprazole  40 mg Oral BID  . polyethylene glycol  17 g Oral Daily  . senna-docusate  2 tablet Oral BID  . sertraline  50 mg Oral Daily  . sodium chloride flush  3 mL Intravenous Q12H   PRN Meds acetaminophen, ondansetron **OR** ondansetron (ZOFRAN) IV, senna-docusate Results for orders placed or performed during the hospital encounter of 03/03/18 (from the past 48 hour(s))  CBC with Differential     Status: Abnormal   Collection  Time: 03/03/18  9:19 AM  Result Value Ref Range   WBC 6.2 4.0 - 10.5 K/uL   RBC 3.85 (L) 4.22 - 5.81 MIL/uL   Hemoglobin 10.7 (L) 13.0 - 17.0 g/dL   HCT 36.2 (L) 39.0 - 52.0 %   MCV 94.0 78.0 - 100.0 fL   MCH 27.8 26.0 - 34.0 pg   MCHC 29.6 (L) 30.0 - 36.0 g/dL   RDW 15.0 11.5 - 15.5 %   Platelets 379 150 - 400 K/uL   Neutrophils Relative % 73 %   Neutro Abs 4.5 1.7 - 7.7 K/uL   Lymphocytes Relative 17 %   Lymphs Abs 1.1 0.7 - 4.0 K/uL   Monocytes Relative 7 %   Monocytes Absolute 0.4 0.1 - 1.0 K/uL   Eosinophils Relative 2 %   Eosinophils Absolute 0.1 0.0 - 0.7 K/uL   Basophils  Relative 1 %   Basophils Absolute 0.0 0.0 - 0.1 K/uL   Immature Granulocytes 0 %   Abs Immature Granulocytes 0.0 0.0 - 0.1 K/uL    Comment: Performed at [facility], [place] [address], [place], [place] [address]  Comprehensive metabolic panel     Status: Abnormal   Collection Time: 03/03/18  9:19 AM  Result Value Ref Range   Sodium 138 135 - 145 mmol/L   Potassium 4.9 3.5 - 5.1 mmol/L   Chloride 103 98 - 111 mmol/L   CO2 25 22 - 32 mmol/L   Glucose, Bld 97 70 - 99 mg/dL   BUN 30 (H) 8 - 23 mg/dL   Creatinine, Ser 2.34 (H) 0.61 - 1.24 mg/dL   Calcium 8.3 (L) 8.9 - 10.3 mg/dL   Total Protein 4.9 (L) 6.5 - 8.1 g/dL   Albumin 2.3 (L) 3.5 - 5.0 g/dL   AST 19 15 - 41 U/L   ALT 9 0 - 44 U/L   Alkaline Phosphatase 98 38 - 126 U/L   Total Bilirubin 1.0 0.3 - 1.2 mg/dL   GFR calc non Af Amer 24 (L) >60 mL/min   GFR calc Af Amer 27 (L) >60 mL/min    Comment: (NOTE) The eGFR has been calculated using the CKD EPI equation. This calculation has not been validated in all clinical situations. eGFR's persistently <60 mL/min signify possible Chronic Kidney Disease.    Anion gap 10 5 - 15    Comment: Performed at [facility] [address] [place], [place] [address]  Lipase, blood     Status: None   Collection Time: 03/03/18  9:19 AM  Result Value Ref Range   Lipase 31 11 - 51 U/L    Comment:  Performed at [facility] [address] [place], [place] [address]  Magnesium     Status: None   Collection Time: 03/03/18  9:19 AM  Result Value Ref Range   Magnesium 1.8 1.7 - 2.4 mg/dL    Comment: Performed at [facility] [address], [place], [place] [address]  CK     Status: Abnormal   Collection Time: 03/03/18  9:19 AM  Result Value Ref Range   Total CK 47 (L) 49 - 397 U/L    Comment: Performed at [facility], [place] [address], [place], [place] [address]  I-stat troponin, ED     Status: None   Collection Time: 03/03/18 10:09 AM  Result Value Ref Range   Troponin i, poc 0.00 0.00 - 0.08 ng/mL   Comment 3            Comment: Due to the release kinetics of cTnI, a negative result within the first hours of the onset of symptoms does not rule out myocardial infarction with certainty. If myocardial infarction is still suspected, repeat the test at appropriate intervals.   Troponin I     Status: None   Collection Time: 03/03/18  2:16 PM  Result Value Ref Range   Troponin I <0.03 <0.03 ng/mL    Comment: Performed at [facility], [place] [address] [place], [place] [address]  TSH     Status: None   Collection Time: 03/03/18  2:16 PM  Result Value Ref Range   TSH 2.318 0.350 - 4.500 uIU/mL    Comment: Performed by a 3rd Generation assay with a functional sensitivity of <=0.01 uIU/mL. Performed at [facility], [address] [place], [place] [address]   Iron and TIBC     Status: Abnormal   Collection Time: 03/03/18  2:16 PM  Result Value Ref Range   Iron 20 (L) 45 - 182 ug/dL   TIBC 218 (L) 250 - 450 ug/dL   Saturation Ratios 9 (L) 17.9 - 39.5 %   UIBC 198 ug/dL    Comment: Performed at [facility] [address] [place], [place] [address]  MRSA PCR Screening     Status: None   Collection Time: 03/03/18  3:20 PM  Result Value Ref Range   MRSA by PCR NEGATIVE NEGATIVE    Comment:        The GeneXpert MRSA Assay (FDA approved for  NASAL specimens only), is one component of a comprehensive MRSA colonization surveillance program. It is not intended to diagnose MRSA infection nor to guide or monitor treatment for MRSA infections. Performed at [facility], [place] [address] [place], [place] [address]   Urinalysis, Routine w reflex microscopic     Status: Abnormal   Collection Time: 03/03/18  4:51 PM  Result Value Ref Range   Color, Urine YELLOW YELLOW   APPearance HAZY (A) CLEAR   Specific Gravity, Urine 1.015 1.005 - 1.030   pH 5.0 5.0 - 8.0   Glucose, UA NEGATIVE NEGATIVE mg/dL   Hgb urine dipstick NEGATIVE NEGATIVE   Bilirubin Urine NEGATIVE NEGATIVE   Ketones, ur 5 (A) NEGATIVE mg/dL   Protein, ur 30 (A) NEGATIVE mg/dL   Nitrite NEGATIVE NEGATIVE   Leukocytes, UA NEGATIVE NEGATIVE   RBC / HPF 0-5 0 - 5 RBC/hpf   WBC, UA 6-10 0 - 5 WBC/hpf   Bacteria, UA RARE (A) NONE SEEN   Squamous Epithelial / LPF 0-5 0 - 5   Mucus PRESENT     Comment: Performed at [facility], [address], [place], [place] [address]  Troponin I     Status: None   Collection Time: 03/03/18  8:22 PM  Result Value Ref Range   Troponin I <0.03 <0.03 ng/mL    Comment: Performed at [facility] [address], [place], [place] [address]  Occult blood card to lab, stool     Status: Abnormal   Collection Time: 03/04/18 12:24 AM  Result Value Ref Range   Fecal Occult Bld POSITIVE (A) NEGATIVE    Comment: Performed at [facility], [place] [address], [place], [place] [address]  Basic metabolic panel     Status: Abnormal   Collection Time: 03/04/18  6:09 AM  Result Value Ref Range   Sodium 139 135 - 145 mmol/L   Potassium 4.0 3.5 - 5.1 mmol/L   Chloride 106 98 - 111 mmol/L   CO2 27 22 - 32 mmol/L   Glucose, Bld 111 (H) 70 - 99 mg/dL   BUN 29 (H) 8 - 23 mg/dL   Creatinine, Ser 2.23 (H) 0.61 - 1.24 mg/dL   Calcium 8.2 (L) 8.9 - 10.3 mg/dL   GFR calc non Af Amer 25 (L) >60 mL/min   GFR calc Af Amer 29 (L) >60  mL/min    Comment: (NOTE) The eGFR has been calculated using the CKD EPI equation. This calculation has not been validated in all clinical situations. eGFR's persistently <60 mL/min signify possible Chronic Kidney Disease.    Anion gap 6 5 - 15    Comment: Performed at [facility] [address],  [place], [place] [address]  CBC     Status: Abnormal   Collection Time: 03/04/18  6:09 AM  Result Value Ref Range   WBC 4.9 4.0 - 10.5 K/uL   RBC 3.43 (L) 4.22 - 5.81 MIL/uL   Hemoglobin 9.4 (L) 13.0 - 17.0 g/dL   HCT 32.0 (L) 39.0 - 52.0 %   MCV 93.3 78.0 - 100.0 fL   MCH 27.4 26.0 - 34.0 pg   MCHC 29.4 (L) 30.0 - 36.0 g/dL   RDW 15.2 11.5 - 15.5 %   Platelets 409 (H) 150 - 400 K/uL    Comment: Performed at [facility], [place] [address], [place], [place] [address]    Ct Abdomen Wo Contrast  Result Date: 03/03/2018 CLINICAL DATA:  Abdominal mass. Appears to be an abdominal wall hernia versus rectus sheath hematoma or mass. EXAM: CT ABDOMEN WITHOUT CONTRAST TECHNIQUE: Multidetector CT imaging of the abdomen was performed following the standard protocol without IV contrast. COMPARISON:  February 25, 2016 FINDINGS: Lower chest: Mild atelectasis in the bases.  Small effusions. Hepatobiliary: Pneumobilia is consistent with previous sphincterotomy, unchanged, in the left hepatic lobe. The patient's known left hepatic mass is not well assessed without contrast on today's study. This has the appearance of a hemangioma on previous imaging. The liver is otherwise unremarkable. The contour of the liver is smooth and not nodular. The patient is status post cholecystectomy. Pancreas: Unremarkable. No pancreatic ductal dilatation or surrounding inflammatory changes. Spleen: Normal in size without focal abnormality. Adrenals/Urinary Tract: Adrenal glands are unremarkable. Kidneys are normal, without renal calculi, focal lesion, or hydronephrosis. Stomach/Bowel: The stomach and small bowel are normal  without identified obstruction. The colon is decompressed but does contain contrast. No obvious colonic abnormality. The cecum and appendix, if the patient has 1, are below today's film. Vascular/Lymphatic: Atherosclerotic changes in the nonaneurysmal aorta. Probable shotty and mildly enlarged retroperitoneal nodes, poorly evaluated due to lack of contrast and lack of intra-abdominal fat. An apparent left periaortic node on image 35 measures 15 mm in short axis. Other: Diffuse ascites of uncertain etiology. Lipoma in the right abdominal wall as seen on series 3, image 34. No other abdominal wall mass. Tiny periumbilical hernia. No other hernia is noted. No rectus muscle hematoma identified. Musculoskeletal: No acute or significant osseous findings. IMPRESSION: 1. Diffuse ascites of uncertain etiology. 2. Suggested retroperitoneal adenopathy, poorly evaluated due to lack of contrast and lack of intra-abdominal fat. The nodes are nonspecific but could be reactive or neoplastic. Recommend follow-up after resolution of the patient's ascites for better evaluation. 3. Tiny pleural effusions and mild bibasilar atelectasis. 4. Atherosclerotic changes in the nonaneurysmal aorta. 5. Right abdominal wall lipoma. 6. Tiny periumbilical fat containing hernia. Electronically Signed   By: Dorise Bullion III M.D   On: 03/03/2018 18:17   Dg Chest 2 View  Result Date: 03/03/2018 CLINICAL DATA:  Unwitnessed fall.  Hypertension. EXAM: CHEST - 2 VIEW COMPARISON:  August 09, 2017 FINDINGS: There is atelectatic change in the right mid lower lung zones. There is no edema or consolidation. Heart is borderline enlarged with pulmonary vascularity normal. Aorta is prominent and rather tortuous, stable. There are metallic fragments in the left hemithorax. No pneumothorax. No bone lesions. IMPRESSION: No edema or consolidation. Areas of mild atelectatic change on the right. Stable cardiac silhouette. Aortic prominence and tortuosity likely  reflect chronic hypertensive change. Electronically Signed   By: Lowella Grip III M.D.   On: 03/03/2018 11:44   Ct Head  Wo Contrast  Result Date: 03/03/2018 CLINICAL DATA:  Unwitnessed fall EXAM: CT HEAD WITHOUT CONTRAST TECHNIQUE: Contiguous axial images were obtained from the base of the skull through the vertex without intravenous contrast. COMPARISON:  Head CT August 09, 2017 and brain MRI February 08, 2018 FINDINGS: Brain: There is moderate diffuse atrophy. There is no intracranial mass, hemorrhage, extra-axial fluid collection, or midline shift. There is evidence of a prior infarct involving much of the left occipital lobe, stable. There is evidence of a prior infarct in the mid right occipital lobe, stable. There is evidence of a prior infarct at the gray-white junction of the anterior right parietal lobe, stable. Elsewhere there is patchy small vessel disease in the centra semiovale bilaterally. There is no acute appearing infarct. Basal ganglia calcification bilaterally is a physiologic finding. A small calcification in the mid right cerebellum may represent a small granuloma. This finding was present on prior study. Vascular: There is no hyperdense vessel. There is calcification in the left vertebral artery as well as in both carotid siphon regions. Skull: Bony calvarium appears intact. There is a right parietal scalp hematoma. Sinuses/Orbits: There is opacification in a posterior ethmoid air cell. There is mucosal thickening in several ethmoid air cells bilaterally. There is inward bowing of each medial orbital wall, a finding that may be congenital or may be due to previous trauma. This finding is stable. No intraorbital lesions are evident. Other: Mastoid air cells are clear. IMPRESSION: 1. Right parietal scalp hematoma with underlying bony calvarium intact. 2. Atrophy with supratentorial small vessel disease. Prior infarcts as noted, largest in the occipital lobes. 3.  No mass or hemorrhage.  No  extra-axial fluid collection. 4.  Foci of arterial vascular calcification. 5.  Ethmoid sinus disease noted. Electronically Signed   By: Lowella Grip III M.D.   On: 03/03/2018 11:21              Blood pressure 110/68, pulse 81, temperature 98.4 F (36.9 C), temperature source Oral, resp. rate 18, weight 71.5 kg, SpO2 100 %.  Physical exam:   General--very pleasant African-American male no acute distress ENT--nonicteric Neck--supple Heart--regular rate and rhythm without murmurs or gallops Lungs--clear Abdomen--marked distention with fullness in the right upper quadrant probably from known lipoma.  Shifting dullness consistent with ascites Psych--very pleasant but memory seems somewhat poor.   Assessment: 1.  Syncopal episode of unclear cause.  His stools are positive but has not had any gross bleeding.  Patient is unclear if he is ever had endoscopy or colonoscopy.  He is unaware if he is ever had ulcers. 2.  Azotemia.  This is new and may be related to his ascites. 3.  Ascites.  We do not get a very good look at the liver because his CT had to be done without contrast.  Patient does have a very heavy drinking history.  He is scheduled for paracentesis 4.  Drop in hemoglobin with guaiac positive stools  Plan: 1.  We will go ahead with paracentesis as planned.  Would be sure that we obtain cell count differential, cytology, albumin on ascitic fluid. 2.  We will call his power of attorney.  He will likely need EGD and colonoscopy in the future but at this point will treat empirically with pantoprazole and go ahead and withdraw some ascitic fluid for analysis.  Hopefully his renal function will improve.   Nancy Fetter 03/04/2018, 4:39 PM   This note was created using voice recognition software and minor  errors may Have occurred unintentionally. Pager: [phone] If no answer or after hours call [phone]

## 2018-03-04 NOTE — Progress Notes (Addendum)
PROGRESS NOTE                                                                                                                                                                                                             Patient Demographics:    [name], is a 84 y.o. male, DOB - [birth year], OZD:[id number]  Admit date - 03/03/2018   Admitting Physician Janora Norlander, MD  Outpatient Primary MD for the patient is Patient, [name]  LOS - 1  Outpatient Specialists: none  Chief Complaint  Patient presents with  . Fall       Brief Narrative  84 year old male with history of hypertension, CVA (left inferior MCA infarct in March this year), mild dementia, hyperlipidemia, PAD, BPH resident of [facility] brought to ED by EMS after found on the floor of his facility.  Patient says he may have slipped on something but does not recall and thinks he passed out.  Patient complained of some abdominal discomfort and was trying to strain in the bathroom when he passed out.  Reportedly his systolic blood pressure was in the 80s when he was found. Patient reports being constipated frequently for the past 3 weeks associated with poor p.o. intake, significant weight loss and right-sided abdominal pain with occasional vomiting.  No fevers or chills. In the ED vitals were stable.  EKG showed irregular rhythm with P waves and dropped QRS complexes.  Noted for 450 cc urine in his bladder scan and was unable to urinate.  Blood work showed 2 g drop in H&H with positive Hemoccult, acute kidney injury.  Head CT was negative for acute findings. Admitted for further management.   Subjective:   Patient reports feeling weak.  Denies any nausea or vomiting.  No further abdominal pain.   Assessment  & Plan :    Principal Problem:   Syncope and collapse Suspect associated with dehydration with poor p.o. intake for past few weeks.  Repeat EKG  appears normal.  Serial troponin negative.  Stable on telemetry. Continue IV hydration.  Monitor I/O.  PT evaluation.  Head CT negative for acute findings.  Active Problems:  AKI (acute kidney injury) ([place]) Suspect prerenal ATN due to dehydration. Patient's baseline creatinine is around 0.6 with over 3 times increase in the creatinine level.  Avoid nephrotoxins.  Monitor with IV fluids.  Monitor I/O.  Check renal ultrasound.  Hold lisinopril.  Protein calorie malnutrition, severe Nutrition consulted.  Patient reports very poor p.o. intake and significant weight loss.  Ascites As seen on CT abdomen without contrast.  History of liver hemangioma which is appreciated due to lack of IV contrast.  Will obtain paracentesis and check fluid for cell count, culture and cytology.  LFTs are normal.  History of CVA Left inferior MCA infarct in March 2019.  Continue  Lipitor, hold aspirin with concern for GI bleed..  Recent follow-up MRI of the brain done 3 weeks back shows expected evolution of the stroke.   Iron deficiency anemia Has almost 3 g drop in hemoglobin.  Patient does report passing dark stools.  Will consult GI given presentation of syncope with possible GI bleed. Add PPI.  Generalized weakness PT evaluation.  Reports using both a walker and cane at the facility. Constipation Added senna-Colace and MiraLAX.  Code Status : DNR  Family Communication  : None at bedside  Disposition Plan  : Return to SNF once improved  Barriers For Discharge : Active symptoms  Consults  : GI  Procedures  : CT head, CT abdomen without contrast  DVT Prophylaxis  : Subcu heparin Lab Results  Component Value Date   PLT 409 (H) 03/04/2018    Antibiotics  :    Anti-infectives (From admission, onward)   None        Objective:   Vitals:   03/03/18 1410 03/03/18 2046 03/04/18 0515 03/04/18 0521  BP: 112/76 123/84  120/60  Pulse: (!) 103 95  90  Resp: 18 18  19   Temp: 98.3 F (36.8 C) (!)  97.5 F (36.4 C)  98.9 F (37.2 C)  TempSrc: Oral Oral  Oral  SpO2: 98% 98%  99%  Weight:   71.5 kg     Wt Readings from Last 3 Encounters:  03/04/18 71.5 kg  01/30/18 72.6 kg  01/07/18 72.6 kg     Intake/Output Summary (Last 24 hours) at 03/04/2018 0926 Last data filed at 03/04/2018 0515 Gross per 24 hour  Intake 2080.61 ml  Output 900 ml  Net 1180.61 ml     Physical Exam  Gen: Elderly male, appears fatigued, not in distress HEENT: Pallor present, moist mucosa, supple neck, temporal wasting Chest: clear b/l, no added sounds CVS: N S1&S2, no murmurs, rubs or gallop GI: soft, moderate abdominal distention, nontender, bowel sounds present Musculoskeletal: warm, no edema CNS: Alert and oriented, nonfocal    Data Review:    CBC Recent Labs  Lab 03/03/18 0919 03/04/18 0609  WBC 6.2 4.9  HGB 10.7* 9.4*  HCT 36.2* 32.0*  PLT 379 409*  MCV 94.0 93.3  MCH 27.8 27.4  MCHC 29.6* 29.4*  RDW 15.0 15.2  LYMPHSABS 1.1  --   MONOABS 0.4  --   EOSABS 0.1  --   BASOSABS 0.0  --     Chemistries  Recent Labs  Lab 03/03/18 0919 03/04/18 0609  NA 138 139  K 4.9 4.0  CL 103 106  CO2 25 27  GLUCOSE 97 111*  BUN 30* 29*  CREATININE 2.34* 2.23*  CALCIUM 8.3* 8.2*  MG 1.8  --   AST 19  --   ALT 9  --   ALKPHOS 98  --   BILITOT 1.0  --    ------------------------------------------------------------------------------------------------------------------ No results for input(s): CHOL, HDL, LDLCALC, TRIG, CHOLHDL, LDLDIRECT in the last 72 hours.  Lab  Results  Component Value Date   HGBA1C 5.2 08/10/2017   ------------------------------------------------------------------------------------------------------------------ Recent Labs    03/03/18 1416  TSH 2.318   ------------------------------------------------------------------------------------------------------------------ Recent Labs    03/03/18 1416  TIBC 218*  IRON 20*    Coagulation profile No  results for input(s): INR, PROTIME in the last 168 hours.  No results for input(s): DDIMER in the last 72 hours.  Cardiac Enzymes Recent Labs  Lab 03/03/18 1416 03/03/18 2022  TROPONINI <0.03 <0.03   ------------------------------------------------------------------------------------------------------------------ No results found for: BNP  Inpatient Medications  Scheduled Meds: . aspirin EC  325 mg Oral Daily  . atorvastatin  20 mg Oral QHS  . cholecalciferol  1,000 Units Oral Daily  . heparin  5,000 Units Subcutaneous Q8H  . mirtazapine  7.5 mg Oral QHS  . polyethylene glycol  17 g Oral Daily  . senna-docusate  2 tablet Oral BID  . sertraline  50 mg Oral Daily  . sodium chloride flush  3 mL Intravenous Q12H   Continuous Infusions: . dextrose 5 % and 0.9% NaCl 100 mL/hr at 03/03/18 1553   PRN Meds:.acetaminophen, ondansetron **OR** ondansetron (ZOFRAN) IV, senna-docusate  Micro Results Recent Results (from the past 240 hour(s))  MRSA PCR Screening     Status: None   Collection Time: 03/03/18  3:20 PM  Result Value Ref Range Status   MRSA by PCR NEGATIVE NEGATIVE Final    Comment:        The GeneXpert MRSA Assay (FDA approved for NASAL specimens only), is one component of a comprehensive MRSA colonization surveillance program. It is not intended to diagnose MRSA infection nor to guide or monitor treatment for MRSA infections. Performed at [facility], [place] [address], [place], [place] [address]     Radiology Reports Ct Abdomen Wo Contrast  Result Date: 03/03/2018 CLINICAL DATA:  Abdominal mass. Appears to be an abdominal wall hernia versus rectus sheath hematoma or mass. EXAM: CT ABDOMEN WITHOUT CONTRAST TECHNIQUE: Multidetector CT imaging of the abdomen was performed following the standard protocol without IV contrast. COMPARISON:  February 25, 2016 FINDINGS: Lower chest: Mild atelectasis in the bases.  Small effusions. Hepatobiliary: Pneumobilia is  consistent with previous sphincterotomy, unchanged, in the left hepatic lobe. The patient's known left hepatic mass is not well assessed without contrast on today's study. This has the appearance of a hemangioma on previous imaging. The liver is otherwise unremarkable. The contour of the liver is smooth and not nodular. The patient is status post cholecystectomy. Pancreas: Unremarkable. No pancreatic ductal dilatation or surrounding inflammatory changes. Spleen: Normal in size without focal abnormality. Adrenals/Urinary Tract: Adrenal glands are unremarkable. Kidneys are normal, without renal calculi, focal lesion, or hydronephrosis. Stomach/Bowel: The stomach and small bowel are normal without identified obstruction. The colon is decompressed but does contain contrast. No obvious colonic abnormality. The cecum and appendix, if the patient has 1, are below today's film. Vascular/Lymphatic: Atherosclerotic changes in the nonaneurysmal aorta. Probable shotty and mildly enlarged retroperitoneal nodes, poorly evaluated due to lack of contrast and lack of intra-abdominal fat. An apparent left periaortic node on image 35 measures 15 mm in short axis. Other: Diffuse ascites of uncertain etiology. Lipoma in the right abdominal wall as seen on series 3, image 34. No other abdominal wall mass. Tiny periumbilical hernia. No other hernia is noted. No rectus muscle hematoma identified. Musculoskeletal: No acute or significant osseous findings. IMPRESSION: 1. Diffuse ascites of uncertain etiology. 2. Suggested retroperitoneal adenopathy, poorly evaluated due to lack of contrast  and lack of intra-abdominal fat. The nodes are nonspecific but could be reactive or neoplastic. Recommend follow-up after resolution of the patient's ascites for better evaluation. 3. Tiny pleural effusions and mild bibasilar atelectasis. 4. Atherosclerotic changes in the nonaneurysmal aorta. 5. Right abdominal wall lipoma. 6. Tiny periumbilical fat  containing hernia. Electronically Signed   By: Dorise Bullion III M.D   On: 03/03/2018 18:17   Dg Chest 2 View  Result Date: 03/03/2018 CLINICAL DATA:  Unwitnessed fall.  Hypertension. EXAM: CHEST - 2 VIEW COMPARISON:  August 09, 2017 FINDINGS: There is atelectatic change in the right mid lower lung zones. There is no edema or consolidation. Heart is borderline enlarged with pulmonary vascularity normal. Aorta is prominent and rather tortuous, stable. There are metallic fragments in the left hemithorax. No pneumothorax. No bone lesions. IMPRESSION: No edema or consolidation. Areas of mild atelectatic change on the right. Stable cardiac silhouette. Aortic prominence and tortuosity likely reflect chronic hypertensive change. Electronically Signed   By: Lowella Grip III M.D.   On: 03/03/2018 11:44   Ct Head Wo Contrast  Result Date: 03/03/2018 CLINICAL DATA:  Unwitnessed fall EXAM: CT HEAD WITHOUT CONTRAST TECHNIQUE: Contiguous axial images were obtained from the base of the skull through the vertex without intravenous contrast. COMPARISON:  Head CT August 09, 2017 and brain MRI February 08, 2018 FINDINGS: Brain: There is moderate diffuse atrophy. There is no intracranial mass, hemorrhage, extra-axial fluid collection, or midline shift. There is evidence of a prior infarct involving much of the left occipital lobe, stable. There is evidence of a prior infarct in the mid right occipital lobe, stable. There is evidence of a prior infarct at the gray-white junction of the anterior right parietal lobe, stable. Elsewhere there is patchy small vessel disease in the centra semiovale bilaterally. There is no acute appearing infarct. Basal ganglia calcification bilaterally is a physiologic finding. A small calcification in the mid right cerebellum may represent a small granuloma. This finding was present on prior study. Vascular: There is no hyperdense vessel. There is calcification in the left vertebral artery as  well as in both carotid siphon regions. Skull: Bony calvarium appears intact. There is a right parietal scalp hematoma. Sinuses/Orbits: There is opacification in a posterior ethmoid air cell. There is mucosal thickening in several ethmoid air cells bilaterally. There is inward bowing of each medial orbital wall, a finding that may be congenital or may be due to previous trauma. This finding is stable. No intraorbital lesions are evident. Other: Mastoid air cells are clear. IMPRESSION: 1. Right parietal scalp hematoma with underlying bony calvarium intact. 2. Atrophy with supratentorial small vessel disease. Prior infarcts as noted, largest in the occipital lobes. 3.  No mass or hemorrhage.  No extra-axial fluid collection. 4.  Foci of arterial vascular calcification. 5.  Ethmoid sinus disease noted. Electronically Signed   By: Lowella Grip III M.D.   On: 03/03/2018 11:21   Mr Brain Wo Contrast  Result Date: 02/08/2018  [facility] [address] [place], [place] [address] [phone] NEUROIMAGING REPORT STUDY DATE: 02/08/2018 PATIENT NAME: AYUUB PENLEY DOB: 09/17/1929 MRN: [id number] EXAM: MRI Brain without contrast ORDERING CLINICIAN: Venancio Poisson, NP CLINICAL HISTORY: 84 year old man with history of stroke and neurologic symptoms COMPARISON FILMS: MRI 08/10/2017 TECHNIQUE: MRI of the brain without contrast was obtained utilizing 5 mm axial slices with T1, T2, T2 flair, SWI and diffusion weighted views.  T1 sagittal and T2 coronal views were obtained. CONTRAST: none  IMAGING SITE: [facility], [address] [place], [place] FINDINGS: On sagittal images, the spinal cord is imaged caudally to C4 and is normal in caliber.  There are degenerative changes at C3-C4.  The contents of the posterior fossa are of normal size and position.   The pituitary gland and optic chiasm appear normal.    There is mild to moderate generalized cortical atrophy mild corpus callosal atrophy..   There are no abnormal extra-axial collections of fluid.  The cerebellum and brainstem appears normal.  T1 hyperintense focus posteriorly in the pons on the T1 sagittal images not seen on any of the other sequences and likely representing artifact.  The deep gray matter appears normal.  Remote stroke in the MCA distribution on the left predominantly in the posterior temporal lobe and adjacent parietal lobe. Susceptibility weighted images show some microhemorrhages within the left temporoparietal stroke.   Of note, the stroke was acute on the 08/10/2017 MRI and it has shown expected evolution over time.  There is also a remote right parietal stroke unchanged compared to the previous MRI.  There are scattered T2/FLAIR hypertense foci consistent with chronic microvascular ischemic change.  The current diffusion-weighted images are normal. The VIIth/VIIIth nerve complex appears normal.  There are bilateral lens replacements.  The orbits are otherwise normal.  The mastoid air cells appear normal.  The paranasal sinuses appear normal.  Flow voids are identified within the major intracerebral arteries.     This MRI of the brain without contrast shows the following: 1.    Expected evolution of the temporal parietal stroke that was acute on the 08/10/2017 MRI.   There is a mild amount of chronic microhemorrhage noted 2.    Remote right parietal stroke and chronic microvascular ischemic change. 3.    There are no acute findings. INTERPRETING PHYSICIAN: Richard A. Felecia Shelling, MD, PhD, FAAN Certified in  Neuroimaging by [employer]    Time Spent in minutes  35   Adiyah Lame M.D on 03/04/2018 at 9:26 AM  Between 7am to 7pm - Pager - [phone]  After 7pm go to [email or web address] - password [facility]  [facility] -  Office  [phone]

## 2018-03-05 ENCOUNTER — Inpatient Hospital Stay (HOSPITAL_COMMUNITY): Payer: Medicare Other

## 2018-03-05 ENCOUNTER — Encounter (HOSPITAL_COMMUNITY): Payer: Self-pay | Admitting: Cardiology

## 2018-03-05 DIAGNOSIS — E440 Moderate protein-calorie malnutrition: Secondary | ICD-10-CM

## 2018-03-05 DIAGNOSIS — I5041 Acute combined systolic (congestive) and diastolic (congestive) heart failure: Secondary | ICD-10-CM

## 2018-03-05 DIAGNOSIS — N170 Acute kidney failure with tubular necrosis: Principal | ICD-10-CM

## 2018-03-05 HISTORY — PX: IR PARACENTESIS: IMG2679

## 2018-03-05 LAB — CBC
HCT: 27.8 % — ABNORMAL LOW (ref 39.0–52.0)
HEMOGLOBIN: 8.2 g/dL — AB (ref 13.0–17.0)
MCH: 27.4 pg (ref 26.0–34.0)
MCHC: 29.5 g/dL — AB (ref 30.0–36.0)
MCV: 93 fL (ref 78.0–100.0)
Platelets: 362 10*3/uL (ref 150–400)
RBC: 2.99 MIL/uL — ABNORMAL LOW (ref 4.22–5.81)
RDW: 15.2 % (ref 11.5–15.5)
WBC: 3.8 10*3/uL — ABNORMAL LOW (ref 4.0–10.5)

## 2018-03-05 LAB — BASIC METABOLIC PANEL
Anion gap: 3 — ABNORMAL LOW (ref 5–15)
BUN: 26 mg/dL — AB (ref 8–23)
CO2: 24 mmol/L (ref 22–32)
CREATININE: 2.14 mg/dL — AB (ref 0.61–1.24)
Calcium: 7.4 mg/dL — ABNORMAL LOW (ref 8.9–10.3)
Chloride: 111 mmol/L (ref 98–111)
GFR calc Af Amer: 30 mL/min — ABNORMAL LOW (ref >60)
GFR calc non Af Amer: 26 mL/min — ABNORMAL LOW (ref >60)
Glucose, Bld: 105 mg/dL — ABNORMAL HIGH (ref 70–99)
Potassium: 3.7 mmol/L (ref 3.5–5.1)
SODIUM: 138 mmol/L (ref 135–145)

## 2018-03-05 LAB — GRAM STAIN

## 2018-03-05 LAB — GLUCOSE, CAPILLARY: Glucose-Capillary: 91 mg/dL (ref 70–99)

## 2018-03-05 LAB — TYPE AND SCREEN
ABO/RH(D): AB POS
Antibody Screen: NEGATIVE

## 2018-03-05 LAB — CREATININE, URINE, RANDOM: Creatinine, Urine: 143.09 mg/dL

## 2018-03-05 LAB — ALBUMIN, PLEURAL OR PERITONEAL FLUID: Albumin, Fluid: 1.6 g/dL

## 2018-03-05 LAB — BODY FLUID CELL COUNT WITH DIFFERENTIAL
LYMPHS FL: 52 %
MONOCYTE-MACROPHAGE-SEROUS FLUID: 20 % — AB (ref 50–90)
Neutrophil Count, Fluid: 28 % — ABNORMAL HIGH (ref 0–25)
Total Nucleated Cell Count, Fluid: 360 cu mm (ref 0–1000)

## 2018-03-05 LAB — SODIUM, URINE, RANDOM: Sodium, Ur: 22 mmol/L

## 2018-03-05 LAB — ABO/RH: ABO/RH(D): AB POS

## 2018-03-05 MED ORDER — LIDOCAINE HCL (PF) 2 % IJ SOLN
INTRAMUSCULAR | Status: AC
  Filled 2018-03-05: qty 20

## 2018-03-05 MED ORDER — ENSURE ENLIVE PO LIQD
237.0000 mL | Freq: Three times a day (TID) | ORAL | Status: DC
  Administered 2018-03-05 – 2018-03-12 (×9): 237 mL via ORAL

## 2018-03-05 MED ORDER — ADULT MULTIVITAMIN W/MINERALS CH
1.0000 | ORAL_TABLET | Freq: Every day | ORAL | Status: DC
  Administered 2018-03-05 – 2018-03-10 (×6): 1 via ORAL
  Filled 2018-03-05 (×6): qty 1

## 2018-03-05 MED ORDER — LIDOCAINE HCL (PF) 2 % IJ SOLN
INTRAMUSCULAR | Status: AC | PRN
  Administered 2018-03-05: 10 mL

## 2018-03-05 NOTE — Plan of Care (Signed)
  Problem: Health Behavior/Discharge Planning: Goal: Ability to manage health-related needs will improve Outcome: Progressing   Problem: Clinical Measurements: Goal: Ability to maintain clinical measurements within normal limits will improve Outcome: Progressing Goal: Respiratory complications will improve Outcome: Progressing

## 2018-03-05 NOTE — Clinical Social Work Note (Signed)
Clinical Social Work Assessment  Patient Details  Name: [name] MRN: [id number] Date of Birth: 11-26-1929  Date of referral:  03/05/18               Reason for consult:  Discharge Planning                Permission sought to share information with:  Chartered certified accountant granted to share information::     Name::        Agency::  [facility]  Relationship::     Contact Information:     Housing/Transportation Living arrangements for the past 2 months:  [place] of Information:  Patient, Scientist, water quality, Facility Patient Interpreter Needed:  None Criminal Activity/Legal Involvement Pertinent to Current Situation/Hospitalization:  No - Comment as needed Significant Relationships:  Friend, Other Family Members Lives with:  Facility Resident Do you feel safe going back to the place where you live?  Yes Need for family participation in patient care:  Yes (Comment)  Care giving concerns:  Patient is a long-term resident at [facility].   Social Worker assessment / plan:  CSW met with patient. No supports at bedside. CSW introduced role and explained that discharge planning would be discussed. Patient confirmed he is from [facility] and plans to return at discharge. No further concerns. CSW encouraged patient to contact CSW as needed. CSW will continue to follow patient for support and facilitate discharge back to SNF once medically stable.  Employment status:  Retired Nurse, adult PT Recommendations:  [place] / Referral to community resources:  [place]  Patient/Family's Response to care:  Patient agreeable to return to SNF. Patient's friends and niece supportive and involved in patient's care. Patient appreciated social work intervention.  Patient/Family's Understanding of and Emotional Response to Diagnosis, Current Treatment, and Prognosis:   Patient has a good understanding of the reason for admission and plan to return to SNF at discharge. Patient appears happy with hospital care.  Emotional Assessment Appearance:  Appears stated age Attitude/Demeanor/Rapport:  Engaged, Gracious Affect (typically observed):  Accepting, Appropriate, Calm, Pleasant Orientation:  Oriented to Self, Oriented to Place, Oriented to Situation, Oriented to  Time Alcohol / Substance use:  Never Used Psych involvement (Current and /or in the community):  No (Comment)  Discharge Needs  Concerns to be addressed:  Care Coordination Readmission within the last 30 days:  No Current discharge risk:  None Barriers to Discharge:  Continued Medical Work up   Candie Chroman, LCSW 03/05/2018, 1:03 PM

## 2018-03-05 NOTE — Progress Notes (Signed)
Telemetry called, reports upon review of telemetry she noticed pt has been afib controlled rate in 90's since 1429, pt vss, denies cp sob, paged MD to update

## 2018-03-05 NOTE — Progress Notes (Signed)
Initial Nutrition Assessment  DOCUMENTATION CODES:   Non-severe (moderate) malnutrition in context of chronic illness  INTERVENTION:   -Ensure Enlive po TID, each supplement provides 350 kcal and 20 grams of protein -MVI with minerals daily  NUTRITION DIAGNOSIS:   Moderate Malnutrition related to chronic illness(CVA, dementia) as evidenced by energy intake < or equal to 75% for > or equal to 1 month, mild fat depletion, moderate fat depletion, mild muscle depletion, moderate muscle depletion.  GOAL:   Patient will meet greater than or equal to 90% of their needs  MONITOR:   PO intake, Supplement acceptance, Labs, Weight trends, Skin, I & O's  REASON FOR ASSESSMENT:   Consult Assessment of nutrition requirement/status  ASSESSMENT:   [name] is a 84 y.o. male with medical history significant for HTN, PAD, CVA, dementia, HLD, BPH, who was brought to the ED via EMS this morning after being found down at his facility.  Pt admitted with syncope and collapse.   Reviewed records from [facility]; pt was on a regular diet PTA and also had Ensure BID ordered.   Case discussed with RN who reports pt with distended abdomen. Plan for paracentesis later this afternoon.   Spoke with pt at bedside, who reports poor appetite over the past 3 weeks related to altered taste of foods and nausea. Pt reports that he is typically a very good eater and consumes all of the meals he is served. However, over the past 3 weeks, pt reports consuming very minimal foods (less than 25% of meal- mainly bites and sips)- pt reports "I wonder how I'm even still alive, eating so poorly for this long". He also reports consuming a lot of water and low calorie drink mix over this period. Meal completion variable; PO: 10-100%, however, did not recall what he consumed for breakfast this AM. Pt with very poor dentition, but declined need for mechanically altered diet.   Pt endorses weight loss, however,  unsure of UBW or how much weight he has lost. Reviewed wt hx, which reveals a 3.7% wt loss over the past 6 months, which is not significant for time frame. Suspect fluid status and ascites may be masking true weight loss as well as severity of fat and muscle depletions.   Pt denies any changes in mobility- walks with a cane at baseline and reports he recently "graduated" from therapy services at SNF (has been a resident of SNF over the past 6-7 months per his report).   Discussed with pt importance of good meal and supplement intake to promote healing. Pt amenable to continue Ensure supplements.   Labs reviewed.   NUTRITION - FOCUSED PHYSICAL EXAM:    Most Recent Value  Orbital Region  Moderate depletion  Upper Arm Region  Moderate depletion  Thoracic and Lumbar Region  No depletion  Buccal Region  Mild depletion  Temple Region  Moderate depletion  Clavicle Bone Region  Mild depletion  Clavicle and Acromion Bone Region  Moderate depletion  Scapular Bone Region  Moderate depletion  Dorsal Hand  Moderate depletion  Patellar Region  Mild depletion  Anterior Thigh Region  Mild depletion  Posterior Calf Region  Mild depletion  Edema (RD Assessment)  Mild  Hair  Reviewed  Eyes  Reviewed  Mouth  Reviewed  Skin  Reviewed  Nails  Reviewed       Diet Order:   Diet Order            Diet Heart Room service  appropriate? Yes; Fluid consistency: Thin  Diet effective now              EDUCATION NEEDS:   Education needs have been addressed  Skin:  Skin Assessment: Skin Integrity Issues: Skin Integrity Issues:: Other (Comment) Other: rt posterior head laceration  Last BM:  03/04/18  Height:   Ht Readings from Last 1 Encounters:  03/05/18 5\' 8"  (1.727 m)    Weight:   Wt Readings from Last 1 Encounters:  03/05/18 67.6 kg    Ideal Body Weight:  70 kg  BMI:  Body mass index is 22.66 kg/m.  Estimated Nutritional Needs:   Kcal:  1800-2000  Protein:  90-105  grams  Fluid:  1.8-2.0 L    [name] A. Jimmye Norman, RD, LDN, CDE Pager: [phone] After hours Pager: [phone]

## 2018-03-05 NOTE — Progress Notes (Signed)
[facility] PROGRESS NOTE Subjective Still waiting for paracentesis.  Patient denies knowledge of any bleeding.He states he is not very hungry and is not sure if he is getting a diet or not.  The paracentesis is on the schedule hopefully will be done today.  Objective: Vital signs in last 24 hours: Temp:  [98.1 F (36.7 C)-99.3 F (37.4 C)] 98.1 F (36.7 C) (10/01 0507) Pulse Rate:  [77-87] 79 (10/01 0507) Resp:  [16-20] 19 (10/01 0507) BP: (98-141)/(56-82) 131/77 (10/01 0507) SpO2:  [99 %-100 %] 100 % (10/01 0507) Weight:  [67.6 kg] 67.6 kg (10/01 0507) Last BM Date: 03/04/18  Intake/Output from previous day: 09/30 0701 - 10/01 0700 In: 2385.8 [P.O.:600; I.V.:1785.8] Out: 100 [Urine:100] Intake/Output this shift: Total I/O In: 868.3 [I.V.:868.3] Out: 400 [Urine:400]  PE: General--alert, pleasantly confused  Abdomen--distended, nontender, probable subcutaneous lipoma in the right upper quadrant palpable  Lab Results: Recent Labs    03/03/18 0919 03/04/18 0609 03/05/18 0419  WBC 6.2 4.9 3.8*  HGB 10.7* 9.4* 8.2*  HCT 36.2* 32.0* 27.8*  PLT 379 409* 362   BMET Recent Labs    03/03/18 0919 03/04/18 0609 03/05/18 0419  NA 138 139 138  K 4.9 4.0 3.7  CL 103 106 111  CO2 25 27 24   CREATININE 2.34* 2.23* 2.14*   LFT Recent Labs    03/03/18 0919  PROT 4.9*  AST 19  ALT 9  ALKPHOS 98  BILITOT 1.0   PT/INR No results for input(s): LABPROT, INR in the last 72 hours. PANCREAS Recent Labs    03/03/18 0919  LIPASE 31         Studies/Results: Ct Abdomen Wo Contrast  Result Date: 03/03/2018 CLINICAL DATA:  Abdominal mass. Appears to be an abdominal wall hernia versus rectus sheath hematoma or mass. EXAM: CT ABDOMEN WITHOUT CONTRAST TECHNIQUE: Multidetector CT imaging of the abdomen was performed following the standard protocol without IV contrast. COMPARISON:  February 25, 2016 FINDINGS: Lower chest: Mild atelectasis in the bases.  Small  effusions. Hepatobiliary: Pneumobilia is consistent with previous sphincterotomy, unchanged, in the left hepatic lobe. The patient's known left hepatic mass is not well assessed without contrast on today's study. This has the appearance of a hemangioma on previous imaging. The liver is otherwise unremarkable. The contour of the liver is smooth and not nodular. The patient is status post cholecystectomy. Pancreas: Unremarkable. No pancreatic ductal dilatation or surrounding inflammatory changes. Spleen: Normal in size without focal abnormality. Adrenals/Urinary Tract: Adrenal glands are unremarkable. Kidneys are normal, without renal calculi, focal lesion, or hydronephrosis. Stomach/Bowel: The stomach and small bowel are normal without identified obstruction. The colon is decompressed but does contain contrast. No obvious colonic abnormality. The cecum and appendix, if the patient has 1, are below today's film. Vascular/Lymphatic: Atherosclerotic changes in the nonaneurysmal aorta. Probable shotty and mildly enlarged retroperitoneal nodes, poorly evaluated due to lack of contrast and lack of intra-abdominal fat. An apparent left periaortic node on image 35 measures 15 mm in short axis. Other: Diffuse ascites of uncertain etiology. Lipoma in the right abdominal wall as seen on series 3, image 34. No other abdominal wall mass. Tiny periumbilical hernia. No other hernia is noted. No rectus muscle hematoma identified. Musculoskeletal: No acute or significant osseous findings. IMPRESSION: 1. Diffuse ascites of uncertain etiology. 2. Suggested retroperitoneal adenopathy, poorly evaluated due to lack of contrast and lack of intra-abdominal fat. The nodes are nonspecific but could be reactive or neoplastic. Recommend follow-up after resolution of the  patient's ascites for better evaluation. 3. Tiny pleural effusions and mild bibasilar atelectasis. 4. Atherosclerotic changes in the nonaneurysmal aorta. 5. Right abdominal wall  lipoma. 6. Tiny periumbilical fat containing hernia. Electronically Signed   By: Dorise Bullion III M.D   On: 03/03/2018 18:17   US Renal  Result Date: 03/05/2018 CLINICAL DATA:  Acute tubular necrosis. EXAM: RENAL / URINARY TRACT ULTRASOUND COMPLETE COMPARISON:  CT 2 days ago. FINDINGS: Right Kidney: Length: 11.3 cm. Echogenicity within normal limits. No mass or hydronephrosis visualized. Left Kidney: Length: 12.0 cm. Echogenicity within normal limits. No mass or hydronephrosis visualized. Bladder: Urine is present within the bladder.  No ureteral jet is visualized. There is ascites diffusely distributed. IMPRESSION: Kidneys appear normal by sonography. Normal size and echogenicity. No obstruction. Urine in the bladder. No ureteral jet is identified however. Diffuse ascites. Electronically Signed   By: Nelson Chimes M.D.   On: 03/05/2018 09:27    Medications: I have reviewed the patient's current medications.  Assessment:   1.  Ascites.  CT does suggest increase adenopathy primarily retroperitoneal which could indicate tumor.  He does have a history of alcohol use and could have cirrhosis. 2.  Positive stool with drop in hemoglobin.  Not clear if gross bleeding or delusional.   Plan: We will wait for the results of the paracentesis and see if his BUN and creatinine improved.  He does not appear to be grossly bleeding and I am not sure if that was the reason for his passing out or not or if this was cardiac.  He does have an irregular rhythm so will need to keep watching.   Nancy Fetter 03/05/2018, 11:30 AM  This note was created using voice recognition software. Minor errors may Have occurred unintentionally.  Pager: [phone] If no answer or after hours call [phone]

## 2018-03-05 NOTE — Consult Note (Addendum)
Cardiology Consultation:   Patient ID: [name] MRN: [id number]; DOB: September 09, 1929  Admit date: 03/03/2018 Date of Consult: 03/05/2018  Primary Care Provider: Patient, [name] Primary Cardiologist: Pixie Casino, [name] now with [facility] cardiology in [place]  Primary Electrophysiologist:  None    Patient Profile:   [name] is a 84 y.o. male with a hx of HTN, PAD, CVA, dementia admitted after found down at his facility with hypotension who is being seen today for the evaluation of CHF at the request of [name].  History of Present Illness:   [name] with above hx and with CVA neg event monitor.  He did have an echo in March with EF 60- 65%, G1 DD, mild LVH, LA moderately dilated, mild TR, PA pk pressure 29 mmHg; nuc study 2014 with no ischemia.  Also with nuc study 05/01/17 Essentially normal cardiac perfusion exam. Prognostically this is a low risk scan and EF 67%.  His ACE had been increased for HTN.   Pt now presents 03/03/18 after being found down.  Pt had abd pain and went to [facility] this was his last memory.  Poor appetite.      EKG with a fib and LVH in ER I personally reviewed.   On tele he did convert to SR  EKG 03/04/18 pt was in SR with freq PACs  CXR no edema or consolidation  Aortic prominence and tortuosity likely reflect chronic hypertensive change  CT abd was done for abd mass with diffuse ascites, suggested retroperitoneal adenopathy large abd wall lipoma  Dilated bladder unable to urinate.   Troponin <0.03, CK 47  Hgb 10.7 WBC 6.2, plts 379 Na 138, k+ 4.9, Cr 2.34 CA+ 8.3, albumin 2.3  Iron 20 UIBC 198, TIBC 218  TSH 2.318  GI has seen for guaic + stool, scheduled for paracentesis.  Will need EGD and colonoscopy     Echo done 03/04/18 EF is now 35% with severe hypokinesis of the inf, distal ant and apical walls, hypokinesis of the inferoseptal wall.  Mild LVH. G1 DD, trivial AR, LA was mod dilated PA pk pressure 34 mmHg, + Rt pl effusion.   Trivial MR   Currently back from paracentesis drained 2.8 L.  Pt feels better.  Though still with ascites.  Denies any chest pain and was not aware of increasing girth.  Though he does have dementia and hs trouble with when he saw [name] and had hernia surgery.     Past Medical History:  Diagnosis Date  . BPH (benign prostatic hyperplasia)   . Cataract   . Essential hypertension 05/08/2013  . Exertional dyspnea    with exertion  . Hypertension   . Mitral valvular regurgitation June 2014   Mild to moderate MR on echocardiogram  . PAD (peripheral artery disease) ([place])  December 2014   Lower extremity Dopplers/ABIs: RABI 0.29, LABI 0.66; bilateral external iliacs with significant diameter reduction; R. SFA occlusive disease throughout into popliteal artery with 0 vessel runoff. Anterior tibial reconstitutes distally. 70-99% reduction in all SFA. One-vessel runoff (anterior tibial)  . Stroke ([place])   . Syncope and collapse 03/03/2018    Past Surgical History:  Procedure Laterality Date  . ABDOMINAL ANGIOGRAM  07/07/2013   Procedure: ABDOMINAL ANGIOGRAM;  Surgeon: David Harp, [name];  Location: [facility] CATH LAB;  Service: Cardiovascular;;  . CHOLECYSTECTOMY  07/04/2010  . ERCP N/A 11/27/2012   Procedure: ENDOSCOPIC RETROGRADE CHOLANGIOPANCREATOGRAPHY (ERCP);  Surgeon: David Castle, [name];  Location:  [facility] ENDOSCOPY;  Service: Endoscopy;  Laterality: N/A;  . EYE SURGERY Right   . LEFT HEART CATHETERIZATION WITH CORONARY ANGIOGRAM N/A 07/07/2013   Procedure: LEFT HEART CATHETERIZATION WITH CORONARY ANGIOGRAM;  Surgeon: David Harp, [name];  Location: [facility] C[facility] LAB;  Service: Cardiovascular;  Laterality: N/A;  . LOWER EXTREMITY ANGIOGRAM N/A 07/07/2013   Procedure: LOWER EXTREMITY ANGIOGRAM;  Surgeon: David Harp, [name];  Location: [facility] CATH LAB;  Service: Cardiovascular;  Laterality: N/A;  . NM MYOVIEW LTD  December 2014   Apical thinning but no ischemia. EF 55%  . PROSTATE SURGERY     [name]  .  TRANSTHORACIC ECHOCARDIOGRAM  June 2014   Basal septal thickening with moderate LVH. EF 65%. Normal wall motion. Diastolic dysfunction/NOS. Aortic sclerosis without stenosis. Mild to moderate MR     Home Medications:  Prior to Admission medications   Medication Sig Start Date End Date Taking? Authorizing Provider  acetaminophen (TYLENOL) 325 MG tablet Take 2 tablets (650 mg total) by mouth every 6 (six) hours as needed for mild pain (or Fever >/= 101). 08/14/17  Yes David Milch [name], [name]  aspirin EC 325 MG tablet Take 1 tablet (325 mg total) by mouth daily. 10/30/17  Yes David Hawking, [name]  atorvastatin (LIPITOR) 20 MG tablet Take 20 mg by mouth at bedtime. 09/13/17  Yes Provider, Historical, [name]  Cholecalciferol 1000 units tablet Take 1,000 Units by mouth daily. 12/11/17  Yes Provider, Historical, [name]  lisinopril (PRINIVIL,ZESTRIL) 40 MG tablet Take 1 tablet (40 mg total) by mouth daily. 08/14/17  Yes David Milch [name], [name]  mirtazapine (REMERON) 15 MG tablet Take 7.5 mg by mouth at bedtime.   Yes Provider, Historical, [name]  polyethylene glycol (MIRALAX / GLYCOLAX) packet Take 17 g by mouth daily.   Yes Provider, Historical, [name]  sertraline (ZOLOFT) 50 MG tablet Take 1 tablet (50 mg total) by mouth daily. 10/30/17  Yes David Hawking, [name]    Inpatient Medications: Scheduled Meds: . atorvastatin  20 mg Oral QHS  . cholecalciferol  1,000 Units Oral Daily  . feeding supplement (ENSURE ENLIVE)  237 mL Oral TID BM  . mirtazapine  7.5 mg Oral QHS  . multivitamin with minerals  1 tablet Oral Daily  . pantoprazole  40 mg Oral BID  . polyethylene glycol  17 g Oral Daily  . senna-docusate  2 tablet Oral BID  . sertraline  50 mg Oral Daily  . sodium chloride flush  3 mL Intravenous Q12H   Continuous Infusions:  PRN Meds: acetaminophen, lidocaine, ondansetron **OR** ondansetron (ZOFRAN) IV, senna-docusate  Allergies:   No Known Allergies  Social History:   Social History   Socioeconomic History  . Marital  status: Single    Spouse name: Not on file  . Number of children: Not on file  . Years of education: Not on file  . Highest education level: Not on file  Occupational History  . Occupation: Retired    Fish farm manager: RETIRED  Social Needs  . Financial resource strain: Not hard at all  . Food insecurity:    Worry: Never true    Inability: Never true  . Transportation needs:    Medical: No    Non-medical: No  Tobacco Use  . Smoking status: Former Smoker    Types: Cigarettes    Last attempt to quit: 11/25/1980    Years since quitting: 37.2  . Smokeless tobacco: Never Used  Substance and Sexual Activity  . Alcohol use: Not Currently  Alcohol/week: 4.0 standard drinks    Types: 4 Standard drinks or equivalent per week    Comment: beers  . Drug use: No  . Sexual activity: Not Currently  Lifestyle  . Physical activity:    Days per week: 0 days    Minutes per session: 0 min  . Stress: Not at all  Relationships  . Social connections:    Talks on phone: Once a week    Gets together: Once a week    Attends religious service: Never    Active member of club or organization: No    Attends meetings of clubs or organizations: Never    Relationship status: Never married  . Intimate partner violence:    Fear of current or ex partner: No    Emotionally abused: No    Physically abused: No    Forced sexual activity: No  Other Topics Concern  . Not on file  Social History Narrative   He is originally from [place]. He has a history of long-standing tobacco use but quit several years ago. He also has a history of former alcohol abuse as well.   He is retired and currently single.    Family History:    Family History  Problem Relation Age of Onset  . Diabetes Sister   . Cancer Sister   . Cancer Mother   . Cancer Father   . Cancer Sister   . Heart disease Brother      ROS:  Please see the history of present illness.  General:no colds or fevers, no weight changes Skin:no rashes  or ulcers HEENT:no blurred vision, no congestion CV:see HPI PUL:see HPI GI:no diarrhea constipation or melena, no indigestion GU:no hematuria, no dysuria MS:no joint pain, no claudication Neuro:no syncope, no lightheadedness Endo:no diabetes, no thyroid disease Not sure how accurate his memory is with negatives. All other ROS reviewed and negative.     Physical Exam/Data:   Vitals:   03/04/18 1433 03/04/18 1957 03/05/18 0027 03/05/18 0507  BP: 110/68 (!) 98/56 102/63 131/77  Pulse: 81 82 77 79  Resp: 18 19 20 19   Temp: 98.4 F (36.9 C) 99.3 F (37.4 C) 98.1 F (36.7 C) 98.1 F (36.7 C)  TempSrc: Oral Oral Oral Oral  SpO2: 100% 99% 99% 100%  Weight:    67.6 kg  Height:    5\' 8"  (1.727 m)    Intake/Output Summary (Last 24 hours) at 03/05/2018 1434 Last data filed at 03/05/2018 1047 Gross per 24 hour  Intake 2055.3 ml  Output 500 ml  Net 1555.3 ml   Filed Weights   03/04/18 0515 03/05/18 0507  Weight: 71.5 kg 67.6 kg   Body mass index is 22.66 kg/m.  General:  Well nourished, well developed, in no acute distress resting in bed HEENT: normal, staples on rt lateral scalp Lymph: no adenopathy Neck: + JVD Endocrine:  No thryomegaly Vascular: No carotid bruits; pedal pulses 2+ bilaterally  Cardiac:  normal S1, S2; RRR; soft murmur 1-2/6, no gallup rub or click Lungs:  clear to auscultation bilaterally, no wheezing, rhonchi + rales in bases and diminished Abd: soft, nontender, no hepatomegaly + ascites Ext: no edema, SCD stockings in place  Musculoskeletal:  No deformities, BUE and BLE strength normal and equal Skin: warm and dry  Neuro:  Oriented to person and place, knew how much fluid was removed, no focal abnormalities noted Psych:  Normal affect   Relevant CV Studies: Echo 03/04/18 Study Conclusions  - Left ventricle: Difficult  study Frequent ectopy. LVEF is   approximately 35% with severe hypokinesis of the inferior, distal   anterior and apical walls;  hypokinesis of the inferoseptal wall.   The cavity size was normal. Wall thickness was increased in a   pattern of mild LVH. Doppler parameters are consistent with   abnormal left ventricular relaxation (grade 1 diastolic   dysfunction). - Aortic valve: There was trivial regurgitation. Valve area (Vmax):   2.59 cm^2. - Left atrium: The atrium was moderately dilated. - Pulmonary arteries: PA peak pressure: 34 mm Hg (S). - Pericardium, extracardiac: There was a right pleural effusion.   Laboratory Data:  Chemistry Recent Labs  Lab 03/03/18 0919 03/04/18 0609 03/05/18 0419  NA 138 139 138  K 4.9 4.0 3.7  CL 103 106 111  CO2 25 27 24   GLUCOSE 97 111* 105*  BUN 30* 29* 26*  CREATININE 2.34* 2.23* 2.14*  CALCIUM 8.3* 8.2* 7.4*  GFRNONAA 24* 25* 26*  GFRAA 27* 29* 30*  ANIONGAP 10 6 3*    Recent Labs  Lab 03/03/18 0919  PROT 4.9*  ALBUMIN 2.3*  AST 19  ALT 9  ALKPHOS 98  BILITOT 1.0   Hematology Recent Labs  Lab 03/03/18 0919 03/04/18 0609 03/05/18 0419  WBC 6.2 4.9 3.8*  RBC 3.85* 3.43* 2.99*  HGB 10.7* 9.4* 8.2*  HCT 36.2* 32.0* 27.8*  MCV 94.0 93.3 93.0  MCH 27.8 27.4 27.4  MCHC 29.6* 29.4* 29.5*  RDW 15.0 15.2 15.2  PLT 379 409* 362   Cardiac Enzymes Recent Labs  Lab 03/03/18 1416 03/03/18 2022  TROPONINI <0.03 <0.03    Recent Labs  Lab 03/03/18 1009  TROPIPOC 0.00    BNPNo results for input(s): BNP, PROBNP in the last 168 hours.  DDimer No results for input(s): DDIMER in the last 168 hours.  Radiology/Studies:  Ct Abdomen Wo Contrast  Result Date: 03/03/2018 CLINICAL DATA:  Abdominal mass. Appears to be an abdominal wall hernia versus rectus sheath hematoma or mass. EXAM: CT ABDOMEN WITHOUT CONTRAST TECHNIQUE: Multidetector CT imaging of the abdomen was performed following the standard protocol without IV contrast. COMPARISON:  February 25, 2016 FINDINGS: Lower chest: Mild atelectasis in the bases.  Small effusions. Hepatobiliary:  Pneumobilia is consistent with previous sphincterotomy, unchanged, in the left hepatic lobe. The patient's known left hepatic mass is not well assessed without contrast on today's study. This has the appearance of a hemangioma on previous imaging. The liver is otherwise unremarkable. The contour of the liver is smooth and not nodular. The patient is status post cholecystectomy. Pancreas: Unremarkable. No pancreatic ductal dilatation or surrounding inflammatory changes. Spleen: Normal in size without focal abnormality. Adrenals/Urinary Tract: Adrenal glands are unremarkable. Kidneys are normal, without renal calculi, focal lesion, or hydronephrosis. Stomach/Bowel: The stomach and small bowel are normal without identified obstruction. The colon is decompressed but does contain contrast. No obvious colonic abnormality. The cecum and appendix, if the patient has 1, are below today's film. Vascular/Lymphatic: Atherosclerotic changes in the nonaneurysmal aorta. Probable shotty and mildly enlarged retroperitoneal nodes, poorly evaluated due to lack of contrast and lack of intra-abdominal fat. An apparent left periaortic node on image 35 measures 15 mm in short axis. Other: Diffuse ascites of uncertain etiology. Lipoma in the right abdominal wall as seen on series 3, image 34. No other abdominal wall mass. Tiny periumbilical hernia. No other hernia is noted. No rectus muscle hematoma identified. Musculoskeletal: No acute or significant osseous findings. IMPRESSION: 1. Diffuse ascites of uncertain  etiology. 2. Suggested retroperitoneal adenopathy, poorly evaluated due to lack of contrast and lack of intra-abdominal fat. The nodes are nonspecific but could be reactive or neoplastic. Recommend follow-up after resolution of the patient's ascites for better evaluation. 3. Tiny pleural effusions and mild bibasilar atelectasis. 4. Atherosclerotic changes in the nonaneurysmal aorta. 5. Right abdominal wall lipoma. 6. Tiny  periumbilical fat containing hernia. Electronically Signed   By: Dorise Bullion III M.[name]   On: 03/03/2018 18:17   Dg Chest 2 View  Result Date: 03/03/2018 CLINICAL DATA:  Unwitnessed fall.  Hypertension. EXAM: CHEST - 2 VIEW COMPARISON:  August 09, 2017 FINDINGS: There is atelectatic change in the right mid lower lung zones. There is no edema or consolidation. Heart is borderline enlarged with pulmonary vascularity normal. Aorta is prominent and rather tortuous, stable. There are metallic fragments in the left hemithorax. No pneumothorax. No bone lesions. IMPRESSION: No edema or consolidation. Areas of mild atelectatic change on the right. Stable cardiac silhouette. Aortic prominence and tortuosity likely reflect chronic hypertensive change. Electronically Signed   By: Lowella Grip III M.[name].   On: 03/03/2018 11:44   Ct Head Wo Contrast  Result Date: 03/03/2018 CLINICAL DATA:  Unwitnessed fall EXAM: CT HEAD WITHOUT CONTRAST TECHNIQUE: Contiguous axial images were obtained from the base of the skull through the vertex without intravenous contrast. COMPARISON:  Head CT August 09, 2017 and brain MRI February 08, 2018 FINDINGS: Brain: There is moderate diffuse atrophy. There is no intracranial mass, hemorrhage, extra-axial fluid collection, or midline shift. There is evidence of a prior infarct involving much of the left occipital lobe, stable. There is evidence of a prior infarct in the mid right occipital lobe, stable. There is evidence of a prior infarct at the gray-white junction of the anterior right parietal lobe, stable. Elsewhere there is patchy small vessel disease in the centra semiovale bilaterally. There is no acute appearing infarct. Basal ganglia calcification bilaterally is a physiologic finding. A small calcification in the mid right cerebellum may represent a small granuloma. This finding was present on prior study. Vascular: There is no hyperdense vessel. There is calcification in the left  vertebral artery as well as in both carotid siphon regions. Skull: Bony calvarium appears intact. There is a right parietal scalp hematoma. Sinuses/Orbits: There is opacification in a posterior ethmoid air cell. There is mucosal thickening in several ethmoid air cells bilaterally. There is inward bowing of each medial orbital wall, a finding that may be congenital or may be due to previous trauma. This finding is stable. No intraorbital lesions are evident. Other: Mastoid air cells are clear. IMPRESSION: 1. Right parietal scalp hematoma with underlying bony calvarium intact. 2. Atrophy with supratentorial small vessel disease. Prior infarcts as noted, largest in the occipital lobes. 3.  No mass or hemorrhage.  No extra-axial fluid collection. 4.  Foci of arterial vascular calcification. 5.  Ethmoid sinus disease noted. Electronically Signed   By: Lowella Grip III M.[name].   On: 03/03/2018 11:21   US Renal  Result Date: 03/05/2018 CLINICAL DATA:  Acute tubular necrosis. EXAM: RENAL / URINARY TRACT ULTRASOUND COMPLETE COMPARISON:  CT 2 days ago. FINDINGS: Right Kidney: Length: 11.3 cm. Echogenicity within normal limits. No mass or hydronephrosis visualized. Left Kidney: Length: 12.0 cm. Echogenicity within normal limits. No mass or hydronephrosis visualized. Bladder: Urine is present within the bladder.  No ureteral jet is visualized. There is ascites diffusely distributed. IMPRESSION: Kidneys appear normal by sonography. Normal size and echogenicity. No obstruction.  Urine in the bladder. No ureteral jet is identified however. Diffuse ascites. Electronically Signed   By: Nelson Chimes M.[name].   On: 03/05/2018 09:27    Assessment and Plan:   Syncope  Most likely to hypotension with decreased appetite vs a fib --pt with no recollection of what happened.   PAF converted yesterday - prior hx CVA and no a fib on home mnoitor, previous SR  with freq PACS and now the same  CHA2DS2VASC is 7 - would place on  anticoagulation but may need results of other issues with ascites, and CKD and with syncope of possible hypotension.   [name] to see.  Acute systolic HF with new drop in EF, ? Due to ischemia vs a fib , nuc study to further evaluate- defer to [name]  Ascites unknown etiology   2.8 L of fluid removed today.   AKI with Cr 2.14 today  Down from 2.3 on admit and normal 0.79 06/15/17 and 0.6 08/16/17   Anemia with continued decrease in H/H now hgb 8.2  Stools heme + --new in last 6 months  Rt scalp hematoma Atrophy with supratentorial small vessel disease. Prior infarcts as noted, largest in the occipital lobes.  On CT of head      For questions or updates, please contact [place] Please consult [email or web address] for contact info under     Signed, David Kicks, [name]  03/05/2018 2:34 PM  ---------------------------------------------------------------   History and all data above reviewed.  Patient examined.  I agree with the findings as above.  [name]  is a 84 y.o. male with a hx of HTN, PAD, CVA, dementia admitted after found down at his facility with hypotension who is being seen today for the evaluation of CHF at the request of [name].  At the time of [name] CVA earlier this year, he underwent an event monitor evaluating for atrial fibrillation which was unrevealing per documentation.  His echocardiogram demonstrated a preserved ejection fraction with no significant valvular heart disease.  He also had a nuclear stress test in 2014 and November 2018 with no inducible ischemia.  On September 29 he was admitted after being found down at his nursing facility, which he has no memory of the event.  He recalls having an upset stomach but does not recall the events that led up to his admission to the hospital.  Reportedly he was hypotensive on presentation with systolic blood pressures in the 80 mmHg range.  Since admission he has intermittently been in atrial  fibrillation which per medical record has not previously been documented.  He has a documented positive Hemoccult stool and has had a 2 g drop in his hemoglobin.  Just prior to cardiology consultation he underwent paracentesis with removal of 2.8 L of fluid, cytology and cultures pending  On our conversation, he reports feeling unwell, specifically tired with poor p.o. intake over the past 3 to 4 weeks.  Constitutional: No acute distress Eyes: pupils equally round and reactive to light, sclera non-icteric, normal conjunctiva and lids ENMT: poor dentition, moist mucous membranes Cardiovascular: regular rhythm (currently in sinus), normal rate, no murmurs. S1 and S2 normal. No jugular venous distention.  Respiratory: clear to auscultation bilaterally GI : normal bowel sounds, soft and nontender. Mildly distended.   MSK: extremities warm, well perfused. Trace edema.  NEURO: grossly nonfocal exam, moves all extremities. PSYCH: alert and oriented x 3, normal mood and affect.   All available labs, radiology testing, previous  records reviewed. Agree with documented assessment and plan of my colleague as stated above with the following additions or changes:  Principal Problem:   Syncope and collapse Active Problems:   Valvular heart disease   History of BPH   Right upper quadrant abdominal mass   Essential hypertension   PAD (peripheral artery disease) (HCC)   Dyslipidemia   Protein-calorie malnutrition (HCC)   Vascular dementia without behavioral disturbance (HCC)   Constipation   AKI (acute kidney injury) ([place])   Anemia   Malnutrition of moderate degree  Mr. Zwahlen is a very pleasant 84 year old gentleman who presents after being found down, with a newly reduced ejection fraction of 35% and newly documented atrial fibrillation.  I am suspicious that he may have had atrial fibrillation in the past with a moderately dilated left atrium on echo and a previous CVA.  His presentation does not  currently suggest ACS with a unremarkable troponin.  It is possible that his reduced ejection fraction is secondary to acute medical illness, and it may be reasonable to recheck an echocardiogram just after hospital dismissal to evaluate for any improvement.  It is also possible that this is secondary to a tachycardia mediated cardiomyopathy from previously undetected atrial fibrillation with rapid ventricular response.  Currently he is in sinus but perhaps is going in and out of paroxysmal atrial fibrillation.   His chads 2 vas score is 7 for age, hypertension, heart failure, previous CVA, and peripheral vascular disease.  However in the setting of anemia with a Hemoccult positive stool, anticoagulation should be carefully considered and perhaps we can hold on this until the source of the anemia is further clarified.  I would like to see how his kidney function improves after paracentesis as he is not showing overt signs of heart failure otherwise.  Lasix can be held at the current time, overnight urine output and renal function tomorrow will help to determine whether we should attempt a Lasix challenge her renal function versus search for alternate causes of ascites, and decline in renal function.  For both systolic function and rate control in atrial fibrillation, a small dose of a beta-blocker can be initiated, I would suggest trialing metoprolol tartrate 12.5 mg tomorrow morning, and observing him throughout the day prior to re-dosing.  If he tolerates it well this can be initiated in twice daily dosing form.  Other heart failure therapies to be held currently in the setting of acute renal dysfunction.  Cardiology will follow along. Plan:   Elouise Munroe, [name] 6:23 AM  03/06/2018

## 2018-03-05 NOTE — Procedures (Signed)
Ultrasound-guided diagnostic and therapeutic paracentesis performed yielding 2.8 liters of yellow fluid. No immediate complications. A portion of the fluid was sent to the lab for preordered studies.

## 2018-03-05 NOTE — Progress Notes (Signed)
Pt to IR for paracentesis in bed in stable condition, POA at bedside, all questions entertained and answered

## 2018-03-05 NOTE — Progress Notes (Addendum)
PROGRESS NOTE                                                                                                                                                                                                             Patient Demographics:    [name], is a 84 y.o. male, DOB - 1929-08-13, GEZ:[id number]  Admit date - 03/03/2018   Admitting Physician Janora Norlander, MD  Outpatient Primary MD for the patient is Patient, [name]  LOS - 2  Outpatient Specialists: none  Chief Complaint  Patient presents with  . Fall       Brief Narrative  84 year old male with history of hypertension, CVA (left inferior MCA infarct in March this year), mild dementia, hyperlipidemia, PAD, BPH resident of [facility] brought to ED by EMS after found on the floor of his facility.  Patient says he may have slipped on something but does not recall and thinks he passed out.  Patient complained of some abdominal discomfort and was trying to strain in the bathroom when he passed out.  Reportedly his systolic blood pressure was in the 80s when he was found. Patient reports being constipated frequently for the past 3 weeks associated with poor p.o. intake, significant weight loss and right-sided abdominal pain with occasional vomiting.  No fevers or chills. In the ED vitals were stable.  EKG showed irregular rhythm with P waves and dropped QRS complexes.  Noted for 450 cc urine in his bladder scan and was unable to urinate.  Blood work showed 2 g drop in H&H with positive Hemoccult, acute kidney injury.  Head CT was negative for acute findings. Admitted for further management.   Subjective:   Patient reports feeling tired.  He seems more oriented today.  Has more abdominal distention.   Assessment  & Plan :    Principal Problem:   Syncope and collapse Associated with dehydration versus arrhythmia with nonischemic cardiomyopathy.  Serial  troponins negative.  Blood pressure currently stable.  Discontinue IV fluids as patient showing signs of fluid overload clinically.  Active Problems:  AKI (acute kidney injury) ([place]) Suspect prerenal ATN due to dehydration versus cardiorenal syndrome.. Patient's baseline creatinine is around 0.6 with still over 3 times increase in the creatinine level.  Avoid nephrotoxins.  Holding ACE inhibitor.  Strict I's/O and daily  weight.  Renal ultrasound without any findings of medical renal disease or obstruction.  Follow urine lites.  Abdominal ascites Etiology unclear.  History of liver hemangioma which could not be appreciated well due to lack of IV contrast on CT abdomen.  Ordered paracentesis and will follow with peritoneal fluid cell count, culture and cytology.  LFTs have been normal.  Acute combined systolic and diastolic CHF/?  Nonischemic cardio myopathy New finding.  2D echo in March this year showed normal EF.  Holding aspirin for concern of GI bleed.  Continue statin.  Holding ACE inhibitor.  Has mild fluid overload on exam.  Discontinue IV fluids. Cardiology consulted.   Iron deficiency anemia/?  Acute blood loss anemia. As almost 4 g drop in hemoglobin from baseline.  Found to be iron deficient.  Hemoccult positive.  GI consult appreciated who recommends PPI with H&H monitoring and no further intervention at present.  Hemoglobin further worsened to 8.2 this morning.  Type and screen.  Placed on SCDs.    Protein calorie malnutrition, severe Nutrition consulted.  Patient reports very poor p.o. intake and significant weight loss.    History of CVA Left inferior MCA infarct in March 2019.  Continue  Lipitor, hold aspirin with concern for GI bleed..  Recent follow-up MRI of the brain done 3 weeks back shows expected evolution of the stroke.     Generalized weakness PT evaluation.  Reports using both a walker and cane at the facility.  Constipation Continue senna-Colace and  MiraLAX.  Code Status : DNR  Family Communication  : Discussed with patient's wife on the phone.  Disposition Plan  : Return to SNF once improved  Barriers For Discharge : Active symptoms  Consults  : GI [place] [facility]  Procedures  : CT head, CT abdomen without contrast, renal ultrasound, pending paracentesis.  DVT Prophylaxis  : Subcu heparin Lab Results  Component Value Date   PLT 362 03/05/2018    Antibiotics  :    Anti-infectives (From admission, onward)   None        Objective:   Vitals:   03/04/18 1433 03/04/18 1957 03/05/18 0027 03/05/18 0507  BP: 110/68 (!) 98/56 102/63 131/77  Pulse: 81 82 77 79  Resp: 18 19 20 19   Temp: 98.4 F (36.9 C) 99.3 F (37.4 C) 98.1 F (36.7 C) 98.1 F (36.7 C)  TempSrc: Oral Oral Oral Oral  SpO2: 100% 99% 99% 100%  Weight:    67.6 kg  Height:    5\' 8"  (1.727 m)    Wt Readings from Last 3 Encounters:  03/05/18 67.6 kg  01/30/18 72.6 kg  01/07/18 72.6 kg     Intake/Output Summary (Last 24 hours) at 03/05/2018 1046 Last data filed at 03/05/2018 0854 Gross per 24 hour  Intake 3134.12 ml  Output 100 ml  Net 3034.12 ml    Discal exam Elderly male appears fatigued, not in distress HEENT: Pallor present, temporal wasting, moist mucosa, supple neck, no JVD Chest: Fine bibasilar crackles, no added sounds CVS: Normal S1-S2, no murmurs rub or gallop GI: Abdominal distention with ascites, nontender, bowel sounds present Musculoskeletal: Warm, trace edema CNS: Alert and oriented, nonfocal    Data Review:    CBC Recent Labs  Lab 03/03/18 0919 03/04/18 0609 03/05/18 0419  WBC 6.2 4.9 3.8*  HGB 10.7* 9.4* 8.2*  HCT 36.2* 32.0* 27.8*  PLT 379 409* 362  MCV 94.0 93.3 93.0  MCH 27.8 27.4 27.4  MCHC 29.6* 29.4* 29.5*  RDW 15.0 15.2 15.2  LYMPHSABS 1.1  --   --   MONOABS 0.4  --   --   EOSABS 0.1  --   --   BASOSABS 0.0  --   --     Chemistries  Recent Labs  Lab 03/03/18 0919 03/04/18 0609 03/05/18 0419   NA 138 139 138  K 4.9 4.0 3.7  CL 103 106 111  CO2 25 27 24   GLUCOSE 97 111* 105*  BUN 30* 29* 26*  CREATININE 2.34* 2.23* 2.14*  CALCIUM 8.3* 8.2* 7.4*  MG 1.8  --   --   AST 19  --   --   ALT 9  --   --   ALKPHOS 98  --   --   BILITOT 1.0  --   --    ------------------------------------------------------------------------------------------------------------------ No results for input(s): CHOL, HDL, LDLCALC, TRIG, CHOLHDL, LDLDIRECT in the last 72 hours.  Lab Results  Component Value Date   HGBA1C 5.2 08/10/2017   ------------------------------------------------------------------------------------------------------------------ Recent Labs    03/03/18 1416  TSH 2.318   ------------------------------------------------------------------------------------------------------------------ Recent Labs    03/03/18 1416  TIBC 218*  IRON 20*    Coagulation profile No results for input(s): INR, PROTIME in the last 168 hours.  No results for input(s): DDIMER in the last 72 hours.  Cardiac Enzymes Recent Labs  Lab 03/03/18 1416 03/03/18 2022  TROPONINI <0.03 <0.03   ------------------------------------------------------------------------------------------------------------------ No results found for: BNP  Inpatient Medications  Scheduled Meds: . atorvastatin  20 mg Oral QHS  . cholecalciferol  1,000 Units Oral Daily  . mirtazapine  7.5 mg Oral QHS  . pantoprazole  40 mg Oral BID  . polyethylene glycol  17 g Oral Daily  . senna-docusate  2 tablet Oral BID  . sertraline  50 mg Oral Daily  . sodium chloride flush  3 mL Intravenous Q12H   Continuous Infusions:  PRN Meds:.acetaminophen, ondansetron **OR** ondansetron (ZOFRAN) IV, senna-docusate  Micro Results Recent Results (from the past 240 hour(s))  MRSA PCR Screening     Status: None   Collection Time: 03/03/18  3:20 PM  Result Value Ref Range Status   MRSA by PCR NEGATIVE NEGATIVE Final    Comment:         The GeneXpert MRSA Assay (FDA approved for NASAL specimens only), is one component of a comprehensive MRSA colonization surveillance program. It is not intended to diagnose MRSA infection nor to guide or monitor treatment for MRSA infections. Performed at [facility], [place] [address], [place], [place] [address]     Radiology Reports Ct Abdomen Wo Contrast  Result Date: 03/03/2018 CLINICAL DATA:  Abdominal mass. Appears to be an abdominal wall hernia versus rectus sheath hematoma or mass. EXAM: CT ABDOMEN WITHOUT CONTRAST TECHNIQUE: Multidetector CT imaging of the abdomen was performed following the standard protocol without IV contrast. COMPARISON:  February 25, 2016 FINDINGS: Lower chest: Mild atelectasis in the bases.  Small effusions. Hepatobiliary: Pneumobilia is consistent with previous sphincterotomy, unchanged, in the left hepatic lobe. The patient's known left hepatic mass is not well assessed without contrast on today's study. This has the appearance of a hemangioma on previous imaging. The liver is otherwise unremarkable. The contour of the liver is smooth and not nodular. The patient is status post cholecystectomy. Pancreas: Unremarkable. No pancreatic ductal dilatation or surrounding inflammatory changes. Spleen: Normal in size without focal abnormality. Adrenals/Urinary Tract: Adrenal glands are unremarkable. Kidneys are normal, without renal calculi, focal lesion, or hydronephrosis.  Stomach/Bowel: The stomach and small bowel are normal without identified obstruction. The colon is decompressed but does contain contrast. No obvious colonic abnormality. The cecum and appendix, if the patient has 1, are below today's film. Vascular/Lymphatic: Atherosclerotic changes in the nonaneurysmal aorta. Probable shotty and mildly enlarged retroperitoneal nodes, poorly evaluated due to lack of contrast and lack of intra-abdominal fat. An apparent left periaortic node on image 35 measures 15  mm in short axis. Other: Diffuse ascites of uncertain etiology. Lipoma in the right abdominal wall as seen on series 3, image 34. No other abdominal wall mass. Tiny periumbilical hernia. No other hernia is noted. No rectus muscle hematoma identified. Musculoskeletal: No acute or significant osseous findings. IMPRESSION: 1. Diffuse ascites of uncertain etiology. 2. Suggested retroperitoneal adenopathy, poorly evaluated due to lack of contrast and lack of intra-abdominal fat. The nodes are nonspecific but could be reactive or neoplastic. Recommend follow-up after resolution of the patient's ascites for better evaluation. 3. Tiny pleural effusions and mild bibasilar atelectasis. 4. Atherosclerotic changes in the nonaneurysmal aorta. 5. Right abdominal wall lipoma. 6. Tiny periumbilical fat containing hernia. Electronically Signed   By: Dorise Bullion III M.D   On: 03/03/2018 18:17   Dg Chest 2 View  Result Date: 03/03/2018 CLINICAL DATA:  Unwitnessed fall.  Hypertension. EXAM: CHEST - 2 VIEW COMPARISON:  August 09, 2017 FINDINGS: There is atelectatic change in the right mid lower lung zones. There is no edema or consolidation. Heart is borderline enlarged with pulmonary vascularity normal. Aorta is prominent and rather tortuous, stable. There are metallic fragments in the left hemithorax. No pneumothorax. No bone lesions. IMPRESSION: No edema or consolidation. Areas of mild atelectatic change on the right. Stable cardiac silhouette. Aortic prominence and tortuosity likely reflect chronic hypertensive change. Electronically Signed   By: Lowella Grip III M.D.   On: 03/03/2018 11:44   Ct Head Wo Contrast  Result Date: 03/03/2018 CLINICAL DATA:  Unwitnessed fall EXAM: CT HEAD WITHOUT CONTRAST TECHNIQUE: Contiguous axial images were obtained from the base of the skull through the vertex without intravenous contrast. COMPARISON:  Head CT August 09, 2017 and brain MRI February 08, 2018 FINDINGS: Brain: There is  moderate diffuse atrophy. There is no intracranial mass, hemorrhage, extra-axial fluid collection, or midline shift. There is evidence of a prior infarct involving much of the left occipital lobe, stable. There is evidence of a prior infarct in the mid right occipital lobe, stable. There is evidence of a prior infarct at the gray-white junction of the anterior right parietal lobe, stable. Elsewhere there is patchy small vessel disease in the centra semiovale bilaterally. There is no acute appearing infarct. Basal ganglia calcification bilaterally is a physiologic finding. A small calcification in the mid right cerebellum may represent a small granuloma. This finding was present on prior study. Vascular: There is no hyperdense vessel. There is calcification in the left vertebral artery as well as in both carotid siphon regions. Skull: Bony calvarium appears intact. There is a right parietal scalp hematoma. Sinuses/Orbits: There is opacification in a posterior ethmoid air cell. There is mucosal thickening in several ethmoid air cells bilaterally. There is inward bowing of each medial orbital wall, a finding that may be congenital or may be due to previous trauma. This finding is stable. No intraorbital lesions are evident. Other: Mastoid air cells are clear. IMPRESSION: 1. Right parietal scalp hematoma with underlying bony calvarium intact. 2. Atrophy with supratentorial small vessel disease. Prior infarcts as noted, largest in the occipital  lobes. 3.  No mass or hemorrhage.  No extra-axial fluid collection. 4.  Foci of arterial vascular calcification. 5.  Ethmoid sinus disease noted. Electronically Signed   By: Lowella Grip III M.D.   On: 03/03/2018 11:21   Mr Brain Wo Contrast  Result Date: 02/08/2018  [facility] [address] [place], [place] [address] [phone] NEUROIMAGING REPORT STUDY DATE: 02/08/2018 PATIENT NAME: SANDOR ARBOLEDA DOB: 30-Jun-1929 MRN: [id number] EXAM: MRI Brain  without contrast ORDERING CLINICIAN: Venancio Poisson, NP CLINICAL HISTORY: 84 year old man with history of stroke and neurologic symptoms COMPARISON FILMS: MRI 08/10/2017 TECHNIQUE: MRI of the brain without contrast was obtained utilizing 5 mm axial slices with T1, T2, T2 flair, SWI and diffusion weighted views.  T1 sagittal and T2 coronal views were obtained. CONTRAST: none IMAGING SITE: [facility], [place], [place] FINDINGS: On sagittal images, the spinal cord is imaged caudally to C4 and is normal in caliber.  There are degenerative changes at C3-C4.  The contents of the posterior fossa are of normal size and position.   The pituitary gland and optic chiasm appear normal.    There is mild to moderate generalized cortical atrophy mild corpus callosal atrophy..  There are no abnormal extra-axial collections of fluid.  The cerebellum and brainstem appears normal.  T1 hyperintense focus posteriorly in the pons on the T1 sagittal images not seen on any of the other sequences and likely representing artifact.  The deep gray matter appears normal.  Remote stroke in the MCA distribution on the left predominantly in the posterior temporal lobe and adjacent parietal lobe. Susceptibility weighted images show some microhemorrhages within the left temporoparietal stroke.   Of note, the stroke was acute on the 08/10/2017 MRI and it has shown expected evolution over time.  There is also a remote right parietal stroke unchanged compared to the previous MRI.  There are scattered T2/FLAIR hypertense foci consistent with chronic microvascular ischemic change.  The current diffusion-weighted images are normal. The VIIth/VIIIth nerve complex appears normal.  There are bilateral lens replacements.  The orbits are otherwise normal.  The mastoid air cells appear normal.  The paranasal sinuses appear normal.  Flow voids are identified within the major intracerebral arteries.     This MRI of the brain without  contrast shows the following: 1.    Expected evolution of the temporal parietal stroke that was acute on the 08/10/2017 MRI.   There is a mild amount of chronic microhemorrhage noted 2.    Remote right parietal stroke and chronic microvascular ischemic change. 3.    There are no acute findings. INTERPRETING PHYSICIAN: Richard A. Felecia Shelling, MD, PhD, FAAN Certified in  Neuroimaging by [employer]   US Renal  Result Date: 03/05/2018 CLINICAL DATA:  Acute tubular necrosis. EXAM: RENAL / URINARY TRACT ULTRASOUND COMPLETE COMPARISON:  CT 2 days ago. FINDINGS: Right Kidney: Length: 11.3 cm. Echogenicity within normal limits. No mass or hydronephrosis visualized. Left Kidney: Length: 12.0 cm. Echogenicity within normal limits. No mass or hydronephrosis visualized. Bladder: Urine is present within the bladder.  No ureteral jet is visualized. There is ascites diffusely distributed. IMPRESSION: Kidneys appear normal by sonography. Normal size and echogenicity. No obstruction. Urine in the bladder. No ureteral jet is identified however. Diffuse ascites. Electronically Signed   By: Nelson Chimes M.D.   On: 03/05/2018 09:27    Time Spent in minutes  35   Jerman Tinnon M.D on 03/05/2018 at 10:46 AM  Between  7am to 7pm - Pager - [phone]  After 7pm go to [email or web address] - password [facility]  [facility] -  Office  [phone]

## 2018-03-05 NOTE — [place] FL2 (Signed)
[place] MEDICAID FL2 LEVEL OF CARE SCREENING TOOL     IDENTIFICATION  Patient Name: [name] Birthdate: 03-Dec-1929 Sex: male Admission Date (Current Location): 03/03/2018  [facility] and [place] Number:  Herbalist and Address:  [facility]. [facility], [place] [address], [place], [place] [address]      Provider Number: [id number]  Attending Physician Name and Address:  Louellen Molder, MD  Relative Name and Phone Number:       Current Level of Care: Hospital Recommended Level of Care: [place] Prior Approval Number:    Date Approved/Denied:   PASRR Number: [id number]  Discharge Plan: SNF    Current Diagnoses: Patient Active Problem List   Diagnosis Date Noted  . Constipation 03/03/2018  . AKI (acute kidney injury) ([place]) 03/03/2018  . Anemia 03/03/2018  . Chronic ischemic left MCA stroke 11/27/2017  . Aphasia due to recent stroke 11/11/2017  . Vascular dementia without behavioral disturbance ([place]) 08/16/2017  . Dyslipidemia 08/15/2017  . Physical deconditioning 08/15/2017  . Protein-calorie malnutrition ([place]) 08/15/2017  . Acute ischemic left MCA stroke ([place]) 08/13/2017  . Syncope and collapse 08/12/2017  . Right inguinal hernia 08/10/2017  . Syncope 08/09/2017  . Abnormal finding on urinalysis 08/09/2017  . Near syncope 11/22/2016  . Essential hypertension 05/08/2013  . Claudication of calf muscles ([place]) 05/06/2013  . PAD (peripheral artery disease) ([place]) 05/05/2013  . Right upper quadrant abdominal mass 11/18/2012  . History of BPH 10/20/2012  . Valvular heart disease 10/17/2012    Orientation RESPIRATION BLADDER Height & Weight     Self, Situation, Place  Normal Continent Weight: 149 lb (67.6 kg)(PT stated cant move) Height:  5\' 8"  (172.7 cm)  BEHAVIORAL SYMPTOMS/MOOD NEUROLOGICAL BOWEL NUTRITION STATUS  (None) (Vascular dementia without behavioral disturbance) Continent Diet(Heart healthy)  AMBULATORY STATUS  COMMUNICATION OF NEEDS Skin     Verbally Other (Comment)(Laceration right posterior head: No dressing.)                       Personal Care Assistance Level of Assistance              Functional Limitations Info  Sight, Hearing, Speech Sight Info: Adequate Hearing Info: Adequate Speech Info: Adequate    SPECIAL CARE FACTORS FREQUENCY  Blood pressure, PT (By licensed PT)     PT Frequency: 5 x week              Contractures Contractures Info: Not present    Additional Factors Info  Code Status, Allergies Code Status Info: DNR Allergies Info: NKDA           Current Medications (03/05/2018):  This is the current hospital active medication list Current Facility-Administered Medications  Medication Dose Route Frequency Provider Last Rate Last Dose  . acetaminophen (TYLENOL) tablet 650 mg  650 mg Oral Q6H PRN Janora Norlander, MD      . atorvastatin (LIPITOR) tablet 20 mg  20 mg Oral QHS Janora Norlander, MD   20 mg at 03/04/18 2219  . cholecalciferol (VITAMIN D) tablet 1,000 Units  1,000 Units Oral Daily Janora Norlander, MD   1,000 Units at 03/05/18 0847  . mirtazapine (REMERON) tablet 7.5 mg  7.5 mg Oral QHS Janora Norlander, MD   7.5 mg at 03/04/18 2219  . ondansetron (ZOFRAN) tablet 4 mg  4 mg Oral Q6H PRN Janora Norlander, MD       Or  . ondansetron (  ZOFRAN) injection 4 mg  4 mg Intravenous Q6H PRN Janora Norlander, MD   4 mg at 03/04/18 2263  . pantoprazole (PROTONIX) EC tablet 40 mg  40 mg Oral BID Dhungel, Nishant, MD   40 mg at 03/05/18 0847  . polyethylene glycol (MIRALAX / GLYCOLAX) packet 17 g  17 g Oral Daily Janora Norlander, MD   17 g at 03/03/18 1508  . senna-docusate (Senokot-S) tablet 1 tablet  1 tablet Oral QHS PRN Janora Norlander, MD      . senna-docusate (Senokot-S) tablet 2 tablet  2 tablet Oral BID Janora Norlander, MD   Stopped at 03/05/18 [id number]  . sertraline (ZOLOFT) tablet 50 mg  50 mg Oral Daily Janora Norlander, MD   50 mg at 03/05/18  0847  . sodium chloride flush (NS) 0.9 % injection 3 mL  3 mL Intravenous Q12H Janora Norlander, MD   3 mL at 03/05/18 5625     Discharge Medications: Please see discharge summary for a list of discharge medications.  Relevant Imaging Results:  Relevant Lab Results:   Additional Information SS: [id number]  Edon Hoadley C Yana Schorr, LCSW

## 2018-03-06 LAB — BASIC METABOLIC PANEL
Anion gap: 8 (ref 5–15)
BUN: 23 mg/dL (ref 8–23)
CHLORIDE: 111 mmol/L (ref 98–111)
CO2: 23 mmol/L (ref 22–32)
Calcium: 8.1 mg/dL — ABNORMAL LOW (ref 8.9–10.3)
Creatinine, Ser: 1.72 mg/dL — ABNORMAL HIGH (ref 0.61–1.24)
GFR calc Af Amer: 40 mL/min — ABNORMAL LOW (ref >60)
GFR calc non Af Amer: 34 mL/min — ABNORMAL LOW (ref >60)
Glucose, Bld: 80 mg/dL (ref 70–99)
POTASSIUM: 4.3 mmol/L (ref 3.5–5.1)
SODIUM: 142 mmol/L (ref 135–145)

## 2018-03-06 LAB — CBC
HEMATOCRIT: 31.1 % — AB (ref 39.0–52.0)
Hemoglobin: 9.2 g/dL — ABNORMAL LOW (ref 13.0–17.0)
MCH: 27.7 pg (ref 26.0–34.0)
MCHC: 29.6 g/dL — ABNORMAL LOW (ref 30.0–36.0)
MCV: 93.7 fL (ref 78.0–100.0)
Platelets: 396 10*3/uL (ref 150–400)
RBC: 3.32 MIL/uL — AB (ref 4.22–5.81)
RDW: 15.1 % (ref 11.5–15.5)
WBC: 4.5 10*3/uL (ref 4.0–10.5)

## 2018-03-06 LAB — GLUCOSE, CAPILLARY: Glucose-Capillary: 81 mg/dL (ref 70–99)

## 2018-03-06 MED ORDER — PEG 3350-KCL-NA BICARB-NACL 420 G PO SOLR
4000.0000 mL | Freq: Once | ORAL | Status: AC
  Administered 2018-03-06: 4000 mL via ORAL
  Filled 2018-03-06: qty 4000

## 2018-03-06 MED ORDER — METOPROLOL TARTRATE 12.5 MG HALF TABLET
12.5000 mg | ORAL_TABLET | Freq: Two times a day (BID) | ORAL | Status: DC
  Administered 2018-03-06 – 2018-03-13 (×14): 12.5 mg via ORAL
  Filled 2018-03-06 (×14): qty 1

## 2018-03-06 MED ORDER — SODIUM CHLORIDE 0.9 % IV SOLN
INTRAVENOUS | Status: DC
  Administered 2018-03-07: 01:00:00 via INTRAVENOUS

## 2018-03-06 MED ORDER — BISACODYL 5 MG PO TBEC
10.0000 mg | DELAYED_RELEASE_TABLET | Freq: Every day | ORAL | Status: DC | PRN
  Administered 2018-03-06: 10 mg via ORAL
  Filled 2018-03-06: qty 2

## 2018-03-06 NOTE — Progress Notes (Addendum)
Progress Note  Patient Name: [name] Date of Encounter: 03/06/2018  Primary Cardiologist: Pixie Casino, MD now with [facility] cardiology in [place] feeling well today. Up to chair and resting. Denies recurrent symptoms including chest pain or palpitations. Reports feeling better after procedure yesterday   Inpatient Medications    Scheduled Meds: . atorvastatin  20 mg Oral QHS  . cholecalciferol  1,000 Units Oral Daily  . feeding supplement (ENSURE ENLIVE)  237 mL Oral TID BM  . metoprolol tartrate  12.5 mg Oral BID  . mirtazapine  7.5 mg Oral QHS  . multivitamin with minerals  1 tablet Oral Daily  . pantoprazole  40 mg Oral BID  . polyethylene glycol  17 g Oral Daily  . polyethylene glycol-electrolytes  4,000 mL Oral Once  . senna-docusate  2 tablet Oral BID  . sertraline  50 mg Oral Daily  . sodium chloride flush  3 mL Intravenous Q12H   Continuous Infusions:  PRN Meds: acetaminophen, bisacodyl, lidocaine, ondansetron **OR** ondansetron (ZOFRAN) IV, senna-docusate   Vital Signs    Vitals:   03/05/18 1853 03/05/18 1948 03/06/18 0038 03/06/18 0536  BP: 119/74 126/73 121/74 130/74  Pulse: 96 84 90 (!) 103  Resp: 16 20 20 19   Temp: 98 F (36.7 C) 99.1 F (37.3 C) 98.6 F (37 C) 98.6 F (37 C)  TempSrc: Oral Oral Oral Oral  SpO2: 100% 100% 100% 99%  Weight:    73.9 kg  Height:        Intake/Output Summary (Last 24 hours) at 03/06/2018 1148 Last data filed at 03/06/2018 1038 Gross per 24 hour  Intake 963 ml  Output 825 ml  Net 138 ml   Filed Weights   03/04/18 0515 03/05/18 0507 03/06/18 0536  Weight: 71.5 kg 67.6 kg 73.9 kg   Physical Exam   General: Frail, elderly,  NAD Skin: Warm, dry, intact  Head: Normocephalic, atraumatic, clear, moist mucus membranes. Neck: Negative for carotid bruits. No JVD Lungs:Clear to ausculation bilaterally. No wheezes, rales, or rhonchi. Breathing is unlabored. Cardiovascular:  Irregularly irregular with S1 S2. No murmurs, rubs, gallops, or LV heave appreciated. Abdomen: Soft, non-tender, distended with normoactive bowel sounds.  No obvious abdominal masses. MSK: Strength and tone appear normal for age. 5/5 in all extremities Extremities: No edema. No clubbing or cyanosis. DP/PT pulses 2+ bilaterally Neuro: Alert and oriented. No focal deficits. No facial asymmetry. MAE spontaneously. Psych: Responds to questions appropriately with normal affect.    Labs    Chemistry Recent Labs  Lab 03/03/18 0919 03/04/18 0609 03/05/18 0419 03/06/18 0448  NA 138 139 138 142  K 4.9 4.0 3.7 4.3  CL 103 106 111 111  CO2 25 27 24 23   GLUCOSE 97 111* 105* 80  BUN 30* 29* 26* 23  CREATININE 2.34* 2.23* 2.14* 1.72*  CALCIUM 8.3* 8.2* 7.4* 8.1*  PROT 4.9*  --   --   --   ALBUMIN 2.3*  --   --   --   AST 19  --   --   --   ALT 9  --   --   --   ALKPHOS 98  --   --   --   BILITOT 1.0  --   --   --   GFRNONAA 24* 25* 26* 34*  GFRAA 27* 29* 30* 40*  ANIONGAP 10 6 3* 8     Hematology Recent Labs  Lab  03/04/18 0609 03/05/18 0419 03/06/18 0448  WBC 4.9 3.8* 4.5  RBC 3.43* 2.99* 3.32*  HGB 9.4* 8.2* 9.2*  HCT 32.0* 27.8* 31.1*  MCV 93.3 93.0 93.7  MCH 27.4 27.4 27.7  MCHC 29.4* 29.5* 29.6*  RDW 15.2 15.2 15.1  PLT 409* 362 396   Cardiac Enzymes Recent Labs  Lab 03/03/18 1416 03/03/18 2022  TROPONINI <0.03 <0.03    Recent Labs  Lab 03/03/18 1009  TROPIPOC 0.00    BNPNo results for input(s): BNP, PROBNP in the last 168 hours.   DDimer No results for input(s): DDIMER in the last 168 hours.   Radiology    US Renal  Result Date: 03/05/2018 CLINICAL DATA:  Acute tubular necrosis. EXAM: RENAL / URINARY TRACT ULTRASOUND COMPLETE COMPARISON:  CT 2 days ago. FINDINGS: Right Kidney: Length: 11.3 cm. Echogenicity within normal limits. No mass or hydronephrosis visualized. Left Kidney: Length: 12.0 cm. Echogenicity within normal limits. No mass or hydronephrosis  visualized. Bladder: Urine is present within the bladder.  No ureteral jet is visualized. There is ascites diffusely distributed. IMPRESSION: Kidneys appear normal by sonography. Normal size and echogenicity. No obstruction. Urine in the bladder. No ureteral jet is identified however. Diffuse ascites. Electronically Signed   By: Nelson Chimes M.D.   On: 03/05/2018 09:27   Ir Paracentesis  Result Date: 03/05/2018 INDICATION: Patient with history of congestive heart failure, stroke, malnutrition, acute kidney injury, suggested retroperitoneal adenopathy on CT, ascites. Request made for diagnostic and therapeutic paracentesis. EXAM: ULTRASOUND GUIDED DIAGNOSTIC AND THERAPEUTIC PARACENTESIS MEDICATIONS: None COMPLICATIONS: None immediate. PROCEDURE: Informed written consent was obtained from the patient after a discussion of the risks, benefits and alternatives to treatment. A timeout was performed prior to the initiation of the procedure. Initial ultrasound scanning demonstrates a moderate amount of ascites within the left mid to lower abdominal quadrant. The left mid to lower abdomen was prepped and draped in the usual sterile fashion. 2% lidocaine was used for local anesthesia. Following this, a 19 gauge, 7-cm, Yueh catheter was introduced. An ultrasound image was saved for documentation purposes. The paracentesis was performed. The catheter was removed and a dressing was applied. The patient tolerated the procedure well without immediate post procedural complication. FINDINGS: A total of approximately 2.8 liters of yellow fluid was removed. Samples were sent to the laboratory as requested by the clinical team. IMPRESSION: Successful ultrasound-guided diagnostic and therapeutic paracentesis yielding 2.8 liters of peritoneal fluid. Read by: Rowe Kawan, PA-C Electronically Signed   By: Sandi Mariscal M.D.   On: 03/05/2018 14:30   Telemetry    03/06/18 Atrial fibrillaiton with HRs in the 90-100's - Personally  Reviewed  ECG    No new tracing as of 03/06/18 - Personally Reviewed  Cardiac Studies   Echo 03/04/18 Study Conclusions  - Left ventricle: Difficult study Frequent ectopy. LVEF is approximately 35% with severe hypokinesis of the inferior, distal anterior and apical walls; hypokinesis of the inferoseptal wall. The cavity size was normal. Wall thickness was increased in a pattern of mild LVH. Doppler parameters are consistent with abnormal left ventricular relaxation (grade 1 diastolic dysfunction). - Aortic valve: There was trivial regurgitation. Valve area (Vmax): 2.59 cm^2. - Left atrium: The atrium was moderately dilated. - Pulmonary arteries: PA peak pressure: 34 mm Hg (S). - Pericardium, extracardiac: There was a right pleural effusion.  Patient Profile     84 y.o. male with a hx of HTN, PAD, CVA, dementia admitted after found down at his facility with hypotension  who is being seen today for the evaluation of CHF at the request of Dr. Clementeen Graham.  Assessment & Plan    1. Syncope: -Likely in the setting of hypotension versus AF>>>pt with no recollection of events  -Last wore monitor earlier this year which was found to be unrevealing -Troponin negative x3 with no ACS symptoms  -No recurrence since admission  -Event monitor at DC? -BP stabilized>>130/74>121/74>126/73  2. PAF: -Pt spontaneously converted on admission to NSR>>AF today with rate control -No prior CVA and no AF on home monitor  -Metoprolol 12.5mg  twice daily started 03/06/18>>monitor BP closely  -Given elevated score, anticoagulation is indicated however with recent fall, heme positive stool and unclear etiology of ascites would reconsider starting for now -Continue low doe BB for rate control  -CHA2DS2VASc =7   3. Acute systolic CHF: -Echocardiogram with LVEF of 35% with diffuse hypokinesis of the inferoseptal wall and G1DD>>last echo in 08/2017 with LV of 60-65% -New drop in LV  function>>>ischemia versus AF induced>>no ACS symptoms  -Last nuclear study in 2014 and 2018 with no inducible ischemia -Will need ischemic evaluation with stress test given the above>>>would recheck echo after hospital admission and plan for OP evaluation?  -Lopressor 12.5 mg initiated 03/06/18 -No ASA given concern for GI bleed   4. Ascites: -Unknown etiology  -s/p paracentesis on 03/05/18 with 2.8L removed  -Per primary team   5. AKI: -Creatinine, 1.72 today>>greatly improved from yesterday at 2.14>>likely in the setting of dehydration  -Baseline appears to be in the 0.8-0.9 range  -Avoid nephrotoxic medications and monitor with daily BMET -Per primary team    6. Anemia: -CBC with Hb 9.2 today>>improved from yesterday at 8.2 -Stools heme +    Signed, Kathyrn Drown NP-C [facility] Pager: [phone] 03/06/2018, 11:48 AM    For questions or updates, please contact   Please consult [email or web address] for contact info under Cardiology/STEMI. --------------------------------------------------------------------------------------------------------   History and all data above reviewed.  Patient examined.  I agree with the findings as above.  [name] is doing well. He is currently using bowel prep for colonoscopy.  Constitutional: No acute distress ENMT: poor dentition, moist mucous membranes Cardiovascular: irregular rhythm, normal rate, no murmurs. S1 and S2 normal. No jugular venous distention.  Respiratory: clear to auscultation bilaterally GI : normal bowel sounds, soft and nontender. Distended but not tense.   MSK: extremities warm, well perfused. Trace edema.  NEURO: grossly nonfocal exam, moves all extremities. PSYCH: alert and oriented x 3, normal mood and affect.   All available labs, radiology testing, previous records reviewed. Agree with documented assessment and plan of my colleague as stated above with the following additions or changes:  Principal  Problem:   Syncope and collapse Active Problems:   History of BPH   Right upper quadrant abdominal mass   Essential hypertension   PAD (peripheral artery disease) (HCC)   Dyslipidemia   Protein-calorie malnutrition (HCC)   Vascular dementia without behavioral disturbance (HCC)   Constipation   AKI (acute kidney injury) ([place])   Anemia   Malnutrition of moderate degree   Acute systolic heart failure (HCC)   Paroxysmal atrial fibrillation ([place])    Plan: Acute Anemia The primary service is planning for EGD and colonoscopy given a 4 g drop in hemoglobin from baseline with Hemoccult-positive stools.  Acute systolic dysfunction with heart failure The patient has had 2 previous stress test in 2014 and 2018 with no inducible ischemia which makes the possibility of severe coronary artery disease less  likely as the source of acute drop in systolic function.  My concern is that his reduced ejection fraction may be related to acute medical illness with anemia likely due to blood loss and paroxysmal atrial fibrillation.  After the evaluation for GI bleeding is complete and hemoglobin has stabilized, we could consider evaluation of the coronary arteries for diagnostic purposes, however at this time we will provide supportive cardiovascular measures.  We have initiated beta-blockade, and at this time ACE inhibition is being held due to renal function.  Cardiology will follow along as the patient undergoes a GI evaluation.   Elouise Munroe, MD [facility] 5:08 PM  03/06/2018

## 2018-03-06 NOTE — Progress Notes (Signed)
[facility] PROGRESS NOTE Subjective Patient without any problems following paracentesis.  SAAG is 0.7.  This suggests the ascitic fluid is not a transudate and does not come from portal hypertension.  The cell count is 350 which is elevated cytology is still pending.  He denies any pain.  Objective: Vital signs in last 24 hours: Temp:  [98 F (36.7 C)-99.1 F (37.3 C)] 98.6 F (37 C) (10/02 0536) Pulse Rate:  [84-103] 103 (10/02 0536) Resp:  [16-20] 19 (10/02 0536) BP: (119-130)/(73-74) 130/74 (10/02 0536) SpO2:  [99 %-100 %] 99 % (10/02 0536) Weight:  [73.9 kg] 73.9 kg (10/02 0536) Last BM Date: 03/05/18  Intake/Output from previous day: 10/01 0701 - 10/02 0700 In: 1588.3 [P.O.:720; I.V.:868.3] Out: 1025 [Urine:1025] Intake/Output this shift: No intake/output data recorded.  PE: General--alert appears oriented.  Abdomen--much less distended good bowel sounds nontender  Lab Results: Recent Labs    03/03/18 0919 03/04/18 0609 03/05/18 0419 03/06/18 0448  WBC 6.2 4.9 3.8* 4.5  HGB 10.7* 9.4* 8.2* 9.2*  HCT 36.2* 32.0* 27.8* 31.1*  PLT 379 409* 362 396   BMET Recent Labs    03/03/18 0919 03/04/18 0609 03/05/18 0419 03/06/18 0448  NA 138 139 138 142  K 4.9 4.0 3.7 4.3  CL 103 106 111 111  CO2 25 27 24 23   CREATININE 2.34* 2.23* 2.14* 1.72*   LFT Recent Labs    03/03/18 0919  PROT 4.9*  AST 19  ALT 9  ALKPHOS 98  BILITOT 1.0   PT/INR No results for input(s): LABPROT, INR in the last 72 hours. PANCREAS Recent Labs    03/03/18 0919  LIPASE 31         Studies/Results: US Renal  Result Date: 03/05/2018 CLINICAL DATA:  Acute tubular necrosis. EXAM: RENAL / URINARY TRACT ULTRASOUND COMPLETE COMPARISON:  CT 2 days ago. FINDINGS: Right Kidney: Length: 11.3 cm. Echogenicity within normal limits. No mass or hydronephrosis visualized. Left Kidney: Length: 12.0 cm. Echogenicity within normal limits. No mass or hydronephrosis visualized.  Bladder: Urine is present within the bladder.  No ureteral jet is visualized. There is ascites diffusely distributed. IMPRESSION: Kidneys appear normal by sonography. Normal size and echogenicity. No obstruction. Urine in the bladder. No ureteral jet is identified however. Diffuse ascites. Electronically Signed   By: Nelson Chimes M.D.   On: 03/05/2018 09:27   Ir Paracentesis  Result Date: 03/05/2018 INDICATION: Patient with history of congestive heart failure, stroke, malnutrition, acute kidney injury, suggested retroperitoneal adenopathy on CT, ascites. Request made for diagnostic and therapeutic paracentesis. EXAM: ULTRASOUND GUIDED DIAGNOSTIC AND THERAPEUTIC PARACENTESIS MEDICATIONS: None COMPLICATIONS: None immediate. PROCEDURE: Informed written consent was obtained from the patient after a discussion of the risks, benefits and alternatives to treatment. A timeout was performed prior to the initiation of the procedure. Initial ultrasound scanning demonstrates a moderate amount of ascites within the left mid to lower abdominal quadrant. The left mid to lower abdomen was prepped and draped in the usual sterile fashion. 2% lidocaine was used for local anesthesia. Following this, a 19 gauge, 7-cm, Yueh catheter was introduced. An ultrasound image was saved for documentation purposes. The paracentesis was performed. The catheter was removed and a dressing was applied. The patient tolerated the procedure well without immediate post procedural complication. FINDINGS: A total of approximately 2.8 liters of yellow fluid was removed. Samples were sent to the laboratory as requested by the clinical team. IMPRESSION: Successful ultrasound-guided diagnostic and therapeutic paracentesis yielding 2.8 liters of peritoneal fluid.  Read by: Rowe Celso, PA-C Electronically Signed   By: Sandi Mariscal M.D.   On: 03/05/2018 14:30    Medications: I have reviewed the patient's current medications.  Assessment:   1.  Anemia  with guaiac positive stools.  Hemoglobin has come up some without transfusion.  Patient does not know if he is ever had a colonoscopy but told me he would like to have one.  He does have a power of attorney. 2.  Ascites.  Several liters removed.  Cell count elevated at 360 not particularly impressive mostly lymphocytes cytology pending.  SAAG 0.7 suggesting ascitic fluid is not due to portal hypertension alone. 3.  PAF has converted.  Being followed by cardiology 4.  Azotemia.  Etiology unclear seems to be slowly improving   Plan: We will plan on going ahead with EGD and colonoscopy tomorrow.  I have discussed this with patient and he is agreeable but I am not sure he is entirely able to consent.  He does have a power of attorney whom I have called and left messages and we will try to continue to get him today.  We will switch him over to clear liquids and begin a prep.   Nancy Fetter 03/06/2018, 8:36 AM  This note was created using voice recognition software. Minor errors may Have occurred unintentionally.  Pager: [phone] If no answer or after hours call [phone]

## 2018-03-06 NOTE — H&P (View-Only) (Signed)
[facility] PROGRESS NOTE Subjective Patient without any problems following paracentesis.  SAAG is 0.7.  This suggests the ascitic fluid is not a transudate and does not come from portal hypertension.  The cell count is 350 which is elevated cytology is still pending.  He denies any pain.  Objective: Vital signs in last 24 hours: Temp:  [98 F (36.7 C)-99.1 F (37.3 C)] 98.6 F (37 C) (10/02 0536) Pulse Rate:  [84-103] 103 (10/02 0536) Resp:  [16-20] 19 (10/02 0536) BP: (119-130)/(73-74) 130/74 (10/02 0536) SpO2:  [99 %-100 %] 99 % (10/02 0536) Weight:  [73.9 kg] 73.9 kg (10/02 0536) Last BM Date: 03/05/18  Intake/Output from previous day: 10/01 0701 - 10/02 0700 In: 1588.3 [P.O.:720; I.V.:868.3] Out: 1025 [Urine:1025] Intake/Output this shift: No intake/output data recorded.  PE: General--alert appears oriented.  Abdomen--much less distended good bowel sounds nontender  Lab Results: Recent Labs    03/03/18 0919 03/04/18 0609 03/05/18 0419 03/06/18 0448  WBC 6.2 4.9 3.8* 4.5  HGB 10.7* 9.4* 8.2* 9.2*  HCT 36.2* 32.0* 27.8* 31.1*  PLT 379 409* 362 396   BMET Recent Labs    03/03/18 0919 03/04/18 0609 03/05/18 0419 03/06/18 0448  NA 138 139 138 142  K 4.9 4.0 3.7 4.3  CL 103 106 111 111  CO2 25 27 24 23   CREATININE 2.34* 2.23* 2.14* 1.72*   LFT Recent Labs    03/03/18 0919  PROT 4.9*  AST 19  ALT 9  ALKPHOS 98  BILITOT 1.0   PT/INR No results for input(s): LABPROT, INR in the last 72 hours. PANCREAS Recent Labs    03/03/18 0919  LIPASE 31         Studies/Results: US Renal  Result Date: 03/05/2018 CLINICAL DATA:  Acute tubular necrosis. EXAM: RENAL / URINARY TRACT ULTRASOUND COMPLETE COMPARISON:  CT 2 days ago. FINDINGS: Right Kidney: Length: 11.3 cm. Echogenicity within normal limits. No mass or hydronephrosis visualized. Left Kidney: Length: 12.0 cm. Echogenicity within normal limits. No mass or hydronephrosis visualized.  Bladder: Urine is present within the bladder.  No ureteral jet is visualized. There is ascites diffusely distributed. IMPRESSION: Kidneys appear normal by sonography. Normal size and echogenicity. No obstruction. Urine in the bladder. No ureteral jet is identified however. Diffuse ascites. Electronically Signed   By: Nelson Chimes M.D.   On: 03/05/2018 09:27   Ir Paracentesis  Result Date: 03/05/2018 INDICATION: Patient with history of congestive heart failure, stroke, malnutrition, acute kidney injury, suggested retroperitoneal adenopathy on CT, ascites. Request made for diagnostic and therapeutic paracentesis. EXAM: ULTRASOUND GUIDED DIAGNOSTIC AND THERAPEUTIC PARACENTESIS MEDICATIONS: None COMPLICATIONS: None immediate. PROCEDURE: Informed written consent was obtained from the patient after a discussion of the risks, benefits and alternatives to treatment. A timeout was performed prior to the initiation of the procedure. Initial ultrasound scanning demonstrates a moderate amount of ascites within the left mid to lower abdominal quadrant. The left mid to lower abdomen was prepped and draped in the usual sterile fashion. 2% lidocaine was used for local anesthesia. Following this, a 19 gauge, 7-cm, Yueh catheter was introduced. An ultrasound image was saved for documentation purposes. The paracentesis was performed. The catheter was removed and a dressing was applied. The patient tolerated the procedure well without immediate post procedural complication. FINDINGS: A total of approximately 2.8 liters of yellow fluid was removed. Samples were sent to the laboratory as requested by the clinical team. IMPRESSION: Successful ultrasound-guided diagnostic and therapeutic paracentesis yielding 2.8 liters of peritoneal fluid.  Read by: Rowe Helder, PA-C Electronically Signed   By: Sandi Mariscal M.D.   On: 03/05/2018 14:30    Medications: I have reviewed the patient's current medications.  Assessment:   1.  Anemia  with guaiac positive stools.  Hemoglobin has come up some without transfusion.  Patient does not know if he is ever had a colonoscopy but told me he would like to have one.  He does have a power of attorney. 2.  Ascites.  Several liters removed.  Cell count elevated at 360 not particularly impressive mostly lymphocytes cytology pending.  SAAG 0.7 suggesting ascitic fluid is not due to portal hypertension alone. 3.  PAF has converted.  Being followed by cardiology 4.  Azotemia.  Etiology unclear seems to be slowly improving   Plan: We will plan on going ahead with EGD and colonoscopy tomorrow.  I have discussed this with patient and he is agreeable but I am not sure he is entirely able to consent.  He does have a power of attorney whom I have called and left messages and we will try to continue to get him today.  We will switch him over to clear liquids and begin a prep.   Nancy Fetter 03/06/2018, 8:36 AM  This note was created using voice recognition software. Minor errors may Have occurred unintentionally.  Pager: [phone] If no answer or after hours call [phone]

## 2018-03-06 NOTE — Progress Notes (Signed)
Physical Therapy Treatment Patient Details Name: [name] MRN: [id number] DOB: 13-Jun-1929 Today's Date: 03/06/2018    History of Present Illness 84yo male brought to the ED by EMS from his facility, [place], after being found down at the facility. Appeared to have syncopal episode in bathroom. CT head negative for acute changes. PMH exertional dyspnea, HTN, PAD, CVA, cardiac cath     PT Comments    Patient doing well with therapy today, progressing to ambulation without AD. VSS on RA. Pt with mild unsteadiness and staggering, without supervision rec use of RW.   Follow Up Recommendations  Other (comment)(return to [facility] and get PT there)     Equipment Recommendations       Recommendations for Other Services       Precautions / Restrictions Precautions Precautions: Fall Restrictions Weight Bearing Restrictions: No    Mobility  Bed Mobility               [name] bed mobility comments: seated on BSC upon entry  Transfers Overall transfer level: Needs assistance Equipment used: 1 person hand held assist Transfers: Sit to/from Stand Sit to Stand: Supervision         [name] transfer comment: able to sit to stand without assistance   Ambulation/Gait Ambulation/Gait assistance: Supervision Gait Distance (Feet): 200 Feet Assistive device: None Gait Pattern/deviations: Step-through pattern;Decreased step length - right;Decreased stride length;Decreased step length - left Gait velocity: decreased   [name] Gait Details: supervision intermittent min guard, ambulating without AD with noteable staggers. pt aware able to correct. will be more safe with RW at this time   Stairs             Wheelchair Mobility    Modified Rankin (Stroke Patients Only)       Balance Overall balance assessment: No apparent balance deficits (not formally assessed)                                          Cognition Arousal/Alertness:  Awake/alert Behavior During Therapy: WFL for tasks assessed/performed Overall Cognitive Status: Within Functional Limits for tasks assessed                                        Exercises      [name] Comments        Pertinent Vitals/Pain Pain Assessment: No/denies pain    Home Living                      Prior Function            PT Goals (current goals can now be found in the care plan section) Acute Rehab PT Goals Patient Stated Goal: to get stronger  PT Goal Formulation: With patient Time For Goal Achievement: 03/18/18 Potential to Achieve Goals: Good Progress towards PT goals: Progressing toward goals    Frequency    Min 3X/week      PT Plan Current plan remains appropriate    Co-evaluation              AM-PAC PT "6 Clicks" Daily Activity  Outcome Measure  Difficulty turning over in bed (including adjusting bedclothes, sheets and blankets)?: A Little Difficulty moving from lying on back to sitting on the side of the bed? :  A Little Difficulty sitting down on and standing up from a chair with arms (e.g., wheelchair, bedside commode, etc,.)?: A Little Help needed moving to and from a bed to chair (including a wheelchair)?: A Little Help needed walking in hospital room?: A Little Help needed climbing 3-5 steps with a railing? : A Little 6 Click Score: 18    End of Session   Activity Tolerance: Patient tolerated treatment well Patient left: in chair;with chair alarm set;with call bell/phone within reach   PT Visit Diagnosis: Muscle weakness (generalized) (M62.81);History of falling (Z91.81)     Time: 1898-4210 PT Time Calculation (min) (ACUTE ONLY): 15 min  Charges:  $Gait Training: 8-22 mins                     Reinaldo Berber, PT, DPT Acute Rehabilitation Services Pager: [phone] Office: [phone]      Reinaldo Berber 03/06/2018, 3:29 PM

## 2018-03-06 NOTE — Progress Notes (Signed)
Per request of Dr Oletta Lamas, he asked we attempt to call POA to discuss EGD and colonscopy in am, he left message for Mr Oletta Lamas and he had attempted the niece number so paged him the number for [employer] POA

## 2018-03-06 NOTE — Progress Notes (Signed)
[name] returned call as she said she spoke to Dr Oletta Lamas on the telephone and asked her to call the nurse for consent, the verbal consent was received by self and [facility] Nurse. All questions entertained and answered via telephone

## 2018-03-06 NOTE — Progress Notes (Addendum)
PROGRESS NOTE                                                                                                                                                                                                             Patient Demographics:    [name], is a 84 y.o. male, DOB - 1929-07-28, FWY:[id number]  Admit date - 03/03/2018   Admitting Physician Janora Norlander, MD  Outpatient Primary MD for the patient is Patient, [name]  LOS - 3  Outpatient Specialists: none  Chief Complaint  Patient presents with  . Fall       Brief Narrative  84 year old male with history of hypertension, CVA (left inferior MCA infarct in March this year), mild dementia, hyperlipidemia, PAD, BPH resident of [facility] brought to ED by EMS after found on the floor of his facility.  Patient says he may have slipped on something but does not recall and thinks he passed out.  Patient complained of some abdominal discomfort and was trying to strain in the bathroom when he passed out.  Reportedly his systolic blood pressure was in the 80s when he was found. Patient reports being constipated frequently for the past 3 weeks associated with poor p.o. intake, significant weight loss and right-sided abdominal pain with occasional vomiting.  No fevers or chills. In the ED vitals were stable.  EKG showed irregular rhythm with P waves and dropped QRS complexes.  Noted for 450 cc urine in his bladder scan and was unable to urinate.  Blood work showed 2 g drop in H&H with positive Hemoccult, acute kidney injury.  Head CT was negative for acute findings. Admitted for further management.   Subjective:   Went into A. fib with controlled rate yesterday afternoon.  Patient had paracentesis with about 2.8 L yellow fluid removed negative for infection.  Informs feeling better after paracentesis.   Assessment  & Plan :    Principal Problem:   Syncope and  collapse Associated with dehydration versus A. fib with nonischemic cardiomyopathy.  Serial troponins negative.  Blood pressure currently stable.  Fluid discontinued.  Active Problems:  AKI (acute kidney injury) ([place]) Suspect prerenal ATN due to dehydration versus cardiorenal syndrome.. Patient's baseline creatinine is around 0.6, improving this a.m. but still almost 3 times over baseline. Avoid nephrotoxins.  Holding ACE inhibitor.  Strict I's/O and daily weight.  Renal ultrasound without any findings of medical renal disease or obstruction.  .  Abdominal ascites Etiology unclear.  History of liver hemangioma which could not be appreciated well due to lack of IV contrast on CT abdomen. Paracentesis with 2.8 L yellow fluid removed on 10/1.  Negative for SBP.  SAAG of 0.7 ruling out the possibility of portal hypertension.  Check fluid for cytology.   Acute combined systolic and diastolic CHF/?  Nonischemic cardio myopathy New finding.  2D echo in March this year showed normal EF.  Holding aspirin for concern of GI bleed.  Continue statin.  Holding ACE inhibitor.  Is mildly fluid overloaded on exam.  Off IV fluids.  Plan to add Lasix as renal function permits.  Cardiology consult appreciated.   Iron deficiency anemia/?  Acute blood loss anemia. As almost 4 g drop in hemoglobin from baseline.  Found to be iron deficient.  Hemoccult positive.  GI consult appreciated.  Continue PPI.  Plan on EGD and colonoscopy tomorrow. Hemoglobin better this morning.  Paroxysmal A. fib New finding this admission.  Patient had an event monitor after CVA earlier this year which did not capture any A. fib.  His chads2vasc score is  7 and would need anticoagulation but is being held until his hemoglobin is stable and GI procedure.  Rate controlled this morning.  Cardiology consult appreciated.  Will add low-dose metoprolol as per recommendation.  Protein calorie malnutrition, moderate Nutrition consult appreciated.   Patient reports very poor p.o. intake.  Added supplement.   History of CVA Left inferior MCA infarct in March 2019.  Continue  Lipitor, hold aspirin with concern for GI bleed..  Recent follow-up MRI of the brain done 3 weeks back shows expected evolution of the stroke.  Needs anticoagulation for new onset A. fib and high CHADS2vasc.    Generalized weakness PT evaluation.  Reports using both a walker and cane at the facility.  Constipation Continue senna-Colace and MiraLAX.  History of dementia Mild at baseline.  No acute issues.  Code Status : DNR  Family Communication  : We will update patient's family.  Disposition Plan  : Return to SNF once improved  Barriers For Discharge : Active symptoms  Consults  : [facility], IR, cardiology  Procedures  : CT head, CT abdomen without contrast, renal ultrasound, paracentesis, 2D echo   DVT Prophylaxis  : SCDs Lab Results  Component Value Date   PLT 396 03/06/2018    Antibiotics  :    Anti-infectives (From admission, onward)   None        Objective:   Vitals:   03/05/18 1853 03/05/18 1948 03/06/18 0038 03/06/18 0536  BP: 119/74 126/73 121/74 130/74  Pulse: 96 84 90 (!) 103  Resp: 16 20 20 19   Temp: 98 F (36.7 C) 99.1 F (37.3 C) 98.6 F (37 C) 98.6 F (37 C)  TempSrc: Oral Oral Oral Oral  SpO2: 100% 100% 100% 99%  Weight:    73.9 kg  Height:        Wt Readings from Last 3 Encounters:  03/06/18 73.9 kg  01/30/18 72.6 kg  01/07/18 72.6 kg     Intake/Output Summary (Last 24 hours) at 03/06/2018 1111 Last data filed at 03/06/2018 1038 Gross per 24 hour  Intake 963 ml  Output 825 ml  Net 138 ml   Physical exam Elderly male fatigued, not in distress HEENT: Pallor present, temporal wasting, moist mucosa, supple neck Chest:  Fine bibasilar crackles, CVS: Normal S1 and S2, no murmurs GI: Soft, abdominal distention, improved from yesterday, nontender, bowel sounds present Musculoskeletal: Warm, trace  edema CNS: Alert oriented     Data Review:    CBC Recent Labs  Lab 03/03/18 0919 03/04/18 0609 03/05/18 0419 03/06/18 0448  WBC 6.2 4.9 3.8* 4.5  HGB 10.7* 9.4* 8.2* 9.2*  HCT 36.2* 32.0* 27.8* 31.1*  PLT 379 409* 362 396  MCV 94.0 93.3 93.0 93.7  MCH 27.8 27.4 27.4 27.7  MCHC 29.6* 29.4* 29.5* 29.6*  RDW 15.0 15.2 15.2 15.1  LYMPHSABS 1.1  --   --   --   MONOABS 0.4  --   --   --   EOSABS 0.1  --   --   --   BASOSABS 0.0  --   --   --     Chemistries  Recent Labs  Lab 03/03/18 0919 03/04/18 0609 03/05/18 0419 03/06/18 0448  NA 138 139 138 142  K 4.9 4.0 3.7 4.3  CL 103 106 111 111  CO2 25 27 24 23   GLUCOSE 97 111* 105* 80  BUN 30* 29* 26* 23  CREATININE 2.34* 2.23* 2.14* 1.72*  CALCIUM 8.3* 8.2* 7.4* 8.1*  MG 1.8  --   --   --   AST 19  --   --   --   ALT 9  --   --   --   ALKPHOS 98  --   --   --   BILITOT 1.0  --   --   --    ------------------------------------------------------------------------------------------------------------------ No results for input(s): CHOL, HDL, LDLCALC, TRIG, CHOLHDL, LDLDIRECT in the last 72 hours.  Lab Results  Component Value Date   HGBA1C 5.2 08/10/2017   ------------------------------------------------------------------------------------------------------------------ Recent Labs    03/03/18 1416  TSH 2.318   ------------------------------------------------------------------------------------------------------------------ Recent Labs    03/03/18 1416  TIBC 218*  IRON 20*    Coagulation profile No results for input(s): INR, PROTIME in the last 168 hours.  No results for input(s): DDIMER in the last 72 hours.  Cardiac Enzymes Recent Labs  Lab 03/03/18 1416 03/03/18 2022  TROPONINI <0.03 <0.03   ------------------------------------------------------------------------------------------------------------------ No results found for: BNP  Inpatient Medications  Scheduled Meds: . atorvastatin  20 mg  Oral QHS  . cholecalciferol  1,000 Units Oral Daily  . feeding supplement (ENSURE ENLIVE)  237 mL Oral TID BM  . metoprolol tartrate  12.5 mg Oral BID  . mirtazapine  7.5 mg Oral QHS  . multivitamin with minerals  1 tablet Oral Daily  . pantoprazole  40 mg Oral BID  . polyethylene glycol  17 g Oral Daily  . polyethylene glycol-electrolytes  4,000 mL Oral Once  . senna-docusate  2 tablet Oral BID  . sertraline  50 mg Oral Daily  . sodium chloride flush  3 mL Intravenous Q12H   Continuous Infusions:  PRN Meds:.acetaminophen, bisacodyl, lidocaine, ondansetron **OR** ondansetron (ZOFRAN) IV, senna-docusate  Micro Results Recent Results (from the past 240 hour(s))  MRSA PCR Screening     Status: None   Collection Time: 03/03/18  3:20 PM  Result Value Ref Range Status   MRSA by PCR NEGATIVE NEGATIVE Final    Comment:        The GeneXpert MRSA Assay (FDA approved for NASAL specimens only), is one component of a comprehensive MRSA colonization surveillance program. It is not intended to diagnose MRSA infection nor to guide or monitor treatment for MRSA infections. Performed  at [facility], [place] [address] [place], [place] [address]   Gram stain     Status: None   Collection Time: 03/05/18  2:25 PM  Result Value Ref Range Status   Specimen Description PERITONEAL  Final   Special Requests   Final    NONE Performed at [facility], [place] [address] [place], [place] [address]    Gram Stain   Final    FEW WBC PRESENT,BOTH PMN AND MONONUCLEAR NO ORGANISMS SEEN    Report Status 03/05/2018 FINAL  Final    Radiology Reports Ct Abdomen Wo Contrast  Result Date: 03/03/2018 CLINICAL DATA:  Abdominal mass. Appears to be an abdominal wall hernia versus rectus sheath hematoma or mass. EXAM: CT ABDOMEN WITHOUT CONTRAST TECHNIQUE: Multidetector CT imaging of the abdomen was performed following the standard protocol without IV contrast. COMPARISON:  February 25, 2016 FINDINGS:  Lower chest: Mild atelectasis in the bases.  Small effusions. Hepatobiliary: Pneumobilia is consistent with previous sphincterotomy, unchanged, in the left hepatic lobe. The patient's known left hepatic mass is not well assessed without contrast on today's study. This has the appearance of a hemangioma on previous imaging. The liver is otherwise unremarkable. The contour of the liver is smooth and not nodular. The patient is status post cholecystectomy. Pancreas: Unremarkable. No pancreatic ductal dilatation or surrounding inflammatory changes. Spleen: Normal in size without focal abnormality. Adrenals/Urinary Tract: Adrenal glands are unremarkable. Kidneys are normal, without renal calculi, focal lesion, or hydronephrosis. Stomach/Bowel: The stomach and small bowel are normal without identified obstruction. The colon is decompressed but does contain contrast. No obvious colonic abnormality. The cecum and appendix, if the patient has 1, are below today's film. Vascular/Lymphatic: Atherosclerotic changes in the nonaneurysmal aorta. Probable shotty and mildly enlarged retroperitoneal nodes, poorly evaluated due to lack of contrast and lack of intra-abdominal fat. An apparent left periaortic node on image 35 measures 15 mm in short axis. Other: Diffuse ascites of uncertain etiology. Lipoma in the right abdominal wall as seen on series 3, image 34. No other abdominal wall mass. Tiny periumbilical hernia. No other hernia is noted. No rectus muscle hematoma identified. Musculoskeletal: No acute or significant osseous findings. IMPRESSION: 1. Diffuse ascites of uncertain etiology. 2. Suggested retroperitoneal adenopathy, poorly evaluated due to lack of contrast and lack of intra-abdominal fat. The nodes are nonspecific but could be reactive or neoplastic. Recommend follow-up after resolution of the patient's ascites for better evaluation. 3. Tiny pleural effusions and mild bibasilar atelectasis. 4. Atherosclerotic changes  in the nonaneurysmal aorta. 5. Right abdominal wall lipoma. 6. Tiny periumbilical fat containing hernia. Electronically Signed   By: Dorise Bullion III M.D   On: 03/03/2018 18:17   Dg Chest 2 View  Result Date: 03/03/2018 CLINICAL DATA:  Unwitnessed fall.  Hypertension. EXAM: CHEST - 2 VIEW COMPARISON:  August 09, 2017 FINDINGS: There is atelectatic change in the right mid lower lung zones. There is no edema or consolidation. Heart is borderline enlarged with pulmonary vascularity normal. Aorta is prominent and rather tortuous, stable. There are metallic fragments in the left hemithorax. No pneumothorax. No bone lesions. IMPRESSION: No edema or consolidation. Areas of mild atelectatic change on the right. Stable cardiac silhouette. Aortic prominence and tortuosity likely reflect chronic hypertensive change. Electronically Signed   By: Lowella Grip III M.D.   On: 03/03/2018 11:44   Ct Head Wo Contrast  Result Date: 03/03/2018 CLINICAL DATA:  Unwitnessed fall EXAM: CT HEAD WITHOUT CONTRAST TECHNIQUE: Contiguous axial images  were obtained from the base of the skull through the vertex without intravenous contrast. COMPARISON:  Head CT August 09, 2017 and brain MRI February 08, 2018 FINDINGS: Brain: There is moderate diffuse atrophy. There is no intracranial mass, hemorrhage, extra-axial fluid collection, or midline shift. There is evidence of a prior infarct involving much of the left occipital lobe, stable. There is evidence of a prior infarct in the mid right occipital lobe, stable. There is evidence of a prior infarct at the gray-white junction of the anterior right parietal lobe, stable. Elsewhere there is patchy small vessel disease in the centra semiovale bilaterally. There is no acute appearing infarct. Basal ganglia calcification bilaterally is a physiologic finding. A small calcification in the mid right cerebellum may represent a small granuloma. This finding was present on prior study. Vascular:  There is no hyperdense vessel. There is calcification in the left vertebral artery as well as in both carotid siphon regions. Skull: Bony calvarium appears intact. There is a right parietal scalp hematoma. Sinuses/Orbits: There is opacification in a posterior ethmoid air cell. There is mucosal thickening in several ethmoid air cells bilaterally. There is inward bowing of each medial orbital wall, a finding that may be congenital or may be due to previous trauma. This finding is stable. No intraorbital lesions are evident. Other: Mastoid air cells are clear. IMPRESSION: 1. Right parietal scalp hematoma with underlying bony calvarium intact. 2. Atrophy with supratentorial small vessel disease. Prior infarcts as noted, largest in the occipital lobes. 3.  No mass or hemorrhage.  No extra-axial fluid collection. 4.  Foci of arterial vascular calcification. 5.  Ethmoid sinus disease noted. Electronically Signed   By: Lowella Grip III M.D.   On: 03/03/2018 11:21   Mr Brain Wo Contrast  Result Date: 02/08/2018  [facility] [address], [place], [place] [address] [phone] NEUROIMAGING REPORT STUDY DATE: 02/08/2018 PATIENT NAME: [name] DOB: 01/22/1930 MRN: [id number] EXAM: MRI Brain without contrast ORDERING CLINICIAN: Venancio Poisson, NP CLINICAL HISTORY: 84 year old man with history of stroke and neurologic symptoms COMPARISON FILMS: MRI 08/10/2017 TECHNIQUE: MRI of the brain without contrast was obtained utilizing 5 mm axial slices with T1, T2, T2 flair, SWI and diffusion weighted views.  T1 sagittal and T2 coronal views were obtained. CONTRAST: none IMAGING SITE: [facility], [place], [place] FINDINGS: On sagittal images, the spinal cord is imaged caudally to C4 and is normal in caliber.  There are degenerative changes at C3-C4.  The contents of the posterior fossa are of normal size and position.   The pituitary gland and optic chiasm appear normal.     There is mild to moderate generalized cortical atrophy mild corpus callosal atrophy..  There are no abnormal extra-axial collections of fluid.  The cerebellum and brainstem appears normal.  T1 hyperintense focus posteriorly in the pons on the T1 sagittal images not seen on any of the other sequences and likely representing artifact.  The deep gray matter appears normal.  Remote stroke in the MCA distribution on the left predominantly in the posterior temporal lobe and adjacent parietal lobe. Susceptibility weighted images show some microhemorrhages within the left temporoparietal stroke.   Of note, the stroke was acute on the 08/10/2017 MRI and it has shown expected evolution over time.  There is also a remote right parietal stroke unchanged compared to the previous MRI.  There are scattered T2/FLAIR hypertense foci consistent with chronic microvascular ischemic change.  The current diffusion-weighted images are normal.  The VIIth/VIIIth nerve complex appears normal.  There are bilateral lens replacements.  The orbits are otherwise normal.  The mastoid air cells appear normal.  The paranasal sinuses appear normal.  Flow voids are identified within the major intracerebral arteries.     This MRI of the brain without contrast shows the following: 1.    Expected evolution of the temporal parietal stroke that was acute on the 08/10/2017 MRI.   There is a mild amount of chronic microhemorrhage noted 2.    Remote right parietal stroke and chronic microvascular ischemic change. 3.    There are no acute findings. INTERPRETING PHYSICIAN: Richard A. Felecia Shelling, MD, PhD, FAAN Certified in  Neuroimaging by [employer]   US Renal  Result Date: 03/05/2018 CLINICAL DATA:  Acute tubular necrosis. EXAM: RENAL / URINARY TRACT ULTRASOUND COMPLETE COMPARISON:  CT 2 days ago. FINDINGS: Right Kidney: Length: 11.3 cm. Echogenicity within normal limits. No mass or hydronephrosis visualized. Left Kidney: Length: 12.0 cm.  Echogenicity within normal limits. No mass or hydronephrosis visualized. Bladder: Urine is present within the bladder.  No ureteral jet is visualized. There is ascites diffusely distributed. IMPRESSION: Kidneys appear normal by sonography. Normal size and echogenicity. No obstruction. Urine in the bladder. No ureteral jet is identified however. Diffuse ascites. Electronically Signed   By: Nelson Chimes M.D.   On: 03/05/2018 09:27   Ir Paracentesis  Result Date: 03/05/2018 INDICATION: Patient with history of congestive heart failure, stroke, malnutrition, acute kidney injury, suggested retroperitoneal adenopathy on CT, ascites. Request made for diagnostic and therapeutic paracentesis. EXAM: ULTRASOUND GUIDED DIAGNOSTIC AND THERAPEUTIC PARACENTESIS MEDICATIONS: None COMPLICATIONS: None immediate. PROCEDURE: Informed written consent was obtained from the patient after a discussion of the risks, benefits and alternatives to treatment. A timeout was performed prior to the initiation of the procedure. Initial ultrasound scanning demonstrates a moderate amount of ascites within the left mid to lower abdominal quadrant. The left mid to lower abdomen was prepped and draped in the usual sterile fashion. 2% lidocaine was used for local anesthesia. Following this, a 19 gauge, 7-cm, Yueh catheter was introduced. An ultrasound image was saved for documentation purposes. The paracentesis was performed. The catheter was removed and a dressing was applied. The patient tolerated the procedure well without immediate post procedural complication. FINDINGS: A total of approximately 2.8 liters of yellow fluid was removed. Samples were sent to the laboratory as requested by the clinical team. IMPRESSION: Successful ultrasound-guided diagnostic and therapeutic paracentesis yielding 2.8 liters of peritoneal fluid. Read by: Rowe Meryl, PA-C Electronically Signed   By: Sandi Mariscal M.D.   On: 03/05/2018 14:30    Time Spent in minutes   35   Kincade Granberg M.D on 03/06/2018 at 11:11 AM  Between 7am to 7pm - Pager - [phone]  After 7pm go to [email or web address] - password [facility]  [facility] -  Office  [phone]

## 2018-03-07 ENCOUNTER — Encounter (HOSPITAL_COMMUNITY): Payer: Self-pay | Admitting: *Deleted

## 2018-03-07 ENCOUNTER — Inpatient Hospital Stay (HOSPITAL_COMMUNITY): Payer: Medicare Other | Admitting: Certified Registered Nurse Anesthetist

## 2018-03-07 ENCOUNTER — Encounter (HOSPITAL_COMMUNITY)
Admission: EM | Disposition: A | Discharge status: Skilled Nursing Facility | Payer: Self-pay | Source: Home / Self Care | Attending: Internal Medicine

## 2018-03-07 DIAGNOSIS — I5021 Acute systolic (congestive) heart failure: Secondary | ICD-10-CM

## 2018-03-07 DIAGNOSIS — I480 Paroxysmal atrial fibrillation: Secondary | ICD-10-CM

## 2018-03-07 HISTORY — PX: RECTAL EXAM UNDER ANESTHESIA: SHX6399

## 2018-03-07 HISTORY — PX: BIOPSY: SHX5522

## 2018-03-07 HISTORY — PX: ESOPHAGOGASTRODUODENOSCOPY (EGD) WITH PROPOFOL: SHX5813

## 2018-03-07 LAB — CBC
HCT: 33.4 % — ABNORMAL LOW (ref 39.0–52.0)
Hemoglobin: 9.9 g/dL — ABNORMAL LOW (ref 13.0–17.0)
MCH: 27.5 pg (ref 26.0–34.0)
MCHC: 29.6 g/dL — ABNORMAL LOW (ref 30.0–36.0)
MCV: 92.8 fL (ref 78.0–100.0)
PLATELETS: 454 10*3/uL — AB (ref 150–400)
RBC: 3.6 MIL/uL — AB (ref 4.22–5.81)
RDW: 15.3 % (ref 11.5–15.5)
WBC: 6 10*3/uL (ref 4.0–10.5)

## 2018-03-07 LAB — GLUCOSE, CAPILLARY
Glucose-Capillary: 67 mg/dL — ABNORMAL LOW (ref 70–99)
Glucose-Capillary: 97 mg/dL (ref 70–99)

## 2018-03-07 LAB — BASIC METABOLIC PANEL
Anion gap: 6 (ref 5–15)
BUN: 22 mg/dL (ref 8–23)
CALCIUM: 8.2 mg/dL — AB (ref 8.9–10.3)
CHLORIDE: 108 mmol/L (ref 98–111)
CO2: 25 mmol/L (ref 22–32)
CREATININE: 1.52 mg/dL — AB (ref 0.61–1.24)
GFR calc non Af Amer: 40 mL/min — ABNORMAL LOW (ref >60)
GFR, EST AFRICAN AMERICAN: 46 mL/min — AB (ref >60)
Glucose, Bld: 89 mg/dL (ref 70–99)
Potassium: 4.2 mmol/L (ref 3.5–5.1)
SODIUM: 139 mmol/L (ref 135–145)

## 2018-03-07 SURGERY — ESOPHAGOGASTRODUODENOSCOPY (EGD) WITH PROPOFOL
Anesthesia: Monitor Anesthesia Care

## 2018-03-07 MED ORDER — PROPOFOL 10 MG/ML IV BOLUS
INTRAVENOUS | Status: DC | PRN
  Administered 2018-03-07: 40 mg via INTRAVENOUS
  Administered 2018-03-07: 20 mg via INTRAVENOUS

## 2018-03-07 MED ORDER — DEXTROSE 50 % IV SOLN: 25.0000 g | Freq: Once | INTRAVENOUS | Status: DC

## 2018-03-07 MED ORDER — DEXTROSE 50 % IV SOLN
25.0000 mL | Freq: Once | INTRAVENOUS | Status: AC
  Administered 2018-03-07: 25 mL via INTRAVENOUS

## 2018-03-07 MED ORDER — DEXTROSE-NACL 5-0.9 % IV SOLN
INTRAVENOUS | Status: DC
  Administered 2018-03-07: 16:00:00 via INTRAVENOUS
  Administered 2018-03-08: 50 mL/h via INTRAVENOUS
  Administered 2018-03-10: 17:00:00 via INTRAVENOUS

## 2018-03-07 MED ORDER — PROPOFOL 500 MG/50ML IV EMUL
INTRAVENOUS | Status: DC | PRN
  Administered 2018-03-07: 50 ug/kg/min via INTRAVENOUS

## 2018-03-07 SURGICAL SUPPLY — 25 items

## 2018-03-07 NOTE — Anesthesia Procedure Notes (Signed)
Procedure Name: MAC Date/Time: 03/07/2018 12:00 PM Performed by: Leonor Liv, CRNA Pre-anesthesia Checklist: Patient identified, Emergency Drugs available, Suction available, Patient being monitored and Timeout performed Patient Re-evaluated:Patient Re-evaluated prior to induction Oxygen Delivery Method: Simple face mask Placement Confirmation: positive ETCO2

## 2018-03-07 NOTE — Care Management Important Message (Signed)
Important Message  Patient Details  Name: [name] MRN: [id number] Date of Birth: 1929-08-28   Medicare Important Message Given:  Yes    Erenest Rasher, RN 03/07/2018, 3:48 PM

## 2018-03-07 NOTE — Op Note (Signed)
[facility] Patient Name: [name] Procedure Date : 03/07/2018 MRN: [id number] Attending MD: Nancy Fetter Dr., MD Date of Birth: 01/09/1930 CSN: [id number] Age: 84 Admit Type: Inpatient Procedure:                Upper GI endoscopy with biopsy Indications:              Iron deficiency anemia secondary to chronic blood                            loss, Heme positive stool Providers:                Jeneen Rinks L. Donelle Hise Dr., MD, Carlyn Reichert, RN, Tinnie Gens, Technician, Trixie Deis, CRNA Referring MD:              Medicines:                Monitored Anesthesia Care Complications:            No immediate complications. Estimated Blood Loss:     Estimated blood loss was minimal. Procedure:                Pre-Anesthesia Assessment:                           - Prior to the procedure, a History and Physical                            was performed, and patient medications and                            allergies were reviewed. The patient's tolerance of                            previous anesthesia was also reviewed. The risks                            and benefits of the procedure and the sedation                            options and risks were discussed with the patient.                            All questions were answered, and informed consent                            was obtained. Prior Anticoagulants: The patient has                            taken no previous anticoagulant or antiplatelet                            agents. ASA Grade Assessment: III - A patient with  severe systemic disease. After reviewing the risks                            and benefits, the patient was deemed in                            satisfactory condition to undergo the procedure.                           After obtaining informed consent, the endoscope was                            passed under direct vision. Throughout the                     procedure, the patient's blood pressure, pulse, and                            oxygen saturations were monitored continuously. The                            [id number]-[id number] ([id number] Olympus adult EGD was introduced                            through the mouth, and advanced to the antrum of                            the stomach. The upper GI endoscopy was                            accomplished without difficulty. The patient                            tolerated the procedure well. Scope In: Scope Out: Findings:      Food was found in the mid esophagus.      There is no endoscopic evidence of esophagitis, inflammation, stricture       or varices in the entire esophagus.      A small amount of food (residue) was found on the greater curvature of       the stomach.      A large, fungating, infiltrative, polypoid and ulcerated,       circumferential mass with no bleeding and no stigmata of recent bleeding       was found in the prepyloric region of the stomach. Biopsies were taken       with a cold forceps for histology. this mass infiltrated into his       duodenum and we were unable to pass into the duodenum. 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 Impression:               - Food in the mid esophagus.                           - A small amount of food (residue) in the stomach.                           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 Biopsied. he has in essence                            a complete gastric outlet obstruction. Recommendation:           - Return patient to hospital ward for ongoing care.                           - NPO. Procedure Code(s):        --- Professional ---                           [phone], 31, Esophagogastroduodenoscopy, flexible,                            transoral; with biopsy, single or multiple Diagnosis Code(s):        --- Professional ---                            D49.0, Neoplasm of unspecified behavior of                            digestive system                           D50.0, Iron deficiency anemia secondary to blood                            loss (chronic)                           T18.128A, Food in esophagus causing other injury,                            initial encounter CPT copyright 2017 [employer]. All rights reserved. The codes documented in this report are preliminary and upon coder review may  be revised to meet current compliance requirements. Nancy Fetter Dr., MD 03/07/2018 12:36:44 PM This report has been signed electronically. Number of Addenda: 0

## 2018-03-07 NOTE — Progress Notes (Signed)
PROGRESS NOTE                                                                                                                                                                                                             Patient Demographics:    [name], is a 84 y.o. male, DOB - 1929-11-21, MOL:[id number]  Admit date - 03/03/2018   Admitting Physician Janora Norlander, MD  Outpatient Primary MD for the patient is Patient, [name]  LOS - 4  Outpatient Specialists: none  Chief Complaint  Patient presents with  . Fall       Brief Narrative  84 year old male with history of hypertension, CVA (left inferior MCA infarct in March this year), mild dementia, hyperlipidemia, PAD, BPH resident of [facility] brought to ED by EMS after found on the floor of his facility.  Patient says he may have slipped on something but does not recall and thinks he passed out.  Patient complained of some abdominal discomfort and was trying to strain in the bathroom when he passed out.  Reportedly his systolic blood pressure was in the 80s when he was found. Patient reports being constipated frequently for the past 3 weeks associated with poor p.o. intake, significant weight loss and right-sided abdominal pain with occasional vomiting.  No fevers or chills. In the ED vitals were stable.  EKG showed irregular rhythm with P waves and dropped QRS complexes.  Noted for 450 cc urine in his bladder scan and was unable to urinate.  Blood work showed 2 g drop in H&H with positive Hemoccult, acute kidney injury.  Head CT was negative for acute findings. Admitted for further management.   Subjective:   Heart rate has been stable on telemetry and in sinus rhythm.  Reports his abdominal feels much better.  No overnight events.  Assessment  & Plan :    Principal Problem:   Syncope and collapse ?dehydration with overlap of new onset A. fib and  cardiomyopathy.  Blood pressure remained stable.  All fluids.  Active Problems:  AKI (acute kidney injury) ([place]) Suspect prerenal ATN due to dehydration versus cardiorenal syndrome.. Patient's baseline creatinine is around 0.6, now slowly improving and >2 times normal from baseline. Avoid nephrotoxins.  Continue to hold ACE inhibitor.  Strict I's/O and daily weight.  Renal ultrasound without  any findings of medical renal disease or obstruction.  .  Abdominal ascites Etiology unclear.  History of liver hemangioma which could not be appreciated well due to lack of IV contrast on CT abdomen. Paracentesis with 2.8 L yellow fluid removed on 10/1.  Negative for SBP.  SAAG of 0.7 ruling out the possibility of portal hypertension.  Fluid cytology pending.   Acute combined systolic and diastolic CHF/?  Nonischemic cardiomyopathy New finding.  2D echo in March this year showed normal EF.  Holding aspirin for concern of GI bleed.  Continue statin.  Holding ACE inhibitor. Plan to add Lasix as renal function permits.  Cardiology consult appreciated.   Iron deficiency anemia/?  Acute blood loss anemia. As almost 4 g drop in hemoglobin from baseline.  Found to be iron deficient.  Hemoccult positive.  GI consult appreciated.  Continue PPI.  Hemoglobin has remained stable in past 2 days.  EGD and colonoscopy today.  Paroxysmal A. fib New finding this admission.  Patient had an event monitor after CVA earlier this year which did not capture any A. fib.  His chads2vasc score is  7 and would need anticoagulation which will be determined following EGD and colonoscopy results.  Cardiology following.  Protein calorie malnutrition, moderate Nutrition consult appreciated.  Patient reports very poor p.o. intake.  Added supplement.   History of CVA Left inferior MCA infarct in March 2019.  Continue  Lipitor, hold aspirin with concern for GI bleed..  Recent follow-up MRI of the brain done 3 weeks back shows expected  evolution of the stroke.  Needs anticoagulation for new onset A. fib and high CHADS2vasc.    Generalized weakness PT evaluation.  Reports using both a walker and cane at the facility.  Constipation Continue senna-Colace and MiraLAX.  History of dementia Mild at baseline.  No acute issues.  Code Status : DNR  Family Communication  : Family updated.  Disposition Plan  : Return to SNF possibly in 1-2 days once acute issues have resolved.  Barriers For Discharge : Active symptoms  Consults  : [facility], IR, cardiology  Procedures  : CT head, CT abdomen without contrast, renal ultrasound, paracentesis, 2D echo, EGD and colonoscopy today.   DVT Prophylaxis  : SCDs Lab Results  Component Value Date   PLT 454 (H) 03/07/2018    Antibiotics  :    Anti-infectives (From admission, onward)   None        Objective:   Vitals:   03/07/18 0033 03/07/18 0459 03/07/18 0950 03/07/18 1034  BP: 123/83 (!) 151/66 119/65 (!) 144/65  Pulse: 77 79 86 88  Resp: 18 18 20  (!) 21  Temp: 98.2 F (36.8 C) 98.3 F (36.8 C) 98.7 F (37.1 C) 98.1 F (36.7 C)  TempSrc: Oral Oral Oral Oral  SpO2: 99% 100% 99% 97%  Weight:  70.5 kg    Height:        Wt Readings from Last 3 Encounters:  03/07/18 70.5 kg  01/30/18 72.6 kg  01/07/18 72.6 kg     Intake/Output Summary (Last 24 hours) at 03/07/2018 1036 Last data filed at 03/07/2018 0913 Gross per 24 hour  Intake 4263 ml  Output 200 ml  Net 4063 ml   Physical exam Not in distress HEENT: Pallor present, temporal wasting, moist mucosa Chest: Clear bilaterally CVs: Normal S1-S2 no murmur GI: Soft, mild abdominal distention, nontender, bowel sounds present Musculoskeletal: Warm, no edema today,       Data Review:  CBC Recent Labs  Lab 03/03/18 0919 03/04/18 0609 03/05/18 0419 03/06/18 0448 03/07/18 0534  WBC 6.2 4.9 3.8* 4.5 6.0  HGB 10.7* 9.4* 8.2* 9.2* 9.9*  HCT 36.2* 32.0* 27.8* 31.1* 33.4*  PLT 379 409* 362 396 454*    MCV 94.0 93.3 93.0 93.7 92.8  MCH 27.8 27.4 27.4 27.7 27.5  MCHC 29.6* 29.4* 29.5* 29.6* 29.6*  RDW 15.0 15.2 15.2 15.1 15.3  LYMPHSABS 1.1  --   --   --   --   MONOABS 0.4  --   --   --   --   EOSABS 0.1  --   --   --   --   BASOSABS 0.0  --   --   --   --     Chemistries  Recent Labs  Lab 03/03/18 0919 03/04/18 0609 03/05/18 0419 03/06/18 0448 03/07/18 0534  NA 138 139 138 142 139  K 4.9 4.0 3.7 4.3 4.2  CL 103 106 111 111 108  CO2 25 27 24 23 25   GLUCOSE 97 111* 105* 80 89  BUN 30* 29* 26* 23 22  CREATININE 2.34* 2.23* 2.14* 1.72* 1.52*  CALCIUM 8.3* 8.2* 7.4* 8.1* 8.2*  MG 1.8  --   --   --   --   AST 19  --   --   --   --   ALT 9  --   --   --   --   ALKPHOS 98  --   --   --   --   BILITOT 1.0  --   --   --   --    ------------------------------------------------------------------------------------------------------------------ No results for input(s): CHOL, HDL, LDLCALC, TRIG, CHOLHDL, LDLDIRECT in the last 72 hours.  Lab Results  Component Value Date   HGBA1C 5.2 08/10/2017   ------------------------------------------------------------------------------------------------------------------ No results for input(s): TSH, T4TOTAL, T3FREE, THYROIDAB in the last 72 hours.  Invalid input(s): FREET3 ------------------------------------------------------------------------------------------------------------------ No results for input(s): VITAMINB12, FOLATE, FERRITIN, TIBC, IRON, RETICCTPCT in the last 72 hours.  Coagulation profile No results for input(s): INR, PROTIME in the last 168 hours.  No results for input(s): DDIMER in the last 72 hours.  Cardiac Enzymes Recent Labs  Lab 03/03/18 1416 03/03/18 2022  TROPONINI <0.03 <0.03   ------------------------------------------------------------------------------------------------------------------ No results found for: BNP  Inpatient Medications  Scheduled Meds: . [MAR Hold] atorvastatin  20 mg Oral QHS  .  [MAR Hold] cholecalciferol  1,000 Units Oral Daily  . [MAR Hold] feeding supplement (ENSURE ENLIVE)  237 mL Oral TID BM  . [MAR Hold] metoprolol tartrate  12.5 mg Oral BID  . [MAR Hold] mirtazapine  7.5 mg Oral QHS  . [MAR Hold] multivitamin with minerals  1 tablet Oral Daily  . [MAR Hold] pantoprazole  40 mg Oral BID  . [MAR Hold] polyethylene glycol  17 g Oral Daily  . [MAR Hold] senna-docusate  2 tablet Oral BID  . [MAR Hold] sertraline  50 mg Oral Daily  . [MAR Hold] sodium chloride flush  3 mL Intravenous Q12H   Continuous Infusions: . sodium chloride 20 mL/hr at 03/07/18 0108   PRN Meds:.[MAR Hold] acetaminophen, [MAR Hold] bisacodyl, lidocaine, [MAR Hold] ondansetron **OR** [MAR Hold] ondansetron (ZOFRAN) IV, [MAR Hold] senna-docusate  Micro Results Recent Results (from the past 240 hour(s))  MRSA PCR Screening     Status: None   Collection Time: 03/03/18  3:20 PM  Result Value Ref Range Status   MRSA by PCR NEGATIVE NEGATIVE Final  Comment:        The GeneXpert MRSA Assay (FDA approved for NASAL specimens only), is one component of a comprehensive MRSA colonization surveillance program. It is not intended to diagnose MRSA infection nor to guide or monitor treatment for MRSA infections. Performed at [facility], [place] [address] [place], [place] [address]   Culture, body fluid-bottle     Status: None (Preliminary result)   Collection Time: 03/05/18  2:25 PM  Result Value Ref Range Status   Specimen Description PERITONEAL  Final   Special Requests NONE  Final   Culture   Final    NO GROWTH 2 DAYS Performed at [facility] [address] [place], [place] [address]    Report Status PENDING  Incomplete  Gram stain     Status: None   Collection Time: 03/05/18  2:25 PM  Result Value Ref Range Status   Specimen Description PERITONEAL  Final   Special Requests   Final    NONE Performed at [facility], [address], [place], [place] [address]     Gram Stain   Final    FEW WBC PRESENT,BOTH PMN AND MONONUCLEAR NO ORGANISMS SEEN    Report Status 03/05/2018 FINAL  Final    Radiology Reports Ct Abdomen Wo Contrast  Result Date: 03/03/2018 CLINICAL DATA:  Abdominal mass. Appears to be an abdominal wall hernia versus rectus sheath hematoma or mass. EXAM: CT ABDOMEN WITHOUT CONTRAST TECHNIQUE: Multidetector CT imaging of the abdomen was performed following the standard protocol without IV contrast. COMPARISON:  February 25, 2016 FINDINGS: Lower chest: Mild atelectasis in the bases.  Small effusions. Hepatobiliary: Pneumobilia is consistent with previous sphincterotomy, unchanged, in the left hepatic lobe. The patient's known left hepatic mass is not well assessed without contrast on today's study. This has the appearance of a hemangioma on previous imaging. The liver is otherwise unremarkable. The contour of the liver is smooth and not nodular. The patient is status post cholecystectomy. Pancreas: Unremarkable. No pancreatic ductal dilatation or surrounding inflammatory changes. Spleen: Normal in size without focal abnormality. Adrenals/Urinary Tract: Adrenal glands are unremarkable. Kidneys are normal, without renal calculi, focal lesion, or hydronephrosis. Stomach/Bowel: The stomach and small bowel are normal without identified obstruction. The colon is decompressed but does contain contrast. No obvious colonic abnormality. The cecum and appendix, if the patient has 1, are below today's film. Vascular/Lymphatic: Atherosclerotic changes in the nonaneurysmal aorta. Probable shotty and mildly enlarged retroperitoneal nodes, poorly evaluated due to lack of contrast and lack of intra-abdominal fat. An apparent left periaortic node on image 35 measures 15 mm in short axis. Other: Diffuse ascites of uncertain etiology. Lipoma in the right abdominal wall as seen on series 3, image 34. No other abdominal wall mass. Tiny periumbilical hernia. No other hernia  is noted. No rectus muscle hematoma identified. Musculoskeletal: No acute or significant osseous findings. IMPRESSION: 1. Diffuse ascites of uncertain etiology. 2. Suggested retroperitoneal adenopathy, poorly evaluated due to lack of contrast and lack of intra-abdominal fat. The nodes are nonspecific but could be reactive or neoplastic. Recommend follow-up after resolution of the patient's ascites for better evaluation. 3. Tiny pleural effusions and mild bibasilar atelectasis. 4. Atherosclerotic changes in the nonaneurysmal aorta. 5. Right abdominal wall lipoma. 6. Tiny periumbilical fat containing hernia. Electronically Signed   By: Dorise Bullion III M.D   On: 03/03/2018 18:17   Dg Chest 2 View  Result Date: 03/03/2018 CLINICAL DATA:  Unwitnessed fall.  Hypertension.  EXAM: CHEST - 2 VIEW COMPARISON:  August 09, 2017 FINDINGS: There is atelectatic change in the right mid lower lung zones. There is no edema or consolidation. Heart is borderline enlarged with pulmonary vascularity normal. Aorta is prominent and rather tortuous, stable. There are metallic fragments in the left hemithorax. No pneumothorax. No bone lesions. IMPRESSION: No edema or consolidation. Areas of mild atelectatic change on the right. Stable cardiac silhouette. Aortic prominence and tortuosity likely reflect chronic hypertensive change. Electronically Signed   By: Lowella Grip III M.D.   On: 03/03/2018 11:44   Ct Head Wo Contrast  Result Date: 03/03/2018 CLINICAL DATA:  Unwitnessed fall EXAM: CT HEAD WITHOUT CONTRAST TECHNIQUE: Contiguous axial images were obtained from the base of the skull through the vertex without intravenous contrast. COMPARISON:  Head CT August 09, 2017 and brain MRI February 08, 2018 FINDINGS: Brain: There is moderate diffuse atrophy. There is no intracranial mass, hemorrhage, extra-axial fluid collection, or midline shift. There is evidence of a prior infarct involving much of the left occipital lobe, stable.  There is evidence of a prior infarct in the mid right occipital lobe, stable. There is evidence of a prior infarct at the gray-white junction of the anterior right parietal lobe, stable. Elsewhere there is patchy small vessel disease in the centra semiovale bilaterally. There is no acute appearing infarct. Basal ganglia calcification bilaterally is a physiologic finding. A small calcification in the mid right cerebellum may represent a small granuloma. This finding was present on prior study. Vascular: There is no hyperdense vessel. There is calcification in the left vertebral artery as well as in both carotid siphon regions. Skull: Bony calvarium appears intact. There is a right parietal scalp hematoma. Sinuses/Orbits: There is opacification in a posterior ethmoid air cell. There is mucosal thickening in several ethmoid air cells bilaterally. There is inward bowing of each medial orbital wall, a finding that may be congenital or may be due to previous trauma. This finding is stable. No intraorbital lesions are evident. Other: Mastoid air cells are clear. IMPRESSION: 1. Right parietal scalp hematoma with underlying bony calvarium intact. 2. Atrophy with supratentorial small vessel disease. Prior infarcts as noted, largest in the occipital lobes. 3.  No mass or hemorrhage.  No extra-axial fluid collection. 4.  Foci of arterial vascular calcification. 5.  Ethmoid sinus disease noted. Electronically Signed   By: Lowella Grip III M.D.   On: 03/03/2018 11:21   Mr Brain Wo Contrast  Result Date: 02/08/2018  [facility] [address] [place], [place] [address] [phone] NEUROIMAGING REPORT STUDY DATE: 02/08/2018 PATIENT NAME: ASHVIN ADELSON DOB: 1929/09/19 MRN: [id number] EXAM: MRI Brain without contrast ORDERING CLINICIAN: Venancio Poisson, NP CLINICAL HISTORY: 84 year old man with history of stroke and neurologic symptoms COMPARISON FILMS: MRI 08/10/2017 TECHNIQUE: MRI of the brain  without contrast was obtained utilizing 5 mm axial slices with T1, T2, T2 flair, SWI and diffusion weighted views.  T1 sagittal and T2 coronal views were obtained. CONTRAST: none IMAGING SITE: [facility], [place], [place] FINDINGS: On sagittal images, the spinal cord is imaged caudally to C4 and is normal in caliber.  There are degenerative changes at C3-C4.  The contents of the posterior fossa are of normal size and position.   The pituitary gland and optic chiasm appear normal.    There is mild to moderate generalized cortical atrophy mild corpus callosal atrophy..  There are no abnormal extra-axial collections of fluid.  The cerebellum and brainstem  appears normal.  T1 hyperintense focus posteriorly in the pons on the T1 sagittal images not seen on any of the other sequences and likely representing artifact.  The deep gray matter appears normal.  Remote stroke in the MCA distribution on the left predominantly in the posterior temporal lobe and adjacent parietal lobe. Susceptibility weighted images show some microhemorrhages within the left temporoparietal stroke.   Of note, the stroke was acute on the 08/10/2017 MRI and it has shown expected evolution over time.  There is also a remote right parietal stroke unchanged compared to the previous MRI.  There are scattered T2/FLAIR hypertense foci consistent with chronic microvascular ischemic change.  The current diffusion-weighted images are normal. The VIIth/VIIIth nerve complex appears normal.  There are bilateral lens replacements.  The orbits are otherwise normal.  The mastoid air cells appear normal.  The paranasal sinuses appear normal.  Flow voids are identified within the major intracerebral arteries.     This MRI of the brain without contrast shows the following: 1.    Expected evolution of the temporal parietal stroke that was acute on the 08/10/2017 MRI.   There is a mild amount of chronic microhemorrhage noted 2.    Remote right  parietal stroke and chronic microvascular ischemic change. 3.    There are no acute findings. INTERPRETING PHYSICIAN: Richard A. Felecia Shelling, MD, PhD, FAAN Certified in  Neuroimaging by [employer]   US Renal  Result Date: 03/05/2018 CLINICAL DATA:  Acute tubular necrosis. EXAM: RENAL / URINARY TRACT ULTRASOUND COMPLETE COMPARISON:  CT 2 days ago. FINDINGS: Right Kidney: Length: 11.3 cm. Echogenicity within normal limits. No mass or hydronephrosis visualized. Left Kidney: Length: 12.0 cm. Echogenicity within normal limits. No mass or hydronephrosis visualized. Bladder: Urine is present within the bladder.  No ureteral jet is visualized. There is ascites diffusely distributed. IMPRESSION: Kidneys appear normal by sonography. Normal size and echogenicity. No obstruction. Urine in the bladder. No ureteral jet is identified however. Diffuse ascites. Electronically Signed   By: Nelson Chimes M.D.   On: 03/05/2018 09:27   Ir Paracentesis  Result Date: 03/05/2018 INDICATION: Patient with history of congestive heart failure, stroke, malnutrition, acute kidney injury, suggested retroperitoneal adenopathy on CT, ascites. Request made for diagnostic and therapeutic paracentesis. EXAM: ULTRASOUND GUIDED DIAGNOSTIC AND THERAPEUTIC PARACENTESIS MEDICATIONS: None COMPLICATIONS: None immediate. PROCEDURE: Informed written consent was obtained from the patient after a discussion of the risks, benefits and alternatives to treatment. A timeout was performed prior to the initiation of the procedure. Initial ultrasound scanning demonstrates a moderate amount of ascites within the left mid to lower abdominal quadrant. The left mid to lower abdomen was prepped and draped in the usual sterile fashion. 2% lidocaine was used for local anesthesia. Following this, a 19 gauge, 7-cm, Yueh catheter was introduced. An ultrasound image was saved for documentation purposes. The paracentesis was performed. The catheter was  removed and a dressing was applied. The patient tolerated the procedure well without immediate post procedural complication. FINDINGS: A total of approximately 2.8 liters of yellow fluid was removed. Samples were sent to the laboratory as requested by the clinical team. IMPRESSION: Successful ultrasound-guided diagnostic and therapeutic paracentesis yielding 2.8 liters of peritoneal fluid. Read by: Rowe Ranell, PA-C Electronically Signed   By: Sandi Mariscal M.D.   On: 03/05/2018 14:30    Time Spent in minutes  25   Treveon Bourcier M.D on 03/07/2018 at 10:36 AM  Between 7am to 7pm - Pager - [phone]  After 7pm go to [email or web address] - password [facility]  [facility] -  Office  [phone]

## 2018-03-07 NOTE — Anesthesia Preprocedure Evaluation (Signed)
Anesthesia Evaluation  Patient identified by MRN, date of birth, ID band Patient awake    Reviewed: Allergy & Precautions, NPO status , Patient's Chart, lab work & pertinent test results  Airway Mallampati: II  TM Distance: >3 FB     Dental  (+) Poor Dentition   Pulmonary former smoker,    breath sounds clear to auscultation       Cardiovascular hypertension,  Rhythm:Irregular Rate:Normal     Neuro/Psych    GI/Hepatic   Endo/Other    Renal/GU      Musculoskeletal   Abdominal   Peds  Hematology   Anesthesia Other Findings   Reproductive/Obstetrics                             Anesthesia Physical Anesthesia Plan  ASA: III  Anesthesia Plan: MAC   Post-op Pain Management:    Induction: Intravenous  PONV Risk Score and Plan:   Airway Management Planned: Natural Airway and Nasal Cannula  Additional Equipment:   Intra-op Plan:   Post-operative Plan:   Informed Consent: I have reviewed the patients History and Physical, chart, labs and discussed the procedure including the risks, benefits and alternatives for the proposed anesthesia with the patient or authorized representative who has indicated his/her understanding and acceptance.   Dental advisory given  Plan Discussed with: CRNA and Anesthesiologist  Anesthesia Plan Comments:         Anesthesia Quick Evaluation

## 2018-03-07 NOTE — Progress Notes (Addendum)
Progress Note  Patient Name: [name] Date of Encounter: 03/07/2018  Primary Cardiologist: Pixie Casino, MD   Subjective   Mr. Awbrey denies any chest discomfort, shortness of breath or palpitations.  He is lying almost flat comfortably.  Inpatient Medications    Scheduled Meds: . atorvastatin  20 mg Oral QHS  . cholecalciferol  1,000 Units Oral Daily  . feeding supplement (ENSURE ENLIVE)  237 mL Oral TID BM  . metoprolol tartrate  12.5 mg Oral BID  . mirtazapine  7.5 mg Oral QHS  . multivitamin with minerals  1 tablet Oral Daily  . pantoprazole  40 mg Oral BID  . polyethylene glycol  17 g Oral Daily  . senna-docusate  2 tablet Oral BID  . sertraline  50 mg Oral Daily  . sodium chloride flush  3 mL Intravenous Q12H   Continuous Infusions: . dextrose 5 % and 0.9% NaCl     PRN Meds: acetaminophen, bisacodyl, lidocaine, ondansetron **OR** ondansetron (ZOFRAN) IV, senna-docusate   Vital Signs    Vitals:   03/07/18 1034 03/07/18 1227 03/07/18 1240 03/07/18 1324  BP: (!) 144/65 117/66 (!) 129/58 128/69  Pulse: 88   85  Resp: (!) 21 17 (!) 21 18  Temp: 98.1 F (36.7 C) 99 F (37.2 C)  98.8 F (37.1 C)  TempSrc: Oral Oral  Oral  SpO2: 97% 98% 98% 98%  Weight:      Height:        Intake/Output Summary (Last 24 hours) at 03/07/2018 1447 Last data filed at 03/07/2018 1216 Gross per 24 hour  Intake 3840 ml  Output -  Net 3840 ml   Filed Weights   03/05/18 0507 03/06/18 0536 03/07/18 0459  Weight: 67.6 kg 73.9 kg 70.5 kg    Telemetry    Sinus rhythm with frequent PACs in the 80s- Personally Reviewed  ECG    No new tracings- Personally Reviewed  Physical Exam   GEN: No acute distress.   Neck: No JVD Cardiac:  The rate with frequent early beats, 1/6 systolic murmur at LUSB Respiratory: Clear to auscultation bilaterally. GI: Soft, nontender, non-distended  MS: No edema; No deformity. Neuro:  Nonfocal  Psych: Normal affect   Labs     Chemistry Recent Labs  Lab 03/03/18 0919  03/05/18 0419 03/06/18 0448 03/07/18 0534  NA 138   < > 138 142 139  K 4.9   < > 3.7 4.3 4.2  CL 103   < > 111 111 108  CO2 25   < > 24 23 25   GLUCOSE 97   < > 105* 80 89  BUN 30*   < > 26* 23 22  CREATININE 2.34*   < > 2.14* 1.72* 1.52*  CALCIUM 8.3*   < > 7.4* 8.1* 8.2*  PROT 4.9*  --   --   --   --   ALBUMIN 2.3*  --   --   --   --   AST 19  --   --   --   --   ALT 9  --   --   --   --   ALKPHOS 98  --   --   --   --   BILITOT 1.0  --   --   --   --   GFRNONAA 24*   < > 26* 34* 40*  GFRAA 27*   < > 30* 40* 46*  ANIONGAP 10   < > 3*  8 6   < > = values in this interval not displayed.     Hematology Recent Labs  Lab 03/05/18 0419 03/06/18 0448 03/07/18 0534  WBC 3.8* 4.5 6.0  RBC 2.99* 3.32* 3.60*  HGB 8.2* 9.2* 9.9*  HCT 27.8* 31.1* 33.4*  MCV 93.0 93.7 92.8  MCH 27.4 27.7 27.5  MCHC 29.5* 29.6* 29.6*  RDW 15.2 15.1 15.3  PLT 362 396 454*    Cardiac Enzymes Recent Labs  Lab 03/03/18 1416 03/03/18 2022  TROPONINI <0.03 <0.03    Recent Labs  Lab 03/03/18 1009  TROPIPOC 0.00     BNPNo results for input(s): BNP, PROBNP in the last 168 hours.   DDimer No results for input(s): DDIMER in the last 168 hours.   Radiology    No results found.  Cardiac Studies   Echo 03/04/18 Study Conclusions - Left ventricle: Difficult study Frequent ectopy. LVEF is approximately 35% with severe hypokinesis of the inferior, distal anterior and apical walls; hypokinesis of the inferoseptal wall. The cavity size was normal. Wall thickness was increased in a pattern of mild LVH. Doppler parameters are consistent with abnormal left ventricular relaxation (grade 1 diastolic dysfunction). - Aortic valve: There was trivial regurgitation. Valve area (Vmax): 2.59 cm^2. - Left atrium: The atrium was moderately dilated. - Pulmonary arteries: PA peak pressure: 34 mm Hg (S). - Pericardium, extracardiac: There was a  right pleural effusion.  Patient Profile     84 y.o. male with a hx of HTN, PAD, CVA, dementia admitted after found down at his facility with hypotensionwho is being seen today for the evaluation of CHF  Assessment & Plan    1. Syncope: -Likely in the setting of hypotension versus AF vs anemia, pt with no recollection of events  -Last wore monitor earlier this year which was found to be unrevealing -Troponin negative x3 with no ACS symptoms  -No recurrence since admission  -Consider event monitor at DC -BP stabilized  2. 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  -Would need input from GI as to safety and timing of anticoagulation related to their findings. -Continue low dose BB for rate control   3. Acute systolic CHF: -Echocardiogram with LVEF of 35% with diffuse hypokinesis of the inferoseptal wall and G1DD>>last echo in 08/2017 with LV of 60-65% -New drop in LV function>>>ischemia versus AF induced>>no ACS symptoms  -Last nuclear study in 2014 and 2018 with no inducible ischemia -Lopressor 12.5 mg initiated 03/06/18, No ACE-I started yet due to renal funciton. No ASA given concern for GI bleed  -Newly decreased LV function could be related to acute illness with anemia and afib.  -With his renal function much improved, could now consider diagnostic cath to evaluate coronary arteries.  Will discuss with attending for timing of ischemic evaluation and consideration of possible need for abdominal surgery.  4. Ascites: -Unknown etiology  -s/p paracentesis on 03/05/18 with 2.8L removed  -Per primary team. ? If related to gastric tumor.  5. AKI:   -Creatinine, continues to improve 2.34>2.14>1.72>1.52 -Possibly due to hypoperfusion related to anemia, now improved with increase in hemoglobin after transfusion -Baseline appears to be in the 0.8-0.9 range  -Avoid nephrotoxic medications and monitor with daily  BMET -Per primary team    6. GI bleed/Anemia: -Got PRBCs, Hgb stable at 9.9 today -Stools heme +  -GI consulted.  Pt underwent EGD today with finding of likely malignant gastric tumor in the region of the stomach and complete gastric outlet obstruction.      For questions or updates, please contact [place] Please consult [email or web address] for contact info under        Signed, Daune Perch, NP  03/07/2018, 2:47 PM    ------------------------------------------------------------------------------   History and all data above reviewed.  Patient examined.  I agree with the findings as above.  Nyle B Peach went for colonoscopy and EGD today.  Constitutional: No acute distress ENMT:poordentition, moist mucous membranes Cardiovascular:irregularrhythm, normal rate, no murmurs. S1 and S2 normal. No jugular venous distention.  Respiratory: clear to auscultation bilaterally GI : normal bowel sounds, soft and nontender.Distended but not tense.  MSK: extremities warm, well perfused.Traceedema.  NEURO: grossly nonfocal exam, moves all extremities. PSYCH: alert and oriented x 3, normal mood and affect.   All available labs, radiology testing, previous records reviewed. Agree with documented assessment and plan of my colleague as stated above with the following additions or changes:  Principal Problem:   Syncope and collapse Active Problems:   History of BPH   Right upper quadrant abdominal mass   Essential hypertension   PAD (peripheral artery disease) (HCC)   Dyslipidemia   Protein-calorie malnutrition (HCC)   Vascular dementia without behavioral disturbance (HCC)   Constipation   AKI (acute kidney injury)  ([place])   Anemia   Malnutrition of moderate degree   Acute systolic heart failure (HCC)   Paroxysmal atrial fibrillation ([place])    Plan: Creatinine is improving, but EGD revealed concerning gastric mass. At this time, no changes to CV medications, on metoprolol for rate control of PAF. Remainder per N. Phylliss Bob NP as noted above.  Elouise Munroe, MD [facility] 11:45 PM  03/07/2018

## 2018-03-07 NOTE — Transfer of Care (Signed)
Immediate Anesthesia Transfer of Care Note  Patient: [name]  Procedure(s) Performed: ESOPHAGOGASTRODUODENOSCOPY (EGD) WITH PROPOFOL (N/A ) RECTAL EXAM UNDER ANESTHESIA BIOPSY  Patient Location: PACU  Anesthesia Type:MAC  Level of Consciousness: awake, alert  and oriented  Airway & Oxygen Therapy: Patient Spontanous Breathing and Patient connected to nasal cannula oxygen  Post-op Assessment: Report given to RN, Post -op Vital signs reviewed and stable and Patient moving all extremities  Post vital signs: Reviewed and stable  Last Vitals:  Vitals Value Taken Time  BP    Temp    Pulse    Resp    SpO2      Last Pain:  Vitals:   03/07/18 1034  TempSrc: Oral  PainSc: 0-No pain         Complications: No apparent anesthesia complications

## 2018-03-07 NOTE — Plan of Care (Signed)
  Problem: Education: Goal: Knowledge of General Education information will improve Description: Including pain rating scale, medication(s)/side effects and non-pharmacologic comfort measures Outcome: Progressing   Problem: Activity: Goal: Risk for activity intolerance will decrease Outcome: Progressing   

## 2018-03-07 NOTE — Anesthesia Postprocedure Evaluation (Signed)
Anesthesia Post Note  Patient: [name]  Procedure(s) Performed: ESOPHAGOGASTRODUODENOSCOPY (EGD) WITH PROPOFOL (N/A ) RECTAL EXAM UNDER ANESTHESIA BIOPSY     Patient location during evaluation: PACU Anesthesia Type: MAC Level of consciousness: awake and alert Pain management: pain level controlled Vital Signs Assessment: post-procedure vital signs reviewed and stable Respiratory status: spontaneous breathing, nonlabored ventilation, respiratory function stable and patient connected to nasal cannula oxygen Cardiovascular status: stable and blood pressure returned to baseline Postop Assessment: no apparent nausea or vomiting Anesthetic complications: no    Last Vitals:  Vitals:   03/07/18 1550 03/07/18 2041  BP: 137/65 130/77  Pulse: 84 83  Resp: 16 18  Temp: 37.6 C 37.2 C  SpO2: 99% 96%    Last Pain:  Vitals:   03/07/18 2041  TempSrc: Oral  PainSc:                  Calin Ellery COKER

## 2018-03-07 NOTE — Interval H&P Note (Signed)
History and Physical Interval Note:  03/07/2018 11:47 AM  [name]  has presented today for surgery, with the diagnosis of gibleed  The various methods of treatment have been discussed with the patient and family. After consideration of risks, benefits and other options for treatment, the patient has consented to  Procedure(s): COLONOSCOPY WITH PROPOFOL (N/A) ESOPHAGOGASTRODUODENOSCOPY (EGD) WITH PROPOFOL (N/A) as a surgical intervention .  The patient's history has been reviewed, patient examined, no change in status, stable for surgery.  I have reviewed the patient's chart and labs.  Questions were answered to the patient's satisfaction.     Nancy Fetter

## 2018-03-08 LAB — GLUCOSE, CAPILLARY: Glucose-Capillary: 85 mg/dL (ref 70–99)

## 2018-03-08 LAB — RENAL FUNCTION PANEL
Albumin: 2 g/dL — ABNORMAL LOW (ref 3.5–5.0)
Anion gap: 6 (ref 5–15)
BUN: 18 mg/dL (ref 8–23)
CO2: 24 mmol/L (ref 22–32)
Calcium: 8.2 mg/dL — ABNORMAL LOW (ref 8.9–10.3)
Chloride: 112 mmol/L — ABNORMAL HIGH (ref 98–111)
Creatinine, Ser: 1.27 mg/dL — ABNORMAL HIGH (ref 0.61–1.24)
GFR calc Af Amer: 57 mL/min — ABNORMAL LOW (ref >60)
GFR calc non Af Amer: 49 mL/min — ABNORMAL LOW (ref >60)
Glucose, Bld: 84 mg/dL (ref 70–99)
Phosphorus: 3.6 mg/dL (ref 2.5–4.6)
Potassium: 4.1 mmol/L (ref 3.5–5.1)
Sodium: 142 mmol/L (ref 135–145)

## 2018-03-08 LAB — HEMOGLOBIN AND HEMATOCRIT, BLOOD
HCT: 30.1 % — ABNORMAL LOW (ref 39.0–52.0)
Hemoglobin: 9 g/dL — ABNORMAL LOW (ref 13.0–17.0)

## 2018-03-08 LAB — MAGNESIUM: Magnesium: 1.7 mg/dL (ref 1.7–2.4)

## 2018-03-08 MED ORDER — METOPROLOL TARTRATE 5 MG/5ML IV SOLN: 5.0000 mg | INTRAVENOUS | Status: DC | PRN

## 2018-03-08 NOTE — Progress Notes (Signed)
Assumed care from previous RN

## 2018-03-08 NOTE — Progress Notes (Signed)
Nutrition Follow-up  DOCUMENTATION CODES:   Non-severe (moderate) malnutrition in context of chronic illness  INTERVENTION:   -Once diet is advanced, resume:  -Ensure Enlive po TID, each supplement provides 350 kcal and 20 grams of protein -MVI with minerals daily  NUTRITION DIAGNOSIS:   Moderate Malnutrition related to chronic illness(CVA, dementia) as evidenced by energy intake < or equal to 75% for > or equal to 1 month, mild fat depletion, moderate fat depletion, mild muscle depletion, moderate muscle depletion.  Ongoing  GOAL:   Patient will meet greater than or equal to 90% of their needs  Progressing  MONITOR:   PO intake, Supplement acceptance, Labs, Weight trends, Skin, I & O's  REASON FOR ASSESSMENT:   Consult Assessment of nutrition requirement/status  ASSESSMENT:   [name] is a 84 y.o. male with medical history significant for HTN, PAD, CVA, dementia, HLD, BPH, who was brought to the ED via EMS this morning after being found down at his facility.  10/1- s/p paracentesis (2.8 L removed) 10/3- s/p upper endo with biopsy (revealed food found in esophagus and stomach; mass found in pre-pyloric region of stomach, which was biopsied)  Pt resting in recliner chair at time of visit, RD did not disturb.   Prior to NPO status, appetite had improved. Noted meal completion 75-100% and was consuming Ensure supplements.   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 Per MD notes, suggest progressive cancer with gastric primary. Plan for palliative care discussion with family regarding goals of care   Labs reviewed: CBGS: 85-97.   Diet Order:   Diet Order            Diet NPO time specified  Diet effective midnight              EDUCATION NEEDS:   Education needs have been addressed  Skin:  Skin Assessment: Skin  Integrity Issues: Skin Integrity Issues:: Other (Comment) Other: rt posterior head laceration  Last BM:  03/08/18  Height:   Ht Readings from Last 1 Encounters:  03/05/18 5\' 8"  (1.727 m)    Weight:   Wt Readings from Last 1 Encounters:  03/08/18 71.2 kg    Ideal Body Weight:  70 kg  BMI:  Body mass index is 23.87 kg/m.  Estimated Nutritional Needs:   Kcal:  1800-2000  Protein:  90-105 grams  Fluid:  1.8-2.0 L    Rosalyn Archambault A. Jimmye Norman, RD, LDN, CDE Pager: [phone] After hours Pager: [phone]

## 2018-03-08 NOTE — Progress Notes (Signed)
[facility] PROGRESS NOTE Subjective Patient stable during the night.  It is notable that he has had a CT scan of his abdomen but this was done without contrast due to his elevated creatinine.  Objective: Vital signs in last 24 hours: Temp:  [98.1 F (36.7 C)-99.6 F (37.6 C)] 98.6 F (37 C) (10/04 0558) Pulse Rate:  [80-88] 80 (10/04 0558) Resp:  [16-21] 18 (10/04 0558) BP: (117-144)/(55-77) 126/55 (10/04 0558) SpO2:  [96 %-100 %] 100 % (10/04 0558) Weight:  [71.2 kg] 71.2 kg (10/04 0500) Last BM Date: 03/08/18  Intake/Output from previous day: 10/03 0701 - 10/04 0700 In: 705 [P.O.:50; I.V.:655] Out: 0  Intake/Output this shift: No intake/output data recorded.  PE: General--sleeping and difficult to arouse  Abdomen--minimally distended nontender  Lab Results: Recent Labs    03/06/18 0448 03/07/18 0534 03/08/18 0520  WBC 4.5 6.0  --   HGB 9.2* 9.9* 9.0*  HCT 31.1* 33.4* 30.1*  PLT 396 454*  --    BMET Recent Labs    03/06/18 0448 03/07/18 0534 03/08/18 0523  NA 142 139 142  K 4.3 4.2 4.1  CL 111 108 112*  CO2 23 25 24   CREATININE 1.72* 1.52* 1.27*   LFT No results for input(s): PROT, AST, ALT, ALKPHOS, BILITOT, BILIDIR, IBILI in the last 72 hours. PT/INR No results for input(s): LABPROT, INR in the last 72 hours. PANCREAS No results for input(s): LIPASE in the last 72 hours.       Studies/Results: No results found.  Medications: I have reviewed the patient's current medications.  Assessment:   1.  Concentric gastric mass with gastric outlet obstruction.  Biopsies pending but this is almost certainly a gastric cancer.  It is notable that the CT scan that he had done recently was done without contrast.  Cytology on the ascitic fluid is still pending.  I think the biggest issue now is whether this is likely to be treatable or if palliative care should be involved.  After the path returns, suggest either oncology or palliative care consult  to discuss with the family.  If more information needed CT with contrast, PET scan etc. may need to be done. We will check on path this weekend.       Nancy Fetter 03/08/2018, 8:01 AM  This note was created using voice recognition software. Minor errors may Have occurred unintentionally.  Pager: [phone] If no answer or after hours call [phone]

## 2018-03-08 NOTE — Progress Notes (Signed)
Pt's emergency contact Terrilyn Saver) called for an update for pt's care. Provider on-call was notified who suggested that the contact person calls back in am when attending is here. The information was relayed to the contact person for follow up in AM.

## 2018-03-08 NOTE — Progress Notes (Signed)
Physical Therapy Treatment Patient Details Name: [name] MRN: [id number] DOB: Jan 01, 1930 Today's Date: 03/08/2018    History of Present Illness 84yo male brought to the ED by EMS from his facility, [facility], after being found down at the facility. Appeared to have syncopal episode in bathroom. CT head negative for acute changes. Pt found to have gastric mass. PMH exertional dyspnea, HTN, PAD, CVA, cardiac cath     PT Comments    Pt with initial flat affect during session, midway through session pt apologized for being "stubborn" and discussed discomfort from hunger. Pt pleasant remainder of session showing good motivation with ambulation progressing from min guard to supervision without AD. Pt will continue to benefit acutely from PT for increased independence.     Follow Up Recommendations  Other (comment)(return to [place] and get PT there )     Equipment Recommendations  None recommended by PT    Recommendations for Other Services       Precautions / Restrictions Precautions Precautions: Fall Restrictions Weight Bearing Restrictions: No    Mobility  Bed Mobility Overal bed mobility: Modified Independent Bed Mobility: Supine to Sit           General bed mobility comments: increased time, use of rail to rise.   Transfers Overall transfer level: Needs assistance Equipment used: None Transfers: Sit to/from Stand Sit to Stand: Min assist         General transfer comment: slight assist to achive final extension in rise, without AD   Ambulation/Gait Ambulation/Gait assistance: Min guard Gait Distance (Feet): 275 Feet Assistive device: None Gait Pattern/deviations: Narrow base of support;Scissoring Gait velocity: decreased  Gait velocity interpretation: 1.31 - 2.62 ft/sec, indicative of limited community ambulator General Gait Details: supervision with intermittent min guard without AD, pt denied using RW but uses one at home. Pt with  scisorring initial 100 ft with cues for feet wider apart, pt able to correct with steady balance through remaining 144ft    Stairs             Wheelchair Mobility    Modified Rankin (Stroke Patients Only)       Balance Overall balance assessment: Needs assistance   Sitting balance-Leahy Scale: Fair       Standing balance-Leahy Scale: Fair Standing balance comment: pt able to static stand iwthout support and wide BOS, unable to get feet together to test rhomberg without loosing balance.                             Cognition Arousal/Alertness: Awake/alert Behavior During Therapy: WFL for tasks assessed/performed Overall Cognitive Status: Within Functional Limits for tasks assessed                                        Exercises      General Comments        Pertinent Vitals/Pain Pain Assessment: No/denies pain    Home Living                      Prior Function            PT Goals (current goals can now be found in the care plan section) Progress towards PT goals: Progressing toward goals    Frequency    Min 3X/week      PT Plan Current  plan remains appropriate    Co-evaluation              AM-PAC PT "6 Clicks" Daily Activity  Outcome Measure  Difficulty turning over in bed (including adjusting bedclothes, sheets and blankets)?: None Difficulty moving from lying on back to sitting on the side of the bed? : A Little Difficulty sitting down on and standing up from a chair with arms (e.g., wheelchair, bedside commode, etc,.)?: Unable Help needed moving to and from a bed to chair (including a wheelchair)?: A Little Help needed walking in hospital room?: A Little Help needed climbing 3-5 steps with a railing? : A Little 6 Click Score: 17    End of Session Equipment Utilized During Treatment: Gait belt Activity Tolerance: Patient tolerated treatment well Patient left: in chair;with chair alarm set;with  call bell/phone within reach Nurse Communication: Mobility status;Other (comment)( pt request for update on NPO status ) PT Visit Diagnosis: Muscle weakness (generalized) (M62.81);History of falling (Z91.81)     Time: 3716-9678 PT Time Calculation (min) (ACUTE ONLY): 20 min  Charges:  $Gait Training: 8-22 mins                     Samuella Bruin, [place]  [facility] [phone]    Samuella Bruin 03/08/2018, 10:58 AM

## 2018-03-08 NOTE — Progress Notes (Signed)
Called from pathology.  The results of the cytology on ascitic fluid return showing malignant cells with the markers most consistent with urethral etiology.  Dr. Saralyn Pilar call me today and the biopsies of the gastric mass show poorly differentiated adenocarcinoma.   At this point the patient clearly has progressive cancer suspect gastric is a primary but is already in the ascitic fluid.  Would consider having him seen by palliative care to meet with he and the family.  We will be available as needed.

## 2018-03-08 NOTE — Clinical Social Work Note (Signed)
CSW continues to follow for discharge needs.  Margie Brink, CSW [phone]  

## 2018-03-08 NOTE — Progress Notes (Signed)
PROGRESS NOTE                                                                                                                                                                                                             Patient Demographics:    [name], is a 84 y.o. male, DOB - Dec 18, 1929, QKM:[id number]  Admit date - 03/03/2018   Admitting Physician Janora Norlander, MD  Outpatient Primary MD for the patient is Patient, [name]  LOS - 5  Outpatient Specialists: none  Chief Complaint  Patient presents with  . Fall       Brief Narrative  84 year old male with history of hypertension, CVA (left inferior MCA infarct in March this year), mild dementia, hyperlipidemia, PAD, BPH resident of [facility] brought to ED by EMS after found on the floor of his facility.  Patient says he may have slipped on something but does not recall and thinks he passed out.  Patient complained of some abdominal discomfort and was trying to strain in the bathroom when he passed out.  Reportedly his systolic blood pressure was in the 80s when he was found. Patient reports being constipated frequently for the past 3 weeks associated with poor p.o. intake, significant weight loss and right-sided abdominal pain with occasional vomiting.  No fevers or chills. In the ED vitals were stable.  EKG showed irregular rhythm with P waves and dropped QRS complexes.  Noted for 450 cc urine in his bladder scan and was unable to urinate.  Blood work showed 2 g drop in H&H with positive Hemoccult, acute kidney injury.  Head CT was negative for acute findings.  03/08/2018: Patient underwent EGD on 03/07/2018.  EGD revealed "Food in the mid esophagus.  A small amount of food (residue) in the stomach.  Likely malignant gastric tumor in the prepyloric region of the stomach". Gastric tomorrow was biopsied.  In essence, patient is documented to have a complete  gastric outlet obstruction.    As per the GI notes, pathology result is said to reveal adenocarcinoma.  There is also documentation that there is likely seeding of adenocarcinoma into the ascitic fluid.  Will consult palliative care team to assist in defining goals of care.  Further management will depend on hospital course.    Subjective:   Patient is a poor  historian.  Patient is tells me that he fell and has some scalp injury.  Further history obtained from the patient.  Assessment  & Plan :    Principal Problem: Gastric adenocarcinoma, metastatic: GI input is highly appreciated. Consult palliative care team to assist in defining goals of care. Further management will depend on above.  syncope and collapse Likely multifactorial.  Dehydration with overlap of new onset A. fib and cardiomyopathy.   Patient is also chronically ill looking and debilitated.   Blood pressure remained stable.    Active Problems:  AKI (acute kidney injury) ([place]) AKI is resolving Serum creatinine is 1.27 today (down from 2.23) Likely prerenal. Continue to avoid nephrotoxins.   Continue to hold ACE inhibitor.   Strict I's/O and daily weight.   Renal ultrasound without any findings of medical renal disease or obstruction.  .  Abdominal ascites Likely malignant ascites.   Patient is status post paracentesis with 2.8 L yellow fluid removed on 10/1.   Negative for SBP.   SAAG of 0.7 ruling out the possibility of portal hypertension.   Fluid cytology is said to be positive.    Acute combined systolic and diastolic CHF/?  Nonischemic cardiomyopathy New finding.  2D echo in March this year showed normal EF.  Holding aspirin for concern of GI bleed.  Continue statin.  Holding ACE inhibitor. Plan to add Lasix as renal function permits.  Cardiology consult appreciated.  Guarded prognosis.   Iron deficiency anemia/?  Acute blood loss anemia. As almost 4 g drop in hemoglobin from baseline.  Found to be iron  deficient.  Hemoccult positive.  GI consult appreciated.  Continue PPI.  Hemoglobin has remained stable in past 2 days.  EGD is as documented above.    Paroxysmal A. fib New finding this admission.  Patient had an event monitor after CVA earlier this year which did not capture any A. fib.  His chads2vasc score is  7 and would need anticoagulation which will be determined following EGD and colonoscopy results.  Cardiology following.  Protein calorie malnutrition, moderate/failure to thrive: Palliative care input. Patient may be hospice appropriate. Nutrition consult appreciated.   Patient reports very poor p.o. intake.  Added supplement.   History of CVA Left inferior MCA infarct in March 2019.  Continue  Lipitor, hold aspirin with concern for GI bleed..  Recent follow-up MRI of the brain done 3 weeks back shows expected evolution of the stroke.  Needs anticoagulation for new onset A. fib and high CHADS2vasc.    Generalized weakness PT evaluation.  Reports using both a walker and cane at the facility.  Constipation Continue senna-Colace and MiraLAX.  History of dementia Mild at baseline.  No acute issues.  Code Status : DNR  Family Communication  :   Disposition Plan  : This will depend on goal of care in the hospital course.  Patient may be hospice appropriate.    Barriers For Discharge : Active symptoms  Consults  : [facility], IR, cardiology  Procedures  : CT head, CT abdomen without contrast, renal ultrasound, paracentesis, 2D echo, EGD and colonoscopy today.   DVT Prophylaxis  : SCDs Lab Results  Component Value Date   PLT 454 (H) 03/07/2018    Antibiotics  :    Anti-infectives (From admission, onward)   None        Objective:   Vitals:   03/08/18 0500 03/08/18 0558 03/08/18 1023 03/08/18 1208  BP:  (!) 126/55  119/66  Pulse:  80 84 80  Resp:  18  18  Temp:  98.6 F (37 C)  99.5 F (37.5 C)  TempSrc:  Oral  Oral  SpO2:  100% 96% 98%  Weight: 71.2 kg      Height:        Wt Readings from Last 3 Encounters:  03/08/18 71.2 kg  01/30/18 72.6 kg  01/07/18 72.6 kg     Intake/Output Summary (Last 24 hours) at 03/08/2018 1420 Last data filed at 03/08/2018 1300 Gross per 24 hour  Intake 645.04 ml  Output 0 ml  Net 645.04 ml   Physical exam Chronically ill looking  not in distress HEENT: Pallor present, temporal wasting, moist mucosa Chest: Clear bilaterally CVS: S1-S2. Musculoskeletal: Warm, no edema today, Abdomen is distended, soft and nontender.      Data Review:    CBC Recent Labs  Lab 03/03/18 0919 03/04/18 0609 03/05/18 0419 03/06/18 0448 03/07/18 0534 03/08/18 0520  WBC 6.2 4.9 3.8* 4.5 6.0  --   HGB 10.7* 9.4* 8.2* 9.2* 9.9* 9.0*  HCT 36.2* 32.0* 27.8* 31.1* 33.4* 30.1*  PLT 379 409* 362 396 454*  --   MCV 94.0 93.3 93.0 93.7 92.8  --   MCH 27.8 27.4 27.4 27.7 27.5  --   MCHC 29.6* 29.4* 29.5* 29.6* 29.6*  --   RDW 15.0 15.2 15.2 15.1 15.3  --   LYMPHSABS 1.1  --   --   --   --   --   MONOABS 0.4  --   --   --   --   --   EOSABS 0.1  --   --   --   --   --   BASOSABS 0.0  --   --   --   --   --     Chemistries  Recent Labs  Lab 03/03/18 0919 03/04/18 0609 03/05/18 0419 03/06/18 0448 03/07/18 0534 03/08/18 0523  NA 138 139 138 142 139 142  K 4.9 4.0 3.7 4.3 4.2 4.1  CL 103 106 111 111 108 112*  CO2 25 27 24 23 25 24   GLUCOSE 97 111* 105* 80 89 84  BUN 30* 29* 26* 23 22 18   CREATININE 2.34* 2.23* 2.14* 1.72* 1.52* 1.27*  CALCIUM 8.3* 8.2* 7.4* 8.1* 8.2* 8.2*  MG 1.8  --   --   --   --  1.7  AST 19  --   --   --   --   --   ALT 9  --   --   --   --   --   ALKPHOS 98  --   --   --   --   --   BILITOT 1.0  --   --   --   --   --    ------------------------------------------------------------------------------------------------------------------ No results for input(s): CHOL, HDL, LDLCALC, TRIG, CHOLHDL, LDLDIRECT in the last 72 hours.  Lab Results  Component Value Date   HGBA1C 5.2  08/10/2017   ------------------------------------------------------------------------------------------------------------------ No results for input(s): TSH, T4TOTAL, T3FREE, THYROIDAB in the last 72 hours.  Invalid input(s): FREET3 ------------------------------------------------------------------------------------------------------------------ No results for input(s): VITAMINB12, FOLATE, FERRITIN, TIBC, IRON, RETICCTPCT in the last 72 hours.  Coagulation profile No results for input(s): INR, PROTIME in the last 168 hours.  No results for input(s): DDIMER in the last 72 hours.  Cardiac Enzymes Recent Labs  Lab 03/03/18 1416 03/03/18 2022  TROPONINI <0.03 <0.03   ------------------------------------------------------------------------------------------------------------------ No results found for: BNP  Inpatient Medications  Scheduled Meds: . atorvastatin  20 mg Oral QHS  . cholecalciferol  1,000 Units Oral Daily  . feeding supplement (ENSURE ENLIVE)  237 mL Oral TID BM  . metoprolol tartrate  12.5 mg Oral BID  . mirtazapine  7.5 mg Oral QHS  . multivitamin with minerals  1 tablet Oral Daily  . pantoprazole  40 mg Oral BID  . polyethylene glycol  17 g Oral Daily  . senna-docusate  2 tablet Oral BID  . sertraline  50 mg Oral Daily  . sodium chloride flush  3 mL Intravenous Q12H   Continuous Infusions: . dextrose 5 % and 0.9% NaCl 0 mL/hr at 03/07/18 1649   PRN Meds:.acetaminophen, bisacodyl, lidocaine, ondansetron **OR** ondansetron (ZOFRAN) IV, senna-docusate  Micro Results Recent Results (from the past 240 hour(s))  MRSA PCR Screening     Status: None   Collection Time: 03/03/18  3:20 PM  Result Value Ref Range Status   MRSA by PCR NEGATIVE NEGATIVE Final    Comment:        The GeneXpert MRSA Assay (FDA approved for NASAL specimens only), is one component of a comprehensive MRSA colonization surveillance program. It is not intended to diagnose MRSA infection  nor to guide or monitor treatment for MRSA infections. Performed at [facility], [place] [address], [place], [place] [address]   Culture, body fluid-bottle     Status: None (Preliminary result)   Collection Time: 03/05/18  2:25 PM  Result Value Ref Range Status   Specimen Description PERITONEAL  Final   Special Requests NONE  Final   Culture   Final    NO GROWTH 3 DAYS Performed at [facility], [address], [place], [place] [address]    Report Status PENDING  Incomplete  Gram stain     Status: None   Collection Time: 03/05/18  2:25 PM  Result Value Ref Range Status   Specimen Description PERITONEAL  Final   Special Requests   Final    NONE Performed at [facility], [address], [place], [place] [address]    Gram Stain   Final    FEW WBC PRESENT,BOTH PMN AND MONONUCLEAR NO ORGANISMS SEEN    Report Status 03/05/2018 FINAL  Final    Radiology Reports Ct Abdomen Wo Contrast  Result Date: 03/03/2018 CLINICAL DATA:  Abdominal mass. Appears to be an abdominal wall hernia versus rectus sheath hematoma or mass. EXAM: CT ABDOMEN WITHOUT CONTRAST TECHNIQUE: Multidetector CT imaging of the abdomen was performed following the standard protocol without IV contrast. COMPARISON:  February 25, 2016 FINDINGS: Lower chest: Mild atelectasis in the bases.  Small effusions. Hepatobiliary: Pneumobilia is consistent with previous sphincterotomy, unchanged, in the left hepatic lobe. The patient's known left hepatic mass is not well assessed without contrast on today's study. This has the appearance of a hemangioma on previous imaging. The liver is otherwise unremarkable. The contour of the liver is smooth and not nodular. The patient is status post cholecystectomy. Pancreas: Unremarkable. No pancreatic ductal dilatation or surrounding inflammatory changes. Spleen: Normal in size without focal abnormality. Adrenals/Urinary Tract: Adrenal glands are unremarkable. Kidneys are normal,  without renal calculi, focal lesion, or hydronephrosis. Stomach/Bowel: The stomach and small bowel are normal without identified obstruction. The colon is decompressed but does contain contrast. No obvious colonic abnormality. The cecum and appendix, if the patient has 1, are below today's film. Vascular/Lymphatic: Atherosclerotic changes in the nonaneurysmal aorta. Probable shotty and mildly  enlarged retroperitoneal nodes, poorly evaluated due to lack of contrast and lack of intra-abdominal fat. An apparent left periaortic node on image 35 measures 15 mm in short axis. Other: Diffuse ascites of uncertain etiology. Lipoma in the right abdominal wall as seen on series 3, image 34. No other abdominal wall mass. Tiny periumbilical hernia. No other hernia is noted. No rectus muscle hematoma identified. Musculoskeletal: No acute or significant osseous findings. IMPRESSION: 1. Diffuse ascites of uncertain etiology. 2. Suggested retroperitoneal adenopathy, poorly evaluated due to lack of contrast and lack of intra-abdominal fat. The nodes are nonspecific but could be reactive or neoplastic. Recommend follow-up after resolution of the patient's ascites for better evaluation. 3. Tiny pleural effusions and mild bibasilar atelectasis. 4. Atherosclerotic changes in the nonaneurysmal aorta. 5. Right abdominal wall lipoma. 6. Tiny periumbilical fat containing hernia. Electronically Signed   By: Dorise Bullion III M.D   On: 03/03/2018 18:17   Dg Chest 2 View  Result Date: 03/03/2018 CLINICAL DATA:  Unwitnessed fall.  Hypertension. EXAM: CHEST - 2 VIEW COMPARISON:  August 09, 2017 FINDINGS: There is atelectatic change in the right mid lower lung zones. There is no edema or consolidation. Heart is borderline enlarged with pulmonary vascularity normal. Aorta is prominent and rather tortuous, stable. There are metallic fragments in the left hemithorax. No pneumothorax. No bone lesions. IMPRESSION: No edema or consolidation. Areas  of mild atelectatic change on the right. Stable cardiac silhouette. Aortic prominence and tortuosity likely reflect chronic hypertensive change. Electronically Signed   By: Lowella Grip III M.D.   On: 03/03/2018 11:44   Ct Head Wo Contrast  Result Date: 03/03/2018 CLINICAL DATA:  Unwitnessed fall EXAM: CT HEAD WITHOUT CONTRAST TECHNIQUE: Contiguous axial images were obtained from the base of the skull through the vertex without intravenous contrast. COMPARISON:  Head CT August 09, 2017 and brain MRI February 08, 2018 FINDINGS: Brain: There is moderate diffuse atrophy. There is no intracranial mass, hemorrhage, extra-axial fluid collection, or midline shift. There is evidence of a prior infarct involving much of the left occipital lobe, stable. There is evidence of a prior infarct in the mid right occipital lobe, stable. There is evidence of a prior infarct at the gray-white junction of the anterior right parietal lobe, stable. Elsewhere there is patchy small vessel disease in the centra semiovale bilaterally. There is no acute appearing infarct. Basal ganglia calcification bilaterally is a physiologic finding. A small calcification in the mid right cerebellum may represent a small granuloma. This finding was present on prior study. Vascular: There is no hyperdense vessel. There is calcification in the left vertebral artery as well as in both carotid siphon regions. Skull: Bony calvarium appears intact. There is a right parietal scalp hematoma. Sinuses/Orbits: There is opacification in a posterior ethmoid air cell. There is mucosal thickening in several ethmoid air cells bilaterally. There is inward bowing of each medial orbital wall, a finding that may be congenital or may be due to previous trauma. This finding is stable. No intraorbital lesions are evident. Other: Mastoid air cells are clear. IMPRESSION: 1. Right parietal scalp hematoma with underlying bony calvarium intact. 2. Atrophy with supratentorial  small vessel disease. Prior infarcts as noted, largest in the occipital lobes. 3.  No mass or hemorrhage.  No extra-axial fluid collection. 4.  Foci of arterial vascular calcification. 5.  Ethmoid sinus disease noted. Electronically Signed   By: Lowella Grip III M.D.   On: 03/03/2018 11:21   Mr Brain Wo Contrast  Result Date: 02/08/2018  [facility] [address], [place], [place] [address] [phone] NEUROIMAGING REPORT STUDY DATE: 02/08/2018 PATIENT NAME: AN SCHNABEL DOB: 04-May-1930 MRN: [id number] EXAM: MRI Brain without contrast ORDERING CLINICIAN: Venancio Poisson, NP CLINICAL HISTORY: 84 year old man with history of stroke and neurologic symptoms COMPARISON FILMS: MRI 08/10/2017 TECHNIQUE: MRI of the brain without contrast was obtained utilizing 5 mm axial slices with T1, T2, T2 flair, SWI and diffusion weighted views.  T1 sagittal and T2 coronal views were obtained. CONTRAST: none IMAGING SITE: [facility], [facility], [place] FINDINGS: On sagittal images, the spinal cord is imaged caudally to C4 and is normal in caliber.  There are degenerative changes at C3-C4.  The contents of the posterior fossa are of normal size and position.   The pituitary gland and optic chiasm appear normal.    There is mild to moderate generalized cortical atrophy mild corpus callosal atrophy..  There are no abnormal extra-axial collections of fluid.  The cerebellum and brainstem appears normal.  T1 hyperintense focus posteriorly in the pons on the T1 sagittal images not seen on any of the other sequences and likely representing artifact.  The deep gray matter appears normal.  Remote stroke in the MCA distribution on the left predominantly in the posterior temporal lobe and adjacent parietal lobe. Susceptibility weighted images show some microhemorrhages within the left temporoparietal stroke.   Of note, the stroke was acute on the 08/10/2017 MRI and it has shown expected evolution  over time.  There is also a remote right parietal stroke unchanged compared to the previous MRI.  There are scattered T2/FLAIR hypertense foci consistent with chronic microvascular ischemic change.  The current diffusion-weighted images are normal. The VIIth/VIIIth nerve complex appears normal.  There are bilateral lens replacements.  The orbits are otherwise normal.  The mastoid air cells appear normal.  The paranasal sinuses appear normal.  Flow voids are identified within the major intracerebral arteries.     This MRI of the brain without contrast shows the following: 1.    Expected evolution of the temporal parietal stroke that was acute on the 08/10/2017 MRI.   There is a mild amount of chronic microhemorrhage noted 2.    Remote right parietal stroke and chronic microvascular ischemic change. 3.    There are no acute findings. INTERPRETING PHYSICIAN: Richard A. Felecia Shelling, MD, PhD, FAAN Certified in  Neuroimaging by [employer]   US Renal  Result Date: 03/05/2018 CLINICAL DATA:  Acute tubular necrosis. EXAM: RENAL / URINARY TRACT ULTRASOUND COMPLETE COMPARISON:  CT 2 days ago. FINDINGS: Right Kidney: Length: 11.3 cm. Echogenicity within normal limits. No mass or hydronephrosis visualized. Left Kidney: Length: 12.0 cm. Echogenicity within normal limits. No mass or hydronephrosis visualized. Bladder: Urine is present within the bladder.  No ureteral jet is visualized. There is ascites diffusely distributed. IMPRESSION: Kidneys appear normal by sonography. Normal size and echogenicity. No obstruction. Urine in the bladder. No ureteral jet is identified however. Diffuse ascites. Electronically Signed   By: Nelson Chimes M.D.   On: 03/05/2018 09:27   Ir Paracentesis  Result Date: 03/05/2018 INDICATION: Patient with history of congestive heart failure, stroke, malnutrition, acute kidney injury, suggested retroperitoneal adenopathy on CT, ascites. Request made for diagnostic and therapeutic  paracentesis. EXAM: ULTRASOUND GUIDED DIAGNOSTIC AND THERAPEUTIC PARACENTESIS MEDICATIONS: None COMPLICATIONS: None immediate. PROCEDURE: Informed written consent was obtained from the patient after a discussion of the risks, benefits and alternatives to treatment. A timeout  was performed prior to the initiation of the procedure. Initial ultrasound scanning demonstrates a moderate amount of ascites within the left mid to lower abdominal quadrant. The left mid to lower abdomen was prepped and draped in the usual sterile fashion. 2% lidocaine was used for local anesthesia. Following this, a 19 gauge, 7-cm, Yueh catheter was introduced. An ultrasound image was saved for documentation purposes. The paracentesis was performed. The catheter was removed and a dressing was applied. The patient tolerated the procedure well without immediate post procedural complication. FINDINGS: A total of approximately 2.8 liters of yellow fluid was removed. Samples were sent to the laboratory as requested by the clinical team. IMPRESSION: Successful ultrasound-guided diagnostic and therapeutic paracentesis yielding 2.8 liters of peritoneal fluid. Read by: Rowe Williams, PA-C Electronically Signed   By: Sandi Mariscal M.D.   On: 03/05/2018 14:30    Time Spent in minutes  25   Bonnell Public M.D on 03/08/2018 at 2:20 PM  Between 7am to [place] [phone] After 7pm go to [email or web address] - password [facility]  [facility] -  Office  [phone]

## 2018-03-09 DIAGNOSIS — Z8673 Personal history of transient ischemic attack (TIA), and cerebral infarction without residual deficits: Secondary | ICD-10-CM

## 2018-03-09 DIAGNOSIS — I10 Essential (primary) hypertension: Secondary | ICD-10-CM

## 2018-03-09 DIAGNOSIS — Z6825 Body mass index (BMI) 25.0-25.9, adult: Secondary | ICD-10-CM

## 2018-03-09 DIAGNOSIS — I5021 Acute systolic (congestive) heart failure: Secondary | ICD-10-CM

## 2018-03-09 DIAGNOSIS — I739 Peripheral vascular disease, unspecified: Secondary | ICD-10-CM

## 2018-03-09 DIAGNOSIS — F0390 Unspecified dementia without behavioral disturbance: Secondary | ICD-10-CM

## 2018-03-09 DIAGNOSIS — C169 Malignant neoplasm of stomach, unspecified: Secondary | ICD-10-CM

## 2018-03-09 DIAGNOSIS — I480 Paroxysmal atrial fibrillation: Secondary | ICD-10-CM

## 2018-03-09 DIAGNOSIS — C786 Secondary malignant neoplasm of retroperitoneum and peritoneum: Secondary | ICD-10-CM

## 2018-03-09 DIAGNOSIS — K56699 Other intestinal obstruction unspecified as to partial versus complete obstruction: Secondary | ICD-10-CM

## 2018-03-09 DIAGNOSIS — D649 Anemia, unspecified: Secondary | ICD-10-CM

## 2018-03-09 DIAGNOSIS — R180 Malignant ascites: Secondary | ICD-10-CM

## 2018-03-09 DIAGNOSIS — E440 Moderate protein-calorie malnutrition: Secondary | ICD-10-CM

## 2018-03-09 DIAGNOSIS — R55 Syncope and collapse: Secondary | ICD-10-CM

## 2018-03-09 LAB — GLUCOSE, CAPILLARY: Glucose-Capillary: 91 mg/dL (ref 70–99)

## 2018-03-09 NOTE — Progress Notes (Signed)
Progress Note  Patient Name: [name] Date of Encounter: 03/08/2018  Primary Cardiologist: Pixie Casino, MD   Subjective   [name] denies any chest discomfort, shortness of breath or palpitations.  He is lying almost flat comfortably. He is pleasantly conversing, but sad that he cannot eat as he is NPO. He underwent EGD yesterday, identifying a gastric mass causing gastric outlet obstruction.  Inpatient Medications    Scheduled Meds: . atorvastatin  20 mg Oral QHS  . cholecalciferol  1,000 Units Oral Daily  . feeding supplement (ENSURE ENLIVE)  237 mL Oral TID BM  . metoprolol tartrate  12.5 mg Oral BID  . mirtazapine  7.5 mg Oral QHS  . multivitamin with minerals  1 tablet Oral Daily  . pantoprazole  40 mg Oral BID  . polyethylene glycol  17 g Oral Daily  . senna-docusate  2 tablet Oral BID  . sertraline  50 mg Oral Daily  . sodium chloride flush  3 mL Intravenous Q12H   Continuous Infusions: . dextrose 5 % and 0.9% NaCl 50 mL/hr (03/08/18 1845)   PRN Meds: acetaminophen, bisacodyl, lidocaine, metoprolol tartrate, ondansetron **OR** ondansetron (ZOFRAN) IV, senna-docusate   Vital Signs    Vitals:   03/08/18 0558 03/08/18 1023 03/08/18 1208 03/08/18 1933  BP: (!) 126/55  119/66 125/65  Pulse: 80 84 80 81  Resp: 18  18 18   Temp: 98.6 F (37 C)  99.5 F (37.5 C) (!) 97.4 F (36.3 C)  TempSrc: Oral  Oral Oral  SpO2: 100% 96% 98% 97%  Weight:      Height:        Intake/Output Summary (Last 24 hours) at 03/09/2018 0020 Last data filed at 03/08/2018 1729 Gross per 24 hour  Intake 415.77 ml  Output 350 ml  Net 65.77 ml   Filed Weights   03/06/18 0536 03/07/18 0459 03/08/18 0500  Weight: 73.9 kg 70.5 kg 71.2 kg    ECG    No new tracings- Personally Reviewed  Physical Exam   Constitutional: No acute distress ENMT:poordentition, moist mucous membranes Cardiovascular:irregularrhythm, normal rate, no murmurs. S1 and S2 normal. No jugular venous  distention.  Respiratory: clear to auscultation bilaterally GI : normal bowel sounds, soft and nontender.Distended but not tense.  MSK: extremities warm, well perfused.Traceedema.  NEURO: grossly nonfocal exam, moves all extremities. PSYCH: alert and oriented x 3, normal mood and affect.   Labs    Chemistry Recent Labs  Lab 03/03/18 0919  03/06/18 0448 03/07/18 0534 03/08/18 0523  NA 138   < > 142 139 142  K 4.9   < > 4.3 4.2 4.1  CL 103   < > 111 108 112*  CO2 25   < > 23 25 24   GLUCOSE 97   < > 80 89 84  BUN 30*   < > 23 22 18   CREATININE 2.34*   < > 1.72* 1.52* 1.27*  CALCIUM 8.3*   < > 8.1* 8.2* 8.2*  PROT 4.9*  --   --   --   --   ALBUMIN 2.3*  --   --   --  2.0*  AST 19  --   --   --   --   ALT 9  --   --   --   --   ALKPHOS 98  --   --   --   --   BILITOT 1.0  --   --   --   --  GFRNONAA 24*   < > 34* 40* 49*  GFRAA 27*   < > 40* 46* 57*  ANIONGAP 10   < > 8 6 6    < > = values in this interval not displayed.     Hematology Recent Labs  Lab 03/05/18 0419 03/06/18 0448 03/07/18 0534 03/08/18 0520  WBC 3.8* 4.5 6.0  --   RBC 2.99* 3.32* 3.60*  --   HGB 8.2* 9.2* 9.9* 9.0*  HCT 27.8* 31.1* 33.4* 30.1*  MCV 93.0 93.7 92.8  --   MCH 27.4 27.7 27.5  --   MCHC 29.5* 29.6* 29.6*  --   RDW 15.2 15.1 15.3  --   PLT 362 396 454*  --     Cardiac Enzymes Recent Labs  Lab 03/03/18 1416 03/03/18 2022  TROPONINI <0.03 <0.03    Recent Labs  Lab 03/03/18 1009  TROPIPOC 0.00     BNPNo results for input(s): BNP, PROBNP in the last 168 hours.   DDimer No results for input(s): DDIMER in the last 168 hours.   Radiology    No results found.  Cardiac Studies   Echo 03/04/18 Study Conclusions - Left ventricle: Difficult study Frequent ectopy. LVEF is approximately 35% with severe hypokinesis of the inferior, distal anterior and apical walls; hypokinesis of the inferoseptal wall. The cavity size was normal. Wall thickness was increased in  a pattern of mild LVH. Doppler parameters are consistent with abnormal left ventricular relaxation (grade 1 diastolic dysfunction). - Aortic valve: There was trivial regurgitation. Valve area (Vmax): 2.59 cm^2. - Left atrium: The atrium was moderately dilated. - Pulmonary arteries: PA peak pressure: 34 mm Hg (S). - Pericardium, extracardiac: There was a right pleural effusion.  Patient Profile     84 y.o. male with a hx of HTN, PAD, CVA, dementia admitted after found down at his facility with hypotensionwho is being seen today for the evaluation of CHF  Assessment & Plan    1. Syncope: -Likely in the setting of hypotension versus AF vs anemia, pt with no recollection of events  -Last wore monitor earlier this year which was found to be unrevealing -Troponin negative x3 with no ACS symptoms  -No recurrence since admission  -Consider event monitor at DC -BP stabilized  2. 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  -Would need input from GI as to safety and timing of anticoagulation related to their findings. -Continue low dose BB for rate control   3. Acute systolic CHF: -Echocardiogram with LVEF of 35% with diffuse hypokinesis of the inferoseptal wall and G1DD>>last echo in 08/2017 with LV of 60-65% -New drop in LV function>>>ischemia versus AF induced>>no ACS symptoms  -Last nuclear study in 2014 and 2018 with no inducible ischemia -Lopressor 12.5 mg initiated 03/06/18, No ACE-I started yet due to renal funciton. No ASA given concern for GI bleed  -Newly decreased LV function could be related to acute illness with anemia and afib.  -With his  renal function much improved, could now consider diagnostic cath to evaluate coronary arteries.  Will discuss with attending for timing of ischemic evaluation and consideration of possible need for abdominal surgery.  4. Ascites: -Unknown etiology  -s/p paracentesis on 03/05/18 with  2.8L removed  -Per primary team. ? If related to gastric tumor.  5. AKI:  -Creatinine, continues to improve 2.34>2.14>1.72>1.52 >1.27 -Possibly due to hypoperfusion related to anemia, now improved with increase in hemoglobin after transfusion -Baseline appears to be in the 0.8-0.9 range  -Avoid nephrotoxic medications and monitor with daily BMET -Per primary team    6. GI bleed/Anemia: - Hemoglobin 9 today -Stools heme +  -GI consulted.  Pt underwent EGD today with finding of likely malignant gastric tumor in the region of the stomach and complete gastric outlet obstruction.      For questions or updates, please contact [place] Please consult [email or web address] for contact info under        Signed, Elouise Munroe, MD  03/08/2018

## 2018-03-09 NOTE — Progress Notes (Signed)
Asked by hospital team to review case and see if stenting is a possibility and I put a call into my partner Dr. Oletta Lamas who did his endoscopy who was not sure if there was an obvious lumen but it looks to me like they did give him oral contrast with his CT scan and it seemed to pass through the area and if the patient is willing to let me try I will look into a convenient time for early next week and will check on him tomorrow and the endoscopy pictures and CT and lab work were reviewed

## 2018-03-09 NOTE — Progress Notes (Signed)
Progress Note  Patient Name: [name] Date of Encounter: 03/08/2018  Primary Cardiologist: Pixie Casino, MD   Subjective   No acute events overnight. Denies chest pain, shortness of breath. Denies abdominal pain.   Inpatient Medications    Scheduled Meds: . atorvastatin  20 mg Oral QHS  . cholecalciferol  1,000 Units Oral Daily  . feeding supplement (ENSURE ENLIVE)  237 mL Oral TID BM  . metoprolol tartrate  12.5 mg Oral BID  . mirtazapine  7.5 mg Oral QHS  . multivitamin with minerals  1 tablet Oral Daily  . pantoprazole  40 mg Oral BID  . polyethylene glycol  17 g Oral Daily  . senna-docusate  2 tablet Oral BID  . sertraline  50 mg Oral Daily  . sodium chloride flush  3 mL Intravenous Q12H   Continuous Infusions: . dextrose 5 % and 0.9% NaCl 50 mL/hr (03/08/18 1845)   PRN Meds: acetaminophen, bisacodyl, lidocaine, metoprolol tartrate, ondansetron **OR** ondansetron (ZOFRAN) IV, senna-docusate   Vital Signs    Vitals:   03/08/18 1023 03/08/18 1208 03/08/18 1933 03/09/18 0422  BP:  119/66 125/65 119/74  Pulse: 84 80 81 80  Resp:  18 18 18   Temp:  99.5 F (37.5 C) (!) 97.4 F (36.3 C) 98.1 F (36.7 C)  TempSrc:  Oral Oral Oral  SpO2: 96% 98% 97% 100%  Weight:    72.6 kg  Height:        Intake/Output Summary (Last 24 hours) at 03/09/2018 1015 Last data filed at 03/09/2018 0958 Gross per 24 hour  Intake 240 ml  Output 700 ml  Net -460 ml   Filed Weights   03/07/18 0459 03/08/18 0500 03/09/18 0422  Weight: 70.5 kg 71.2 kg 72.6 kg    ECG    No new tracings- Personally Reviewed Telemetry: sinus arrhythmia and then atrial fibrillation--personally reviewed  Physical Exam   Constitutional: No acute distress ENMT:poordentition, moist mucous membranes Cardiovascular:irregularly irregularrhythm, normal rate, no murmurs. S1 and S2 normal. No jugular venous distention.  Respiratory: clear to auscultation bilaterally except for faint crackles at  left base GI : normal bowel sounds, soft and nontender.Distended but not tense.  MSK: extremities warm, well perfused.Traceedema.  NEURO: grossly nonfocal exam, moves all extremities. PSYCH: alert and oriented x 3, normal mood and affect.   Labs    Chemistry Recent Labs  Lab 03/03/18 0919  03/06/18 0448 03/07/18 0534 03/08/18 0523  NA 138   < > 142 139 142  K 4.9   < > 4.3 4.2 4.1  CL 103   < > 111 108 112*  CO2 25   < > 23 25 24   GLUCOSE 97   < > 80 89 84  BUN 30*   < > 23 22 18   CREATININE 2.34*   < > 1.72* 1.52* 1.27*  CALCIUM 8.3*   < > 8.1* 8.2* 8.2*  PROT 4.9*  --   --   --   --   ALBUMIN 2.3*  --   --   --  2.0*  AST 19  --   --   --   --   ALT 9  --   --   --   --   ALKPHOS 98  --   --   --   --   BILITOT 1.0  --   --   --   --   GFRNONAA 24*   < > 34* 40* 49*  GFRAA  27*   < > 40* 46* 57*  ANIONGAP 10   < > 8 6 6    < > = values in this interval not displayed.     Hematology Recent Labs  Lab 03/05/18 0419 03/06/18 0448 03/07/18 0534 03/08/18 0520  WBC 3.8* 4.5 6.0  --   RBC 2.99* 3.32* 3.60*  --   HGB 8.2* 9.2* 9.9* 9.0*  HCT 27.8* 31.1* 33.4* 30.1*  MCV 93.0 93.7 92.8  --   MCH 27.4 27.7 27.5  --   MCHC 29.5* 29.6* 29.6*  --   RDW 15.2 15.1 15.3  --   PLT 362 396 454*  --     Cardiac Enzymes Recent Labs  Lab 03/03/18 1416 03/03/18 2022  TROPONINI <0.03 <0.03    Recent Labs  Lab 03/03/18 1009  TROPIPOC 0.00     BNPNo results for input(s): BNP, PROBNP in the last 168 hours.   DDimer No results for input(s): DDIMER in the last 168 hours.   Radiology    No results found.  Cardiac Studies   Echo 03/04/18 Study Conclusions - Left ventricle: Difficult study Frequent ectopy. LVEF is approximately 35% with severe hypokinesis of the inferior, distal anterior and apical walls; hypokinesis of the inferoseptal wall. The cavity size was normal. Wall thickness was increased in a pattern of mild LVH. Doppler parameters are  consistent with abnormal left ventricular relaxation (grade 1 diastolic dysfunction). - Aortic valve: There was trivial regurgitation. Valve area (Vmax): 2.59 cm^2. - Left atrium: The atrium was moderately dilated. - Pulmonary arteries: PA peak pressure: 34 mm Hg (S). - Pericardium, extracardiac: There was a right pleural effusion.  Patient Profile     84 y.o. male with a hx of HTN, PAD, CVA, dementia admitted after found down at his facility with hypotensionwho is being followed for syncope, paroxysmal atrial fibrillation and acute systolic heart failure.   Assessment & Plan    1. Syncope: no further events -Likely in the setting of hypotension versus AF vs anemia, pt with no recollection of events  -Last wore monitor earlier this year which was found to be unrevealing -Troponin negative x3 with no ACS symptoms   2. PAF:  -intermittent sinus rhythm/sinus arrhythmia and atrial fibrillation. Hstory of CVA in 08/2017 -Metoprolol 12.5mg  twice daily started 03/06/18 with stable BP. He is currently rate controlled. -CHA2DS2VASc =7 places him at high risk for stroke. There is discussion regarding his new metastatic poorly differentiated gastric adenocarcinoma (malignant cells in ascites). Palliative care is being consulted. Based on family/patient preferences, if no surgery is planned, could discuss anticoagulation. His hemoglobin has been low, so would need to discuss risk/benefit carefully. If no surgery planned, would be helpful to know the risk of this tumor causing significant GI bleed if he is anticoagulated.  3. Acute systolic CHF: -Echocardiogram with LVEF of 35% with diffuse hypokinesis of the inferoseptal wall and G1DD>>last echo in 08/2017 with LV of 60-65% -unclear if this is ischemic (though no symptoms) versus nonischemic. In the setting of palliative care discussions with metastatic poorly differentiated gastric adenocarcinoma, he would not likely benefit from cath. Will  pursue medical management. On beta blocker. Renal function improving, if he remains stable could consider low dose ACEI.  4. Management per primary team Ascites: malignant on cytology -s/p paracentesis on 03/05/18 with 2.8L removed  AKI: improving -Avoid nephrotoxic medications and monitor with daily BMET GI bleed/Anemia: - heme positive stools, EGD with gastric adenocarcinoma as above.    For questions  or updates, please contact [place] Please consult [email or web address] for contact info under    Signed, Buford Dresser, MD  03/08/2018

## 2018-03-09 NOTE — Consult Note (Signed)
[place]  [phone]   HEMATOLOGY ONCOLOGY INPATIENT CONSULTATION   [name]  DOB: [birth year]  MR#: [id number]  CSN#: [id number]    Requesting Physician: [facility] Dr. Marthenia Rolling   Patient Care Team: Patient, [name] as PCP - General ([place]) Pixie Casino, MD as PCP - Cardiology (Cardiology) Lowella Bandy, MD as Attending Physician (Urology)  Reason for consult: Newly diagnosed metastatic gastric cancer  History of present illness:   This is a 84 year old African-American male, with past medical history of hypertension, CVA, mild dementia, peripheral vascular disease, BPH, who is a resident at [facility], was brought by EMS after found on the floor of his facility.  He was found to have anemia, and hypotensive, further work-up were reviewed a large gastric mass in the prepyloric area, with complete gastric outlet obstruction.  Biopsy confirmed adenocarcinoma.  CT scan also revealed diffuse ascites, paracentesis was done, cytology was positive for adenocarcinoma.  I was consulted for management.  Patient lives alone, has no children, has only 1 living sister who is in [place].  She has a power of attorney Linken Mcglothen, and his wife mother Oletta Lamas, who have been his friends for more than 40 years.  MEDICAL HISTORY:  Past Medical History:  Diagnosis Date  . BPH (benign prostatic hyperplasia)   . Cataract   . Essential hypertension 05/08/2013  . Exertional dyspnea    with exertion  . Hypertension   . Mitral valvular regurgitation June 2014   Mild to moderate MR on echocardiogram  . PAD (peripheral artery disease) ([place])  December 2014   Lower extremity Dopplers/ABIs: RABI 0.29, LABI 0.66; bilateral external iliacs with significant diameter reduction; R. SFA occlusive disease throughout into popliteal artery with 0 vessel runoff. Anterior tibial reconstitutes distally. 70-99% reduction in all SFA. One-vessel runoff (anterior  tibial)  . Stroke ([place])   . Syncope and collapse 03/03/2018    SURGICAL HISTORY: Past Surgical History:  Procedure Laterality Date  . ABDOMINAL ANGIOGRAM  07/07/2013   Procedure: ABDOMINAL ANGIOGRAM;  Surgeon: Lorretta Harp, MD;  Location: [facility] CATH LAB;  Service: Cardiovascular;;  . BIOPSY  03/07/2018   Procedure: BIOPSY;  Surgeon: Laurence Spates, MD;  Location: [facility] ENDOSCOPY;  Service: Endoscopy;;  . CHOLECYSTECTOMY  07/04/2010  . ERCP N/A 11/27/2012   Procedure: ENDOSCOPIC RETROGRADE CHOLANGIOPANCREATOGRAPHY (ERCP);  Surgeon: Inda Castle, MD;  Location: [facility] ENDOSCOPY;  Service: Endoscopy;  Laterality: N/A;  . ESOPHAGOGASTRODUODENOSCOPY (EGD) WITH PROPOFOL N/A 03/07/2018   Procedure: ESOPHAGOGASTRODUODENOSCOPY (EGD) WITH PROPOFOL;  Surgeon: Laurence Spates, MD;  Location: [facility];  Service: Endoscopy;  Laterality: N/A;  . EYE SURGERY Right   . IR PARACENTESIS  03/05/2018  . LEFT HEART CATHETERIZATION WITH CORONARY ANGIOGRAM N/A 07/07/2013   Procedure: LEFT HEART CATHETERIZATION WITH CORONARY ANGIOGRAM;  Surgeon: Lorretta Harp, MD;  Location: [facility] CATH LAB;  Service: Cardiovascular;  Laterality: N/A;  . LOWER EXTREMITY ANGIOGRAM N/A 07/07/2013   Procedure: LOWER EXTREMITY ANGIOGRAM;  Surgeon: Lorretta Harp, MD;  Location: [facility] CATH LAB;  Service: Cardiovascular;  Laterality: N/A;  . NM MYOVIEW LTD  December 2014   Apical thinning but no ischemia. EF 55%  . PROSTATE SURGERY     Dr. Lowella Bandy  . RECTAL EXAM UNDER ANESTHESIA  03/07/2018   Procedure: RECTAL EXAM UNDER ANESTHESIA;  Surgeon: Laurence Spates, MD;  Location: [facility] ENDOSCOPY;  Service: Endoscopy;;  . TRANSTHORACIC ECHOCARDIOGRAM  June 2014   Basal septal thickening with moderate LVH. EF  65%. Normal wall motion. Diastolic dysfunction/NOS. Aortic sclerosis without stenosis. Mild to moderate MR    SOCIAL HISTORY: Social History   Socioeconomic History  . Marital status: Single    Spouse name: Not on file  . Number of children:  Not on file  . Years of education: Not on file  . Highest education level: Not on file  Occupational History  . Occupation: Retired    Fish farm manager: RETIRED  Social Needs  . Financial resource strain: Not hard at all  . Food insecurity:    Worry: Never true    Inability: Never true  . Transportation needs:    Medical: No    Non-medical: No  Tobacco Use  . Smoking status: Former Smoker    Types: Cigarettes    Last attempt to quit: 11/25/1980    Years since quitting: 37.3  . Smokeless tobacco: Never Used  Substance and Sexual Activity  . Alcohol use: Not Currently    Alcohol/week: 4.0 standard drinks    Types: 4 Standard drinks or equivalent per week    Comment: beers  . Drug use: No  . Sexual activity: Not Currently  Lifestyle  . Physical activity:    Days per week: 0 days    Minutes per session: 0 min  . Stress: Not at all  Relationships  . Social connections:    Talks on phone: Once a week    Gets together: Once a week    Attends religious service: Never    Active member of club or organization: No    Attends meetings of clubs or organizations: Never    Relationship status: Never married  . Intimate partner violence:    Fear of current or ex partner: No    Emotionally abused: No    Physically abused: No    Forced sexual activity: No  Other Topics Concern  . Not on file  Social History Narrative   He is originally from [place]. He has a history of long-standing tobacco use but quit several years ago. He also has a history of former alcohol abuse as well.   He is retired and currently single.    FAMILY HISTORY: Family History  Problem Relation Age of Onset  . Diabetes Sister   . Cancer Sister   . Cancer Mother   . Cancer Father   . Cancer Sister   . Heart disease Brother     ALLERGIES:  has No Known Allergies.  MEDICATIONS:  Current Facility-Administered Medications  Medication Dose Route Frequency Provider Last Rate Last Dose  . acetaminophen (TYLENOL)  tablet 650 mg  650 mg Oral Q6H PRN Laurence Spates, MD      . atorvastatin (LIPITOR) tablet 20 mg  20 mg Oral Madolyn Frieze, MD   20 mg at 03/07/18 2042  . bisacodyl (DULCOLAX) EC tablet 10 mg  10 mg Oral Daily PRN Laurence Spates, MD   10 mg at 03/06/18 1037  . cholecalciferol (VITAMIN D) tablet 1,000 Units  1,000 Units Oral Daily Laurence Spates, MD   1,000 Units at 03/09/18 [phone]  . dextrose 5 %-0.9 % sodium chloride infusion   Intravenous Continuous Dhungel, Nishant, MD 50 mL/hr at 03/08/18 1845 50 mL/hr at 03/08/18 1845  . feeding supplement (ENSURE ENLIVE) (ENSURE ENLIVE) liquid 237 mL  237 mL Oral TID BM Laurence Spates, MD   237 mL at 03/08/18 0839  . lidocaine (XYLOCAINE) 2 % injection    PRN Allred, Darrell K, PA-C   10 mL at  03/05/18 1401  . metoprolol tartrate (LOPRESSOR) injection 5 mg  5 mg Intravenous Q5 min PRN Blount, Scarlette Shorts T, NP      . metoprolol tartrate (LOPRESSOR) tablet 12.5 mg  12.5 mg Oral BID Laurence Spates, MD   12.5 mg at 03/09/18 0925  . mirtazapine (REMERON) tablet 7.5 mg  7.5 mg Oral Madolyn Frieze, MD   7.5 mg at 03/07/18 2042  . multivitamin with minerals tablet 1 tablet  1 tablet Oral Daily Laurence Spates, MD   1 tablet at 03/09/18 [phone]  . ondansetron (ZOFRAN) tablet 4 mg  4 mg Oral Q6H PRN Laurence Spates, MD       Or  . ondansetron [facility]) injection 4 mg  4 mg Intravenous Q6H PRN Laurence Spates, MD   4 mg at 03/04/18 1822  . pantoprazole (PROTONIX) EC tablet 40 mg  40 mg Oral BID Laurence Spates, MD   40 mg at 03/09/18 0925  . polyethylene glycol (MIRALAX / GLYCOLAX) packet 17 g  17 g Oral Daily Laurence Spates, MD   17 g at 03/03/18 1508  . senna-docusate (Senokot-S) tablet 1 tablet  1 tablet Oral QHS PRN Laurence Spates, MD      . senna-docusate (Senokot-S) tablet 2 tablet  2 tablet Oral BID Laurence Spates, MD   2 tablet at 03/09/18 [phone]  . sertraline (ZOLOFT) tablet 50 mg  50 mg Oral Daily Laurence Spates, MD   50 mg at 03/09/18 0925  . sodium chloride flush  (NS) 0.9 % injection 3 mL  3 mL Intravenous Lance Coon, MD   3 mL at 03/07/18 1326    REVIEW OF SYSTEMS:   Constitutional: Denies fevers, chills or abnormal night sweats, (+) fatigue and weight loss  Eyes: Denies blurriness of vision, double vision or watery eyes Ears, nose, mouth, throat, and face: Denies mucositis or sore throat Respiratory: Denies cough, dyspnea or wheezes Cardiovascular: Denies palpitation, chest discomfort or lower extremity swelling Gastrointestinal:  Denies nausea, heartburn or change in bowel habits, (+) abdominal bloating  Skin: Denies abnormal skin rashes Lymphatics: Denies new lymphadenopathy or easy bruising Neurological:Denies numbness, tingling or new weaknesses Behavioral/Psych: Mood is stable, no new changes  All other systems were reviewed with the patient and are negative.  PHYSICAL EXAMINATION: ECOG PERFORMANCE STATUS: 3 - Symptomatic, >50% confined to bed  Vitals:   03/09/18 0422 03/09/18 1223  BP: 119/74 120/78  Pulse: 80 100  Resp: 18 20  Temp: 98.1 F (36.7 C) 98.5 F (36.9 C)  SpO2: 100% 98%   Filed Weights   03/07/18 0459 03/08/18 0500 03/09/18 0422  Weight: 155 lb 8 oz (70.5 kg) 157 lb (71.2 kg) 160 lb (72.6 kg)    GENERAL:alert, no distress and comfortable, sitting in chair  SKIN: skin color, texture, turgor are normal, no rashes or significant lesions EYES: normal, conjunctiva are pink and non-injected, sclera clear OROPHARYNX:no exudate, no erythema and lips, buccal mucosa, and tongue normal  NECK: supple, thyroid normal size, non-tender, without nodularity LYMPH:  no palpable lymphadenopathy in the cervical, axillary or inguinal LUNGS: clear to auscultation and percussion with normal breathing effort HEART: regular rate & rhythm and no murmurs and no lower extremity edema ABDOMEN:abdomen soft, distended, non-tender and normal bowel sounds Musculoskeletal:no cyanosis of digits and no clubbing  PSYCH: alert & oriented x  3 with fluent speech NEURO: no focal motor/sensory deficits  LABORATORY DATA:  I have reviewed the data as listed Lab Results  Component Value Date   WBC  6.0 03/07/2018   HGB 9.0 (L) 03/08/2018   HCT 30.1 (L) 03/08/2018   MCV 92.8 03/07/2018   PLT 454 (H) 03/07/2018   Recent Labs    08/09/17 2032  08/10/17 0548  03/03/18 0919  03/06/18 0448 03/07/18 0534 03/08/18 0523  NA 141   < > 143   < > 138   < > 142 139 142  K 3.9   < > 3.4*   < > 4.9   < > 4.3 4.2 4.1  CL 103   < > 107  --  103   < > 111 108 112*  CO2 27  --  26  --  25   < > _0 GLUCOSE 118*   < > 99  --  97   < > 80 89 84  BUN 9   < > 8   < > 30*   < > _1 CREATININE 0.92   < > 0.88   < > 2.34*   < > 1.72* 1.52* 1.27*  CALCIUM 9.6  --  8.5*  --  8.3*   < > 8.1* 8.2* 8.2*  GFRNONAA >60  --  >60  --  24*   < > 34* 40* 49*  GFRAA >60  --  >60  --  27*   < > 40* 46* 57*  PROT 5.9*  --  5.3*  --  4.9*  --   --   --   --   ALBUMIN 3.7  --  3.1*  --  2.3*  --   --   --  2.0*  AST 21  --  12*  --  19  --   --   --   --   ALT 9*  --  8*  --  9  --   --   --   --   ALKPHOS 95  --  82  --  98  --   --   --   --   BILITOT 1.4*  --  0.6  --  1.0  --   --   --   --    < > = values in this interval not displayed.    RADIOGRAPHIC STUDIES: I have personally reviewed the radiological images as listed and agreed with the findings in the report. Ct Abdomen Wo Contrast  Result Date: 03/03/2018 CLINICAL DATA:  Abdominal mass. Appears to be an abdominal wall hernia versus rectus sheath hematoma or mass. EXAM: CT ABDOMEN WITHOUT CONTRAST TECHNIQUE: Multidetector CT imaging of the abdomen was performed following the standard protocol without IV contrast. COMPARISON:  February 25, 2016 FINDINGS: Lower chest: Mild atelectasis in the bases.  Small effusions. Hepatobiliary: Pneumobilia is consistent with previous sphincterotomy, unchanged, in the left hepatic lobe. The patient's known left hepatic mass is not well assessed  without contrast on today's study. This has the appearance of a hemangioma on previous imaging. The liver is otherwise unremarkable. The contour of the liver is smooth and not nodular. The patient is status post cholecystectomy. Pancreas: Unremarkable. No pancreatic ductal dilatation or surrounding inflammatory changes. Spleen: Normal in size without focal abnormality. Adrenals/Urinary Tract: Adrenal glands are unremarkable. Kidneys are normal, without renal calculi, focal lesion, or hydronephrosis. Stomach/Bowel: The stomach and small bowel are normal without identified obstruction. The colon is decompressed but does contain contrast. No obvious colonic abnormality. The cecum and appendix, if the patient has 1, are below today's film. Vascular/Lymphatic: Atherosclerotic  changes in the nonaneurysmal aorta. Probable shotty and mildly enlarged retroperitoneal nodes, poorly evaluated due to lack of contrast and lack of intra-abdominal fat. An apparent left periaortic node on image 35 measures 15 mm in short axis. Other: Diffuse ascites of uncertain etiology. Lipoma in the right abdominal wall as seen on series 3, image 34. No other abdominal wall mass. Tiny periumbilical hernia. No other hernia is noted. No rectus muscle hematoma identified. Musculoskeletal: No acute or significant osseous findings. IMPRESSION: 1. Diffuse ascites of uncertain etiology. 2. Suggested retroperitoneal adenopathy, poorly evaluated due to lack of contrast and lack of intra-abdominal fat. The nodes are nonspecific but could be reactive or neoplastic. Recommend follow-up after resolution of the patient's ascites for better evaluation. 3. Tiny pleural effusions and mild bibasilar atelectasis. 4. Atherosclerotic changes in the nonaneurysmal aorta. 5. Right abdominal wall lipoma. 6. Tiny periumbilical fat containing hernia. Electronically Signed   By: Dorise Bullion III M.D   On: 03/03/2018 18:17   Dg Chest 2 View  Result Date:  03/03/2018 CLINICAL DATA:  Unwitnessed fall.  Hypertension. EXAM: CHEST - 2 VIEW COMPARISON:  August 09, 2017 FINDINGS: There is atelectatic change in the right mid lower lung zones. There is no edema or consolidation. Heart is borderline enlarged with pulmonary vascularity normal. Aorta is prominent and rather tortuous, stable. There are metallic fragments in the left hemithorax. No pneumothorax. No bone lesions. IMPRESSION: No edema or consolidation. Areas of mild atelectatic change on the right. Stable cardiac silhouette. Aortic prominence and tortuosity likely reflect chronic hypertensive change. Electronically Signed   By: Lowella Grip III M.D.   On: 03/03/2018 11:44   Ct Head Wo Contrast  Result Date: 03/03/2018 CLINICAL DATA:  Unwitnessed fall EXAM: CT HEAD WITHOUT CONTRAST TECHNIQUE: Contiguous axial images were obtained from the base of the skull through the vertex without intravenous contrast. COMPARISON:  Head CT August 09, 2017 and brain MRI February 08, 2018 FINDINGS: Brain: There is moderate diffuse atrophy. There is no intracranial mass, hemorrhage, extra-axial fluid collection, or midline shift. There is evidence of a prior infarct involving much of the left occipital lobe, stable. There is evidence of a prior infarct in the mid right occipital lobe, stable. There is evidence of a prior infarct at the gray-white junction of the anterior right parietal lobe, stable. Elsewhere there is patchy small vessel disease in the centra semiovale bilaterally. There is no acute appearing infarct. Basal ganglia calcification bilaterally is a physiologic finding. A small calcification in the mid right cerebellum may represent a small granuloma. This finding was present on prior study. Vascular: There is no hyperdense vessel. There is calcification in the left vertebral artery as well as in both carotid siphon regions. Skull: Bony calvarium appears intact. There is a right parietal scalp hematoma.  Sinuses/Orbits: There is opacification in a posterior ethmoid air cell. There is mucosal thickening in several ethmoid air cells bilaterally. There is inward bowing of each medial orbital wall, a finding that may be congenital or may be due to previous trauma. This finding is stable. No intraorbital lesions are evident. Other: Mastoid air cells are clear. IMPRESSION: 1. Right parietal scalp hematoma with underlying bony calvarium intact. 2. Atrophy with supratentorial small vessel disease. Prior infarcts as noted, largest in the occipital lobes. 3.  No mass or hemorrhage.  No extra-axial fluid collection. 4.  Foci of arterial vascular calcification. 5.  Ethmoid sinus disease noted. Electronically Signed   By: Lowella Grip III M.D.   On:  03/03/2018 11:21   Mr Brain Wo Contrast  Result Date: 02/08/2018  [facility] [address] [place], [place] [address] [phone] NEUROIMAGING REPORT STUDY DATE: 02/08/2018 PATIENT NAME: JARMON JAVID DOB: [birth year] MRN: [id number] EXAM: MRI Brain without contrast ORDERING CLINICIAN: Venancio Poisson, NP CLINICAL HISTORY: 84 year old man with history of stroke and neurologic symptoms COMPARISON FILMS: MRI 08/10/2017 TECHNIQUE: MRI of the brain without contrast was obtained utilizing 5 mm axial slices with T1, T2, T2 flair, SWI and diffusion weighted views.  T1 sagittal and T2 coronal views were obtained. CONTRAST: none IMAGING SITE: [facility], [place], [place] FINDINGS: On sagittal images, the spinal cord is imaged caudally to C4 and is normal in caliber.  There are degenerative changes at C3-C4.  The contents of the posterior fossa are of normal size and position.   The pituitary gland and optic chiasm appear normal.    There is mild to moderate generalized cortical atrophy mild corpus callosal atrophy..  There are no abnormal extra-axial collections of fluid.  The cerebellum and brainstem appears normal.  T1 hyperintense  focus posteriorly in the pons on the T1 sagittal images not seen on any of the other sequences and likely representing artifact.  The deep gray matter appears normal.  Remote stroke in the MCA distribution on the left predominantly in the posterior temporal lobe and adjacent parietal lobe. Susceptibility weighted images show some microhemorrhages within the left temporoparietal stroke.   Of note, the stroke was acute on the 08/10/2017 MRI and it has shown expected evolution over time.  There is also a remote right parietal stroke unchanged compared to the previous MRI.  There are scattered T2/FLAIR hypertense foci consistent with chronic microvascular ischemic change.  The current diffusion-weighted images are normal. The VIIth/VIIIth nerve complex appears normal.  There are bilateral lens replacements.  The orbits are otherwise normal.  The mastoid air cells appear normal.  The paranasal sinuses appear normal.  Flow voids are identified within the major intracerebral arteries.     This MRI of the brain without contrast shows the following: 1.    Expected evolution of the temporal parietal stroke that was acute on the 08/10/2017 MRI.   There is a mild amount of chronic microhemorrhage noted 2.    Remote right parietal stroke and chronic microvascular ischemic change. 3.    There are no acute findings. INTERPRETING PHYSICIAN: Richard A. Felecia Shelling, MD, PhD, FAAN Certified in  Neuroimaging by  [employer]   US Renal  Result Date: 03/05/2018 CLINICAL DATA:  Acute tubular necrosis. EXAM: RENAL / URINARY TRACT ULTRASOUND COMPLETE COMPARISON:  CT 2 days ago. FINDINGS: Right Kidney: Length: 11.3 cm. Echogenicity within normal limits. No mass or hydronephrosis visualized. Left Kidney: Length: 12.0 cm. Echogenicity within normal limits. No mass or hydronephrosis visualized. Bladder: Urine is present within the bladder.  No ureteral jet is visualized. There is ascites diffusely distributed. IMPRESSION:  Kidneys appear normal by sonography. Normal size and echogenicity. No obstruction. Urine in the bladder. No ureteral jet is identified however. Diffuse ascites. Electronically Signed   By: Nelson Chimes M.D.   On: 03/05/2018 09:27   Ir Paracentesis  Result Date: 03/05/2018 INDICATION: Patient with history of congestive heart failure, stroke, malnutrition, acute kidney injury, suggested retroperitoneal adenopathy on CT, ascites. Request made for diagnostic and therapeutic paracentesis. EXAM: ULTRASOUND GUIDED DIAGNOSTIC AND THERAPEUTIC PARACENTESIS MEDICATIONS: None COMPLICATIONS: None immediate. PROCEDURE: Informed written consent was obtained from the patient after a discussion of  the risks, benefits and alternatives to treatment. A timeout was performed prior to the initiation of the procedure. Initial ultrasound scanning demonstrates a moderate amount of ascites within the left mid to lower abdominal quadrant. The left mid to lower abdomen was prepped and draped in the usual sterile fashion. 2% lidocaine was used for local anesthesia. Following this, a 19 gauge, 7-cm, Yueh catheter was introduced. An ultrasound image was saved for documentation purposes. The paracentesis was performed. The catheter was removed and a dressing was applied. The patient tolerated the procedure well without immediate post procedural complication. FINDINGS: A total of approximately 2.8 liters of yellow fluid was removed. Samples were sent to the laboratory as requested by the clinical team. IMPRESSION: Successful ultrasound-guided diagnostic and therapeutic paracentesis yielding 2.8 liters of peritoneal fluid. Read by: Rowe Adel, PA-C Electronically Signed   By: Sandi Mariscal M.D.   On: 03/05/2018 14:30    ASSESSMENT & PLAN: 84 year old African-American male, with past medical history of stroke, hypertension, peripheral vascular disease, moderate dementia, presented with syncope, anemia, and hypotension.  1. Metastatic gastric  cancer to peritoneum with malignant ascites 2.  Gastric outlet obstruction 3. History of CVA in 08/2017 4. HTN 5. PVD 6. Dementia  7.  Moderate protein and calorie malnutrition and weight loss 8. Anorexia and deconditioning  .  Recommendations: -I have discussed the findings from his EGD, biopsy, and cytology from paracentesis, unfortunately showed metastatic gastric cancer to peritoneum with malignant ascites. -Unfortunately this is not curable disease, the goal of therapy is palliative. -Given his advanced age, medical comorbidities, very limited social support, he is not a candidate for cytotoxic chemotherapy.  Certainly can check PDL 1 and MSI on his gastric mass biopsy, to see if he is a candidate for immunotherapy.  However even if he is a candidate for immunotherapy, it will require multiple office visits, intravenous therapy, and supportive care, which I am not sure if he wants to pursue. -Patient clearly stated that he has made it to age of 52, and is ready to go.  He does not want to go through unnecessary surgeries, procedures, to prolong his life. -Pt strongly prefers he is lifelong friend Rossi and Ezzard Flax to join the discussion about his care plan on discharge.  Per his request, I have called them, and set up a family meeting with [name] and Ezzard Flax at 330 tomorrow afternoon. -palliative consult has been called, but they have not seen the pt yet. I have spoken with Dr. Rowe Pavy, and she will see pt tomorrow.  -I talked to GI Dr. Watt Climes and he will review the endoscopy report to see if pt is a candidate for stent placement for his gastric outlet obstruction. -given the goal of comfort care, I have agreed pt's request to eat, and changed his NPO to clear liquid to see if he can tolerate  -I will f/u tomorrow, and will also discuss if he wants pleurx for his malignant ascites    All questions were answered. The patient knows to call the clinic with any problems, questions or concerns.       Truitt Merle, MD 03/09/2018 2:35 PM

## 2018-03-09 NOTE — Progress Notes (Deleted)
Clinical Social Worker facilitated patient discharge including contacting patient family and facility to confirm patient discharge plans.  Clinical information faxed to facility and family agreeable with plan.  CSW arranged ambulance transport via [facility] to [facility]. RN to call for report prior to discharge [phone] room 234A.  Clinical Social Worker will sign off for now as social work intervention is no longer needed. Please consult [place] again if new need arises.  Mahamad [employer]  [phone]

## 2018-03-09 NOTE — Progress Notes (Signed)
The on-call provider was notified about the patient's PO meds, as pt is on NPO diet status. New order obtained from the provider for metoprolol IV PRN. Pt is alert/oriented X3. Denies any discomfort. Currently, watching TV. No distress noted.

## 2018-03-09 NOTE — Progress Notes (Signed)
PROGRESS NOTE                                                                                                                                                                                                             Patient Demographics:    [name], is a 84 y.o. male, DOB - [birth year], CVE:[id number]  Admit date - 03/03/2018   Admitting Physician Janora Norlander, MD  Outpatient Primary MD for the patient is Patient, [name]  LOS - 6  Outpatient Specialists: none  Chief Complaint  Patient presents with  . Fall       Brief Narrative  84 year old male with history of hypertension, CVA (left inferior MCA infarct in March this year), mild dementia, hyperlipidemia, PAD, BPH resident of [facility] brought to ED by EMS after found on the floor of his facility.  Patient says he may have slipped on something but does not recall and thinks he passed out.  Patient complained of some abdominal discomfort and was trying to strain in the bathroom when he passed out.  Reportedly his systolic blood pressure was in the 80s when he was found. Patient reports being constipated frequently for the past 3 weeks associated with poor p.o. intake, significant weight loss and right-sided abdominal pain with occasional vomiting.  No fevers or chills. In the ED vitals were stable.  EKG showed irregular rhythm with P waves and dropped QRS complexes.  Noted for 450 cc urine in his bladder scan and was unable to urinate.  Blood work showed 2 g drop in H&H with positive Hemoccult, acute kidney injury.  Head CT was negative for acute findings.  03/08/2018: Patient underwent EGD on 03/07/2018.  EGD revealed "Food in the mid esophagus.  A small amount of food (residue) in the stomach.  Likely malignant gastric tumor in the prepyloric region of the stomach". Gastric tomorrow was biopsied.  In essence, patient is documented to have a complete  gastric outlet obstruction.    As per the GI notes, pathology result is said to reveal adenocarcinoma.  There is also documentation that there is likely seeding of adenocarcinoma into the ascitic fluid.  Will consult palliative care team to assist in defining goals of care.  Further management will depend on hospital course.  03/09/2018: Discussed with and updated patient's power of attorney  and patient's power of attorney his wife.  They both understand option of hospice.  Discussed with palliative care team as well.  Hopefully, the palliative care team will see patient tomorrow.  Consulted oncology team, Dr. Burr Medico.  Discussed extensively with the patient, in the presence of floor case manager.  Patient does not want PEG tube or artificial feeding.  Patient tells me that he is like a tablet for 86 years, and did not want any heroic measures.  Further management will depend on hospital course.    Subjective:   No new complaints.  No fever or chills, no chest pain.  Patient was actually very communicative today.     Assessment  & Plan :    Principal Problem: Gastric adenocarcinoma, metastatic: GI input is highly appreciated. Consult palliative care team to assist in defining goals of care. Patient does not want any PEG tube. Consulted oncology, oncology input is highly appreciated. Discussed, and updated patient's power of attorney and power of attorney his wife.   Palliative care will likely see patient in the morning. Oncologist has been meeting with patient and the POA tomorrow.  Oncology team also discussed option of stenting with the GI team. Further management depend on hospital course. Low threshold to consult hospice.   syncope and collapse Likely multifactorial.  Dehydration with overlap of new onset A. fib and cardiomyopathy.   Patient is also chronically ill looking and debilitated.   Blood pressure remained stable.    Active Problems:  AKI (acute kidney injury) ([place]) AKI is  resolving Likely prerenal. Continue to avoid nephrotoxins.   Continue to hold ACE inhibitor.   Strict I's/O and daily weight.   Renal ultrasound without any findings of medical renal disease or obstruction.  .  Abdominal ascites Likely malignant ascites.   Patient is status post paracentesis with 2.8 L yellow fluid removed on 10/1.   Negative for SBP.   SAAG of 0.7 ruling out the possibility of portal hypertension.   Fluid cytology is said to be positive.   Depending on the rate of recurrence of malignant ascites, Pleurx catheter may be considered.  Acute combined systolic and diastolic CHF/?  Nonischemic cardiomyopathy New finding.  2D echo in March this year showed normal EF.  Holding aspirin for concern of GI bleed.  Continue statin.  Holding ACE inhibitor. Plan to add Lasix as renal function permits.  Cardiology consult appreciated.  Iron deficiency anemia/?  Acute blood loss anemia. As almost 4 g drop in hemoglobin from baseline.  Found to be iron deficient.  Hemoccult positive.  GI consult appreciated.  Continue PPI.  Hemoglobin has remained stable in past 2 days.  EGD is as documented above.    Paroxysmal A. fib New finding this admission.  Patient had an event monitor after CVA earlier this year which did not capture any A. fib.  His chads2vasc score is  7 and would need anticoagulation which will be determined following EGD and colonoscopy results.  Cardiology following.  Protein calorie malnutrition, moderate/failure to thrive: Palliative care input. Patient may be hospice appropriate. Nutrition consult appreciated.   Patient reports very poor p.o. intake.  Added supplement.   History of CVA Left inferior MCA infarct in March 2019.  Continue  Lipitor, hold aspirin with concern for GI bleed..  Recent follow-up MRI of the brain done 3 weeks back shows expected evolution of the stroke.  Needs anticoagulation for new onset A. fib and high CHADS2vasc.    Generalized  weakness PT  evaluation.  Reports using both a walker and cane at the facility.  Constipation Continue senna-Colace and MiraLAX.  History of dementia Mild at baseline.  No acute issues.  Code Status : DNR  Family Communication  :   Disposition Plan  : This will depend on goal of care in the hospital course.  Patient may be hospice appropriate.    Barriers For Discharge : Active symptoms  Consults  : [facility], IR, cardiology, oncology.  Procedures  : CT head, CT abdomen without contrast, renal ultrasound, paracentesis, 2D echo, EGD and colonoscopy today.   DVT Prophylaxis  : SCDs Lab Results  Component Value Date   PLT 454 (H) 03/07/2018    Antibiotics  :    Anti-infectives (From admission, onward)   None        Objective:   Vitals:   03/08/18 1208 03/08/18 1933 03/09/18 0422 03/09/18 1223  BP: 119/66 125/65 119/74 120/78  Pulse: 80 81 80 100  Resp: 18 18 18 20   Temp: 99.5 F (37.5 C) (!) 97.4 F (36.3 C) 98.1 F (36.7 C) 98.5 F (36.9 C)  TempSrc: Oral Oral Oral Oral  SpO2: 98% 97% 100% 98%  Weight:   72.6 kg   Height:        Wt Readings from Last 3 Encounters:  03/09/18 72.6 kg  01/30/18 72.6 kg  01/07/18 72.6 kg     Intake/Output Summary (Last 24 hours) at 03/09/2018 1352 Last data filed at 03/09/2018 0958 Gross per 24 hour  Intake 0 ml  Output 700 ml  Net -700 ml   Physical exam Not in any distress.  Very communicative today.    not in distress HEENT: Pallor present, temporal wasting, moist mucosa Chest: Clear bilaterally CVS: S1-S2. Musculoskeletal: Warm, no edema today, Abdomen is distended, soft and nontender.      Data Review:    CBC Recent Labs  Lab 03/03/18 0919 03/04/18 0609 03/05/18 0419 03/06/18 0448 03/07/18 0534 03/08/18 0520  WBC 6.2 4.9 3.8* 4.5 6.0  --   HGB 10.7* 9.4* 8.2* 9.2* 9.9* 9.0*  HCT 36.2* 32.0* 27.8* 31.1* 33.4* 30.1*  PLT 379 409* 362 396 454*  --   MCV 94.0 93.3 93.0 93.7 92.8  --   MCH 27.8  27.4 27.4 27.7 27.5  --   MCHC 29.6* 29.4* 29.5* 29.6* 29.6*  --   RDW 15.0 15.2 15.2 15.1 15.3  --   LYMPHSABS 1.1  --   --   --   --   --   MONOABS 0.4  --   --   --   --   --   EOSABS 0.1  --   --   --   --   --   BASOSABS 0.0  --   --   --   --   --     Chemistries  Recent Labs  Lab 03/03/18 0919 03/04/18 0609 03/05/18 0419 03/06/18 0448 03/07/18 0534 03/08/18 0523  NA 138 139 138 142 139 142  K 4.9 4.0 3.7 4.3 4.2 4.1  CL 103 106 111 111 108 112*  CO2 25 27 24 23 25 24   GLUCOSE 97 111* 105* 80 89 84  BUN 30* 29* 26* 23 22 18   CREATININE 2.34* 2.23* 2.14* 1.72* 1.52* 1.27*  CALCIUM 8.3* 8.2* 7.4* 8.1* 8.2* 8.2*  MG 1.8  --   --   --   --  1.7  AST 19  --   --   --   --   --  ALT 9  --   --   --   --   --   ALKPHOS 98  --   --   --   --   --   BILITOT 1.0  --   --   --   --   --    ------------------------------------------------------------------------------------------------------------------ No results for input(s): CHOL, HDL, LDLCALC, TRIG, CHOLHDL, LDLDIRECT in the last 72 hours.  Lab Results  Component Value Date   HGBA1C 5.2 08/10/2017   ------------------------------------------------------------------------------------------------------------------ No results for input(s): TSH, T4TOTAL, T3FREE, THYROIDAB in the last 72 hours.  Invalid input(s): FREET3 ------------------------------------------------------------------------------------------------------------------ No results for input(s): VITAMINB12, FOLATE, FERRITIN, TIBC, IRON, RETICCTPCT in the last 72 hours.  Coagulation profile No results for input(s): INR, PROTIME in the last 168 hours.  No results for input(s): DDIMER in the last 72 hours.  Cardiac Enzymes Recent Labs  Lab 03/03/18 1416 03/03/18 2022  TROPONINI <0.03 <0.03   ------------------------------------------------------------------------------------------------------------------ No results found for: BNP  Inpatient  Medications  Scheduled Meds: . atorvastatin  20 mg Oral QHS  . cholecalciferol  1,000 Units Oral Daily  . feeding supplement (ENSURE ENLIVE)  237 mL Oral TID BM  . metoprolol tartrate  12.5 mg Oral BID  . mirtazapine  7.5 mg Oral QHS  . multivitamin with minerals  1 tablet Oral Daily  . pantoprazole  40 mg Oral BID  . polyethylene glycol  17 g Oral Daily  . senna-docusate  2 tablet Oral BID  . sertraline  50 mg Oral Daily  . sodium chloride flush  3 mL Intravenous Q12H   Continuous Infusions: . dextrose 5 % and 0.9% NaCl 50 mL/hr (03/08/18 1845)   PRN Meds:.acetaminophen, bisacodyl, lidocaine, metoprolol tartrate, ondansetron **OR** ondansetron (ZOFRAN) IV, senna-docusate  Micro Results Recent Results (from the past 240 hour(s))  MRSA PCR Screening     Status: None   Collection Time: 03/03/18  3:20 PM  Result Value Ref Range Status   MRSA by PCR NEGATIVE NEGATIVE Final    Comment:        The GeneXpert MRSA Assay (FDA approved for NASAL specimens only), is one component of a comprehensive MRSA colonization surveillance program. It is not intended to diagnose MRSA infection nor to guide or monitor treatment for MRSA infections. Performed at [facility], [place] [address], [place], [place] [address]   Culture, body fluid-bottle     Status: None (Preliminary result)   Collection Time: 03/05/18  2:25 PM  Result Value Ref Range Status   Specimen Description PERITONEAL  Final   Special Requests NONE  Final   Culture   Final    NO GROWTH 3 DAYS Performed at [facility], [address] [place], [place] [address]    Report Status PENDING  Incomplete  Gram stain     Status: None   Collection Time: 03/05/18  2:25 PM  Result Value Ref Range Status   Specimen Description PERITONEAL  Final   Special Requests   Final    NONE Performed at [facility], [address], [place], [place] [address]    Gram Stain   Final    FEW WBC PRESENT,BOTH PMN AND  MONONUCLEAR NO ORGANISMS SEEN    Report Status 03/05/2018 FINAL  Final    Radiology Reports Ct Abdomen Wo Contrast  Result Date: 03/03/2018 CLINICAL DATA:  Abdominal mass. Appears to be an abdominal wall hernia versus rectus sheath hematoma or mass. EXAM: CT ABDOMEN WITHOUT CONTRAST TECHNIQUE: Multidetector  CT imaging of the abdomen was performed following the standard protocol without IV contrast. COMPARISON:  February 25, 2016 FINDINGS: Lower chest: Mild atelectasis in the bases.  Small effusions. Hepatobiliary: Pneumobilia is consistent with previous sphincterotomy, unchanged, in the left hepatic lobe. The patient's known left hepatic mass is not well assessed without contrast on today's study. This has the appearance of a hemangioma on previous imaging. The liver is otherwise unremarkable. The contour of the liver is smooth and not nodular. The patient is status post cholecystectomy. Pancreas: Unremarkable. No pancreatic ductal dilatation or surrounding inflammatory changes. Spleen: Normal in size without focal abnormality. Adrenals/Urinary Tract: Adrenal glands are unremarkable. Kidneys are normal, without renal calculi, focal lesion, or hydronephrosis. Stomach/Bowel: The stomach and small bowel are normal without identified obstruction. The colon is decompressed but does contain contrast. No obvious colonic abnormality. The cecum and appendix, if the patient has 1, are below today's film. Vascular/Lymphatic: Atherosclerotic changes in the nonaneurysmal aorta. Probable shotty and mildly enlarged retroperitoneal nodes, poorly evaluated due to lack of contrast and lack of intra-abdominal fat. An apparent left periaortic node on image 35 measures 15 mm in short axis. Other: Diffuse ascites of uncertain etiology. Lipoma in the right abdominal wall as seen on series 3, image 34. No other abdominal wall mass. Tiny periumbilical hernia. No other hernia is noted. No rectus muscle hematoma identified.  Musculoskeletal: No acute or significant osseous findings. IMPRESSION: 1. Diffuse ascites of uncertain etiology. 2. Suggested retroperitoneal adenopathy, poorly evaluated due to lack of contrast and lack of intra-abdominal fat. The nodes are nonspecific but could be reactive or neoplastic. Recommend follow-up after resolution of the patient's ascites for better evaluation. 3. Tiny pleural effusions and mild bibasilar atelectasis. 4. Atherosclerotic changes in the nonaneurysmal aorta. 5. Right abdominal wall lipoma. 6. Tiny periumbilical fat containing hernia. Electronically Signed   By: Dorise Bullion III M.D   On: 03/03/2018 18:17   Dg Chest 2 View  Result Date: 03/03/2018 CLINICAL DATA:  Unwitnessed fall.  Hypertension. EXAM: CHEST - 2 VIEW COMPARISON:  August 09, 2017 FINDINGS: There is atelectatic change in the right mid lower lung zones. There is no edema or consolidation. Heart is borderline enlarged with pulmonary vascularity normal. Aorta is prominent and rather tortuous, stable. There are metallic fragments in the left hemithorax. No pneumothorax. No bone lesions. IMPRESSION: No edema or consolidation. Areas of mild atelectatic change on the right. Stable cardiac silhouette. Aortic prominence and tortuosity likely reflect chronic hypertensive change. Electronically Signed   By: Lowella Grip III M.D.   On: 03/03/2018 11:44   Ct Head Wo Contrast  Result Date: 03/03/2018 CLINICAL DATA:  Unwitnessed fall EXAM: CT HEAD WITHOUT CONTRAST TECHNIQUE: Contiguous axial images were obtained from the base of the skull through the vertex without intravenous contrast. COMPARISON:  Head CT August 09, 2017 and brain MRI February 08, 2018 FINDINGS: Brain: There is moderate diffuse atrophy. There is no intracranial mass, hemorrhage, extra-axial fluid collection, or midline shift. There is evidence of a prior infarct involving much of the left occipital lobe, stable. There is evidence of a prior infarct in the mid  right occipital lobe, stable. There is evidence of a prior infarct at the gray-white junction of the anterior right parietal lobe, stable. Elsewhere there is patchy small vessel disease in the centra semiovale bilaterally. There is no acute appearing infarct. Basal ganglia calcification bilaterally is a physiologic finding. A small calcification in the mid right cerebellum may represent a small granuloma. This finding  was present on prior study. Vascular: There is no hyperdense vessel. There is calcification in the left vertebral artery as well as in both carotid siphon regions. Skull: Bony calvarium appears intact. There is a right parietal scalp hematoma. Sinuses/Orbits: There is opacification in a posterior ethmoid air cell. There is mucosal thickening in several ethmoid air cells bilaterally. There is inward bowing of each medial orbital wall, a finding that may be congenital or may be due to previous trauma. This finding is stable. No intraorbital lesions are evident. Other: Mastoid air cells are clear. IMPRESSION: 1. Right parietal scalp hematoma with underlying bony calvarium intact. 2. Atrophy with supratentorial small vessel disease. Prior infarcts as noted, largest in the occipital lobes. 3.  No mass or hemorrhage.  No extra-axial fluid collection. 4.  Foci of arterial vascular calcification. 5.  Ethmoid sinus disease noted. Electronically Signed   By: Lowella Grip III M.D.   On: 03/03/2018 11:21   Mr Brain Wo Contrast  Result Date: 02/08/2018  [facility] [address] [place], [place] [address] [phone] NEUROIMAGING REPORT STUDY DATE: 02/08/2018 PATIENT NAME: LAVERNE KLUGH DOB: Aug 22, 1929 MRN: [id number] EXAM: MRI Brain without contrast ORDERING CLINICIAN: Venancio Poisson, NP CLINICAL HISTORY: 84 year old man with history of stroke and neurologic symptoms COMPARISON FILMS: MRI 08/10/2017 TECHNIQUE: MRI of the brain without contrast was obtained utilizing 5 mm axial  slices with T1, T2, T2 flair, SWI and diffusion weighted views.  T1 sagittal and T2 coronal views were obtained. CONTRAST: none IMAGING SITE: [facility], [place], [place] FINDINGS: On sagittal images, the spinal cord is imaged caudally to C4 and is normal in caliber.  There are degenerative changes at C3-C4.  The contents of the posterior fossa are of normal size and position.   The pituitary gland and optic chiasm appear normal.    There is mild to moderate generalized cortical atrophy mild corpus callosal atrophy..  There are no abnormal extra-axial collections of fluid.  The cerebellum and brainstem appears normal.  T1 hyperintense focus posteriorly in the pons on the T1 sagittal images not seen on any of the other sequences and likely representing artifact.  The deep gray matter appears normal.  Remote stroke in the MCA distribution on the left predominantly in the posterior temporal lobe and adjacent parietal lobe. Susceptibility weighted images show some microhemorrhages within the left temporoparietal stroke.   Of note, the stroke was acute on the 08/10/2017 MRI and it has shown expected evolution over time.  There is also a remote right parietal stroke unchanged compared to the previous MRI.  There are scattered T2/FLAIR hypertense foci consistent with chronic microvascular ischemic change.  The current diffusion-weighted images are normal. The VIIth/VIIIth nerve complex appears normal.  There are bilateral lens replacements.  The orbits are otherwise normal.  The mastoid air cells appear normal.  The paranasal sinuses appear normal.  Flow voids are identified within the major intracerebral arteries.     This MRI of the brain without contrast shows the following: 1.    Expected evolution of the temporal parietal stroke that was acute on the 08/10/2017 MRI.   There is a mild amount of chronic microhemorrhage noted 2.    Remote right parietal stroke and chronic microvascular ischemic change.  3.    There are no acute findings. INTERPRETING PHYSICIAN: Richard A. Felecia Shelling, MD, PhD, FAAN Certified in  Neuroimaging by [employer]   US Renal  Result Date: 03/05/2018 CLINICAL DATA:  Acute tubular necrosis. EXAM: RENAL / URINARY TRACT ULTRASOUND COMPLETE COMPARISON:  CT 2 days ago. FINDINGS: Right Kidney: Length: 11.3 cm. Echogenicity within normal limits. No mass or hydronephrosis visualized. Left Kidney: Length: 12.0 cm. Echogenicity within normal limits. No mass or hydronephrosis visualized. Bladder: Urine is present within the bladder.  No ureteral jet is visualized. There is ascites diffusely distributed. IMPRESSION: Kidneys appear normal by sonography. Normal size and echogenicity. No obstruction. Urine in the bladder. No ureteral jet is identified however. Diffuse ascites. Electronically Signed   By: Nelson Chimes M.D.   On: 03/05/2018 09:27   Ir Paracentesis  Result Date: 03/05/2018 INDICATION: Patient with history of congestive heart failure, stroke, malnutrition, acute kidney injury, suggested retroperitoneal adenopathy on CT, ascites. Request made for diagnostic and therapeutic paracentesis. EXAM: ULTRASOUND GUIDED DIAGNOSTIC AND THERAPEUTIC PARACENTESIS MEDICATIONS: None COMPLICATIONS: None immediate. PROCEDURE: Informed written consent was obtained from the patient after a discussion of the risks, benefits and alternatives to treatment. A timeout was performed prior to the initiation of the procedure. Initial ultrasound scanning demonstrates a moderate amount of ascites within the left mid to lower abdominal quadrant. The left mid to lower abdomen was prepped and draped in the usual sterile fashion. 2% lidocaine was used for local anesthesia. Following this, a 19 gauge, 7-cm, Yueh catheter was introduced. An ultrasound image was saved for documentation purposes. The paracentesis was performed. The catheter was removed and a dressing was applied. The patient tolerated the  procedure well without immediate post procedural complication. FINDINGS: A total of approximately 2.8 liters of yellow fluid was removed. Samples were sent to the laboratory as requested by the clinical team. IMPRESSION: Successful ultrasound-guided diagnostic and therapeutic paracentesis yielding 2.8 liters of peritoneal fluid. Read by: Rowe Myking, PA-C Electronically Signed   By: Sandi Mariscal M.D.   On: 03/05/2018 14:30    Time Spent in minutes  25   Bonnell Public M.D on 03/09/2018 at 1:52 PM  Between 7am to 7pm - Pager [phone] After 7pm go to [email or web address] - password [facility]  [facility] -  Office  [phone]

## 2018-03-10 DIAGNOSIS — C799 Secondary malignant neoplasm of unspecified site: Secondary | ICD-10-CM

## 2018-03-10 DIAGNOSIS — R188 Other ascites: Secondary | ICD-10-CM

## 2018-03-10 DIAGNOSIS — Z7189 Other specified counseling: Secondary | ICD-10-CM

## 2018-03-10 DIAGNOSIS — Z515 Encounter for palliative care: Secondary | ICD-10-CM

## 2018-03-10 LAB — CULTURE, BODY FLUID W GRAM STAIN -BOTTLE: Culture: NO GROWTH

## 2018-03-10 LAB — GLUCOSE, CAPILLARY: Glucose-Capillary: 105 mg/dL — ABNORMAL HIGH (ref 70–99)

## 2018-03-10 NOTE — Progress Notes (Signed)
PROGRESS NOTE                                                                                                                                                                                                             Patient Demographics:    [name], is a 84 y.o. male, DOB - 02-Oct-1929, RWE:[id number]  Admit date - 03/03/2018   Admitting Physician Janora Norlander, MD  Outpatient Primary MD for the patient is Patient, [name]  LOS - 7  Outpatient Specialists: none  Chief Complaint  Patient presents with  . Fall       Brief Narrative  84 year old male with history of hypertension, CVA (left inferior MCA infarct in March this year), mild dementia, hyperlipidemia, PAD, BPH resident of [facility] brought to ED by EMS after found on the floor of his facility.  Patient says he may have slipped on something but does not recall and thinks he passed out.  Patient complained of some abdominal discomfort and was trying to strain in the bathroom when he passed out.  Reportedly his systolic blood pressure was in the 80s when he was found. Patient reports being constipated frequently for the past 3 weeks associated with poor p.o. intake, significant weight loss and right-sided abdominal pain with occasional vomiting.  No fevers or chills. In the ED vitals were stable.  EKG showed irregular rhythm with P waves and dropped QRS complexes.  Noted for 450 cc urine in his bladder scan and was unable to urinate.  Blood work showed 2 g drop in H&H with positive Hemoccult, acute kidney injury.  Head CT was negative for acute findings.  03/08/2018: Patient underwent EGD on 03/07/2018.  EGD revealed "Food in the mid esophagus.  A small amount of food (residue) in the stomach.  Likely malignant gastric tumor in the prepyloric region of the stomach". Gastric tomorrow was biopsied.  In essence, patient is documented to have a complete  gastric outlet obstruction.    As per the GI notes, pathology result is said to reveal adenocarcinoma.  There is also documentation that there is likely seeding of adenocarcinoma into the ascitic fluid.  Will consult palliative care team to assist in defining goals of care.  Further management will depend on hospital course.  03/09/2018: Discussed with and updated patient's power of attorney  and patient's power of attorney his wife.  They both understand option of hospice.  Discussed with palliative care team as well.  Hopefully, the palliative care team will see patient tomorrow.  Consulted oncology team, Dr. Burr Medico.  Discussed extensively with the patient, in the presence of floor case manager.  Patient does not want PEG tube or artificial feeding.  Patient tells me that he is like a tablet for 86 years, and did not want any heroic measures.  Further management will depend on hospital course.  03/10/2018: Patient seen.  Input from oncology, GI and cardiology appreciated.  For possible stenting of the gastric outlet as per the GI team in 2 days time what was the end of the week.  Cardiology is still holding anticoagulation until stent placement is completed.  Oncology intends to hold a family meeting with the patient's power of attorney letter today.  Otherwise, no new complaints.  Patient is currently on clear liquid diet.     Subjective:   No new complaints.  No fever or chills, no chest pain.  Patient was actually very communicative today.     Assessment  & Plan :    Principal Problem: Gastric adenocarcinoma, metastatic: GI input is highly appreciated. Consult palliative care team to assist in defining goals of care. Patient does not want any PEG tube. Consulted oncology, oncology input is highly appreciated. Discussed, and updated patient's power of attorney and power of attorney his wife.   Palliative care will likely see patient in the morning. Oncologist has been meeting with patient  and the POA tomorrow.  Oncology team also discussed option of stenting with the GI team. For stenting of the gastric outlet by the GI team. Oncology to have meeting with the patient's POA today. Further management depend on hospital course.  syncope and collapse Likely multifactorial.  Dehydration with overlap of new onset A. fib and cardiomyopathy.   Patient is also chronically ill looking and debilitated.   Blood pressure remained stable.    Active Problems:  AKI (acute kidney injury) ([place]) AKI is resolving Likely prerenal. Continue to avoid nephrotoxins.   Continue to hold ACE inhibitor.   Strict I's/O and daily weight.   Renal ultrasound without any findings of medical renal disease or obstruction.  .  Abdominal ascites Likely malignant ascites.   Patient is status post paracentesis with 2.8 L yellow fluid removed on 10/1.   Negative for SBP.   SAAG of 0.7 ruling out the possibility of portal hypertension.   Fluid cytology is said to be positive.   Depending on the rate of recurrence of malignant ascites, Pleurx catheter may be considered.  Acute combined systolic and diastolic CHF/?  Nonischemic cardiomyopathy New finding.  2D echo in March this year showed normal EF.  Holding aspirin for concern of GI bleed.  Continue statin.  Holding ACE inhibitor. Plan to add Lasix as renal function permits.  Cardiology consult appreciated.  No further work-up.  Iron deficiency anemia/?  Acute blood loss anemia. As almost 4 g drop in hemoglobin from baseline.  Found to be iron deficient.  Hemoccult positive.  GI consult appreciated.  Continue PPI.  Hemoglobin has remained stable in past 2 days.  EGD is as documented above.    Paroxysmal A. fib New finding this admission.  Patient had an event monitor after CVA earlier this year which did not capture any A. fib.  His chads2vasc score is  7 and would need anticoagulation which will be determined following  EGD and colonoscopy results.   Cardiology following (kindly see cardiology notes).  For possible anticoagulation after gastric outlet stenting is completed.  Protein calorie malnutrition, moderate/failure to thrive: Palliative care input. Patient may be hospice appropriate. Nutrition consult appreciated.   Patient reports very poor p.o. intake.  Added supplement.   History of CVA Left inferior MCA infarct in March 2019.  Continue  Lipitor, hold aspirin with concern for GI bleed..  Recent follow-up MRI of the brain done 3 weeks back shows expected evolution of the stroke.  Needs anticoagulation for new onset A. fib and high CHADS2vasc.    Generalized weakness PT evaluation.  Reports using both a walker and cane at the facility.  Constipation Continue senna-Colace and MiraLAX.  History of dementia Mild at baseline.  No acute issues.  Code Status : DNR  Family Communication  :   Disposition Plan  : This will depend on goal of care in the hospital course.  Patient may be hospice appropriate.    Barriers For Discharge : Active symptoms  Consults  : [facility], IR, cardiology, oncology.  Procedures  : CT head, CT abdomen without contrast, renal ultrasound, paracentesis, 2D echo, EGD and colonoscopy today.   DVT Prophylaxis  : SCDs Lab Results  Component Value Date   PLT 454 (H) 03/07/2018    Antibiotics  :    Anti-infectives (From admission, onward)   None        Objective:   Vitals:   03/09/18 1223 03/09/18 2019 03/10/18 0621 03/10/18 1050  BP: 120/78 118/74 132/66 112/60  Pulse: 100 85 85 76  Resp: 20 20 20    Temp: 98.5 F (36.9 C) 99 F (37.2 C) 99.2 F (37.3 C)   TempSrc: Oral Oral Oral   SpO2: 98% 96% 98%   Weight:   71.7 kg   Height:        Wt Readings from Last 3 Encounters:  03/10/18 71.7 kg  01/30/18 72.6 kg  01/07/18 72.6 kg     Intake/Output Summary (Last 24 hours) at 03/10/2018 1517 Last data filed at 03/10/2018 0908 Gross per 24 hour  Intake 2279.19 ml  Output 300 ml    Net 1979.19 ml   Physical exam Not in any distress.  Patient is awake and alert.    not in distress HEENT: Pallor present, temporal wasting, moist mucosa Chest: Clear bilaterally CVS: S1-S2. Musculoskeletal: Warm, no edema today, Abdomen is distended, soft and nontender.    Data Review:    CBC Recent Labs  Lab 03/04/18 0609 03/05/18 0419 03/06/18 0448 03/07/18 0534 03/08/18 0520  WBC 4.9 3.8* 4.5 6.0  --   HGB 9.4* 8.2* 9.2* 9.9* 9.0*  HCT 32.0* 27.8* 31.1* 33.4* 30.1*  PLT 409* 362 396 454*  --   MCV 93.3 93.0 93.7 92.8  --   MCH 27.4 27.4 27.7 27.5  --   MCHC 29.4* 29.5* 29.6* 29.6*  --   RDW 15.2 15.2 15.1 15.3  --     Chemistries  Recent Labs  Lab 03/04/18 0609 03/05/18 0419 03/06/18 0448 03/07/18 0534 03/08/18 0523  NA 139 138 142 139 142  K 4.0 3.7 4.3 4.2 4.1  CL 106 111 111 108 112*  CO2 27 24 23 25 24   GLUCOSE 111* 105* 80 89 84  BUN 29* 26* 23 22 18   CREATININE 2.23* 2.14* 1.72* 1.52* 1.27*  CALCIUM 8.2* 7.4* 8.1* 8.2* 8.2*  MG  --   --   --   --  1.7   ------------------------------------------------------------------------------------------------------------------ No results for input(s): CHOL, HDL, LDLCALC, TRIG, CHOLHDL, LDLDIRECT in the last 72 hours.  Lab Results  Component Value Date   HGBA1C 5.2 08/10/2017   ------------------------------------------------------------------------------------------------------------------ No results for input(s): TSH, T4TOTAL, T3FREE, THYROIDAB in the last 72 hours.  Invalid input(s): FREET3 ------------------------------------------------------------------------------------------------------------------ No results for input(s): VITAMINB12, FOLATE, FERRITIN, TIBC, IRON, RETICCTPCT in the last 72 hours.  Coagulation profile No results for input(s): INR, PROTIME in the last 168 hours.  No results for input(s): DDIMER in the last 72 hours.  Cardiac Enzymes Recent Labs  Lab 03/03/18 2022   TROPONINI <0.03   ------------------------------------------------------------------------------------------------------------------ No results found for: BNP  Inpatient Medications  Scheduled Meds: . atorvastatin  20 mg Oral QHS  . cholecalciferol  1,000 Units Oral Daily  . feeding supplement (ENSURE ENLIVE)  237 mL Oral TID BM  . metoprolol tartrate  12.5 mg Oral BID  . mirtazapine  7.5 mg Oral QHS  . multivitamin with minerals  1 tablet Oral Daily  . pantoprazole  40 mg Oral BID  . polyethylene glycol  17 g Oral Daily  . senna-docusate  2 tablet Oral BID  . sertraline  50 mg Oral Daily  . sodium chloride flush  3 mL Intravenous Q12H   Continuous Infusions: . dextrose 5 % and 0.9% NaCl 50 mL/hr (03/08/18 1845)   PRN Meds:.acetaminophen, bisacodyl, lidocaine, metoprolol tartrate, ondansetron **OR** ondansetron (ZOFRAN) IV, senna-docusate  Micro Results Recent Results (from the past 240 hour(s))  MRSA PCR Screening     Status: None   Collection Time: 03/03/18  3:20 PM  Result Value Ref Range Status   MRSA by PCR NEGATIVE NEGATIVE Final    Comment:        The GeneXpert MRSA Assay (FDA approved for NASAL specimens only), is one component of a comprehensive MRSA colonization surveillance program. It is not intended to diagnose MRSA infection nor to guide or monitor treatment for MRSA infections. Performed at [facility], [place] [address], [place], [place] [address]   Culture, body fluid-bottle     Status: None (Preliminary result)   Collection Time: 03/05/18  2:25 PM  Result Value Ref Range Status   Specimen Description PERITONEAL  Final   Special Requests NONE  Final   Culture   Final    NO GROWTH 4 DAYS Performed at [facility], [address], [place], [place] [address]    Report Status PENDING  Incomplete  Gram stain     Status: None   Collection Time: 03/05/18  2:25 PM  Result Value Ref Range Status   Specimen Description PERITONEAL  Final    Special Requests   Final    NONE Performed at [facility], [address] [place], [place] [address]    Gram Stain   Final    FEW WBC PRESENT,BOTH PMN AND MONONUCLEAR NO ORGANISMS SEEN    Report Status 03/05/2018 FINAL  Final    Radiology Reports Ct Abdomen Wo Contrast  Result Date: 03/03/2018 CLINICAL DATA:  Abdominal mass. Appears to be an abdominal wall hernia versus rectus sheath hematoma or mass. EXAM: CT ABDOMEN WITHOUT CONTRAST TECHNIQUE: Multidetector CT imaging of the abdomen was performed following the standard protocol without IV contrast. COMPARISON:  February 25, 2016 FINDINGS: Lower chest: Mild atelectasis in the bases.  Small effusions. Hepatobiliary: Pneumobilia is consistent with previous sphincterotomy, unchanged, in the left hepatic lobe. The patient's known left hepatic mass is not well assessed without contrast  on today's study. This has the appearance of a hemangioma on previous imaging. The liver is otherwise unremarkable. The contour of the liver is smooth and not nodular. The patient is status post cholecystectomy. Pancreas: Unremarkable. No pancreatic ductal dilatation or surrounding inflammatory changes. Spleen: Normal in size without focal abnormality. Adrenals/Urinary Tract: Adrenal glands are unremarkable. Kidneys are normal, without renal calculi, focal lesion, or hydronephrosis. Stomach/Bowel: The stomach and small bowel are normal without identified obstruction. The colon is decompressed but does contain contrast. No obvious colonic abnormality. The cecum and appendix, if the patient has 1, are below today's film. Vascular/Lymphatic: Atherosclerotic changes in the nonaneurysmal aorta. Probable shotty and mildly enlarged retroperitoneal nodes, poorly evaluated due to lack of contrast and lack of intra-abdominal fat. An apparent left periaortic node on image 35 measures 15 mm in short axis. Other: Diffuse ascites of uncertain etiology. Lipoma in the right  abdominal wall as seen on series 3, image 34. No other abdominal wall mass. Tiny periumbilical hernia. No other hernia is noted. No rectus muscle hematoma identified. Musculoskeletal: No acute or significant osseous findings. IMPRESSION: 1. Diffuse ascites of uncertain etiology. 2. Suggested retroperitoneal adenopathy, poorly evaluated due to lack of contrast and lack of intra-abdominal fat. The nodes are nonspecific but could be reactive or neoplastic. Recommend follow-up after resolution of the patient's ascites for better evaluation. 3. Tiny pleural effusions and mild bibasilar atelectasis. 4. Atherosclerotic changes in the nonaneurysmal aorta. 5. Right abdominal wall lipoma. 6. Tiny periumbilical fat containing hernia. Electronically Signed   By: Dorise Bullion III M.D   On: 03/03/2018 18:17   Dg Chest 2 View  Result Date: 03/03/2018 CLINICAL DATA:  Unwitnessed fall.  Hypertension. EXAM: CHEST - 2 VIEW COMPARISON:  August 09, 2017 FINDINGS: There is atelectatic change in the right mid lower lung zones. There is no edema or consolidation. Heart is borderline enlarged with pulmonary vascularity normal. Aorta is prominent and rather tortuous, stable. There are metallic fragments in the left hemithorax. No pneumothorax. No bone lesions. IMPRESSION: No edema or consolidation. Areas of mild atelectatic change on the right. Stable cardiac silhouette. Aortic prominence and tortuosity likely reflect chronic hypertensive change. Electronically Signed   By: Lowella Grip III M.D.   On: 03/03/2018 11:44   Ct Head Wo Contrast  Result Date: 03/03/2018 CLINICAL DATA:  Unwitnessed fall EXAM: CT HEAD WITHOUT CONTRAST TECHNIQUE: Contiguous axial images were obtained from the base of the skull through the vertex without intravenous contrast. COMPARISON:  Head CT August 09, 2017 and brain MRI February 08, 2018 FINDINGS: Brain: There is moderate diffuse atrophy. There is no intracranial mass, hemorrhage, extra-axial fluid  collection, or midline shift. There is evidence of a prior infarct involving much of the left occipital lobe, stable. There is evidence of a prior infarct in the mid right occipital lobe, stable. There is evidence of a prior infarct at the gray-white junction of the anterior right parietal lobe, stable. Elsewhere there is patchy small vessel disease in the centra semiovale bilaterally. There is no acute appearing infarct. Basal ganglia calcification bilaterally is a physiologic finding. A small calcification in the mid right cerebellum may represent a small granuloma. This finding was present on prior study. Vascular: There is no hyperdense vessel. There is calcification in the left vertebral artery as well as in both carotid siphon regions. Skull: Bony calvarium appears intact. There is a right parietal scalp hematoma. Sinuses/Orbits: There is opacification in a posterior ethmoid air cell. There is mucosal thickening in several  ethmoid air cells bilaterally. There is inward bowing of each medial orbital wall, a finding that may be congenital or may be due to previous trauma. This finding is stable. No intraorbital lesions are evident. Other: Mastoid air cells are clear. IMPRESSION: 1. Right parietal scalp hematoma with underlying bony calvarium intact. 2. Atrophy with supratentorial small vessel disease. Prior infarcts as noted, largest in the occipital lobes. 3.  No mass or hemorrhage.  No extra-axial fluid collection. 4.  Foci of arterial vascular calcification. 5.  Ethmoid sinus disease noted. Electronically Signed   By: Lowella Grip III M.D.   On: 03/03/2018 11:21   US Renal  Result Date: 03/05/2018 CLINICAL DATA:  Acute tubular necrosis. EXAM: RENAL / URINARY TRACT ULTRASOUND COMPLETE COMPARISON:  CT 2 days ago. FINDINGS: Right Kidney: Length: 11.3 cm. Echogenicity within normal limits. No mass or hydronephrosis visualized. Left Kidney: Length: 12.0 cm. Echogenicity within normal limits. No mass or  hydronephrosis visualized. Bladder: Urine is present within the bladder.  No ureteral jet is visualized. There is ascites diffusely distributed. IMPRESSION: Kidneys appear normal by sonography. Normal size and echogenicity. No obstruction. Urine in the bladder. No ureteral jet is identified however. Diffuse ascites. Electronically Signed   By: Nelson Chimes M.D.   On: 03/05/2018 09:27   Ir Paracentesis  Result Date: 03/05/2018 INDICATION: Patient with history of congestive heart failure, stroke, malnutrition, acute kidney injury, suggested retroperitoneal adenopathy on CT, ascites. Request made for diagnostic and therapeutic paracentesis. EXAM: ULTRASOUND GUIDED DIAGNOSTIC AND THERAPEUTIC PARACENTESIS MEDICATIONS: None COMPLICATIONS: None immediate. PROCEDURE: Informed written consent was obtained from the patient after a discussion of the risks, benefits and alternatives to treatment. A timeout was performed prior to the initiation of the procedure. Initial ultrasound scanning demonstrates a moderate amount of ascites within the left mid to lower abdominal quadrant. The left mid to lower abdomen was prepped and draped in the usual sterile fashion. 2% lidocaine was used for local anesthesia. Following this, a 19 gauge, 7-cm, Yueh catheter was introduced. An ultrasound image was saved for documentation purposes. The paracentesis was performed. The catheter was removed and a dressing was applied. The patient tolerated the procedure well without immediate post procedural complication. FINDINGS: A total of approximately 2.8 liters of yellow fluid was removed. Samples were sent to the laboratory as requested by the clinical team. IMPRESSION: Successful ultrasound-guided diagnostic and therapeutic paracentesis yielding 2.8 liters of peritoneal fluid. Read by: Rowe Rylan, PA-C Electronically Signed   By: Sandi Mariscal M.D.   On: 03/05/2018 14:30    Time Spent in minutes  25   Bonnell Public M.D on 03/10/2018 at  3:17 PM  Between 7am to 7pm - Pager - [phone] After 7pm go to [email or web address] - password [facility]  [facility] -  Office  [phone]

## 2018-03-10 NOTE — Consult Note (Signed)
Consultation Note Date: 03/10/2018   Patient Name: [name]  DOB: Dec 17, 1929  MRN: [id number]  Age / Sex: 84 y.o., male  PCP: Patient, [name] Referring Physician: Bonnell Public, MD  Reason for Consultation: Establishing goals of care and Psychosocial/spiritual support  HPI/Patient Profile: 84 y.o. male  with past medical history of CVA (08/2017), mild dementia, PAD, CHF (LVEF 35% with severe hypokinesis and g1DD), who was admitted on 03/03/2018 as he was found down after a syncopal event at his skilled nursing facility.  The patient described severe abdominal pain with occasional vomiting. In the ER he was hypotensive, in atrial fibrillation with acute kidney injury.  Work up revealed a gastric outlet obstruction and a 4 cm RUQ mass.  Biopsy of the mass and cytology of the ascites indicate adenocarcinoma.  [name] has been evaluated by Oncology.  He is not a candidate for cytotoxic therapy but is being tested to determine if he is a candidate for immunotherapy.  Gastroenterology has offered to evaluate and potentially place a stent to help relieve gastric outlet obstruction.  Cardiology is considering anticoagulation for atrial fibrillation as he has a high risk of stroke.   PMT has been consulted for goals of care.  Clinical Assessment and Goals of Care:  I have reviewed medical records including EPIC notes, labs and imaging, received report from the care team, assessed the patient and then met with Dr. Burr Medico and the patient's HCPOA Tukker Byrns and his wife Ezzard Flax  to discuss diagnosis prognosis, [name], EOL wishes, disposition and options.  There were actually three separate meetings today.  The first with the patient alone. The second with Dr. Burr Medico and the [facility] and the third with Dr. Burr Medico the [facility] and the patient.  I introduced Palliative Medicine as specialized medical care for people living with  serious illness. It focuses on providing relief from the symptoms and stress of a serious illness. The goal is to improve quality of life for both the patient and the family.  [name] tells me he is nauseated and feels like he's going to vomit - yet he presses on and continues to talk with me.  We discussed a brief life review of the patient.  [name] is originally from [place].  His career was in fine dining.  In his last job he spent 25 years at the [employer].  He enjoys any type of fishing.    As far as functional and nutritional status he was able to walk 275 feet without assistance although he was somewhat unsteady.  He has a walker at home.  He has not been eating well due to nausea and pain, but today he is able to tolerate liquids.  His albumin is 2.0.  We discussed his current illness and what it means in the larger context of his on-going co-morbidities.  Natural disease trajectory and expectations at EOL were discussed. I attempted to elicit values and goals of care important to the patient.  [name] feels he has lived  his life and he does not want to prolong illness and death with aggressive interventions.  He is not a candidate for chemotherapy.  Advanced directives, concepts specific to code status, artifical feeding and hydration, and rehospitalization were considered and discussed.  Previously [name] opted for DNR.  His HCPOA shared that the patient does not want a feeding tube.  Hospice and Palliative Care services outpatient were explained and offered.  Both the patient and HCPOA were accepting of Hospice care at this point.  We discussed disposition.  [name] would like to go to [facility] as soon as he is eligible.  Until then he has decided to return to SNF with Hospice care.  Questions and concerns were addressed. Patient and close friends were encouraged to call with questions or concerns.   Primary Decision Maker:  PATIENT  His HCPOA is Truddie Crumble.     SUMMARY OF RECOMMENDATIONS     HCPOA paperwork is scanned into Epic.  In March 2019 he appointed Truddie Crumble as his HCPOA.  Patient has opted to receive a pleurx catheter to drain ascites as needed   HCPOA does not desire anticoaguation  Barring any further decline - if he is able to tolerate a full liquid diet he will return to SNF with [place].  He is a long term SNF resident with [facility], as such, he is able to immediately start Hospice services.  He would like to transfer to [facility] as soon as he is eligible.  Full comfort measures.  Continue daily miralax.  Hold for diarrhea  Code Status/Advance Care Planning:  DNR   Symptom Management:   Full liquid diet.  Liquid zofran ac & hs please prescribe on discharge  Morphine concentrate sublingual for pain  Evaluate ascites q 3-4 days to determine if he needs to be drained.  (It is better to take smaller amounts more frequently than to take large amounts less frequently)  Medications such as vitamin supplements and cholesterol meds have been eliminated.  Additional Recommendations (Limitations, Scope, Preferences):  Full Comfort Care  Palliative Prophylaxis:   Bowel Regimen  Prognosis:   Likely weeks to possibly months.  Gastric outlet will become obstructed in the near term.  Further he is at high risk for stroke or acute life ending event.      Discharge Planning: [place] with Hospice      Primary Diagnoses: Present on Admission: . Essential hypertension . History of BPH . PAD (peripheral artery disease) ([place]) . Syncope and collapse . Dyslipidemia . Protein-calorie malnutrition ([place]) . Vascular dementia without behavioral disturbance ([place])   I have reviewed the medical record, interviewed the patient and family, and examined the patient. The following aspects are pertinent.  Past Medical History:  Diagnosis Date  . BPH (benign prostatic hyperplasia)   . Cataract    . Essential hypertension 05/08/2013  . Exertional dyspnea    with exertion  . Hypertension   . Mitral valvular regurgitation June 2014   Mild to moderate MR on echocardiogram  . PAD (peripheral artery disease) ([place])  December 2014   Lower extremity Dopplers/ABIs: RABI 0.29, LABI 0.66; bilateral external iliacs with significant diameter reduction; R. SFA occlusive disease throughout into popliteal artery with 0 vessel runoff. Anterior tibial reconstitutes distally. 70-99% reduction in all SFA. One-vessel runoff (anterior tibial)  . Stroke ([place])   . Syncope and collapse 03/03/2018   Social History   Socioeconomic History  . Marital status: Single    Spouse name:  Not on file  . Number of children: Not on file  . Years of education: Not on file  . Highest education level: Not on file  Occupational History  . Occupation: Retired    Fish farm manager: RETIRED  Social Needs  . Financial resource strain: Not hard at all  . Food insecurity:    Worry: Never true    Inability: Never true  . Transportation needs:    Medical: No    Non-medical: No  Tobacco Use  . Smoking status: Former Smoker    Types: Cigarettes    Last attempt to quit: 11/25/1980    Years since quitting: 37.3  . Smokeless tobacco: Never Used  Substance and Sexual Activity  . Alcohol use: Not Currently    Alcohol/week: 4.0 standard drinks    Types: 4 Standard drinks or equivalent per week    Comment: beers  . Drug use: No  . Sexual activity: Not Currently  Lifestyle  . Physical activity:    Days per week: 0 days    Minutes per session: 0 min  . Stress: Not at all  Relationships  . Social connections:    Talks on phone: Once a week    Gets together: Once a week    Attends religious service: Never    Active member of club or organization: No    Attends meetings of clubs or organizations: Never    Relationship status: Never married  Other Topics Concern  . Not on file  Social History Narrative   He is originally  from [place]. He has a history of long-standing tobacco use but quit several years ago. He also has a history of former alcohol abuse as well.   He is retired and currently single.   Family History  Problem Relation Age of Onset  . Diabetes Sister   . Cancer Sister   . Cancer Mother   . Cancer Father   . Cancer Sister   . Heart disease Brother    Scheduled Meds: . atorvastatin  20 mg Oral QHS  . cholecalciferol  1,000 Units Oral Daily  . feeding supplement (ENSURE ENLIVE)  237 mL Oral TID BM  . metoprolol tartrate  12.5 mg Oral BID  . mirtazapine  7.5 mg Oral QHS  . multivitamin with minerals  1 tablet Oral Daily  . pantoprazole  40 mg Oral BID  . polyethylene glycol  17 g Oral Daily  . senna-docusate  2 tablet Oral BID  . sertraline  50 mg Oral Daily  . sodium chloride flush  3 mL Intravenous Q12H   Continuous Infusions: . dextrose 5 % and 0.9% NaCl 50 mL/hr (03/08/18 1845)   PRN Meds:.acetaminophen, bisacodyl, lidocaine, metoprolol tartrate, ondansetron **OR** ondansetron (ZOFRAN) IV, senna-docusate No Known Allergies Review of Systems complains of stomach pain and nausea, weakness  Physical Exam  Pleasant well developed man,  Elderly with poor dentition, awake, alert, coherent  Vital Signs: BP 132/66 (BP Location: Left Arm)   Pulse 85   Temp 99.2 F (37.3 C) (Oral)   Resp 20   Ht 5' 8" (1.727 m)   Wt 71.7 kg   SpO2 98%   BMI 24.02 kg/m  Pain Scale: 0-10   Pain Score: 0-No pain   SpO2: SpO2: 98 % O2 Device:SpO2: 98 % O2 Flow Rate: .   IO: Intake/output summary:   Intake/Output Summary (Last 24 hours) at 03/10/2018 0736 Last data filed at 03/10/2018 0700 Gross per 24 hour  Intake 2279.19 ml  Output  401 ml  Net 1878.19 ml    LBM: Last BM Date: 03/08/18 Baseline Weight: Weight: 71.5 kg(Scale A) Most recent weight: Weight: 71.7 kg     Palliative Assessment/Data: 60%       Total Time 90 min  Time in 9:30 Time out 10:00 Time in 3:30 Time out  4:00  Tim in: 4:00  Time out 4:30 Greater than 50%  of this time was spent counseling and coordinating care related to the above assessment and plan.  Signed by: Florentina Jenny, PA-C Palliative Medicine Pager: [phone]  Please contact Palliative Medicine Team phone at [phone] for questions and concerns.  For individual provider: See Shea Evans

## 2018-03-10 NOTE — Progress Notes (Signed)
Progress Note  Patient Name: KAYVON MO Date of Encounter: 03/08/2018  Primary Cardiologist: Pixie Casino, MD   Subjective   No acute events overnight. Denies chest pain, shortness of breath. Denies abdominal pain. Discussed the results and plan based on the GI and oncology evaluations.  Inpatient Medications    Scheduled Meds: . atorvastatin  20 mg Oral QHS  . cholecalciferol  1,000 Units Oral Daily  . feeding supplement (ENSURE ENLIVE)  237 mL Oral TID BM  . metoprolol tartrate  12.5 mg Oral BID  . mirtazapine  7.5 mg Oral QHS  . multivitamin with minerals  1 tablet Oral Daily  . pantoprazole  40 mg Oral BID  . polyethylene glycol  17 g Oral Daily  . senna-docusate  2 tablet Oral BID  . sertraline  50 mg Oral Daily  . sodium chloride flush  3 mL Intravenous Q12H   Continuous Infusions: . dextrose 5 % and 0.9% NaCl 50 mL/hr (03/08/18 1845)   PRN Meds: acetaminophen, bisacodyl, lidocaine, metoprolol tartrate, ondansetron **OR** ondansetron (ZOFRAN) IV, senna-docusate   Vital Signs    Vitals:   03/09/18 1223 03/09/18 2019 03/10/18 0621 03/10/18 1050  BP: 120/78 118/74 132/66 112/60  Pulse: 100 85 85 76  Resp: 20 20 20    Temp: 98.5 F (36.9 C) 99 F (37.2 C) 99.2 F (37.3 C)   TempSrc: Oral Oral Oral   SpO2: 98% 96% 98%   Weight:   71.7 kg   Height:        Intake/Output Summary (Last 24 hours) at 03/10/2018 1213 Last data filed at 03/10/2018 0908 Gross per 24 hour  Intake 2279.19 ml  Output 701 ml  Net 1578.19 ml   Filed Weights   03/08/18 0500 03/09/18 0422 03/10/18 0621  Weight: 71.2 kg 72.6 kg 71.7 kg    ECG    No new tracings- Personally Reviewed Telemetry: sinus with PACs--personally reviewed  Physical Exam   Constitutional: No acute distress ENMT:poordentition, moist mucous membranes Cardiovascular:irregularly irregularrhythm, normal rate, no murmurs. S1 and S2 normal. No jugular venous distention.  Respiratory: clear to  auscultation bilaterally except for faint crackles at left base GI : normal bowel sounds, soft and nontender.Distended but not tense.  MSK: extremities warm, well perfused.Traceedema.  NEURO: grossly nonfocal exam, moves all extremities. PSYCH: alert and oriented x 3, normal mood and affect.   Labs    Chemistry Recent Labs  Lab 03/06/18 0448 03/07/18 0534 03/08/18 0523  NA 142 139 142  K 4.3 4.2 4.1  CL 111 108 112*  CO2 23 25 24   GLUCOSE 80 89 84  BUN 23 22 18   CREATININE 1.72* 1.52* 1.27*  CALCIUM 8.1* 8.2* 8.2*  ALBUMIN  --   --  2.0*  GFRNONAA 34* 40* 49*  GFRAA 40* 46* 57*  ANIONGAP 8 6 6      Hematology Recent Labs  Lab 03/05/18 0419 03/06/18 0448 03/07/18 0534 03/08/18 0520  WBC 3.8* 4.5 6.0  --   RBC 2.99* 3.32* 3.60*  --   HGB 8.2* 9.2* 9.9* 9.0*  HCT 27.8* 31.1* 33.4* 30.1*  MCV 93.0 93.7 92.8  --   MCH 27.4 27.7 27.5  --   MCHC 29.5* 29.6* 29.6*  --   RDW 15.2 15.1 15.3  --   PLT 362 396 454*  --     Cardiac Enzymes Recent Labs  Lab 03/03/18 1416 03/03/18 2022  TROPONINI <0.03 <0.03    No results for input(s): TROPIPOC in the  last 168 hours.   BNPNo results for input(s): BNP, PROBNP in the last 168 hours.   DDimer No results for input(s): DDIMER in the last 168 hours.   Radiology    No results found.  Cardiac Studies   Echo 03/04/18 Study Conclusions - Left ventricle: Difficult study Frequent ectopy. LVEF is approximately 35% with severe hypokinesis of the inferior, distal anterior and apical walls; hypokinesis of the inferoseptal wall. The cavity size was normal. Wall thickness was increased in a pattern of mild LVH. Doppler parameters are consistent with abnormal left ventricular relaxation (grade 1 diastolic dysfunction). - Aortic valve: There was trivial regurgitation. Valve area (Vmax): 2.59 cm^2. - Left atrium: The atrium was moderately dilated. - Pulmonary arteries: PA peak pressure: 34 mm Hg (S). -  Pericardium, extracardiac: There was a right pleural effusion.  Patient Profile     84 y.o. male with a hx of HTN, PAD, CVA, dementia admitted after found down at his facility with hypotensionwho is being followed for syncope, paroxysmal atrial fibrillation and acute systolic heart failure.   Assessment & Plan    1. PAF:  -intermittent sinus rhythm/sinus arrhythmia and atrial fibrillation. Hstory of CVA in 08/2017 -Metoprolol 12.5mg  twice daily started 03/06/18 with stable BP. He is currently rate controlled. -CHA2DS2VASc =7 places him at high risk for stroke. Given plans for GI stent later this week, would hold on anticoagulation restart for now. I did discuss with him that we can re-evaluate [facility] after the stent is placed, based on his bleeding risk. He is amenable to this.   2. Acute systolic CHF: -Echocardiogram with LVEF of 35% with diffuse hypokinesis of the inferoseptal wall and G1DD>>last echo in 08/2017 with LV of 60-65% -unclear if this is ischemic (though no symptoms) versus nonischemic. In the setting of palliative care discussions with metastatic poorly differentiated gastric adenocarcinoma, he would not likely benefit from cath. Will pursue medical management. On beta blocker. Renal function improving, but blood pressure borderline. If his BP allows, will consider low dose ACEI.  3.  Management per primary team/inactive issues New diagnosis of poor differentiated, metastatic gastric adenocarcinoma: pursuing palliative treatment Ascites: malignant on cytology -s/p paracentesis on 03/05/18 with 2.8L removed  AKI: improving -Avoid nephrotoxic medications and monitor with daily BMET GI bleed/Anemia: - heme positive stools, EGD with gastric adenocarcinoma as above.   Syncope: no further events -Likely in the setting of hypotension versus AF vs anemia, pt with no recollection of events  -Last wore monitor earlier this year which was found to be unrevealing -Troponin negative x3  with no ACS symptoms   For questions or updates, please contact [facility] Please consult [email or web address] for contact info under    Signed, Buford Dresser, MD  03/08/2018

## 2018-03-10 NOTE — Progress Notes (Signed)
Alfonso Carden Compere   DOB:08/08/1929   DG#:[id number]   RJJ#:[id number]  Oncology follow up   Subjective: Mr. Sermeno is tolerating liquid diet very well, no significant nausea, no vomiting, is able to move around in his room.  No other new complaints.   Objective:  Vitals:   03/10/18 1050 03/10/18 1531  BP: 112/60 108/76  Pulse: 76 91  Resp:  18  Temp:  98.9 F (37.2 C)  SpO2:  100%    Body mass index is 24.02 kg/m.  Intake/Output Summary (Last 24 hours) at 03/10/2018 1635 Last data filed at 03/10/2018 0908 Gross per 24 hour  Intake 2279.19 ml  Output 300 ml  Net 1979.19 ml     Sclerae unicteric  Oropharynx clear  No peripheral adenopathy  Lungs clear -- no rales or rhonchi  Heart regular rate and rhythm  Abdomen moderately distended, soft, nontender.  Moderate amount of ascites.  MSK no focal spinal tenderness, no peripheral edema  Neuro nonfocal   CBG (last 3)  Recent Labs    03/08/18 0949 03/09/18 0632 03/10/18 0638  GLUCAP 85 91 105*     Labs:  Lab Results  Component Value Date   WBC 6.0 03/07/2018   HGB 9.0 (L) 03/08/2018   HCT 30.1 (L) 03/08/2018   MCV 92.8 03/07/2018   PLT 454 (H) 03/07/2018   NEUTROABS 4.5 03/03/2018   CMP Latest Ref Rng & Units 03/08/2018 03/07/2018 03/06/2018  Glucose 70 - 99 mg/dL 84 89 80  BUN 8 - 23 mg/dL 18 22 23   Creatinine 0.61 - 1.24 mg/dL 1.27(H) 1.52(H) 1.72(H)  Sodium 135 - 145 mmol/L 142 139 142  Potassium 3.5 - 5.1 mmol/L 4.1 4.2 4.3  Chloride 98 - 111 mmol/L 112(H) 108 111  CO2 22 - 32 mmol/L 24 25 23   Calcium 8.9 - 10.3 mg/dL 8.2(L) 8.2(L) 8.1(L)  Total Protein 6.5 - 8.1 g/dL - - -  Total Bilirubin 0.3 - 1.2 mg/dL - - -  Alkaline Phos 38 - 126 U/L - - -  AST 15 - 41 U/L - - -  ALT 0 - 44 U/L - - -     Urine Studies No results for input(s): UHGB, CRYS in the last 72 hours.  Invalid input(s): UACOL, UAPR, USPG, UPH, UTP, UGL, [place], UBIL, UNIT, UROB, [place], UEPI, UWBC, Duwayne Heck [place], [place]  Basic Metabolic  Panel: Recent Labs  Lab 03/04/18 [phone] 03/05/18 0419 03/06/18 0448 03/07/18 0534 03/08/18 0523  NA 139 138 142 139 142  K 4.0 3.7 4.3 4.2 4.1  CL 106 111 111 108 112*  CO2 27 24 23 25 24   GLUCOSE 111* 105* 80 89 84  BUN 29* 26* 23 22 18   CREATININE 2.23* 2.14* 1.72* 1.52* 1.27*  CALCIUM 8.2* 7.4* 8.1* 8.2* 8.2*  MG  --   --   --   --  1.7  PHOS  --   --   --   --  3.6   GFR Estimated Creatinine Clearance: 40.4 mL/min (A) (by C-G formula based on SCr of 1.27 mg/dL (H)). Liver Function Tests: Recent Labs  Lab 03/08/18 0523  ALBUMIN 2.0*   No results for input(s): LIPASE, AMYLASE in the last 168 hours. No results for input(s): AMMONIA in the last 168 hours. Coagulation profile No results for input(s): INR, PROTIME in the last 168 hours.  CBC: Recent Labs  Lab 03/04/18 0609 03/05/18 0419 03/06/18 0448 03/07/18 0534 03/08/18 0520  WBC 4.9 3.8* 4.5 6.0  --  HGB 9.4* 8.2* 9.2* 9.9* 9.0*  HCT 32.0* 27.8* 31.1* 33.4* 30.1*  MCV 93.3 93.0 93.7 92.8  --   PLT 409* 362 396 454*  --    Cardiac Enzymes: Recent Labs  Lab 03/03/18 2022  TROPONINI <0.03   BNP: Invalid input(s): POCBNP CBG: Recent Labs  Lab 03/07/18 0754 03/07/18 0922 03/08/18 0949 03/09/18 0632 03/10/18 0638  GLUCAP 67* 97 85 91 105*   D-Dimer No results for input(s): DDIMER in the last 72 hours. Hgb A1c No results for input(s): HGBA1C in the last 72 hours. Lipid Profile No results for input(s): CHOL, HDL, LDLCALC, TRIG, CHOLHDL, LDLDIRECT in the last 72 hours. Thyroid function studies No results for input(s): TSH, T4TOTAL, T3FREE, THYROIDAB in the last 72 hours.  Invalid input(s): FREET3 Anemia work up No results for input(s): VITAMINB12, FOLATE, FERRITIN, TIBC, IRON, RETICCTPCT in the last 72 hours. Microbiology Recent Results (from the past 240 hour(s))  MRSA PCR Screening     Status: None   Collection Time: 03/03/18  3:20 PM  Result Value Ref Range Status   MRSA by PCR NEGATIVE  NEGATIVE Final    Comment:        The GeneXpert MRSA Assay (FDA approved for NASAL specimens only), is one component of a comprehensive MRSA colonization surveillance program. It is not intended to diagnose MRSA infection nor to guide or monitor treatment for MRSA infections. Performed at [facility], [place] [address], [place], [place] [address]   Culture, body fluid-bottle     Status: None   Collection Time: 03/05/18  2:25 PM  Result Value Ref Range Status   Specimen Description PERITONEAL  Final   Special Requests NONE  Final   Culture   Final    NO GROWTH 5 DAYS Performed at [facility], [address] [place], [place] [address]    Report Status 03/10/2018 FINAL  Final  Gram stain     Status: None   Collection Time: 03/05/18  2:25 PM  Result Value Ref Range Status   Specimen Description PERITONEAL  Final   Special Requests   Final    NONE Performed at [facility], [place] [address], [place], [place] [address]    Gram Stain   Final    FEW WBC PRESENT,BOTH PMN AND MONONUCLEAR NO ORGANISMS SEEN    Report Status 03/05/2018 FINAL  Final      Studies:  No results found.  Assessment: 84 y.o. 84 year old African-American male, with past medical history of stroke, hypertension, peripheral vascular disease, moderate dementia, presented with syncope, anemia, and hypotension.  1. Metastatic gastric cancer to peritoneum with malignant ascites 2.  Gastric outlet obstruction 3. History of CVA in 08/2017 4. HTN 5. PVD 6. Dementia  7.  Moderate protein and calorie malnutrition and weight loss 8. Anorexia and deconditioning    Plan:  -Palliative care NP Haynes Dage and I had family meeting with pt, and his POA Mr and Mrs Oletta Lamas this afternoon. We reviewed his diagnosis, and overall very poor prognosis of the stage IV gastric cancer. Both pt and his POA agree with palliative care alone for his cancer, which is very appropriate for him.  -we review the  palliative procedure (1) gastric stent placement for his outlet obstruction (2) pleurx for his recurrent malignant ascites. I explained the procedures to pt and his POW in details, and patient decided not to pursue gastric stent placement.  He is agreeable with Pleurx placement.  I have informed  to Dr. Watt Climes, and will contact IR for Pleurx placement if feasible. -Patient agrees to [facility] with hospice on board.  Palliative care nurse practitioner Haynes Dage will help to arrange -I will be happy to remain as his MD when he is under hospice care. -I will follow-up as needed.   I spent a total of 70 mins for his visit today, >50% on face-to-face counseling.   Truitt Merle, MD 03/10/2018  4:35 PM

## 2018-03-10 NOTE — Progress Notes (Signed)
[name] 9:39 AM  Subjective: Patient tolerating clear liquids and discussed his tumor and we long talk stent placement he has no complaints or questions  Objective: Vital signs stable afebrile no acute distress abdomen soft nontender good bowel sounds no succussion splash no new labs  Assessment: Gastric outlet obstruction secondary to gastric mass  Plan: The risks benefits methods and difficulty of placing a stent was discussed Unfortunately we do not have a stent in stock at this hospital but checked later today at [facility] long we have the appropriate stent I will proceed 730 on Tuesday otherwise it will be later in the week [facility]  Pager [phone] After 5PM or if no answer call [phone]

## 2018-03-10 NOTE — Progress Notes (Signed)
Full note to follow.  Met with Dr. Burr Medico, Oncology, the patient and his close friends, the [place].  [name] is also [name]'s HCPOA.  Dr. Burr Medico explained his current medical situation, the stent and the Pleurx cath.  The patient was concerned about the risks associated with stent placement and declined that procedure.  He understands that he will be able to drink liquids and eventually he will become completely obstructed and unable to eat.  He does want the pleurx cath placement to drain ascites at the nursing facility.  Anticoagulation was discussed with Dr. Burr Medico and with the patient's HCPOA.  It was explained that he is at very high risk for stroke due to cancer and atrial fibrillation.  It was declined due to risk of GI bleeding.  Patient wants to return to [place] with Hospice services after the pleurx cath is placed.  We talked about end of life care at hospice house.  Patient is aware of [facility] - "I remember when then built it" - and wants to transfer there from [place] as soon as he is eligible.  Florentina Jenny, PA-C Palliative Medicine Pager: [phone]  Total Time 90 min  Time in 9:30 Time out 10:00 Time in 3:30 Time out 4:30

## 2018-03-11 LAB — PROTIME-INR
INR: 1.16
Prothrombin Time: 14.7 seconds (ref 11.4–15.2)

## 2018-03-11 LAB — GLUCOSE, CAPILLARY: Glucose-Capillary: 89 mg/dL (ref 70–99)

## 2018-03-11 MED ORDER — MORPHINE SULFATE (CONCENTRATE) 10 MG/0.5ML PO SOLN: 5.0000 mg | ORAL | Status: DC | PRN

## 2018-03-11 MED ORDER — DEXTROSE-NACL 5-0.9 % IV SOLN
INTRAVENOUS | Status: AC
  Administered 2018-03-11 – 2018-03-12 (×2): via INTRAVENOUS

## 2018-03-11 MED ORDER — ONDANSETRON HCL 4 MG/5ML PO SOLN
4.0000 mg | Freq: Three times a day (TID) | ORAL | Status: DC
  Administered 2018-03-11 – 2018-03-13 (×7): 4 mg via ORAL
  Filled 2018-03-11 (×10): qty 5

## 2018-03-11 MED ORDER — CEFAZOLIN SODIUM-DEXTROSE 2-4 GM/100ML-% IV SOLN
2.0000 g | INTRAVENOUS | Status: AC
  Administered 2018-03-12: 2 g via INTRAVENOUS
  Filled 2018-03-11: qty 100

## 2018-03-11 NOTE — Progress Notes (Signed)
[facility] PROGRESS NOTE  [name] DDU:[id number] DOB: [birth year] DOA: 03/03/2018 PCP: Patient, [name]  Brief  summary   84 year old male with history of hypertension, CVA (left inferior MCA infarct in March this year), mild dementia, hyperlipidemia, PAD, BPH resident of [place] [facility] brought to ED by EMS after found on the floor of his facility.  Patient says he may have slipped on something but does not recall and thinks he passed out.  Patient complained of some abdominal discomfort and was trying to strain in the bathroom when he passed out.  Reportedly his systolic blood pressure was in the 80s when he was found. Patient reports being constipated frequently for the past 3 weeks associated with poor p.o. intake, significant weight loss and right-sided abdominal pain with occasional vomiting. Head CT was negative for acute findings. 03/08/2018: Patient underwent EGD on 03/07/2018.  EGD revealed "Food in the mid esophagus.  A small amount of food (residue) in the stomach.  Likely malignant gastric tumor in the prepyloric region of the stomach".  Pathology result is said to reveal adenocarcinoma.+ ascitic fluid with stage IV cancer.  03/09/2018: Discussed with and updated patient's power of attorney and patient's power of attorney his wife.  They both understand option of hospice.  Discussed with palliative care team as well. oncology team, Dr. Burr Medico.  Discussed extensively with the patient, in the presence of floor case manager.  Patient does not want PEG tube or artificial feeding, denied stenting of the gastric outlet. Awaiting palliative pleurex placement by IR  Assessment/Plan:  Gastric adenocarcinoma, metastatic: stage IV. Prognosis is poor. Patient does not want any PEG tube, does not want gastric outlet stenting. Consulted oncology, oncology input is highly appreciated. Discussed, and updated patient's power of attorney and power of attorney his wife. Plan for palliative  pleurex, and hospice care at discharge   Syncope and collapse. Likely multifactorial.  Dehydration with overlap of new onset A. fib and cardiomyopathy. Patient is also chronically ill looking and debilitated. Advanced stage iv cancer.  fall risk. Bleeding risk, gi bleed. He is not a good candidate for anticoagulation  Blood pressure remained stable.    AKI (acute kidney injury) ([place]). Improved with IV fluids. Continue to avoid nephrotoxins. Continue to hold ACE inhibitor.    Abdominal ascites malignant ascites. Patient is status post paracentesis with 2.8 L yellow fluid removed on 10/1.  Negative for SBP.  SAAG of 0.7 ruling out the possibility of portal hypertension. Fluid cytology is positive for malignancy.  -IR to place pleurx to avoid recurrence of malignant ascites  Acute combined systolic and diastolic CHF/?  Nonischemic cardiomyopathy. New finding.  2D echo in March this year showed normal EF.  Holding aspirin for concern of GI bleed.  Continue statin.  Holding ACE inhibitor due to renal issues. Plan to add Lasix as renal function permits.  Cardiology consult appreciated.  No further work-up.  Iron deficiency anemia/?  Acute blood loss anemia. As almost 4 g drop in hemoglobin from baseline.  Found to be iron deficient.  Hemoccult positive.  GI consult appreciated.  Continue PPI.  Hemoglobin has remained stable in past 2 days. EGD is as documented above.    Paroxysmal A. Fib New finding this admission.  Patient had an event monitor after CVA earlier this year which did not capture any A. fib.  patient is not a good candidate for anticoagulation as above  Protein calorie malnutrition, moderate/failure to thrive: Palliative care input. hospice appropriate.  History of CVA. Left inferior MCA infarct in March 2019.  Continue  Lipitor, hold aspirin with concern for GI bleed..  Recent follow-up MRI of the brain done 3 weeks back shows expected evolution of the stroke.    Generalized  weakness PT evaluation.  Reports using both a walker and cane at the facility.  Constipation. Continue senna-Colace and MiraLAX.  History of dementia Mild at baseline.  No acute issues.  Code Status: DNR Family Communication: d/w patient, RN (indicate person spoken with, relationship, and if by phone, the number) Disposition Plan: SNF with hospice.    Consultants:  Oncology  IR  Cardiology   Procedures:  EGD  Pleurex pend   Antibiotics: Anti-infectives (From admission, onward)   None      HPI/Subjective: No distress. Alert. Denies acute pains. Awaiting IR procedure   Objective: Vitals:   03/10/18 1933 03/11/18 0528  BP: 129/76 (!) 144/70  Pulse: 89 80  Resp: 18 18  Temp: 98.3 F (36.8 C) 98.9 F (37.2 C)  SpO2: 100% 99%    Intake/Output Summary (Last 24 hours) at 03/11/2018 0959 Last data filed at 03/11/2018 0600 Gross per 24 hour  Intake 1421.79 ml  Output 300 ml  Net 1121.79 ml   Filed Weights   03/09/18 0422 03/10/18 0621 03/11/18 0528  Weight: 72.6 kg 71.7 kg 67.6 kg    Exam:   General:  No distress   Cardiovascular: s1,s2 rrr  Respiratory: CAT BL  Abdomen: distended. NT  Musculoskeletal: no leg edema    Data Reviewed: Basic Metabolic Panel: Recent Labs  Lab 03/05/18 0419 03/06/18 0448 03/07/18 0534 03/08/18 0523  NA 138 142 139 142  K 3.7 4.3 4.2 4.1  CL 111 111 108 112*  CO2 24 23 25 24   GLUCOSE 105* 80 89 84  BUN 26* 23 22 18   CREATININE 2.14* 1.72* 1.52* 1.27*  CALCIUM 7.4* 8.1* 8.2* 8.2*  MG  --   --   --  1.7  PHOS  --   --   --  3.6   Liver Function Tests: Recent Labs  Lab 03/08/18 0523  ALBUMIN 2.0*   No results for input(s): LIPASE, AMYLASE in the last 168 hours. No results for input(s): AMMONIA in the last 168 hours. CBC: Recent Labs  Lab 03/05/18 0419 03/06/18 0448 03/07/18 0534 03/08/18 0520  WBC 3.8* 4.5 6.0  --   HGB 8.2* 9.2* 9.9* 9.0*  HCT 27.8* 31.1* 33.4* 30.1*  MCV 93.0 93.7 92.8  --    PLT 362 396 454*  --    Cardiac Enzymes: No results for input(s): CKTOTAL, CKMB, CKMBINDEX, TROPONINI in the last 168 hours. BNP (last 3 results) No results for input(s): BNP in the last 8760 hours.  ProBNP (last 3 results) No results for input(s): PROBNP in the last 8760 hours.  CBG: Recent Labs  Lab 03/07/18 0922 03/08/18 0949 03/09/18 2683 03/10/18 0638 03/11/18 0807  GLUCAP 97 85 91 105* 89    Recent Results (from the past 240 hour(s))  MRSA PCR Screening     Status: None   Collection Time: 03/03/18  3:20 PM  Result Value Ref Range Status   MRSA by PCR NEGATIVE NEGATIVE Final    Comment:        The GeneXpert MRSA Assay (FDA approved for NASAL specimens only), is one component of a comprehensive MRSA colonization surveillance program. It is not intended to diagnose MRSA infection nor to guide or monitor treatment for MRSA infections. Performed at  [facility], [place] [address] [place], [place] [address]   Culture, body fluid-bottle     Status: None   Collection Time: 03/05/18  2:25 PM  Result Value Ref Range Status   Specimen Description PERITONEAL  Final   Special Requests NONE  Final   Culture   Final    NO GROWTH 5 DAYS Performed at [facility], [address] [place], [place] [address]    Report Status 03/10/2018 FINAL  Final  Gram stain     Status: None   Collection Time: 03/05/18  2:25 PM  Result Value Ref Range Status   Specimen Description PERITONEAL  Final   Special Requests   Final    NONE Performed at [facility], [place] [address] [place], [place] [address]    Gram Stain   Final    FEW WBC PRESENT,BOTH PMN AND MONONUCLEAR NO ORGANISMS SEEN    Report Status 03/05/2018 FINAL  Final     Studies: No results found.  Scheduled Meds: . feeding supplement (ENSURE ENLIVE)  237 mL Oral TID BM  . metoprolol tartrate  12.5 mg Oral BID  . mirtazapine  7.5 mg Oral QHS  . ondansetron  4 mg Oral TID AC & HS  . pantoprazole  40  mg Oral BID  . polyethylene glycol  17 g Oral Daily  . sertraline  50 mg Oral Daily  . sodium chloride flush  3 mL Intravenous Q12H   Continuous Infusions: . dextrose 5 % and 0.9% NaCl 50 mL/hr at 03/10/18 1653    Principal Problem:   Syncope and collapse Active Problems:   History of BPH   Right upper quadrant abdominal mass   Essential hypertension   PAD (peripheral artery disease) (HCC)   Dyslipidemia   Protein-calorie malnutrition (HCC)   Vascular dementia without behavioral disturbance (HCC)   Constipation   AKI (acute kidney injury) ([place])   Anemia   Malnutrition of moderate degree   Acute systolic heart failure (HCC)   Paroxysmal atrial fibrillation ([place])   Ascites   Metastatic cancer [facility])   Palliative care encounter   Encounter for hospice care discussion    Time spent: >35 minutes     Kinnie Feil  [facility] Pager [phone]. If 7PM-7AM, please contact night-coverage at [email or web address], password [facility] 03/11/2018, 9:59 AM  LOS: 8 days

## 2018-03-11 NOTE — Care Management Important Message (Signed)
Important Message  Patient Details  Name: [name] MRN: [id number] Date of Birth: 1929-08-03   Medicare Important Message Given:  Yes    Vanderbilt Ranieri P Mariany Mackintosh 03/11/2018, 2:52 PM

## 2018-03-11 NOTE — Progress Notes (Signed)
Patient is asking about procedure, paged IR PA and asked about today's schedule. Per PA procedure will be not done today, they will evaulate in the morning. Verbal order given to cancel NPO order and placed patient back to old clear liquid diet per PA Brynda Greathouse.   [name], Therapist, sports .

## 2018-03-11 NOTE — Progress Notes (Signed)
I spoke with Mr. Kinzler in his room.  He is sitting up in his recliner chair today after walking with physical therapy.  He appears more relaxed and comfortable.  He is still having some nausea but over all feels better.  He is still comfortable with the plan of returning to SNF with Hospice Care and eventually going to [facility] when he is ready.  Florentina Jenny, PA-C Palliative Medicine Pager: [phone]   No charge note.

## 2018-03-11 NOTE — Progress Notes (Signed)
Pleurx catheter will be done/is scheduled tomorrow at 9:30. Patient will be NPO after midnight.  Itza Maniaci, RN

## 2018-03-11 NOTE — Progress Notes (Signed)
Progress Note  Patient Name: [name] Date of Encounter: 03/08/2018  Primary Cardiologist: Pixie Casino, MD   Subjective   No acute events overnight. Denies chest pain, shortness of breath. Denies abdominal pain. Discussed the plans for palliative care/hospice with him, noted by primary team.  Inpatient Medications    Scheduled Meds: . feeding supplement (ENSURE ENLIVE)  237 mL Oral TID BM  . metoprolol tartrate  12.5 mg Oral BID  . mirtazapine  7.5 mg Oral QHS  . ondansetron  4 mg Oral TID AC & HS  . pantoprazole  40 mg Oral BID  . polyethylene glycol  17 g Oral Daily  . sertraline  50 mg Oral Daily  . sodium chloride flush  3 mL Intravenous Q12H   Continuous Infusions: . dextrose 5 % and 0.9% NaCl 50 mL/hr at 03/11/18 1046   PRN Meds: acetaminophen, bisacodyl, lidocaine, metoprolol tartrate, morphine CONCENTRATE, ondansetron **OR** ondansetron (ZOFRAN) IV, senna-docusate   Vital Signs    Vitals:   03/10/18 1531 03/10/18 1933 03/11/18 0528 03/11/18 1049  BP: 108/76 129/76 (!) 144/70 139/72  Pulse: 91 89 80 69  Resp: 18 18 18 18   Temp: 98.9 F (37.2 C) 98.3 F (36.8 C) 98.9 F (37.2 C) 98.3 F (36.8 C)  TempSrc: Oral Oral Oral Oral  SpO2: 100% 100% 99% 100%  Weight:   67.6 kg   Height:        Intake/Output Summary (Last 24 hours) at 03/11/2018 1139 Last data filed at 03/11/2018 1020 Gross per 24 hour  Intake 2151.84 ml  Output 580 ml  Net 1571.84 ml   Filed Weights   03/09/18 0422 03/10/18 0621 03/11/18 0528  Weight: 72.6 kg 71.7 kg 67.6 kg    ECG    No new tracings- Personally Reviewed Telemetry: sinus arrhythmia and afib--personally reviewed  Physical Exam   Constitutional: No acute distress ENMT:poordentition, moist mucous membranes Cardiovascular:irregularly irregularrhythm, normal rate, no murmurs. S1 and S2 normal. No jugular venous distention.  Respiratory: clear to auscultation bilaterally except for faint crackles at left  base GI : normal bowel sounds, soft and nontender.Distended but not tense.  MSK: extremities warm, well perfused.Traceedema.  NEURO: grossly nonfocal exam, moves all extremities. PSYCH: alert and oriented x 3, normal mood and affect.   Labs    Chemistry Recent Labs  Lab 03/06/18 0448 03/07/18 0534 03/08/18 0523  NA 142 139 142  K 4.3 4.2 4.1  CL 111 108 112*  CO2 23 25 24   GLUCOSE 80 89 84  BUN 23 22 18   CREATININE 1.72* 1.52* 1.27*  CALCIUM 8.1* 8.2* 8.2*  ALBUMIN  --   --  2.0*  GFRNONAA 34* 40* 49*  GFRAA 40* 46* 57*  ANIONGAP 8 6 6      Hematology Recent Labs  Lab 03/05/18 0419 03/06/18 0448 03/07/18 0534 03/08/18 0520  WBC 3.8* 4.5 6.0  --   RBC 2.99* 3.32* 3.60*  --   HGB 8.2* 9.2* 9.9* 9.0*  HCT 27.8* 31.1* 33.4* 30.1*  MCV 93.0 93.7 92.8  --   MCH 27.4 27.7 27.5  --   MCHC 29.5* 29.6* 29.6*  --   RDW 15.2 15.1 15.3  --   PLT 362 396 454*  --     Cardiac Enzymes No results for input(s): TROPONINI in the last 168 hours.  No results for input(s): TROPIPOC in the last 168 hours.   BNPNo results for input(s): BNP, PROBNP in the last 168 hours.   DDimer  No results for input(s): DDIMER in the last 168 hours.   Radiology    No results found.  Cardiac Studies   Echo 03/04/18 Study Conclusions - Left ventricle: Difficult study Frequent ectopy. LVEF is approximately 35% with severe hypokinesis of the inferior, distal anterior and apical walls; hypokinesis of the inferoseptal wall. The cavity size was normal. Wall thickness was increased in a pattern of mild LVH. Doppler parameters are consistent with abnormal left ventricular relaxation (grade 1 diastolic dysfunction). - Aortic valve: There was trivial regurgitation. Valve area (Vmax): 2.59 cm^2. - Left atrium: The atrium was moderately dilated. - Pulmonary arteries: PA peak pressure: 34 mm Hg (S). - Pericardium, extracardiac: There was a right pleural effusion.  Patient Profile       84 y.o. male with a hx of HTN, PAD, CVA, dementia admitted after found down at his facility with hypotensionwho is being followed for syncope, paroxysmal atrial fibrillation and acute systolic heart failure.   Assessment & Plan    1. PAF:  -intermittent sinus rhythm/sinus arrhythmia and atrial fibrillation. Hstory of CVA in 08/2017 -Metoprolol 12.5mg  twice daily started 03/06/18 with stable BP. He is currently rate controlled. -CHA2DS2VASc =7 places him at high risk for stroke. He was initially planned for GI stent, but now is planned for pleurex catheter and hospice care. Given that anticoagulation with recent anemia/GI bleed may hasten his prognosis, I would recommend no anticoagulation despite his high stroke risk. Discussed this with the patient, he agrees.  2. Acute systolic CHF: -Echocardiogram with LVEF of 35% with diffuse hypokinesis of the inferoseptal wall and G1DD>>last echo in 08/2017 with LV of 60-65% -given plans for hospice care, no further investigation recommended. He is euvolemic. Given his limited PO intake, doubt he will need diuretics, but hospice can manage PRN lasix if he develops volume overload.  3.  Management per primary team/inactive issues New diagnosis of poor differentiated, metastatic gastric adenocarcinoma: pursuing palliative treatment Ascites: malignant on cytology -s/p paracentesis on 03/05/18 with 2.8L removed  GI bleed/Anemia: - heme positive stools, EGD with gastric adenocarcinoma as above.   Syncope: no further events -Likely in the setting of hypotension versus AF vs anemia, pt with no recollection of events  -Last wore monitor earlier this year which was found to be unrevealing -Troponin negative x3 with no ACS symptoms   [facility] will sign off given plans for hospice/palliation of his gastric cancer.   Medication Recommendations:  Metoprolol for rate control Other recommendations (labs, testing, etc):  none Follow up as an  outpatient:  Not required  For questions or updates, please contact [place] Please consult [email or web address] for contact info under    Signed, Buford Dresser, MD  03/08/2018

## 2018-03-11 NOTE — Clinical Social Work Note (Signed)
CSW has notified SNF clinical liaison of plan for patient to return with hospice after pleurex cath is placed and transition to [facility] when eligible.  Dayton Scrape, [place]

## 2018-03-11 NOTE — Plan of Care (Signed)
  Problem: Education: Goal: Knowledge of General Education information will improve Description Including pain rating scale, medication(s)/side effects and non-pharmacologic comfort measures 03/11/2018 2226 by Theador Hawthorne, RN Outcome: Progressing 03/11/2018 2225 by Theador Hawthorne, RN Outcome: Progressing   Problem: Health Behavior/Discharge Planning: Goal: Ability to manage health-related needs will improve 03/11/2018 2226 by Theador Hawthorne, RN Outcome: Progressing   Problem: Clinical Measurements: Goal: Will remain free from infection 03/11/2018 2226 by Theador Hawthorne, RN Outcome: Progressing   Problem: Activity: Goal: Risk for activity intolerance will decrease 03/11/2018 2226 by Theador Hawthorne, RN Outcome: Progressing 03/11/2018 2225 by Theador Hawthorne, RN Outcome: Progressing   Problem: Coping: Goal: Level of anxiety will decrease 03/11/2018 2226 by Theador Hawthorne, RN Outcome: Progressing 03/11/2018 2225 by Theador Hawthorne, RN Outcome: Progressing   Problem: Elimination: Goal: Will not experience complications related to bowel motility Description Pt had no diarrhea today  03/11/2018 2226 by Theador Hawthorne, RN Outcome: Progressing 03/11/2018 2225 by Theador Hawthorne, RN Outcome: Progressing

## 2018-03-11 NOTE — Progress Notes (Signed)
Physical Therapy Treatment Patient Details Name: LILIANA BRENTLINGER MRN: [id number] DOB: 1929-06-16 Today's Date: 03/11/2018    History of Present Illness 84yo male brought to the ED by EMS from his facility, [facility], after being found down at the facility. Appeared to have syncopal episode in bathroom. CT head negative for acute changes. Pt found to have gastric mass obstructing the outlet with hospice. PMH exertional dyspnea, HTN, PAD, CVA, cardiac cath     PT Comments    Pt pleasant on arrival, happy to walk with therapy. Pt with ambulation 348ft with IV pole, intermittent veering and scissoring with gait, suspect this is baseline. Encouraged pt to continue mobility with nursing.     Follow Up Recommendations  SNF(return to SNF)     Equipment Recommendations  None recommended by PT    Recommendations for Other Services       Precautions / Restrictions Precautions Precautions: Fall Restrictions Weight Bearing Restrictions: No    Mobility  Bed Mobility Overal bed mobility: Needs Assistance Bed Mobility: Supine to Sit     Supine to sit: Supervision     General bed mobility comments: increased time, use of rail to rise. leaning right on sitting with increased ti eto gain his balance  Transfers Overall transfer level: Needs assistance   Transfers: Sit to/from Stand Sit to Stand: Min guard         General transfer comment: increased time  Ambulation/Gait Ambulation/Gait assistance: Min guard Gait Distance (Feet): 350 Feet Assistive device: IV Pole Gait Pattern/deviations: Step-through pattern;Decreased stride length;Narrow base of support;Drifts right/left Gait velocity: decreased  Gait velocity interpretation: >2.62 ft/sec, indicative of community ambulatory General Gait Details: min guard throughout gait, used IV pole for balance, pt scissoring throughout gait, does not notice sway or loss of balance. With cues able to correct.     Stairs              Wheelchair Mobility    Modified Rankin (Stroke Patients Only)       Balance Overall balance assessment: Needs assistance Sitting-balance support: Feet supported Sitting balance-Leahy Scale: Fair Sitting balance - Comments: initial right lean with UE support for balance with increased time to achieve sitting unsupported     Standing balance-Leahy Scale: Fair Standing balance comment: reliance on single UE for support, can remove support for short periods, increased sway noted.                             Cognition Arousal/Alertness: Awake/alert Behavior During Therapy: WFL for tasks assessed/performed Overall Cognitive Status: Impaired/Different from baseline Area of Impairment: Memory                               General Comments: slow processing, unable to recall procedure for Pleurex he is to have later today      Exercises      General Comments        Pertinent Vitals/Pain Pain Assessment: No/denies pain    Home Living                      Prior Function            PT Goals (current goals can now be found in the care plan section) Progress towards PT goals: Progressing toward goals    Frequency    Min 2X/week      PT Plan  Frequency needs to be updated;Current plan remains appropriate    Co-evaluation              AM-PAC PT "6 Clicks" Daily Activity  Outcome Measure  Difficulty turning over in bed (including adjusting bedclothes, sheets and blankets)?: None Difficulty moving from lying on back to sitting on the side of the bed? : A Little Difficulty sitting down on and standing up from a chair with arms (e.g., wheelchair, bedside commode, etc,.)?: A Little Help needed moving to and from a bed to chair (including a wheelchair)?: A Little Help needed walking in hospital room?: A Little Help needed climbing 3-5 steps with a railing? : A Little 6 Click Score: 19    End of Session Equipment Utilized  During Treatment: Gait belt Activity Tolerance: Patient tolerated treatment well Patient left: in chair;with chair alarm set;with call bell/phone within reach Nurse Communication: Mobility status PT Visit Diagnosis: Muscle weakness (generalized) (M62.81);History of falling (Z91.81);Other abnormalities of gait and mobility (R26.89)     Time: 1324-4010 PT Time Calculation (min) (ACUTE ONLY): 22 min  Charges:  $Gait Training: 8-22 mins                     Samuella Bruin, [place]  Acute Rehab [phone]    Samuella Bruin 03/11/2018, 11:56 AM

## 2018-03-12 ENCOUNTER — Encounter (HOSPITAL_COMMUNITY)
Admission: EM | Disposition: A | Discharge status: Skilled Nursing Facility | Payer: Self-pay | Source: Home / Self Care | Attending: Internal Medicine

## 2018-03-12 ENCOUNTER — Encounter (HOSPITAL_COMMUNITY): Payer: Self-pay | Admitting: Interventional Radiology

## 2018-03-12 ENCOUNTER — Inpatient Hospital Stay (HOSPITAL_COMMUNITY): Payer: Medicare Other

## 2018-03-12 HISTORY — PX: IR PERC TUN PERIT CATH WO PORT S&I /IMAG: IMG2327

## 2018-03-12 LAB — GLUCOSE, CAPILLARY: Glucose-Capillary: 85 mg/dL (ref 70–99)

## 2018-03-12 SURGERY — EGD (ESOPHAGOGASTRODUODENOSCOPY)
Anesthesia: Monitor Anesthesia Care

## 2018-03-12 MED ORDER — LIDOCAINE HCL 1 % IJ SOLN
INTRAMUSCULAR | Status: AC
  Filled 2018-03-12: qty 20

## 2018-03-12 MED ORDER — FENTANYL CITRATE (PF) 100 MCG/2ML IJ SOLN
INTRAMUSCULAR | Status: AC | PRN
  Administered 2018-03-12: 50 ug via INTRAVENOUS

## 2018-03-12 MED ORDER — LIDOCAINE HCL (PF) 1 % IJ SOLN
INTRAMUSCULAR | Status: AC | PRN
  Administered 2018-03-12: 20 mL

## 2018-03-12 MED ORDER — MIDAZOLAM HCL 2 MG/2ML IJ SOLN
INTRAMUSCULAR | Status: AC | PRN
  Administered 2018-03-12: 1 mg via INTRAVENOUS

## 2018-03-12 MED ORDER — FENTANYL CITRATE (PF) 100 MCG/2ML IJ SOLN
INTRAMUSCULAR | Status: AC
  Filled 2018-03-12: qty 4

## 2018-03-12 MED ORDER — MIDAZOLAM HCL 2 MG/2ML IJ SOLN
INTRAMUSCULAR | Status: AC
  Filled 2018-03-12: qty 4

## 2018-03-12 MED ORDER — CEFAZOLIN SODIUM-DEXTROSE 2-4 GM/100ML-% IV SOLN
INTRAVENOUS | Status: AC
  Filled 2018-03-12: qty 100

## 2018-03-12 NOTE — Progress Notes (Signed)
SW following for transitioning to SNF with Hospice at discharge; patient is for PleurX insertion todayAneta Mins [phone]

## 2018-03-12 NOTE — Progress Notes (Signed)
PROGRESS NOTE  [name] QTM:[id number] DOB: Jul 27, 1929 DOA: 03/03/2018 PCP: Patient, [name]  HPI/Recap of past 1 hours:  84 year old male with history of hypertension, CVA (left inferior MCA infarct in March this year), mild dementia, hyperlipidemia, PAD, BPH resident of [facility] brought to ED by EMS after found on the floor of his facility. Patient says he may have slipped on something but does not recall and thinks he passed out. Patient complained of some abdominal discomfort and was trying to strain in the bathroom when he passed out. Reportedly his systolic blood pressure was in the 80s when he was found. Patient reports being constipated frequently for the past 3 weeks associated with poor p.o. intake, significant weight loss and right-sided abdominal pain with occasional vomiting. Head CT was negative for acute findings. 03/08/2018: Patient underwent EGD on 03/07/2018. EGD revealed "Food in the mid esophagus. A small amount of food (residue) in the stomach. Likely malignant gastric tumor in the prepyloric region of the stomach".  Pathology result is said to reveal adenocarcinoma.+ ascitic fluid with stage IV cancer.  03/09/2018: Discussed with and updated patient's power of attorney and patient's power of attorney his wife. They both understand option of hospice. Discussed with palliative care team as well. oncology team, Dr. Burr Medico. Discussed extensively with the patient, in the presence of floor case manager. Patient does not want PEG tube or artificial feeding, denied stenting of the gastric outlet. Awaiting palliative pleurex placement by IR.  03/12/2018: Patient seen and examined at his bedside.  No acute events overnight.  Patient has no new complaints.  States his breathing and abdominal fullness are improving.  Still plans on returning to ALF with hospice.  Assessment/Plan: Principal Problem:   Syncope and collapse Active Problems:   History of BPH  Right upper quadrant abdominal mass   Essential hypertension   PAD (peripheral artery disease) (HCC)   Dyslipidemia   Protein-calorie malnutrition (HCC)   Vascular dementia without behavioral disturbance (HCC)   Constipation   AKI (acute kidney injury) ([place])   Anemia   Malnutrition of moderate degree   Acute systolic heart failure (HCC)   Paroxysmal atrial fibrillation ([place])   Ascites   Metastatic cancer [facility])   Palliative care encounter   Encounter for hospice care discussion   Gastric adenocarcinoma, metastatic: stage IV. Prognosis is poor. Patient does not want any PEG tube, does not want gastric outlet stenting. Consulted oncology, oncology input is highly appreciated. Discussed, and updated patient's power of attorney and power of attorney his wife. Plan for palliative pleurex done on 03/12/2018 by interventional radiology Plan for discharge to hospice care tomorrow 03/13/2018   Syncope and collapse. Likely multifactorial. Dehydration with overlap of new onset A. fib and cardiomyopathy. Patient is also chronically ill looking and debilitated. Advanced stage iv cancer. fall risk. Bleeding risk, gi bleed. He is not a good candidate for anticoagulation  Blood pressure remained stable.   AKI (acute kidney injury) ([place]). Improved with IV fluids. Continue to avoid nephrotoxins. Continue to hold ACE inhibitor.   Abdominal ascites malignant ascites. Patient is status post paracentesis with 2.8 L yellow fluid removed on 10/1. Negative for SBP. SAAG of 0.7 ruling out the possibility of portal hypertension. Fluid cytology is positive for malignancy.  -IR to place pleurx to avoid recurrence of malignant ascites, done 03/12/18  Acute combined systolic and diastolic CHF/? Nonischemic cardiomyopathy. New finding. 2D echo in March this year showed normal EF. Holding aspirin for concern of  GI bleed. Continue statin. Holding ACE inhibitor due to renal issues. Plan to add Lasix as renal  function permits. Cardiology consult appreciated.No further work-up.  Iron deficiency anemia/? Acute blood loss anemia. As almost 4 g drop in hemoglobin from baseline. Found to be iron deficient. Hemoccult positive. GI consult appreciated. Continue PPI. Hemoglobin has remained stable in past 2 days.EGD is as documented above.   Paroxysmal A. Fib New finding this admission. Patient had an event monitor after CVA earlier this year which did not capture any A. fib. patient is not a good candidate for anticoagulation as above  Protein calorie malnutrition, moderate/failure to thrive: Palliative care input. hospice appropriate.  History of CVA. Left inferior MCA infarct in March 2019. Continue Lipitor, hold aspirin with concern for GI bleed.. Recent follow-up MRI of the brain done 3 weeks back shows expected evolution of the stroke.   Generalized weakness PT evaluation. Reports using both a walker and cane at the facility.  Constipation. Continue senna-Colace and MiraLAX.  History of dementia Mild at baseline. No acute issues.  Code Status: DNR Family Communication:  None at bedside Disposition Plan:  Possibly tomorrow 03/13/2018 SNF with hospice.    Consultants:  Oncology  IR  Cardiology   Palliative care team  Procedures:  EGD  Pleurex pend   Antibiotics: None   Objective: Vitals:   03/12/18 1010 03/12/18 1015 03/12/18 1028 03/12/18 1139  BP: 102/66 117/63 119/69 110/60  Pulse: 94     Resp:      Temp:   (!) 97.5 F (36.4 C)   TempSrc:   Oral   SpO2: 90% 93% 90%   Weight:      Height:        Intake/Output Summary (Last 24 hours) at 03/12/2018 1439 Last data filed at 03/12/2018 0542 Gross per 24 hour  Intake 1309.38 ml  Output 701 ml  Net 608.38 ml   Filed Weights   03/10/18 0621 03/11/18 0528 03/12/18 0621  Weight: 71.7 kg 67.6 kg 70.5 kg    Exam:  . General: 84 y.o. year-old male frail in no acute distress.  Alert and  oriented x2.  . Cardiovascular: Regular rate and rhythm with no rubs or gallops.  No thyromegaly or JVD noted.   Marland Kitchen Respiratory: Clear to auscultation with no wheezes or rales. Good inspiratory effort. . Abdomen: Soft nontender mildly distended with mildly protruding mass on the right upper quadrant.  Normal bowel sounds x4 quadrants.   . Musculoskeletal: No lower extremity edema. 2/4 pulses in all 4 extremities. Marland Kitchen Psychiatry: Mood is appropriate for condition and setting   Data Reviewed: CBC: Recent Labs  Lab 03/06/18 0448 03/07/18 0534 03/08/18 0520  WBC 4.5 6.0  --   HGB 9.2* 9.9* 9.0*  HCT 31.1* 33.4* 30.1*  MCV 93.7 92.8  --   PLT 396 454*  --    Basic Metabolic Panel: Recent Labs  Lab 03/06/18 0448 03/07/18 0534 03/08/18 0523  NA 142 139 142  K 4.3 4.2 4.1  CL 111 108 112*  CO2 23 25 24   GLUCOSE 80 89 84  BUN 23 22 18   CREATININE 1.72* 1.52* 1.27*  CALCIUM 8.1* 8.2* 8.2*  MG  --   --  1.7  PHOS  --   --  3.6   GFR: Estimated Creatinine Clearance: 40.4 mL/min (A) (by C-G formula based on SCr of 1.27 mg/dL (H)). Liver Function Tests: Recent Labs  Lab 03/08/18 0523  ALBUMIN 2.0*   No results for  input(s): LIPASE, AMYLASE in the last 168 hours. No results for input(s): AMMONIA in the last 168 hours. Coagulation Profile: Recent Labs  Lab 03/11/18 0728  INR 1.16   Cardiac Enzymes: No results for input(s): CKTOTAL, CKMB, CKMBINDEX, TROPONINI in the last 168 hours. BNP (last 3 results) No results for input(s): PROBNP in the last 8760 hours. HbA1C: No results for input(s): HGBA1C in the last 72 hours. CBG: Recent Labs  Lab 03/08/18 0949 03/09/18 0632 03/10/18 0638 03/11/18 0807 03/12/18 0752  GLUCAP 85 91 105* 89 85   Lipid Profile: No results for input(s): CHOL, HDL, LDLCALC, TRIG, CHOLHDL, LDLDIRECT in the last 72 hours. Thyroid Function Tests: No results for input(s): TSH, T4TOTAL, FREET4, T3FREE, THYROIDAB in the last 72 hours. Anemia  Panel: No results for input(s): VITAMINB12, FOLATE, FERRITIN, TIBC, IRON, RETICCTPCT in the last 72 hours. Urine analysis:    Component Value Date/Time   COLORURINE YELLOW 03/03/2018 1651   APPEARANCEUR HAZY (A) 03/03/2018 1651   LABSPEC 1.015 03/03/2018 1651   PHURINE 5.0 03/03/2018 1651   GLUCOSEU NEGATIVE 03/03/2018 1651   HGBUR NEGATIVE 03/03/2018 1651   BILIRUBINUR NEGATIVE 03/03/2018 1651   BILIRUBINUR small 10/17/2012 1356   KETONESUR 5 (A) 03/03/2018 1651   PROTEINUR 30 (A) 03/03/2018 1651   UROBILINOGEN 4.0 10/17/2012 1356   UROBILINOGEN 0.2 07/04/2010 0805   NITRITE NEGATIVE 03/03/2018 1651   LEUKOCYTESUR NEGATIVE 03/03/2018 1651   Sepsis Labs: @LABRCNTIP (procalcitonin:4,lacticidven:4)  ) Recent Results (from the past 240 hour(s))  MRSA PCR Screening     Status: None   Collection Time: 03/03/18  3:20 PM  Result Value Ref Range Status   MRSA by PCR NEGATIVE NEGATIVE Final    Comment:        The GeneXpert MRSA Assay (FDA approved for NASAL specimens only), is one component of a comprehensive MRSA colonization surveillance program. It is not intended to diagnose MRSA infection nor to guide or monitor treatment for MRSA infections. Performed at [facility], [place] [address] [place], [place] [address]   Culture, body fluid-bottle     Status: None   Collection Time: 03/05/18  2:25 PM  Result Value Ref Range Status   Specimen Description PERITONEAL  Final   Special Requests NONE  Final   Culture   Final    NO GROWTH 5 DAYS Performed at [facility], [address], [place], [place] [address]    Report Status 03/10/2018 FINAL  Final  Gram stain     Status: None   Collection Time: 03/05/18  2:25 PM  Result Value Ref Range Status   Specimen Description PERITONEAL  Final   Special Requests   Final    NONE Performed at [facility], [place] [address], [place], [place] [address]    Gram Stain   Final    FEW WBC PRESENT,BOTH PMN AND  MONONUCLEAR NO ORGANISMS SEEN    Report Status 03/05/2018 FINAL  Final      Studies: Ir Perc Athena Masse Perit Cath Wo Port  Result Date: 03/12/2018 INDICATION: 84 year old male with recurrent ascites secondary to gastric carcinoma EXAM: Image guided placement of tunneled peritoneal catheter MEDICATIONS: The patient is currently admitted to the hospital and receiving intravenous antibiotics. The antibiotics were administered within an appropriate time frame prior to the initiation of the procedure. ANESTHESIA/SEDATION: Fentanyl 1.0 mcg IV; Versed 50 mg IV Moderate Sedation Time:  15 minutes The patient was continuously monitored during the procedure by the interventional radiology nurse under  my direct supervision. COMPLICATIONS: None PROCEDURE: The procedure, risks, benefits, and alternatives were explained to the patient and the patient's family. Specific risks that were addressed included bleeding, infection, need for further procedure, chance of delayed hemorrhage, cardiopulmonary collapse, death. Questions regarding the procedure were encouraged and answered. The patient understands and consents to the procedure. The right abdominal wall was prepped with Betadine in a sterile fashion, and a sterile drape was applied covering the operative field. A sterile gown and sterile gloves were used for the procedure. Local anesthesia was provided with 1% Lidocaine. Ultrasound image documentation was performed. After creating a small skin incision, a 19 gauge needle was advanced into the peritoneal cavity under ultrasound guidance. A guide wire was then advanced under fluoroscopy into the space. Access was dilated serially and a 16-French peel-away sheath placed. The skin and subcutaneous tissues were generously infiltrated with 1% lidocaine from the puncture site along the abdomen anteriorly. A small stab incision was made with 11 blade scalpel at the insertion site of the catheter, and the catheter was back tunneled  to the site at the puncture. A tunneled CareFusion Pleurex catheter was placed. This was tunneled from the incision 5 cm anterior to the access to the access site. The catheter was advanced through the peel-away sheath. The sheath was then removed. Final catheter positioning was confirmed with a fluoroscopic spot image. The access incision was closed with Derma bond. Dermabond was applied to the catheterization incision. Large volume paracentesis was performed through the new catheter utilizing vacuum containers. The patient tolerated the procedure well and remained hemodynamically stable throughout. No complications were encountered and no significant blood loss was encountered. IMPRESSION: Status post peritoneal tunneled catheter placement. Signed, Dulcy Fanny. Dellia Nims, RPVI [facility] Electronically Signed   By: Corrie Mckusick D.O.   On: 03/12/2018 10:38    Scheduled Meds: . feeding supplement (ENSURE ENLIVE)  237 mL Oral TID BM  . fentaNYL      . lidocaine      . metoprolol tartrate  12.5 mg Oral BID  . midazolam      . mirtazapine  7.5 mg Oral QHS  . ondansetron  4 mg Oral TID AC & HS  . pantoprazole  40 mg Oral BID  . polyethylene glycol  17 g Oral Daily  . sertraline  50 mg Oral Daily  . sodium chloride flush  3 mL Intravenous Q12H    Continuous Infusions: . ceFAZolin       LOS: 9 days     Kayleen Memos, MD [facility] Pager [phone]  If 7PM-7AM, please contact night-coverage [email or web address] Password [facility] 03/12/2018, 2:39 PM

## 2018-03-12 NOTE — Procedures (Signed)
Interventional Radiology Procedure Note  Procedure:  Image guided tunneled abd pleurx catheter.   Complications: None Recommendations:  - Ok to use - Do not submerge  - Routine wound care   Signed,  Dulcy Fanny. Earleen Newport, DO

## 2018-03-12 NOTE — Progress Notes (Signed)
Nutrition Follow-up  DOCUMENTATION CODES:   Non-severe (moderate) malnutrition in context of chronic illness  INTERVENTION:   -Continue Ensure Enlive po TID, each supplement provides 350 kcal and 20 grams of protein -Diet advanced to regular, per verbal permission from Dr. Aileen Fass, hospitalist  NUTRITION DIAGNOSIS:   Moderate Malnutrition related to chronic illness(CVA, dementia) as evidenced by energy intake < or equal to 75% for > or equal to 1 month, mild fat depletion, moderate fat depletion, mild muscle depletion, moderate muscle depletion.  Ongoing  GOAL:   Patient will meet greater than or equal to 90% of their needs  Progressing  MONITOR:   PO intake, Supplement acceptance, Labs, Weight trends, Skin, I & O's  REASON FOR ASSESSMENT:   Consult Assessment of nutrition requirement/status  ASSESSMENT:   [name] is a 84 y.o. male with medical history significant for HTN, PAD, CVA, dementia, HLD, BPH, who was brought to the ED via EMS this morning after being found down at his facility.  Reviewed I/O's: +1.5 L x 24 hours and +12.6 L since admission  10/1- s/p paracentesis (2.8 L removed) 10/3- s/p upper endo with biopsy (revealed food found in esophagus and stomach; mass found in pre-pyloric region of stomach, which was biopsied) 10/8- pleurex catheter placed  Per oncology notes, pt with metastatic gastric cancer to peritoneum with malignant ascites and GOO.   Case discussed with RN prior to visit, who reports that pt is requesting food and has been NPO or on a clear liquid diet over the past several days. RN provided pt with canned peaches, applesauce, and Ensure. She has attempted to contact MD for diet advancement.   Spoke with pt and friend at bedside. Pt is eager to eat- consumed 100% of Ensure and fruit cup. Reviewed verbal permission to advance pt to a regular diet from Dr. Aileen Fass (hospitalist) via secure chat.   Plan to d/c pt to SNF once stable (likely  in a few days per RN) and will eventually transition to hospice house.   Labs reviewed: CBGS: 85-105.   Diet Order:   Diet Order            Diet regular Room service appropriate? Yes; Fluid consistency: Thin  Diet effective now              EDUCATION NEEDS:   Education needs have been addressed  Skin:  Skin Assessment: Skin Integrity Issues: Skin Integrity Issues:: Other (Comment) Other: rt posterior head laceration  Last BM:  03/11/18  Height:   Ht Readings from Last 1 Encounters:  03/05/18 5\' 8"  (1.727 m)    Weight:   Wt Readings from Last 1 Encounters:  03/12/18 70.5 kg    Ideal Body Weight:  70 kg  BMI:  Body mass index is 23.63 kg/m.  Estimated Nutritional Needs:   Kcal:  1800-2000  Protein:  90-105 grams  Fluid:  1.8-2.0 L    Zamora Colton A. Jimmye Norman, RD, LDN, CDE Pager: [phone] After hours Pager: [phone]

## 2018-03-12 NOTE — H&P (Signed)
Chief Complaint: Patient was seen in consultation today for tunneled peritoneal catheter placement.  Referring Physician(s): Hardie Pulley  Supervising Physician: Corrie Mckusick  Patient Status: [facility] - In-pt  History of Present Illness: [name] is a 84 y.o. 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 Patient had paracentesis in IR on 03/05/18 yielding 2.8 liters of fluid which cytology showed was malignant in nature. He also underwent upper endoscopy on 03/07/18 with GI which showed a large fungating, infiltrative, polypoid and ulcerated, circumferential mass in the prepyloric region of the stomach which infiltrated into the duodenum with near complete gastric outlet obstruction. Biopsies were taken of this mass during EGD and pathology of these biopsies were consistent with poorly differentiated adenocarcinoma.  Request has been placed to IR today for tunneled peritoneal catheter placement for malignant ascites and palliative care. Plan per chart is for patient to be d/ced to SNF with [facility].  Patient reports he does not feel distended like he was prior to paracentesis - he states his appetite is ok, although he has not eaten since 9 am yesterday. He denies any nausea or vomiting.  Past Medical History:  Diagnosis Date  . BPH (benign prostatic hyperplasia)   . Cataract   . Essential hypertension 05/08/2013  . Exertional dyspnea    with exertion  . Hypertension   . Mitral valvular regurgitation June 2014   Mild to moderate MR on echocardiogram  . PAD (peripheral artery disease) ([place])  December 2014   Lower extremity Dopplers/ABIs: RABI 0.29, LABI 0.66; bilateral external iliacs with significant diameter reduction; R. SFA occlusive disease throughout into popliteal artery with 0  vessel runoff. Anterior tibial reconstitutes distally. 70-99% reduction in all SFA. One-vessel runoff (anterior tibial)  . Stroke ([place])   . Syncope and collapse 03/03/2018    Past Surgical History:  Procedure Laterality Date  . ABDOMINAL ANGIOGRAM  07/07/2013   Procedure: ABDOMINAL ANGIOGRAM;  Surgeon: Lorretta Harp, MD;  Location: [facility] CATH LAB;  Service: Cardiovascular;;  . BIOPSY  03/07/2018   Procedure: BIOPSY;  Surgeon: Laurence Spates, MD;  Location: [facility] ENDOSCOPY;  Service: Endoscopy;;  . CHOLECYSTECTOMY  07/04/2010  . ERCP N/A 11/27/2012   Procedure: ENDOSCOPIC RETROGRADE CHOLANGIOPANCREATOGRAPHY (ERCP);  Surgeon: Inda Castle, MD;  Location: [facility] ENDOSCOPY;  Service: Endoscopy;  Laterality: N/A;  . ESOPHAGOGASTRODUODENOSCOPY (EGD) WITH PROPOFOL N/A 03/07/2018   Procedure: ESOPHAGOGASTRODUODENOSCOPY (EGD) WITH PROPOFOL;  Surgeon: Laurence Spates, MD;  Location: [facility];  Service: Endoscopy;  Laterality: N/A;  . EYE SURGERY Right   . IR PARACENTESIS  03/05/2018  . LEFT HEART CATHETERIZATION WITH CORONARY ANGIOGRAM N/A 07/07/2013   Procedure: LEFT HEART CATHETERIZATION WITH CORONARY ANGIOGRAM;  Surgeon: Lorretta Harp, MD;  Location: [facility] C[facility] LAB;  Service: Cardiovascular;  Laterality: N/A;  . LOWER EXTREMITY ANGIOGRAM N/A 07/07/2013   Procedure: LOWER EXTREMITY ANGIOGRAM;  Surgeon: Lorretta Harp, MD;  Location: [facility] CATH LAB;  Service: Cardiovascular;  Laterality: N/A;  . NM MYOVIEW LTD  December 2014   Apical thinning but no ischemia. EF 55%  . PROSTATE SURGERY     Dr. Lowella Bandy  . RECTAL EXAM UNDER ANESTHESIA  03/07/2018   Procedure: RECTAL EXAM UNDER ANESTHESIA;  Surgeon: Laurence Spates, MD;  Location: [facility] ENDOSCOPY;  Service: Endoscopy;;  . TRANSTHORACIC ECHOCARDIOGRAM  June 2014   Basal  septal thickening with moderate LVH. EF 65%. Normal wall motion. Diastolic dysfunction/NOS. Aortic sclerosis without stenosis. Mild to moderate MR    Allergies: Patient has no known  allergies.  Medications: Prior to Admission medications   Medication Sig Start Date End Date Taking? Authorizing Provider  acetaminophen (TYLENOL) 325 MG tablet Take 2 tablets (650 mg total) by mouth every 6 (six) hours as needed for mild pain (or Fever >/= 101). 08/14/17  Yes Oretha Milch D, MD  aspirin EC 325 MG tablet Take 1 tablet (325 mg total) by mouth daily. 10/30/17  Yes Rosalin Hawking, MD  atorvastatin (LIPITOR) 20 MG tablet Take 20 mg by mouth at bedtime. 09/13/17  Yes [provider]  Cholecalciferol 1000 units tablet Take 1,000 Units by mouth daily. 12/11/17  Yes [provider]  lisinopril (PRINIVIL,ZESTRIL) 40 MG tablet Take 1 tablet (40 mg total) by mouth daily. 08/14/17  Yes Oretha Milch D, MD  mirtazapine (REMERON) 15 MG tablet Take 7.5 mg by mouth at bedtime.   Yes [provider]  polyethylene glycol (MIRALAX / GLYCOLAX) packet Take 17 g by mouth daily.   Yes [provider]  sertraline (ZOLOFT) 50 MG tablet Take 1 tablet (50 mg total) by mouth daily. 10/30/17  Yes Rosalin Hawking, MD     Family History  Problem Relation Age of Onset  . Diabetes Sister   . Cancer Sister   . Cancer Mother   . Cancer Father   . Cancer Sister   . Heart disease Brother     Social History   Socioeconomic History  . Marital status: Single    Spouse name: Not on file  . Number of children: Not on file  . Years of education: Not on file  . Highest education level: Not on file  Occupational History  . Occupation: Retired    Fish farm manager: RETIRED  Social Needs  . Financial resource strain: Not hard at all  . Food insecurity:    Worry: Never true    Inability: Never true  . Transportation needs:    Medical: No    Non-medical: No  Tobacco Use  . Smoking status: Former Smoker    Types: Cigarettes    Last attempt to quit: 11/25/1980    Years since quitting: 37.3  . Smokeless tobacco: Never Used  Substance and Sexual Activity  . Alcohol use: Not Currently     Alcohol/week: 4.0 standard drinks    Types: 4 Standard drinks or equivalent per week    Comment: beers  . Drug use: No  . Sexual activity: Not Currently  Lifestyle  . Physical activity:    Days per week: 0 days    Minutes per session: 0 min  . Stress: Not at all  Relationships  . Social connections:    Talks on phone: Once a week    Gets together: Once a week    Attends religious service: Never    Active member of club or organization: No    Attends meetings of clubs or organizations: Never    Relationship status: Never married  Other Topics Concern  . Not on file  Social History Narrative   He is originally from [place]. He has a history of long-standing tobacco use but quit several years ago. He also has a history of former alcohol abuse as well.   He is retired and currently single.     Review of Systems: A 12 point ROS discussed and pertinent positives are indicated in the HPI  above.  All other systems are negative.  Review of Systems  Constitutional: Positive for fatigue. Negative for activity change, appetite change, chills and fever.  Respiratory: Negative for cough and shortness of breath.   Cardiovascular: Negative for chest pain.  Gastrointestinal: Negative for abdominal distention, abdominal pain, nausea and vomiting.  Neurological: Negative for syncope and light-headedness.  Psychiatric/Behavioral: Negative for confusion.    Vital Signs: BP 123/85 (BP Location: Left Arm)   Pulse 69   Temp 99 F (37.2 C) (Oral)   Resp 19   Ht 5\' 8"  (1.727 m)   Wt 155 lb 6.4 oz (70.5 kg) Comment: Scale A  SpO2 97%   BMI 23.63 kg/m   Physical Exam  Constitutional: No distress.  HENT:  Head: Normocephalic.  Cardiovascular: Normal rate, regular rhythm and normal heart sounds.  Pulmonary/Chest: Effort normal and breath sounds normal.  Abdominal: Soft. He exhibits no distension. There is no tenderness.  Neurological: He is alert.  Skin: Skin is warm and dry. He is not  diaphoretic.  Psychiatric: He has a normal mood and affect. His behavior is normal. Judgment and thought content normal.  Nursing note and vitals reviewed.    MD Evaluation Airway: WNL Heart: WNL Abdomen: WNL Chest/ Lungs: WNL ASA  Classification: 3 Mallampati/Airway Score: One   Imaging: Ct Abdomen Wo Contrast  Result Date: 03/03/2018 CLINICAL DATA:  Abdominal mass. Appears to be an abdominal wall hernia versus rectus sheath hematoma or mass. EXAM: CT ABDOMEN WITHOUT CONTRAST TECHNIQUE: Multidetector CT imaging of the abdomen was performed following the standard protocol without IV contrast. COMPARISON:  February 25, 2016 FINDINGS: Lower chest: Mild atelectasis in the bases.  Small effusions. Hepatobiliary: Pneumobilia is consistent with previous sphincterotomy, unchanged, in the left hepatic lobe. The patient's known left hepatic mass is not well assessed without contrast on today's study. This has the appearance of a hemangioma on previous imaging. The liver is otherwise unremarkable. The contour of the liver is smooth and not nodular. The patient is status post cholecystectomy. Pancreas: Unremarkable. No pancreatic ductal dilatation or surrounding inflammatory changes. Spleen: Normal in size without focal abnormality. Adrenals/Urinary Tract: Adrenal glands are unremarkable. Kidneys are normal, without renal calculi, focal lesion, or hydronephrosis. Stomach/Bowel: The stomach and small bowel are normal without identified obstruction. The colon is decompressed but does contain contrast. No obvious colonic abnormality. The cecum and appendix, if the patient has 1, are below today's film. Vascular/Lymphatic: Atherosclerotic changes in the nonaneurysmal aorta. Probable shotty and mildly enlarged retroperitoneal nodes, poorly evaluated due to lack of contrast and lack of intra-abdominal fat. An apparent left periaortic node on image 35 measures 15 mm in short axis. Other: Diffuse ascites of  uncertain etiology. Lipoma in the right abdominal wall as seen on series 3, image 34. No other abdominal wall mass. Tiny periumbilical hernia. No other hernia is noted. No rectus muscle hematoma identified. Musculoskeletal: No acute or significant osseous findings. IMPRESSION: 1. Diffuse ascites of uncertain etiology. 2. Suggested retroperitoneal adenopathy, poorly evaluated due to lack of contrast and lack of intra-abdominal fat. The nodes are nonspecific but could be reactive or neoplastic. Recommend follow-up after resolution of the patient's ascites for better evaluation. 3. Tiny pleural effusions and mild bibasilar atelectasis. 4. Atherosclerotic changes in the nonaneurysmal aorta. 5. Right abdominal wall lipoma. 6. Tiny periumbilical fat containing hernia. Electronically Signed   By: Dorise Bullion III M.D   On: 03/03/2018 18:17   Dg Chest 2 View  Result Date: 03/03/2018 CLINICAL DATA:  Unwitnessed  fall.  Hypertension. EXAM: CHEST - 2 VIEW COMPARISON:  August 09, 2017 FINDINGS: There is atelectatic change in the right mid lower lung zones. There is no edema or consolidation. Heart is borderline enlarged with pulmonary vascularity normal. Aorta is prominent and rather tortuous, stable. There are metallic fragments in the left hemithorax. No pneumothorax. No bone lesions. IMPRESSION: No edema or consolidation. Areas of mild atelectatic change on the right. Stable cardiac silhouette. Aortic prominence and tortuosity likely reflect chronic hypertensive change. Electronically Signed   By: Lowella Grip III M.D.   On: 03/03/2018 11:44   Ct Head Wo Contrast  Result Date: 03/03/2018 CLINICAL DATA:  Unwitnessed fall EXAM: CT HEAD WITHOUT CONTRAST TECHNIQUE: Contiguous axial images were obtained from the base of the skull through the vertex without intravenous contrast. COMPARISON:  Head CT August 09, 2017 and brain MRI February 08, 2018 FINDINGS: Brain: There is moderate diffuse atrophy. There is no  intracranial mass, hemorrhage, extra-axial fluid collection, or midline shift. There is evidence of a prior infarct involving much of the left occipital lobe, stable. There is evidence of a prior infarct in the mid right occipital lobe, stable. There is evidence of a prior infarct at the gray-white junction of the anterior right parietal lobe, stable. Elsewhere there is patchy small vessel disease in the centra semiovale bilaterally. There is no acute appearing infarct. Basal ganglia calcification bilaterally is a physiologic finding. A small calcification in the mid right cerebellum may represent a small granuloma. This finding was present on prior study. Vascular: There is no hyperdense vessel. There is calcification in the left vertebral artery as well as in both carotid siphon regions. Skull: Bony calvarium appears intact. There is a right parietal scalp hematoma. Sinuses/Orbits: There is opacification in a posterior ethmoid air cell. There is mucosal thickening in several ethmoid air cells bilaterally. There is inward bowing of each medial orbital wall, a finding that may be congenital or may be due to previous trauma. This finding is stable. No intraorbital lesions are evident. Other: Mastoid air cells are clear. IMPRESSION: 1. Right parietal scalp hematoma with underlying bony calvarium intact. 2. Atrophy with supratentorial small vessel disease. Prior infarcts as noted, largest in the occipital lobes. 3.  No mass or hemorrhage.  No extra-axial fluid collection. 4.  Foci of arterial vascular calcification. 5.  Ethmoid sinus disease noted. Electronically Signed   By: Lowella Grip III M.D.   On: 03/03/2018 11:21   US Renal  Result Date: 03/05/2018 CLINICAL DATA:  Acute tubular necrosis. EXAM: RENAL / URINARY TRACT ULTRASOUND COMPLETE COMPARISON:  CT 2 days ago. FINDINGS: Right Kidney: Length: 11.3 cm. Echogenicity within normal limits. No mass or hydronephrosis visualized. Left Kidney: Length: 12.0 cm.  Echogenicity within normal limits. No mass or hydronephrosis visualized. Bladder: Urine is present within the bladder.  No ureteral jet is visualized. There is ascites diffusely distributed. IMPRESSION: Kidneys appear normal by sonography. Normal size and echogenicity. No obstruction. Urine in the bladder. No ureteral jet is identified however. Diffuse ascites. Electronically Signed   By: Nelson Chimes M.D.   On: 03/05/2018 09:27   Ir Paracentesis  Result Date: 03/05/2018 INDICATION: Patient with history of congestive heart failure, stroke, malnutrition, acute kidney injury, suggested retroperitoneal adenopathy on CT, ascites. Request made for diagnostic and therapeutic paracentesis. EXAM: ULTRASOUND GUIDED DIAGNOSTIC AND THERAPEUTIC PARACENTESIS MEDICATIONS: None COMPLICATIONS: None immediate. PROCEDURE: Informed written consent was obtained from the patient after a discussion of the risks, benefits and alternatives to treatment. A  timeout was performed prior to the initiation of the procedure. Initial ultrasound scanning demonstrates a moderate amount of ascites within the left mid to lower abdominal quadrant. The left mid to lower abdomen was prepped and draped in the usual sterile fashion. 2% lidocaine was used for local anesthesia. Following this, a 19 gauge, 7-cm, Yueh catheter was introduced. An ultrasound image was saved for documentation purposes. The paracentesis was performed. The catheter was removed and a dressing was applied. The patient tolerated the procedure well without immediate post procedural complication. FINDINGS: A total of approximately 2.8 liters of yellow fluid was removed. Samples were sent to the laboratory as requested by the clinical team. IMPRESSION: Successful ultrasound-guided diagnostic and therapeutic paracentesis yielding 2.8 liters of peritoneal fluid. Read by: Rowe Willet, PA-C Electronically Signed   By: Sandi Mariscal M.D.   On: 03/05/2018 14:30    Labs:  CBC: Recent  Labs    03/04/18 0609 03/05/18 0419 03/06/18 0448 03/07/18 0534 03/08/18 0520  WBC 4.9 3.8* 4.5 6.0  --   HGB 9.4* 8.2* 9.2* 9.9* 9.0*  HCT 32.0* 27.8* 31.1* 33.4* 30.1*  PLT 409* 362 396 454*  --     COAGS: Recent Labs    08/09/17 2032 03/11/18 0728  INR 1.07 1.16  APTT 31  --     BMP: Recent Labs    03/05/18 0419 03/06/18 0448 03/07/18 0534 03/08/18 0523  NA 138 142 139 142  K 3.7 4.3 4.2 4.1  CL 111 111 108 112*  CO2 24 23 25 24   GLUCOSE 105* 80 89 84  BUN 26* 23 22 18   CALCIUM 7.4* 8.1* 8.2* 8.2*  CREATININE 2.14* 1.72* 1.52* 1.27*  GFRNONAA 26* 34* 40* 49*  GFRAA 30* 40* 46* 57*    LIVER FUNCTION TESTS: Recent Labs    08/09/17 2032 08/10/17 0548 03/03/18 0919 03/08/18 0523  BILITOT 1.4* 0.6 1.0  --   AST 21 12* 19  --   ALT 9* 8* 9  --   ALKPHOS 95 82 98  --   PROT 5.9* 5.3* 4.9*  --   ALBUMIN 3.7 3.1* 2.3* 2.0*    TUMOR MARKERS: No results for input(s): AFPTM, CEA, CA199, CHROMGRNA in the last 8760 hours.  Assessment and Plan:  Recently diagnosed metastatic gastric adenocarcinoma with malignant ascites - patient has elected to proceed with palliative/hospice care at this time. Request to IR for tunneled peritoneal catheter placement - patient reviewed with Dr. Earleen Newport today who is agreeable to place peritoneal cathter if patient has recurrent ascites s/p paracentesis on 03/05/18 yielding 2.8 L. Discussed with patient that IR may not be able to safely place catheter today if ascites has not re-accumulated to which he states understanding and would like to proceed.  Patient has been NPO since last night, he does not take blood thinning medications, he is afebrile, WBC 6.0, INR 1.16.  Risks and benefits discussed with the patient including bleeding, infection, damage to adjacent structures, malfunction of the catheter with need for additional procedures.  All of the patient's questions were answered, patient is agreeable to proceed.  Consent  signed and in chart.  Thank you for this interesting consult.  I greatly enjoyed meeting Najeh B Moreland and look forward to participating in their care.  A copy of this report was sent to the requesting provider on this date.  Electronically Signed: Joaquim Nam, PA-C 03/12/2018, 8:36 AM   I spent a total of 40 Minutes in face to face in  clinical consultation, greater than 50% of which was counseling/coordinating care for tunneled peritoneal catheter placement.

## 2018-03-13 LAB — GLUCOSE, CAPILLARY: Glucose-Capillary: 107 mg/dL — ABNORMAL HIGH (ref 70–99)

## 2018-03-13 MED ORDER — BISACODYL 5 MG PO TBEC: 10.0000 mg | DELAYED_RELEASE_TABLET | Freq: Every day | ORAL | 0 refills | Status: AC | PRN

## 2018-03-13 MED ORDER — MORPHINE SULFATE (CONCENTRATE) 10 MG/0.5ML PO SOLN: 5.0000 mg | ORAL | 0 refills | Status: AC | PRN

## 2018-03-13 MED ORDER — METOPROLOL TARTRATE 25 MG PO TABS: 12.5000 mg | ORAL_TABLET | Freq: Two times a day (BID) | ORAL | Status: AC

## 2018-03-13 MED ORDER — ONDANSETRON HCL 4 MG/5ML PO SOLN: 4.0000 mg | Freq: Three times a day (TID) | ORAL | 0 refills | Status: AC

## 2018-03-13 MED ORDER — PANTOPRAZOLE SODIUM 40 MG PO TBEC: 40.0000 mg | DELAYED_RELEASE_TABLET | Freq: Two times a day (BID) | ORAL | Status: DC

## 2018-03-13 NOTE — Clinical Social Work Note (Signed)
CSW facilitated patient discharge including contacting patient family (Mr. Oletta Lamas) and facility to confirm patient discharge plans. Clinical information faxed to facility and family agreeable with plan. CSW arranged ambulance transport via [facility] to [facility]. RN to call report prior to discharge [phone] Room 227A).  CSW will sign off for now as social work intervention is no longer needed. Please consult [place] again if new needs arise.  Dayton Scrape, [place]

## 2018-03-13 NOTE — Discharge Summary (Signed)
Physician Discharge Summary   Patient ID: [name] MRN: [id number] DOB/AGE: 09-12-1929 84 y.o.  Admit date: 03/03/2018 Discharge date: 03/13/2018  Primary Care Physician:  Patient, [name]   Recommendations for Outpatient Follow-up:  1. Follow-up as needed with cardiology, PCP at the facility  Home Health: None  Equipment/Devices: None  Discharge Condition: Poor prognosis, guarded, CODE STATUS: DNR Diet recommendation: Regular diet, hospice   Discharge Diagnoses:    Gastric adenocarcinoma, metastatic, stage IV Acute kidney injury Abdominal ascites status post Pleurx catheter Acute combined systolic and diastolic CHF Paroxysmal atrial fibrillation History of CVA . Essential hypertension . History of BPH . PAD (peripheral artery disease) ([place]) . Syncope and collapse . Dyslipidemia . Moderate protein-calorie malnutrition ([place]) . Vascular dementia without behavioral disturbance ([facility]) Failure to thrive  Consults:   Oncology IR Cardiology Palliative care team    Allergies:  No Known Allergies   DISCHARGE MEDICATIONS: Allergies as of 03/13/2018   No Known Allergies     Medication List    STOP taking these medications   aspirin EC 325 MG tablet   atorvastatin 20 MG tablet Commonly known as:  LIPITOR   lisinopril 40 MG tablet Commonly known as:  PRINIVIL,ZESTRIL     TAKE these medications   acetaminophen 325 MG tablet Commonly known as:  TYLENOL Take 2 tablets (650 mg total) by mouth every 6 (six) hours as needed for mild pain (or Fever >/= 101).   bisacodyl 5 MG EC tablet Commonly known as:  DULCOLAX Take 2 tablets (10 mg total) by mouth daily as needed for moderate constipation.   Cholecalciferol 1000 units tablet Take 1,000 Units by mouth daily.   metoprolol tartrate 25 MG tablet Commonly known as:  LOPRESSOR Take 0.5 tablets (12.5 mg total) by mouth 2 (two) times daily.   mirtazapine 15 MG tablet Commonly known as:  REMERON Take  7.5 mg by mouth at bedtime.   morphine CONCENTRATE 10 MG/0.5ML Soln concentrated solution Take 0.25 mLs (5 mg total) by mouth every 2 (two) hours as needed for severe pain.   ondansetron 4 MG/5ML solution Commonly known as:  ZOFRAN Take 5 mLs (4 mg total) by mouth 4 (four) times daily -  before meals and at bedtime.   pantoprazole 40 MG tablet Commonly known as:  PROTONIX Take 1 tablet (40 mg total) by mouth 2 (two) times daily.   polyethylene glycol packet Commonly known as:  MIRALAX / GLYCOLAX Take 17 g by mouth daily.   sertraline 50 MG tablet Commonly known as:  ZOLOFT Take 1 tablet (50 mg total) by mouth daily.        Brief H and P: For complete details please refer to admission H and P, but in brief84 year old male with history of hypertension, CVA (left inferior MCA infarct in March this year), mild dementia, hyperlipidemia, PAD, BPH resident of [facility] brought to ED by EMS after found on the floor of his facility. Patient says he may have slipped on something but does not recall and thinks he passed out. Patient complained of some abdominal discomfort and was trying to strain in the bathroom when he passed out. Reportedly his systolic blood pressure was in the 80s when he was found. Patient reported being constipated frequently for the past 3 weeks associated with poor p.o. intake, significant weight loss and right-sided abdominal pain with occasional vomiting. Head CT was negative for acute findings.  She was admitted for further work-up  Hospital Course:  Gastric  adenocarcinoma, metastatic, stage IV -Patient underwent EGD on 10/3, which revealed food in the mid esophagus, in the stomach, likely malignant gastric tumor in the prepyloric region of the stomach. -Pathology results revealed adenocarcinoma. -Oncology and palliative care discussed goals of care extensively with the patient and family.  Patient did not want PEG tube or artificial feeding, any further  invasive procedures including gastric outlet stenting. -Given ascites, IR was consulted for palliative Pleurx which was done on 03/12/2018 -Overall poor prognosis, continue comfort care, comfort food   Syncope -Multifactorial likely due to dehydration, new onset atrial fibrillation, cardiomyopathy and malignancy, chronic failure to thrive -Given advanced stage IV cancer, fall risk, high bleeding risk, he is not a candidate for anticoagulation   Acute kidney injury -Likely due to dehydration/medications.  Continue to hold ACE inhibitor -Presented with creatinine of 2.34, baseline 0.6 -At the time of discharge creatinine 1.2, improved with fluids   Abdominal ascites secondary to malignancy Status post paracentesis with 2.8 L fluid removed on 10/1, negative for SBP Fluid cytology positive for malignancy IR placed Pleurx catheter to avoid recurrence of malignant ascites, completed on 10/8  Acute combined systolic and diastolic CHF with nonischemic cardiomyopathy 2D echo in March showed normal EF, continue to hold aspirin due to concern for GI bleed Hold ACE inhibitor due to renal insufficiency, continue statin Patient was seen by cardiology, no further work-up Patient can be placed on low-dose of Lasix as renal functioning permits   Acute blood loss anemia/iron deficiency anemia Patient had almost 4 g drop in hemoglobin from baseline, found to be iron deficient. GI was consulted, EGD as documented above Hemoglobin 9.0 on 10/4  comfort care status  Paroxysmal atrial fibrillation -Currently rate controlled, not a good candidate for anticoagulation due to malignancy stage IV and high risk of bleeding   Moderate protein calorie malnutrition with failure to thrive -Continue comfort care status and hospice    History of CVA Left inferior MCA infarct in March 2019, continue Lipitor.  Hold aspirin due to concern for GI bleed Recent follow-up MRI of the brain 3 weeks ago showed  expected evolution of stroke  Dementia Mild at baseline, no acute issues  Constipation Continue stool softeners   Day of Discharge S: No acute issues overnight.,  No pain, fevers.  BP 118/72   Pulse 93   Temp 98.4 F (36.9 C) (Oral)   Resp 18   Ht 5\' 8"  (1.727 m)   Wt 69.8 kg Comment: scale a  SpO2 97%   BMI 23.40 kg/m   Physical Exam: General: Alert and awake oriented x2 not in any acute distress. HEENT: anicteric sclera, pupils reactive to light and accommodation CVS: S1-S2 clear no murmur rubs or gallops Chest: clear to auscultation bilaterally, no wheezing rales or rhonchi Abdomen: soft mildly distended, NBS Extremities: no cyanosis, clubbing or edema noted bilaterally Neuro: Cranial nerves II-XII intact, no focal neurological deficits   The results of significant diagnostics from this hospitalization (including imaging, microbiology, ancillary and laboratory) are listed below for reference.      Procedures/Studies:  Ct Abdomen Wo Contrast  Result Date: 03/03/2018 CLINICAL DATA:  Abdominal mass. Appears to be an abdominal wall hernia versus rectus sheath hematoma or mass. EXAM: CT ABDOMEN WITHOUT CONTRAST TECHNIQUE: Multidetector CT imaging of the abdomen was performed following the standard protocol without IV contrast. COMPARISON:  February 25, 2016 FINDINGS: Lower chest: Mild atelectasis in the bases.  Small effusions. Hepatobiliary: Pneumobilia is consistent with previous sphincterotomy, unchanged, in the  left hepatic lobe. The patient's known left hepatic mass is not well assessed without contrast on today's study. This has the appearance of a hemangioma on previous imaging. The liver is otherwise unremarkable. The contour of the liver is smooth and not nodular. The patient is status post cholecystectomy. Pancreas: Unremarkable. No pancreatic ductal dilatation or surrounding inflammatory changes. Spleen: Normal in size without focal abnormality. Adrenals/Urinary  Tract: Adrenal glands are unremarkable. Kidneys are normal, without renal calculi, focal lesion, or hydronephrosis. Stomach/Bowel: The stomach and small bowel are normal without identified obstruction. The colon is decompressed but does contain contrast. No obvious colonic abnormality. The cecum and appendix, if the patient has 1, are below today's film. Vascular/Lymphatic: Atherosclerotic changes in the nonaneurysmal aorta. Probable shotty and mildly enlarged retroperitoneal nodes, poorly evaluated due to lack of contrast and lack of intra-abdominal fat. An apparent left periaortic node on image 35 measures 15 mm in short axis. Other: Diffuse ascites of uncertain etiology. Lipoma in the right abdominal wall as seen on series 3, image 34. No other abdominal wall mass. Tiny periumbilical hernia. No other hernia is noted. No rectus muscle hematoma identified. Musculoskeletal: No acute or significant osseous findings. IMPRESSION: 1. Diffuse ascites of uncertain etiology. 2. Suggested retroperitoneal adenopathy, poorly evaluated due to lack of contrast and lack of intra-abdominal fat. The nodes are nonspecific but could be reactive or neoplastic. Recommend follow-up after resolution of the patient's ascites for better evaluation. 3. Tiny pleural effusions and mild bibasilar atelectasis. 4. Atherosclerotic changes in the nonaneurysmal aorta. 5. Right abdominal wall lipoma. 6. Tiny periumbilical fat containing hernia. Electronically Signed   By: Dorise Bullion III M.D   On: 03/03/2018 18:17   Dg Chest 2 View  Result Date: 03/03/2018 CLINICAL DATA:  Unwitnessed fall.  Hypertension. EXAM: CHEST - 2 VIEW COMPARISON:  August 09, 2017 FINDINGS: There is atelectatic change in the right mid lower lung zones. There is no edema or consolidation. Heart is borderline enlarged with pulmonary vascularity normal. Aorta is prominent and rather tortuous, stable. There are metallic fragments in the left hemithorax. No pneumothorax. No  bone lesions. IMPRESSION: No edema or consolidation. Areas of mild atelectatic change on the right. Stable cardiac silhouette. Aortic prominence and tortuosity likely reflect chronic hypertensive change. Electronically Signed   By: Lowella Grip III M.D.   On: 03/03/2018 11:44   Ct Head Wo Contrast  Result Date: 03/03/2018 CLINICAL DATA:  Unwitnessed fall EXAM: CT HEAD WITHOUT CONTRAST TECHNIQUE: Contiguous axial images were obtained from the base of the skull through the vertex without intravenous contrast. COMPARISON:  Head CT August 09, 2017 and brain MRI February 08, 2018 FINDINGS: Brain: There is moderate diffuse atrophy. There is no intracranial mass, hemorrhage, extra-axial fluid collection, or midline shift. There is evidence of a prior infarct involving much of the left occipital lobe, stable. There is evidence of a prior infarct in the mid right occipital lobe, stable. There is evidence of a prior infarct at the gray-white junction of the anterior right parietal lobe, stable. Elsewhere there is patchy small vessel disease in the centra semiovale bilaterally. There is no acute appearing infarct. Basal ganglia calcification bilaterally is a physiologic finding. A small calcification in the mid right cerebellum may represent a small granuloma. This finding was present on prior study. Vascular: There is no hyperdense vessel. There is calcification in the left vertebral artery as well as in both carotid siphon regions. Skull: Bony calvarium appears intact. There is a right parietal scalp hematoma. Sinuses/Orbits:  There is opacification in a posterior ethmoid air cell. There is mucosal thickening in several ethmoid air cells bilaterally. There is inward bowing of each medial orbital wall, a finding that may be congenital or may be due to previous trauma. This finding is stable. No intraorbital lesions are evident. Other: Mastoid air cells are clear. IMPRESSION: 1. Right parietal scalp hematoma with  underlying bony calvarium intact. 2. Atrophy with supratentorial small vessel disease. Prior infarcts as noted, largest in the occipital lobes. 3.  No mass or hemorrhage.  No extra-axial fluid collection. 4.  Foci of arterial vascular calcification. 5.  Ethmoid sinus disease noted. Electronically Signed   By: Lowella Grip III M.D.   On: 03/03/2018 11:21   US Renal  Result Date: 03/05/2018 CLINICAL DATA:  Acute tubular necrosis. EXAM: RENAL / URINARY TRACT ULTRASOUND COMPLETE COMPARISON:  CT 2 days ago. FINDINGS: Right Kidney: Length: 11.3 cm. Echogenicity within normal limits. No mass or hydronephrosis visualized. Left Kidney: Length: 12.0 cm. Echogenicity within normal limits. No mass or hydronephrosis visualized. Bladder: Urine is present within the bladder.  No ureteral jet is visualized. There is ascites diffusely distributed. IMPRESSION: Kidneys appear normal by sonography. Normal size and echogenicity. No obstruction. Urine in the bladder. No ureteral jet is identified however. Diffuse ascites. Electronically Signed   By: Nelson Chimes M.D.   On: 03/05/2018 09:27   Ir Perc Athena Masse Perit Cath Wo Port  Result Date: 03/12/2018 INDICATION: 84 year old male with recurrent ascites secondary to gastric carcinoma EXAM: Image guided placement of tunneled peritoneal catheter MEDICATIONS: The patient is currently admitted to the hospital and receiving intravenous antibiotics. The antibiotics were administered within an appropriate time frame prior to the initiation of the procedure. ANESTHESIA/SEDATION: Fentanyl 1.0 mcg IV; Versed 50 mg IV Moderate Sedation Time:  15 minutes The patient was continuously monitored during the procedure by the interventional radiology nurse under my direct supervision. COMPLICATIONS: None PROCEDURE: The procedure, risks, benefits, and alternatives were explained to the patient and the patient's family. Specific risks that were addressed included bleeding, infection, need for further  procedure, chance of delayed hemorrhage, cardiopulmonary collapse, death. Questions regarding the procedure were encouraged and answered. The patient understands and consents to the procedure. The right abdominal wall was prepped with Betadine in a sterile fashion, and a sterile drape was applied covering the operative field. A sterile gown and sterile gloves were used for the procedure. Local anesthesia was provided with 1% Lidocaine. Ultrasound image documentation was performed. After creating a small skin incision, a 19 gauge needle was advanced into the peritoneal cavity under ultrasound guidance. A guide wire was then advanced under fluoroscopy into the space. Access was dilated serially and a 16-French peel-away sheath placed. The skin and subcutaneous tissues were generously infiltrated with 1% lidocaine from the puncture site along the abdomen anteriorly. A small stab incision was made with 11 blade scalpel at the insertion site of the catheter, and the catheter was back tunneled to the site at the puncture. A tunneled CareFusion Pleurex catheter was placed. This was tunneled from the incision 5 cm anterior to the access to the access site. The catheter was advanced through the peel-away sheath. The sheath was then removed. Final catheter positioning was confirmed with a fluoroscopic spot image. The access incision was closed with Derma bond. Dermabond was applied to the catheterization incision. Large volume paracentesis was performed through the new catheter utilizing vacuum containers. The patient tolerated the procedure well and remained hemodynamically stable throughout. No complications  were encountered and no significant blood loss was encountered. IMPRESSION: Status post peritoneal tunneled catheter placement. Signed, Dulcy Fanny. Dellia Nims, RPVI [facility] Electronically Signed   By: Corrie Mckusick D.O.   On: 03/12/2018 10:38   Ir  Paracentesis  Result Date: 03/05/2018 INDICATION: Patient with history of congestive heart failure, stroke, malnutrition, acute kidney injury, suggested retroperitoneal adenopathy on CT, ascites. Request made for diagnostic and therapeutic paracentesis. EXAM: ULTRASOUND GUIDED DIAGNOSTIC AND THERAPEUTIC PARACENTESIS MEDICATIONS: None COMPLICATIONS: None immediate. PROCEDURE: Informed written consent was obtained from the patient after a discussion of the risks, benefits and alternatives to treatment. A timeout was performed prior to the initiation of the procedure. Initial ultrasound scanning demonstrates a moderate amount of ascites within the left mid to lower abdominal quadrant. The left mid to lower abdomen was prepped and draped in the usual sterile fashion. 2% lidocaine was used for local anesthesia. Following this, a 19 gauge, 7-cm, Yueh catheter was introduced. An ultrasound image was saved for documentation purposes. The paracentesis was performed. The catheter was removed and a dressing was applied. The patient tolerated the procedure well without immediate post procedural complication. FINDINGS: A total of approximately 2.8 liters of yellow fluid was removed. Samples were sent to the laboratory as requested by the clinical team. IMPRESSION: Successful ultrasound-guided diagnostic and therapeutic paracentesis yielding 2.8 liters of peritoneal fluid. Read by: Rowe Ellsworth, PA-C Electronically Signed   By: Sandi Mariscal M.D.   On: 03/05/2018 14:30       LAB RESULTS: Basic Metabolic Panel: Recent Labs  Lab 03/07/18 0534 03/08/18 0523  NA 139 142  K 4.2 4.1  CL 108 112*  CO2 25 24  GLUCOSE 89 84  BUN 22 18  CREATININE 1.52* 1.27*  CALCIUM 8.2* 8.2*  MG  --  1.7  PHOS  --  3.6   Liver Function Tests: Recent Labs  Lab 03/08/18 0523  ALBUMIN 2.0*   No results for input(s): LIPASE, AMYLASE in the last 168 hours. No results for input(s): AMMONIA in the last 168 hours. CBC: Recent  Labs  Lab 03/07/18 0534 03/08/18 0520  WBC 6.0  --   HGB 9.9* 9.0*  HCT 33.4* 30.1*  MCV 92.8  --   PLT 454*  --    Cardiac Enzymes: No results for input(s): CKTOTAL, CKMB, CKMBINDEX, TROPONINI in the last 168 hours. BNP: Invalid input(s): POCBNP CBG: Recent Labs  Lab 03/12/18 0752 03/13/18 0733  GLUCAP 85 107*      Disposition and Follow-up: Discharge Instructions    Increase activity slowly   Complete by:  As directed        DISPOSITION: Nursing facility with hospice   DISCHARGE FOLLOW-UP  Contact information for follow-up providers    Pixie Casino, MD. Schedule an appointment as soon as possible for a visit in 2 week(s).   Specialty:  Cardiology Why:  as needed  Contact information: [place] [id number] [phone]            Contact information for after-discharge care    Destination    [facility] .   Service:  Skilled Nursing Contact information: [address] [place] [phone]                   Time coordinating discharge:  35 mins   Signed:   Estill Cotta M.D. [facility] 03/13/2018, 10:40 AM Pager: [phone]

## 2018-03-13 NOTE — Progress Notes (Signed)
Staples (3) removed from patient's head per MD request. Site is clean, dry, and intact.

## 2018-03-13 NOTE — Progress Notes (Signed)
Report given to [facility]. IV and telemetry removed. Patient is stable and waiting for PTAR.

## 2018-03-14 ENCOUNTER — Encounter: Payer: Self-pay | Admitting: Internal Medicine

## 2018-03-14 ENCOUNTER — Non-Acute Institutional Stay (SKILLED_NURSING_FACILITY): Payer: Medicare Other | Admitting: Internal Medicine

## 2018-03-14 DIAGNOSIS — I10 Essential (primary) hypertension: Secondary | ICD-10-CM

## 2018-03-14 DIAGNOSIS — I480 Paroxysmal atrial fibrillation: Secondary | ICD-10-CM

## 2018-03-14 DIAGNOSIS — R180 Malignant ascites: Secondary | ICD-10-CM | POA: Diagnosis not present

## 2018-03-14 DIAGNOSIS — R5381 Other malaise: Secondary | ICD-10-CM | POA: Diagnosis not present

## 2018-03-14 DIAGNOSIS — I739 Peripheral vascular disease, unspecified: Secondary | ICD-10-CM

## 2018-03-14 DIAGNOSIS — E440 Moderate protein-calorie malnutrition: Secondary | ICD-10-CM

## 2018-03-14 DIAGNOSIS — C799 Secondary malignant neoplasm of unspecified site: Secondary | ICD-10-CM

## 2018-03-14 DIAGNOSIS — F0150 Vascular dementia without behavioral disturbance: Secondary | ICD-10-CM

## 2018-03-14 NOTE — Progress Notes (Signed)
Patient ID: [name], male   DOB: Apr 19, 1930, 84 y.o.   MRN: [id number]   Provider:  DR Arletha Grippe Location:  [facility] Room Number: 762 A Place of Service:  SNF (31)  PCP: Patient, [name] Patient Care Team: Patient, [name] as PCP - General (General Practice) Hilty, Nadean Corwin, MD as PCP - Cardiology (Cardiology) Lowella Bandy, MD as Attending Physician (Urology)  Extended Emergency Contact Information Primary Emergency Contact: Jeronimo Greaves [place] Phone: [phone] Relation: Friend Secondary Emergency Contact: Terrilyn Saver [place] Phone: [phone] Relation: Friend  Code Status: DNR Goals of Care: Advanced Directive information Advanced Directives 03/14/2018  Does Patient Have a Medical Advance Directive? Yes  Type of Advance Directive [place]  Does patient want to make changes to medical advance directive? No - Patient declined  Copy of [place] in Chart? Yes  Would patient like information on creating a medical advance directive? -  Pre-existing out of facility DNR order (yellow form or pink MOST form) -      Chief Complaint  Patient presents with  . Readmit To SNF    Readmission    HPI: Patient is a 84 y.o. male seen today for re-admission to SNF following hospital stay for stage 4 metastatic gastric adenoCA, AKI, abdominal ascites, PAF, vascular dementia, hx CVA. He underwent pleurx cath by IR. Declined further tx or w/u. Palliative care recommended hospice. meds adjusted. He presents to SNF for long term care.   Today he reports no concerns. Decreased appetite. No dysphagia. No falls. Sleeps ok. He is a poor historian due to dementia. Hx obtained from chart.  Chronic ischemic left MCA stroke - stable; no longer on ASA  daily   PAD - stable off ASA 81 mg daily   HTN - stable on metoprolol  Protein calorie malnutrition - he gets ensure twice daily; albumin  3.1  Vascular dementia - stable without cognition med; gets nutritional supplements per facility protocol   Past Medical History:  Diagnosis Date  . BPH (benign prostatic hyperplasia)   . Cataract   . Essential hypertension 05/08/2013  . Exertional dyspnea    with exertion  . Hypertension   . Mitral valvular regurgitation June 2014   Mild to moderate MR on echocardiogram  . PAD (peripheral artery disease) ([place])  December 2014   Lower extremity Dopplers/ABIs: RABI 0.29, LABI 0.66; bilateral external iliacs with significant diameter reduction; R. SFA occlusive disease throughout into popliteal artery with 0 vessel runoff. Anterior tibial reconstitutes distally. 70-99% reduction in all SFA. One-vessel runoff (anterior tibial)  . Stroke ([place])   . Syncope and collapse 03/03/2018   Past Surgical History:  Procedure Laterality Date  . ABDOMINAL ANGIOGRAM  07/07/2013   Procedure: ABDOMINAL ANGIOGRAM;  Surgeon: Lorretta Harp, MD;  Location: [facility] CATH LAB;  Service: Cardiovascular;;  . BIOPSY  03/07/2018   Procedure: BIOPSY;  Surgeon: Laurence Spates, MD;  Location: [facility] ENDOSCOPY;  Service: Endoscopy;;  . CHOLECYSTECTOMY  07/04/2010  . ERCP N/A 11/27/2012   Procedure: ENDOSCOPIC RETROGRADE CHOLANGIOPANCREATOGRAPHY (ERCP);  Surgeon: Inda Castle, MD;  Location: [facility] ENDOSCOPY;  Service: Endoscopy;  Laterality: N/A;  . ESOPHAGOGASTRODUODENOSCOPY (EGD) WITH PROPOFOL N/A 03/07/2018   Procedure: ESOPHAGOGASTRODUODENOSCOPY (EGD) WITH PROPOFOL;  Surgeon: Laurence Spates, MD;  Location: [facility];  Service: Endoscopy;  Laterality: N/A;  . EYE SURGERY Right   . IR PARACENTESIS  03/05/2018  . IR PERC TUN PERIT CATH  WO PORT S&I Dartha Lodge  03/12/2018  . LEFT HEART CATHETERIZATION WITH CORONARY ANGIOGRAM N/A 07/07/2013   Procedure: LEFT HEART CATHETERIZATION WITH CORONARY ANGIOGRAM;  Surgeon: Lorretta Harp, MD;  Location[facility] CATH LAB;  Service: Cardiovascular;  Laterality: N/A;  . LOWER EXTREMITY ANGIOGRAM N/A  07/07/2013   Procedure: LOWER EXTREMITY ANGIOGRAM;  Surgeon: Lorretta Harp, MD;  Location: [facility] CATH LAB;  Service: Cardiovascular;  Laterality: N/A;  . NM MYOVIEW LTD  December 2014   Apical thinning but no ischemia. EF 55%  . PROSTATE SURGERY     Dr. Lowella Bandy  . RECTAL EXAM UNDER ANESTHESIA  03/07/2018   Procedure: RECTAL EXAM UNDER ANESTHESIA;  Surgeon: Laurence Spates, MD;  Location: [facility] ENDOSCOPY;  Service: Endoscopy;;  . TRANSTHORACIC ECHOCARDIOGRAM  June 2014   Basal septal thickening with moderate LVH. EF 65%. Normal wall motion. Diastolic dysfunction/NOS. Aortic sclerosis without stenosis. Mild to moderate MR    reports that he quit smoking about 37 years ago. His smoking use included cigarettes. He has never used smokeless tobacco. He reports that he drank about 4.0 standard drinks of alcohol per week. He reports that he does not use drugs. Social History   Socioeconomic History  . Marital status: Single    Spouse name: Not on file  . Number of children: Not on file  . Years of education: Not on file  . Highest education level: Not on file  Occupational History  . Occupation: Retired    Fish farm manager: RETIRED  Social Needs  . Financial resource strain: Not hard at all  . Food insecurity:    Worry: Never true    Inability: Never true  . Transportation needs:    Medical: No    Non-medical: No  Tobacco Use  . Smoking status: Former Smoker    Types: Cigarettes    Last attempt to quit: 11/25/1980    Years since quitting: 37.3  . Smokeless tobacco: Never Used  Substance and Sexual Activity  . Alcohol use: Not Currently    Alcohol/week: 4.0 standard drinks    Types: 4 Standard drinks or equivalent per week    Comment: beers  . Drug use: No  . Sexual activity: Not Currently  Lifestyle  . Physical activity:    Days per week: 0 days    Minutes per session: 0 min  . Stress: Not at all  Relationships  . Social connections:    Talks on phone: Once a week    Gets together: Once  a week    Attends religious service: Never    Active member of club or organization: No    Attends meetings of clubs or organizations: Never    Relationship status: Never married  . Intimate partner violence:    Fear of current or ex partner: No    Emotionally abused: No    Physically abused: No    Forced sexual activity: No  Other Topics Concern  . Not on file  Social History Narrative   He is originally from [place]. He has a history of long-standing tobacco use but quit several years ago. He also has a history of former alcohol abuse as well.   He is retired and currently single.    Functional Status Survey:    Family History  Problem Relation Age of Onset  . Diabetes Sister   . Cancer Sister   . Cancer Mother   . Cancer Father   . Cancer Sister   . Heart disease Brother  Health Maintenance  Topic Date Due  . TETANUS/TDAP  08/10/2027  . INFLUENZA VACCINE  Completed  . PNA vac Low Risk Adult  Completed    No Known Allergies  Outpatient Encounter Medications as of 03/14/2018  Medication Sig  . acetaminophen (TYLENOL) 325 MG tablet Take 2 tablets (650 mg total) by mouth every 6 (six) hours as needed for mild pain (or Fever >/= 101).  . bisacodyl (DULCOLAX) 5 MG EC tablet Take 2 tablets (10 mg total) by mouth daily as needed for moderate constipation.  . Cholecalciferol 1000 units tablet Take 1,000 Units by mouth daily.  . metoprolol tartrate (LOPRESSOR) 25 MG tablet Take 0.5 tablets (12.5 mg total) by mouth 2 (two) times daily.  . mirtazapine (REMERON) 15 MG tablet Take 7.5 mg by mouth at bedtime.  . Morphine Sulfate (MORPHINE CONCENTRATE) 10 MG/0.5ML SOLN concentrated solution Take 0.25 mLs (5 mg total) by mouth every 2 (two) hours as needed for severe pain.  Marland Kitchen omeprazole (PRILOSEC) 20 MG capsule Give 2 Capsules (40mg ) by mouth daily with breakfast  . ondansetron (ZOFRAN) 4 MG/5ML solution Take 5 mLs (4 mg total) by mouth 4 (four) times daily -  before meals and  at bedtime.  . polyethylene glycol (MIRALAX / GLYCOLAX) packet Take 17 g by mouth daily.  . sertraline (ZOLOFT) 50 MG tablet Take 1 tablet (50 mg total) by mouth daily.  . [DISCONTINUED] pantoprazole (PROTONIX) 40 MG tablet Take 1 tablet (40 mg total) by mouth 2 (two) times daily. (Patient not taking: Reported on 03/14/2018)   No facility-administered encounter medications on file as of 03/14/2018.     Review of Systems  Unable to perform ROS: Dementia    Vitals:   03/14/18 0830  BP: 122/78  Pulse: 70  Resp: 16  Temp: 99.2 F (37.3 C)  SpO2: 98%  Weight: 153 lb 14 oz (69.8 kg)  Height: 5\' 5"  (1.651 m)   Body mass index is 25.61 kg/m. Physical Exam  Constitutional: He appears well-developed.  Frail appearing, sitting in w/c in NAD  HENT:  Mouth/Throat: Oropharynx is clear and moist.  MMM; no oral thrush  Eyes: Pupils are equal, round, and reactive to light. No scleral icterus.  Neck: Neck supple. Carotid bruit is not present. No thyromegaly present.  Cardiovascular: Normal rate and regular rhythm. Exam reveals no gallop and no friction rub.  Murmur (1/6 SEM) heard. Pulses:      Dorsalis pedis pulses are 0 on the right side, and 0 on the left side.       Posterior tibial pulses are 0 on the right side, and 0 on the left side.  +1 pitting BLE edema; no calf TTP; b/l distal LE discoloration; pedal pulses not palpable  Pulmonary/Chest: Effort normal. He has no wheezes. He has rales (left inspiratory). He exhibits no tenderness.  Abdominal: Soft. Bowel sounds are normal. He exhibits distension. He exhibits no abdominal bruit, no pulsatile midline mass and no mass. There is no hepatomegaly. There is no tenderness. There is no rebound and no guarding. No hernia.  midepigastric freely mobile NT lipoma; pleuryx intact RLQ; dsg left abdomen c/d/i; obese; no fluid wave  Musculoskeletal: He exhibits edema.  Lymphadenopathy:    He has no cervical adenopathy.  Neurological: He is alert.  He has normal reflexes.  Skin: Skin is warm and dry. No rash noted.  Psychiatric: He has a normal mood and affect. His behavior is normal. Thought content normal.    Labs reviewed: Basic Metabolic  Panel: Recent Labs    03/03/18 0919  03/06/18 0448 03/07/18 0534 03/08/18 0523  NA 138   < > 142 139 142  K 4.9   < > 4.3 4.2 4.1  CL 103   < > 111 108 112*  CO2 25   < > 23 25 24   GLUCOSE 97   < > 80 89 84  BUN 30*   < > 23 22 18   CREATININE 2.34*   < > 1.72* 1.52* 1.27*  CALCIUM 8.3*   < > 8.1* 8.2* 8.2*  MG 1.8  --   --   --  1.7  PHOS  --   --   --   --  3.6   < > = values in this interval not displayed.   Liver Function Tests: Recent Labs    08/09/17 2032 08/10/17 0548 03/03/18 0919 03/08/18 0523  AST 21 12* 19  --   ALT 9* 8* 9  --   ALKPHOS 95 82 98  --   BILITOT 1.4* 0.6 1.0  --   PROT 5.9* 5.3* 4.9*  --   ALBUMIN 3.7 3.1* 2.3* 2.0*   Recent Labs    03/03/18 0919  LIPASE 31   Recent Labs    08/09/17 2032  AMMONIA 26   CBC: Recent Labs    08/09/17 2032  03/03/18 0919  03/05/18 0419 03/06/18 0448 03/07/18 0534 03/08/18 0520  WBC 6.3   < > 6.2   < > 3.8* 4.5 6.0  --   NEUTROABS 4.7  --  4.5  --   --   --   --   --   HGB 13.7   < > 10.7*   < > 8.2* 9.2* 9.9* 9.0*  HCT 42.0   < > 36.2*   < > 27.8* 31.1* 33.4* 30.1*  MCV 88.4   < > 94.0   < > 93.0 93.7 92.8  --   PLT 384   < > 379   < > 362 396 454*  --    < > = values in this interval not displayed.   Cardiac Enzymes: Recent Labs    08/10/17 1307 03/03/18 0919 03/03/18 1416 03/03/18 2022  CKTOTAL  --  47*  --   --   TROPONINI <0.03  --  <0.03 <0.03   BNP: Invalid input(s): POCBNP Lab Results  Component Value Date   HGBA1C 5.2 08/10/2017   Lab Results  Component Value Date   TSH 2.318 03/03/2018   No results found for: VITAMINB12 No results found for: FOLATE Lab Results  Component Value Date   IRON 20 (L) 03/03/2018   TIBC 218 (L) 03/03/2018    Imaging and Procedures obtained  prior to SNF admission: Ct Abdomen Wo Contrast  Result Date: 03/03/2018 CLINICAL DATA:  Abdominal mass. Appears to be an abdominal wall hernia versus rectus sheath hematoma or mass. EXAM: CT ABDOMEN WITHOUT CONTRAST TECHNIQUE: Multidetector CT imaging of the abdomen was performed following the standard protocol without IV contrast. COMPARISON:  February 25, 2016 FINDINGS: Lower chest: Mild atelectasis in the bases.  Small effusions. Hepatobiliary: Pneumobilia is consistent with previous sphincterotomy, unchanged, in the left hepatic lobe. The patient's known left hepatic mass is not well assessed without contrast on today's study. This has the appearance of a hemangioma on previous imaging. The liver is otherwise unremarkable. The contour of the liver is smooth and not nodular. The patient is status post cholecystectomy. Pancreas: Unremarkable. No pancreatic ductal dilatation  or surrounding inflammatory changes. Spleen: Normal in size without focal abnormality. Adrenals/Urinary Tract: Adrenal glands are unremarkable. Kidneys are normal, without renal calculi, focal lesion, or hydronephrosis. Stomach/Bowel: The stomach and small bowel are normal without identified obstruction. The colon is decompressed but does contain contrast. No obvious colonic abnormality. The cecum and appendix, if the patient has 1, are below today's film. Vascular/Lymphatic: Atherosclerotic changes in the nonaneurysmal aorta. Probable shotty and mildly enlarged retroperitoneal nodes, poorly evaluated due to lack of contrast and lack of intra-abdominal fat. An apparent left periaortic node on image 35 measures 15 mm in short axis. Other: Diffuse ascites of uncertain etiology. Lipoma in the right abdominal wall as seen on series 3, image 34. No other abdominal wall mass. Tiny periumbilical hernia. No other hernia is noted. No rectus muscle hematoma identified. Musculoskeletal: No acute or significant osseous findings. IMPRESSION: 1. Diffuse  ascites of uncertain etiology. 2. Suggested retroperitoneal adenopathy, poorly evaluated due to lack of contrast and lack of intra-abdominal fat. The nodes are nonspecific but could be reactive or neoplastic. Recommend follow-up after resolution of the patient's ascites for better evaluation. 3. Tiny pleural effusions and mild bibasilar atelectasis. 4. Atherosclerotic changes in the nonaneurysmal aorta. 5. Right abdominal wall lipoma. 6. Tiny periumbilical fat containing hernia. Electronically Signed   By: Dorise Bullion III M.D   On: 03/03/2018 18:17   Dg Chest 2 View  Result Date: 03/03/2018 CLINICAL DATA:  Unwitnessed fall.  Hypertension. EXAM: CHEST - 2 VIEW COMPARISON:  August 09, 2017 FINDINGS: There is atelectatic change in the right mid lower lung zones. There is no edema or consolidation. Heart is borderline enlarged with pulmonary vascularity normal. Aorta is prominent and rather tortuous, stable. There are metallic fragments in the left hemithorax. No pneumothorax. No bone lesions. IMPRESSION: No edema or consolidation. Areas of mild atelectatic change on the right. Stable cardiac silhouette. Aortic prominence and tortuosity likely reflect chronic hypertensive change. Electronically Signed   By: Lowella Grip III M.D.   On: 03/03/2018 11:44   Ct Head Wo Contrast  Result Date: 03/03/2018 CLINICAL DATA:  Unwitnessed fall EXAM: CT HEAD WITHOUT CONTRAST TECHNIQUE: Contiguous axial images were obtained from the base of the skull through the vertex without intravenous contrast. COMPARISON:  Head CT August 09, 2017 and brain MRI February 08, 2018 FINDINGS: Brain: There is moderate diffuse atrophy. There is no intracranial mass, hemorrhage, extra-axial fluid collection, or midline shift. There is evidence of a prior infarct involving much of the left occipital lobe, stable. There is evidence of a prior infarct in the mid right occipital lobe, stable. There is evidence of a prior infarct at the gray-white  junction of the anterior right parietal lobe, stable. Elsewhere there is patchy small vessel disease in the centra semiovale bilaterally. There is no acute appearing infarct. Basal ganglia calcification bilaterally is a physiologic finding. A small calcification in the mid right cerebellum may represent a small granuloma. This finding was present on prior study. Vascular: There is no hyperdense vessel. There is calcification in the left vertebral artery as well as in both carotid siphon regions. Skull: Bony calvarium appears intact. There is a right parietal scalp hematoma. Sinuses/Orbits: There is opacification in a posterior ethmoid air cell. There is mucosal thickening in several ethmoid air cells bilaterally. There is inward bowing of each medial orbital wall, a finding that may be congenital or may be due to previous trauma. This finding is stable. No intraorbital lesions are evident. Other: Mastoid air cells are  clear. IMPRESSION: 1. Right parietal scalp hematoma with underlying bony calvarium intact. 2. Atrophy with supratentorial small vessel disease. Prior infarcts as noted, largest in the occipital lobes. 3.  No mass or hemorrhage.  No extra-axial fluid collection. 4.  Foci of arterial vascular calcification. 5.  Ethmoid sinus disease noted. Electronically Signed   By: Lowella Grip III M.D.   On: 03/03/2018 11:21    Assessment/Plan   ICD-10-CM   1. Metastatic cancer ([place]) C79.9   2. Malignant ascites R18.0   3. Physical deconditioning R53.81   4. Paroxysmal atrial fibrillation ([facility]) I48.0   5. Vascular dementia without behavioral disturbance ([facility]) F01.50   6. Malnutrition of moderate degree E44.0   7. PAD (peripheral artery disease) ([facility]) I73.9   8. Essential hypertension I10      Cont current meds as ordered  PT/OT/ST as indicated  Nutritional supplements as ordered per facility protocol  Hospice to follow  GOAL: keep him comfortable; prognosis is poor due to stage 4 gastric  cancer. Communicated with pt and nursing  Will follow.  Labs/tests ordered:  none    Analiya Porco S. Perlie Gold  [facility] [address] [place], [place] [address] [phone] Cell (Monday-Friday 8 AM - 5 PM) [phone] After 5 PM and follow prompts

## 2018-03-20 LAB — HEPATIC FUNCTION PANEL
ALT: 6 — AB (ref 10–40)
AST: 16 (ref 14–40)
Alkaline Phosphatase: 118 (ref 25–125)
BILIRUBIN, TOTAL: 0.3

## 2018-03-20 LAB — CBC AND DIFFERENTIAL
HEMATOCRIT: 28 — AB (ref 41–53)
HEMOGLOBIN: 9 — AB (ref 13.5–17.5)
Neutrophils Absolute: 4
Platelets: 205 (ref 150–399)
WBC: 5.9

## 2018-03-20 LAB — BASIC METABOLIC PANEL
BUN: 16 (ref 4–21)
Creatinine: 0.9 (ref 0.6–1.3)
Glucose: 78
POTASSIUM: 4.3 (ref 3.4–5.3)
Sodium: 144 (ref 137–147)

## 2018-03-21 ENCOUNTER — Other Ambulatory Visit: Payer: Self-pay | Admitting: Internal Medicine

## 2018-03-21 ENCOUNTER — Encounter: Payer: Self-pay | Admitting: Internal Medicine

## 2018-03-21 ENCOUNTER — Other Ambulatory Visit (HOSPITAL_COMMUNITY): Payer: Self-pay | Admitting: Internal Medicine

## 2018-03-21 ENCOUNTER — Telehealth: Payer: Self-pay | Admitting: Physician Assistant

## 2018-03-21 ENCOUNTER — Non-Acute Institutional Stay (SKILLED_NURSING_FACILITY): Payer: Medicare Other | Admitting: Internal Medicine

## 2018-03-21 DIAGNOSIS — R180 Malignant ascites: Secondary | ICD-10-CM

## 2018-03-21 DIAGNOSIS — R188 Other ascites: Secondary | ICD-10-CM

## 2018-03-21 DIAGNOSIS — C799 Secondary malignant neoplasm of unspecified site: Secondary | ICD-10-CM | POA: Diagnosis not present

## 2018-03-21 DIAGNOSIS — I480 Paroxysmal atrial fibrillation: Secondary | ICD-10-CM

## 2018-03-21 DIAGNOSIS — E440 Moderate protein-calorie malnutrition: Secondary | ICD-10-CM

## 2018-03-21 DIAGNOSIS — R5381 Other malaise: Secondary | ICD-10-CM

## 2018-03-21 DIAGNOSIS — R627 Adult failure to thrive: Secondary | ICD-10-CM | POA: Diagnosis not present

## 2018-03-21 NOTE — Progress Notes (Signed)
  Patient's nurse Lilly from the SNF called and reported that his PleurX catheter was not draining properly and is leaking around the site and saturating the dressings.  Discussed with Earleen Newport. Hopefully we can simply check and it and troubleshoot it.   His SNF will have him here tomorrow at 10 am to check it. He will be NPO after MN in case revision is needed.  They also understand to bring a vacuum bottle with him so we can hook it up to evaluate the issue.  Ken Bonn S Jaishawn Witzke PA-C 03/21/2018 2:03 PM

## 2018-03-21 NOTE — Progress Notes (Signed)
Patient ID: [name], male   DOB: 1929-08-08, 84 y.o.   MRN: [id number]   Location:  [facility] Room Number: 798 A Place of Service:  SNF (31) Provider:  DR Arletha Grippe  Patient, [name]  Patient Care Team: Patient, [name] as PCP - General (General Practice) Hilty, Nadean Corwin, MD as PCP - Cardiology (Cardiology) Lowella Bandy, MD as Attending Physician (Urology)  Extended Emergency Contact Information Primary Emergency Contact: [place] Phone: [phone] Relation: Friend Secondary Emergency Contact: Terrilyn Saver Mobile Phone: [phone] Relation: Friend  Code Status:  DNR Goals of care: Advanced Directive information Advanced Directives 03/21/2018  Does Patient Have a Medical Advance Directive? Yes  Type of Advance Directive [place]  Does patient want to make changes to medical advance directive? No - Patient declined  Copy of [place] in Chart? Yes  Would patient like information on creating a medical advance directive? -  Pre-existing out of facility DNR order (yellow form or pink MOST form) -     Chief Complaint  Patient presents with  . Acute Visit    Care Plan Meeting    HPI:  Pt is a 84 y.o. male seen today for an acute visit for care plan meeting. Pt's brother Education officer, community) and sister-in-law present along with SW, dietary, therapy and [employer]. Pt with steady decline in last several days since re-admission to SNF. Pt with decreased po intake and increased fatigue. No pain. He no longer desires to shower as before. Not participating in OT and gait unsteady with PT. He has had cognitive impairment.  Nursing reports drain site has been leaking around it for the last day or 2. Unable to locate equipment to drain at the time of care plan meeting..  Past Medical History:  Diagnosis Date  . BPH (benign prostatic hyperplasia)   . Cataract   . Essential  hypertension 05/08/2013  . Exertional dyspnea    with exertion  . Hypertension   . Mitral valvular regurgitation June 2014   Mild to moderate MR on echocardiogram  . PAD (peripheral artery disease) ([place])  December 2014   Lower extremity Dopplers/ABIs: RABI 0.29, LABI 0.66; bilateral external iliacs with significant diameter reduction; R. SFA occlusive disease throughout into popliteal artery with 0 vessel runoff. Anterior tibial reconstitutes distally. 70-99% reduction in all SFA. One-vessel runoff (anterior tibial)  . Stroke ([place])   . Syncope and collapse 03/03/2018   Past Surgical History:  Procedure Laterality Date  . ABDOMINAL ANGIOGRAM  07/07/2013   Procedure: ABDOMINAL ANGIOGRAM;  Surgeon: Lorretta Harp, MD;  Location: [facility] CATH LAB;  Service: Cardiovascular;;  . BIOPSY  03/07/2018   Procedure: BIOPSY;  Surgeon: Laurence Spates, MD;  Location: [facility] ENDOSCOPY;  Service: Endoscopy;;  . CHOLECYSTECTOMY  07/04/2010  . ERCP N/A 11/27/2012   Procedure: ENDOSCOPIC RETROGRADE CHOLANGIOPANCREATOGRAPHY (ERCP);  Surgeon: Inda Castle, MD;  Location: [facility] ENDOSCOPY;  Service: Endoscopy;  Laterality: N/A;  . ESOPHAGOGASTRODUODENOSCOPY (EGD) WITH PROPOFOL N/A 03/07/2018   Procedure: ESOPHAGOGASTRODUODENOSCOPY (EGD) WITH PROPOFOL;  Surgeon: Laurence Spates, MD;  Location: [facility];  Service: Endoscopy;  Laterality: N/A;  . EYE SURGERY Right   . IR PARACENTESIS  03/05/2018  . IR PERC TUN PERIT CATH WO PORT S&I Dartha Lodge  03/12/2018  . LEFT HEART CATHETERIZATION WITH CORONARY ANGIOGRAM N/A 07/07/2013   Procedure: LEFT HEART CATHETERIZATION WITH CORONARY ANGIOGRAM;  Surgeon: Lorretta Harp, MD;  Location: [facility]  CATH LAB;  Service: Cardiovascular;  Laterality: N/A;  . LOWER EXTREMITY ANGIOGRAM N/A 07/07/2013   Procedure: LOWER EXTREMITY ANGIOGRAM;  Surgeon: Lorretta Harp, MD;  Location: [facility] CATH LAB;  Service: Cardiovascular;  Laterality: N/A;  . NM MYOVIEW LTD  December 2014   Apical thinning but no ischemia.  EF 55%  . PROSTATE SURGERY     Dr. Lowella Bandy  . RECTAL EXAM UNDER ANESTHESIA  03/07/2018   Procedure: RECTAL EXAM UNDER ANESTHESIA;  Surgeon: Laurence Spates, MD;  Location: [facility] ENDOSCOPY;  Service: Endoscopy;;  . TRANSTHORACIC ECHOCARDIOGRAM  June 2014   Basal septal thickening with moderate LVH. EF 65%. Normal wall motion. Diastolic dysfunction/NOS. Aortic sclerosis without stenosis. Mild to moderate MR    No Known Allergies  Outpatient Encounter Medications as of 03/21/2018  Medication Sig  . acetaminophen (TYLENOL) 325 MG tablet Take 2 tablets (650 mg total) by mouth every 6 (six) hours as needed for mild pain (or Fever >/= 101).  . bisacodyl (DULCOLAX) 5 MG EC tablet Take 2 tablets (10 mg total) by mouth daily as needed for moderate constipation.  . Cholecalciferol 1000 units tablet Take 1,000 Units by mouth daily.  . metoprolol tartrate (LOPRESSOR) 25 MG tablet Take 0.5 tablets (12.5 mg total) by mouth 2 (two) times daily.  . mirtazapine (REMERON) 15 MG tablet Take 7.5 mg by mouth at bedtime.  . Morphine Sulfate (MORPHINE CONCENTRATE) 10 MG/0.5ML SOLN concentrated solution Take 0.25 mLs (5 mg total) by mouth every 2 (two) hours as needed for severe pain.  . NON FORMULARY Diet Type:  Regular texture  . omeprazole (PRILOSEC) 20 MG capsule Give 2 Capsules (40mg ) by mouth daily with breakfast  . ondansetron (ZOFRAN) 4 MG/5ML solution Take 5 mLs (4 mg total) by mouth 4 (four) times daily -  before meals and at bedtime.  . polyethylene glycol (MIRALAX / GLYCOLAX) packet Take 17 g by mouth daily.  . sertraline (ZOLOFT) 50 MG tablet Take 1 tablet (50 mg total) by mouth daily.   No facility-administered encounter medications on file as of 03/21/2018.     Review of Systems  Unable to perform ROS: Other (confused)    Immunization History  Administered Date(s) Administered  . Influenza Split 02/08/2013, 02/03/2014, 03/05/2015  . Influenza, High Dose Seasonal PF 02/27/2017, 03/05/2018  .  Pneumococcal Conjugate-13 11/11/2015  . Pneumococcal Polysaccharide-23 03/04/2014  . Tdap 08/09/2017   Pertinent  Health Maintenance Due  Topic Date Due  . INFLUENZA VACCINE  Completed  . PNA vac Low Risk Adult  Completed   Fall Risk  01/30/2018 11/29/2017 10/30/2017 11/09/2016 10/10/2016  Falls in the past year? No Yes No No No  Number falls in past yr: - 1 - - -  Injury with Fall? - No - - -   Functional Status Survey:    Vitals:   03/21/18 1310  BP: 120/64  Pulse: 72  Resp: 17  Temp: (!) 97.4 F (36.3 C)  SpO2: 97%  Weight: 154 lb 6.4 oz (70 kg)  Height: 5\' 5"  (1.651 m)   Body mass index is 25.69 kg/m. Physical Exam  Constitutional: He appears well-developed.  Frail appearing in NAD, sitting on bed  HENT:  Mouth/Throat: Oropharynx is clear and moist.  MMM; no oral thrush  Eyes: Pupils are equal, round, and reactive to light. No scleral icterus.  Neck: Neck supple. Carotid bruit is not present.  Cardiovascular: Normal rate and intact distal pulses. An irregularly irregular rhythm present. Exam reveals no gallop and  no friction rub.  Murmur heard.  Systolic murmur is present with a grade of 1/6. no distal LE swelling. No calf TTP  Pulmonary/Chest: Effort normal and breath sounds normal. He has no wheezes. He has no rales. He exhibits no tenderness.  Abdominal: Soft. Bowel sounds are normal. He exhibits distension, fluid wave and ascites. He exhibits no abdominal bruit, no pulsatile midline mass and no mass. There is hepatomegaly. There is no tenderness. There is no rebound and no guarding. No hernia.    midepigastric right sided freely mobile grape sized lipoma  Musculoskeletal: He exhibits edema (small and large joints).  Lymphadenopathy:    He has no cervical adenopathy.  Neurological: He is alert. He has normal reflexes.  Skin: Skin is warm and dry. No rash noted.  Psychiatric: He has a normal mood and affect. His behavior is normal. Thought content normal.    Labs  reviewed: Recent Labs    03/03/18 0919  03/06/18 0448 03/07/18 0534 03/08/18 0523  NA 138   < > 142 139 142  K 4.9   < > 4.3 4.2 4.1  CL 103   < > 111 108 112*  CO2 25   < > 23 25 24   GLUCOSE 97   < > 80 89 84  BUN 30*   < > 23 22 18   CREATININE 2.34*   < > 1.72* 1.52* 1.27*  CALCIUM 8.3*   < > 8.1* 8.2* 8.2*  MG 1.8  --   --   --  1.7  PHOS  --   --   --   --  3.6   < > = values in this interval not displayed.   Recent Labs    08/09/17 2032 08/10/17 0548 03/03/18 0919 03/08/18 0523  AST 21 12* 19  --   ALT 9* 8* 9  --   ALKPHOS 95 82 98  --   BILITOT 1.4* 0.6 1.0  --   PROT 5.9* 5.3* 4.9*  --   ALBUMIN 3.7 3.1* 2.3* 2.0*   Recent Labs    08/09/17 2032  03/03/18 0919  03/05/18 0419 03/06/18 0448 03/07/18 0534 03/08/18 0520  WBC 6.3   < > 6.2   < > 3.8* 4.5 6.0  --   NEUTROABS 4.7  --  4.5  --   --   --   --   --   HGB 13.7   < > 10.7*   < > 8.2* 9.2* 9.9* 9.0*  HCT 42.0   < > 36.2*   < > 27.8* 31.1* 33.4* 30.1*  MCV 88.4   < > 94.0   < > 93.0 93.7 92.8  --   PLT 384   < > 379   < > 362 396 454*  --    < > = values in this interval not displayed.   Lab Results  Component Value Date   TSH 2.318 03/03/2018   Lab Results  Component Value Date   HGBA1C 5.2 08/10/2017   Lab Results  Component Value Date   CHOL 101 08/10/2017   HDL 33 (L) 08/10/2017   LDLCALC 53 08/10/2017   TRIG 74 08/10/2017   CHOLHDL 3.1 08/10/2017    Significant Diagnostic Results in last 30 days:  Ct Abdomen Wo Contrast  Result Date: 03/03/2018 CLINICAL DATA:  Abdominal mass. Appears to be an abdominal wall hernia versus rectus sheath hematoma or mass. EXAM: CT ABDOMEN WITHOUT CONTRAST TECHNIQUE: Multidetector CT imaging of the abdomen was  performed following the standard protocol without IV contrast. COMPARISON:  February 25, 2016 FINDINGS: Lower chest: Mild atelectasis in the bases.  Small effusions. Hepatobiliary: Pneumobilia is consistent with previous sphincterotomy, unchanged,  in the left hepatic lobe. The patient's known left hepatic mass is not well assessed without contrast on today's study. This has the appearance of a hemangioma on previous imaging. The liver is otherwise unremarkable. The contour of the liver is smooth and not nodular. The patient is status post cholecystectomy. Pancreas: Unremarkable. No pancreatic ductal dilatation or surrounding inflammatory changes. Spleen: Normal in size without focal abnormality. Adrenals/Urinary Tract: Adrenal glands are unremarkable. Kidneys are normal, without renal calculi, focal lesion, or hydronephrosis. Stomach/Bowel: The stomach and small bowel are normal without identified obstruction. The colon is decompressed but does contain contrast. No obvious colonic abnormality. The cecum and appendix, if the patient has 1, are below today's film. Vascular/Lymphatic: Atherosclerotic changes in the nonaneurysmal aorta. Probable shotty and mildly enlarged retroperitoneal nodes, poorly evaluated due to lack of contrast and lack of intra-abdominal fat. An apparent left periaortic node on image 35 measures 15 mm in short axis. Other: Diffuse ascites of uncertain etiology. Lipoma in the right abdominal wall as seen on series 3, image 34. No other abdominal wall mass. Tiny periumbilical hernia. No other hernia is noted. No rectus muscle hematoma identified. Musculoskeletal: No acute or significant osseous findings. IMPRESSION: 1. Diffuse ascites of uncertain etiology. 2. Suggested retroperitoneal adenopathy, poorly evaluated due to lack of contrast and lack of intra-abdominal fat. The nodes are nonspecific but could be reactive or neoplastic. Recommend follow-up after resolution of the patient's ascites for better evaluation. 3. Tiny pleural effusions and mild bibasilar atelectasis. 4. Atherosclerotic changes in the nonaneurysmal aorta. 5. Right abdominal wall lipoma. 6. Tiny periumbilical fat containing hernia. Electronically Signed   By: Dorise Bullion III M.D   On: 03/03/2018 18:17   Dg Chest 2 View  Result Date: 03/03/2018 CLINICAL DATA:  Unwitnessed fall.  Hypertension. EXAM: CHEST - 2 VIEW COMPARISON:  August 09, 2017 FINDINGS: There is atelectatic change in the right mid lower lung zones. There is no edema or consolidation. Heart is borderline enlarged with pulmonary vascularity normal. Aorta is prominent and rather tortuous, stable. There are metallic fragments in the left hemithorax. No pneumothorax. No bone lesions. IMPRESSION: No edema or consolidation. Areas of mild atelectatic change on the right. Stable cardiac silhouette. Aortic prominence and tortuosity likely reflect chronic hypertensive change. Electronically Signed   By: Lowella Grip III M.D.   On: 03/03/2018 11:44   Ct Head Wo Contrast  Result Date: 03/03/2018 CLINICAL DATA:  Unwitnessed fall EXAM: CT HEAD WITHOUT CONTRAST TECHNIQUE: Contiguous axial images were obtained from the base of the skull through the vertex without intravenous contrast. COMPARISON:  Head CT August 09, 2017 and brain MRI February 08, 2018 FINDINGS: Brain: There is moderate diffuse atrophy. There is no intracranial mass, hemorrhage, extra-axial fluid collection, or midline shift. There is evidence of a prior infarct involving much of the left occipital lobe, stable. There is evidence of a prior infarct in the mid right occipital lobe, stable. There is evidence of a prior infarct at the gray-white junction of the anterior right parietal lobe, stable. Elsewhere there is patchy small vessel disease in the centra semiovale bilaterally. There is no acute appearing infarct. Basal ganglia calcification bilaterally is a physiologic finding. A small calcification in the mid right cerebellum may represent a small granuloma. This finding was present on prior study. Vascular:  There is no hyperdense vessel. There is calcification in the left vertebral artery as well as in both carotid siphon regions. Skull: Bony  calvarium appears intact. There is a right parietal scalp hematoma. Sinuses/Orbits: There is opacification in a posterior ethmoid air cell. There is mucosal thickening in several ethmoid air cells bilaterally. There is inward bowing of each medial orbital wall, a finding that may be congenital or may be due to previous trauma. This finding is stable. No intraorbital lesions are evident. Other: Mastoid air cells are clear. IMPRESSION: 1. Right parietal scalp hematoma with underlying bony calvarium intact. 2. Atrophy with supratentorial small vessel disease. Prior infarcts as noted, largest in the occipital lobes. 3.  No mass or hemorrhage.  No extra-axial fluid collection. 4.  Foci of arterial vascular calcification. 5.  Ethmoid sinus disease noted. Electronically Signed   By: Lowella Grip III M.D.   On: 03/03/2018 11:21   US Renal  Result Date: 03/05/2018 CLINICAL DATA:  Acute tubular necrosis. EXAM: RENAL / URINARY TRACT ULTRASOUND COMPLETE COMPARISON:  CT 2 days ago. FINDINGS: Right Kidney: Length: 11.3 cm. Echogenicity within normal limits. No mass or hydronephrosis visualized. Left Kidney: Length: 12.0 cm. Echogenicity within normal limits. No mass or hydronephrosis visualized. Bladder: Urine is present within the bladder.  No ureteral jet is visualized. There is ascites diffusely distributed. IMPRESSION: Kidneys appear normal by sonography. Normal size and echogenicity. No obstruction. Urine in the bladder. No ureteral jet is identified however. Diffuse ascites. Electronically Signed   By: Nelson Chimes M.D.   On: 03/05/2018 09:27   Ir Perc Athena Masse Perit Cath Wo Port  Result Date: 03/12/2018 INDICATION: 84 year old male with recurrent ascites secondary to gastric carcinoma EXAM: Image guided placement of tunneled peritoneal catheter MEDICATIONS: The patient is currently admitted to the hospital and receiving intravenous antibiotics. The antibiotics were administered within an appropriate time frame  prior to the initiation of the procedure. ANESTHESIA/SEDATION: Fentanyl 1.0 mcg IV; Versed 50 mg IV Moderate Sedation Time:  15 minutes The patient was continuously monitored during the procedure by the interventional radiology nurse under my direct supervision. COMPLICATIONS: None PROCEDURE: The procedure, risks, benefits, and alternatives were explained to the patient and the patient's family. Specific risks that were addressed included bleeding, infection, need for further procedure, chance of delayed hemorrhage, cardiopulmonary collapse, death. Questions regarding the procedure were encouraged and answered. The patient understands and consents to the procedure. The right abdominal wall was prepped with Betadine in a sterile fashion, and a sterile drape was applied covering the operative field. A sterile gown and sterile gloves were used for the procedure. Local anesthesia was provided with 1% Lidocaine. Ultrasound image documentation was performed. After creating a small skin incision, a 19 gauge needle was advanced into the peritoneal cavity under ultrasound guidance. A guide wire was then advanced under fluoroscopy into the space. Access was dilated serially and a 16-French peel-away sheath placed. The skin and subcutaneous tissues were generously infiltrated with 1% lidocaine from the puncture site along the abdomen anteriorly. A small stab incision was made with 11 blade scalpel at the insertion site of the catheter, and the catheter was back tunneled to the site at the puncture. A tunneled CareFusion Pleurex catheter was placed. This was tunneled from the incision 5 cm anterior to the access to the access site. The catheter was advanced through the peel-away sheath. The sheath was then removed. Final catheter positioning was confirmed with a fluoroscopic spot image. The access incision was closed with  Derma bond. Dermabond was applied to the catheterization incision. Large volume paracentesis was performed  through the new catheter utilizing vacuum containers. The patient tolerated the procedure well and remained hemodynamically stable throughout. No complications were encountered and no significant blood loss was encountered. IMPRESSION: Status post peritoneal tunneled catheter placement. Signed, Dulcy Fanny. Dellia Nims, RPVI [facility] Radiology Electronically Signed   By: Corrie Mckusick D.O.   On: 03/12/2018 10:38   Ir Paracentesis  Result Date: 03/05/2018 INDICATION: Patient with history of congestive heart failure, stroke, malnutrition, acute kidney injury, suggested retroperitoneal adenopathy on CT, ascites. Request made for diagnostic and therapeutic paracentesis. EXAM: ULTRASOUND GUIDED DIAGNOSTIC AND THERAPEUTIC PARACENTESIS MEDICATIONS: None COMPLICATIONS: None immediate. PROCEDURE: Informed written consent was obtained from the patient after a discussion of the risks, benefits and alternatives to treatment. A timeout was performed prior to the initiation of the procedure. Initial ultrasound scanning demonstrates a moderate amount of ascites within the left mid to lower abdominal quadrant. The left mid to lower abdomen was prepped and draped in the usual sterile fashion. 2% lidocaine was used for local anesthesia. Following this, a 19 gauge, 7-cm, Yueh catheter was introduced. An ultrasound image was saved for documentation purposes. The paracentesis was performed. The catheter was removed and a dressing was applied. The patient tolerated the procedure well without immediate post procedural complication. FINDINGS: A total of approximately 2.8 liters of yellow fluid was removed. Samples were sent to the laboratory as requested by the clinical team. IMPRESSION: Successful ultrasound-guided diagnostic and therapeutic paracentesis yielding 2.8 liters of peritoneal fluid. Read by: Rowe Simran, PA-C Electronically Signed   By: Sandi Mariscal M.D.   On: 03/05/2018 14:30     Assessment/Plan   ICD-10-CM   1. Malignant ascites R18.0   2. 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 FTT (failure to thrive) in adult R62.7   4. Physical deconditioning R53.81   5. Paroxysmal atrial fibrillation ([facility]) I48.0   6. Moderate protein-calorie malnutrition ([place]) E44.0     Pt desires inpt hospice house if he becomes bedbound - prefer [facility]  Refer to IR for therapeutic paracentesis +/- assess pleurx drain due to leakage with possible drain exchange  Nursing did locate equipment for pleurx drain - 900 cc drained and abdominal distention improved - will still send to IR to reck drain tube/site  Refer to [facility] per family request  Cont current meds as ordered  OOB as tolerated  Family/ staff Communication: care plan mtg completed  Labs/tests ordered: none   Kinnedy Mongiello S. Perlie Gold  [facility] [address] [place], [place] [address] [phone] Cell (Monday-Friday 8 AM - 5 PM) [phone] After 5 PM and follow prompts

## 2018-03-22 ENCOUNTER — Other Ambulatory Visit (HOSPITAL_COMMUNITY): Payer: Self-pay | Admitting: Internal Medicine

## 2018-03-22 ENCOUNTER — Ambulatory Visit (HOSPITAL_COMMUNITY)
Admission: RE | Admit: 2018-03-22 | Discharge: 2018-03-22 | Disposition: A | Discharge status: Home / Self Care | Payer: Medicare Other | Source: Ambulatory Visit | Attending: Internal Medicine | Admitting: Internal Medicine

## 2018-03-22 DIAGNOSIS — R180 Malignant ascites: Secondary | ICD-10-CM | POA: Insufficient documentation

## 2018-03-22 DIAGNOSIS — R188 Other ascites: Secondary | ICD-10-CM

## 2018-03-22 DIAGNOSIS — C169 Malignant neoplasm of stomach, unspecified: Secondary | ICD-10-CM | POA: Insufficient documentation

## 2018-03-22 MED ORDER — LIDOCAINE HCL 1 % IJ SOLN
INTRAMUSCULAR | Status: AC
  Filled 2018-03-22: qty 20

## 2018-03-22 MED ORDER — LIDOCAINE HCL (PF) 1 % IJ SOLN
INTRAMUSCULAR | Status: AC | PRN
  Administered 2018-03-22: 5 mL

## 2018-03-22 NOTE — Discharge Planning (Signed)
[facility] consulted regarding plureX catheter home care and supplies.  Pt is from [facility].  [place] contacted Joaquim Nam, RN (liason for  [facility]) to inquire about staff education surrounding drainage and availability of replacement bottles.  Tammy states order is to drain once weekly (drained yesterday= 975cc) and they DO have replacement bottles available.  No further EDCM needs identified at this time.

## 2018-03-28 ENCOUNTER — Encounter: Payer: Self-pay | Admitting: Adult Health

## 2018-03-28 ENCOUNTER — Non-Acute Institutional Stay (SKILLED_NURSING_FACILITY): Payer: Medicare Other | Admitting: Adult Health

## 2018-03-28 DIAGNOSIS — I6930 Unspecified sequelae of cerebral infarction: Secondary | ICD-10-CM | POA: Diagnosis not present

## 2018-03-28 DIAGNOSIS — F0150 Vascular dementia without behavioral disturbance: Secondary | ICD-10-CM | POA: Diagnosis not present

## 2018-03-28 DIAGNOSIS — C799 Secondary malignant neoplasm of unspecified site: Secondary | ICD-10-CM | POA: Diagnosis not present

## 2018-03-28 NOTE — Progress Notes (Signed)
Location:   [facility] Room Number: 227 A Place of Service:  SNF (31)   CODE STATUS: DNR  No Known Allergies  Chief Complaint  Patient presents with  . Medical Management of Chronic Issues    Metastatic cancer; chronic ischemic left MCA stroke; vascular dementia without behavioral disturbance.     HPI:  He is a 84 year old long term resident of this facility being seen for the management of his chronic illnesses; metastatic cancer; cva; dementia. He is unable to participate in the hpi or ros. He continues to be followed by hospice care. There are no reports of uncontrolled pain; no signs of distress or agitation.   Past Medical History:  Diagnosis Date  . BPH (benign prostatic hyperplasia)   . Cataract   . Essential hypertension 05/08/2013  . Exertional dyspnea    with exertion  . Hypertension   . Mitral valvular regurgitation June 2014   Mild to moderate MR on echocardiogram  . PAD (peripheral artery disease) ([place])  December 2014   Lower extremity Dopplers/ABIs: RABI 0.29, LABI 0.66; bilateral external iliacs with significant diameter reduction; R. SFA occlusive disease throughout into popliteal artery with 0 vessel runoff. Anterior tibial reconstitutes distally. 70-99% reduction in all SFA. One-vessel runoff (anterior tibial)  . Stroke ([place])   . Syncope and collapse 03/03/2018    Past Surgical History:  Procedure Laterality Date  . ABDOMINAL ANGIOGRAM  07/07/2013   Procedure: ABDOMINAL ANGIOGRAM;  Surgeon: Lorretta Harp, MD;  Location: [facility] CATH LAB;  Service: Cardiovascular;;  . BIOPSY  03/07/2018   Procedure: BIOPSY;  Surgeon: Laurence Spates, MD;  Location: [facility] ENDOSCOPY;  Service: Endoscopy;;  . CHOLECYSTECTOMY  07/04/2010  . ERCP N/A 11/27/2012   Procedure: ENDOSCOPIC RETROGRADE CHOLANGIOPANCREATOGRAPHY (ERCP);  Surgeon: Inda Castle, MD;  Location: [facility] ENDOSCOPY;  Service: Endoscopy;  Laterality: N/A;  . ESOPHAGOGASTRODUODENOSCOPY (EGD) WITH PROPOFOL  N/A 03/07/2018   Procedure: ESOPHAGOGASTRODUODENOSCOPY (EGD) WITH PROPOFOL;  Surgeon: Laurence Spates, MD;  Location: [facility];  Service: Endoscopy;  Laterality: N/A;  . EYE SURGERY Right   . IR PARACENTESIS  03/05/2018  . IR PERC TUN PERIT CATH WO PORT S&I Dartha Lodge  03/12/2018  . LEFT HEART CATHETERIZATION WITH CORONARY ANGIOGRAM N/A 07/07/2013   Procedure: LEFT HEART CATHETERIZATION WITH CORONARY ANGIOGRAM;  Surgeon: Lorretta Harp, MD;  Location: [facility] CATH LAB;  Service: Cardiovascular;  Laterality: N/A;  . LOWER EXTREMITY ANGIOGRAM N/A 07/07/2013   Procedure: LOWER EXTREMITY ANGIOGRAM;  Surgeon: Lorretta Harp, MD;  Location: [facility] CATH LAB;  Service: Cardiovascular;  Laterality: N/A;  . NM MYOVIEW LTD  December 2014   Apical thinning but no ischemia. EF 55%  . PROSTATE SURGERY     Dr. Lowella Bandy  . RECTAL EXAM UNDER ANESTHESIA  03/07/2018   Procedure: RECTAL EXAM UNDER ANESTHESIA;  Surgeon: Laurence Spates, MD;  Location: [facility] ENDOSCOPY;  Service: Endoscopy;;  . TRANSTHORACIC ECHOCARDIOGRAM  June 2014   Basal septal thickening with moderate LVH. EF 65%. Normal wall motion. Diastolic dysfunction/NOS. Aortic sclerosis without stenosis. Mild to moderate MR    Social History   Socioeconomic History  . Marital status: Single    Spouse name: Not on file  . Number of children: Not on file  . Years of education: Not on file  . Highest education level: Not on file  Occupational History  . Occupation: Retired    Fish farm manager: RETIRED  Social Needs  . Financial resource strain: Not hard at all  . Food  insecurity:    Worry: Never true    Inability: Never true  . Transportation needs:    Medical: No    Non-medical: No  Tobacco Use  . Smoking status: Former Smoker    Types: Cigarettes    Last attempt to quit: 11/25/1980    Years since quitting: 37.3  . Smokeless tobacco: Never Used  Substance and Sexual Activity  . Alcohol use: Not Currently    Alcohol/week: 4.0 standard drinks    Types: 4  Standard drinks or equivalent per week    Comment: beers  . Drug use: No  . Sexual activity: Not Currently  Lifestyle  . Physical activity:    Days per week: 0 days    Minutes per session: 0 min  . Stress: Not at all  Relationships  . Social connections:    Talks on phone: Once a week    Gets together: Once a week    Attends religious service: Never    Active member of club or organization: No    Attends meetings of clubs or organizations: Never    Relationship status: Never married  . Intimate partner violence:    Fear of current or ex partner: No    Emotionally abused: No    Physically abused: No    Forced sexual activity: No  Other Topics Concern  . Not on file  Social History Narrative   He is originally from [place]. He has a history of long-standing tobacco use but quit several years ago. He also has a history of former alcohol abuse as well.   He is retired and currently single.   Family History  Problem Relation Age of Onset  . Diabetes Sister   . Cancer Sister   . Cancer Mother   . Cancer Father   . Cancer Sister   . Heart disease Brother       VITAL SIGNS BP 118/69   Pulse 71   Temp (!) 97.3 F (36.3 C)   Resp 18   Ht 5\' 5"  (1.651 m)   Wt 154 lb (69.9 kg)   SpO2 98%   BMI 25.63 kg/m   Outpatient Encounter Medications as of 03/28/2018  Medication Sig  . acetaminophen (TYLENOL) 325 MG tablet Take 2 tablets (650 mg total) by mouth every 6 (six) hours as needed for mild pain (or Fever >/= 101).  . bisacodyl (DULCOLAX) 5 MG EC tablet Take 2 tablets (10 mg total) by mouth daily as needed for moderate constipation.  . Cholecalciferol 1000 units tablet Take 1,000 Units by mouth daily.  Marland Kitchen ENSURE (ENSURE) Take 120 mLs by mouth 2 (two) times daily between meals.  . metoprolol tartrate (LOPRESSOR) 25 MG tablet Take 0.5 tablets (12.5 mg total) by mouth 2 (two) times daily.  . mirtazapine (REMERON) 15 MG tablet Take 7.5 mg by mouth at bedtime.  . Morphine  Sulfate (MORPHINE CONCENTRATE) 10 MG/0.5ML SOLN concentrated solution Take 0.25 mLs (5 mg total) by mouth every 2 (two) hours as needed for severe pain.  . NON FORMULARY Diet Type:  Regular texture  . omeprazole (PRILOSEC) 20 MG capsule Give 2 Capsules (40mg ) by mouth daily with breakfast  . ondansetron (ZOFRAN) 4 MG/5ML solution Take 5 mLs (4 mg total) by mouth 4 (four) times daily -  before meals and at bedtime.  . polyethylene glycol (MIRALAX / GLYCOLAX) packet Take 17 g by mouth daily.  . sertraline (ZOLOFT) 50 MG tablet Take 1 tablet (50 mg total) by mouth daily.  No facility-administered encounter medications on file as of 03/28/2018.      SIGNIFICANT DIAGNOSTIC EXAMS  PREVIOUS:   08-09-17: chest x-ray:No active cardiopulmonary disease. Stable mild cardiomegaly.  08-09-17: pelvic x-ray:No definite acute osseous abnormality  08-09-17: ct of head and cervical spine:1. Small left occipital scalp contusion and laceration without underlying skull fracture. 2. Cerebral atrophy with chronic small vessel ischemic disease. Wedge-shaped hypodensities in the posterior parietal lobes left greater than right without hemorrhage. These likely represent age indeterminate watershed infarcts or likely subacute to chronic. Additional small cortical infarcts near the vertex of brain involving the high parietal lobe. 3. Cervical spondylosis without acute cervical spine fracture. 4. Lipoma deep to the left subscapularis muscle measuring at least 5.3 x 2.2 cm on axial series 8, image 79.  08-10-17: MRI of brain:Acute/subacute infarction in the posterior portion of the left MCA territory affecting the posterior temporal lobe and temporoparietal junction region. Few other scattered punctate acute infarctions in the left temporal lobe and left hemispheric deep white matter. Findings most consistent with embolic disease to the left MCA territory. Some component of watershed infarction may be present. Petechial  blood products in the region of infarction with visible thrombosed vessel on the gradient echo imaging. No frank parenchymal hematoma. No mass effect or shift. Old ischemic changes elsewhere throughout the brain as outlined Above.  08-10-17: 2-d echo:- Left ventricle: The cavity size was normal. Wall thickness was increased in a pattern of mild LVH. There was moderate focal basal hypertrophy of the septum. Systolic function was normal.The estimated ejection fraction was in the range of 60% to 65%. Doppler parameters are consistent with abnormal left ventricular relaxation (grade 1 diastolic dysfunction). The E/e&' ratio is between 8-15, suggesting indeterminate LV filling pressure. - Aortic valve: Trileaflet. Sclerosis without stenosis. There was no regurgitation. - Mitral valve: Mildly thickened leaflets . There was trivial regurgitation. - Left atrium: Moderately dilated. - Right ventricle: The cavity size was normal. Wall thickness was normal. Systolic function was normal. Lateral annulus peak S velocity: 14.3 cm/s. - Tricuspid valve: There was mild regurgitation. - Pulmonary arteries: PA peak pressure: 29 mm Hg (S). - Inferior vena cava: The vessel was normal in size. The respirophasic diameter changes were in the normal range (>= 50%), consistent with normal central venous pressure.  08-10-17: carotid doppler:Right Carotid: Velocities in the right ICA are consistent with a 1-39% stenosis. Left Carotid: Velocities in the left ICA are consistent with a 1-39% stenosis.  08-11-17: bilateral BZJ:IRCVE: Resting right ankle-brachial index indicates moderate right lower extremity arterial disease. The right toe-brachial index is abnormal. ABIs are unreliable. Left: Resting left ankle-brachial index indicates moderate left lower extremity arterial disease. The left toe-brachial index is abnormal. ABIs are unreliable.   NO NEW EXAMS   LABS REVIEWED: PREVIOUS   08-09-17: wbc 6.3; hgb 13.7; hct  42.0; mcv 88.4 plt 384; glucose 118; bun 9; creat 0.92; k+ 3.9; na++ 141; ca 9.6; total bili 1.4; albumin 3.7 08-10-17: wbc 5.8; hgb 12.2; hct 38.5; mcv 88.7; plt 345; glucose 99; bun 8; creat 0.88; k+ 3.4; na++ 143; ca 8.5; liver normal albumin 3.1 hgb a1c 5.2; chol 101; ldl 53; trig 74; hdl 33;  tsh 0.903 08-16-17: glucose 81; bun 13.7; creat 0.62; k+ 4.5; na++ 142; ca 9.0   TODAY;   03-20-18: wbc 5.9; hgb 9.0; hct 28; plt 205; glucose 78; bun 16; creat 0.9; k+ 4.3; na++ 144    Review of Systems  Unable to perform ROS: Medical condition (  is not verbally responsive )     Physical Exam  Constitutional: No distress.  Frail   Neck: No thyromegaly present.  Cardiovascular: Normal rate, regular rhythm and intact distal pulses.  Murmur heard. 1/6  Pulmonary/Chest: Effort normal and breath sounds normal. No respiratory distress.  Abdominal: Soft. He exhibits distension. There is no tenderness.  plurex tube in place for paracentesis  Bowel sounds hypoactive   Musculoskeletal: He exhibits no edema.  Lymphadenopathy:    He has no cervical adenopathy.  Neurological:  Not aware   Skin: He is not diaphoretic.    ASSESSMENT/ PLAN:  TODAY:   1. Chronic  ischemic left MCA stroke:  2. Metastatic cancer 3. Vascular dementia without behavioral disturbance  Will continue to focus upon his comfort Will stop medications except for roxanol.  Will continue to monitor his status.  He continues to be followed by hospice care.       MD is aware of resident's narcotic use and is in agreement with current plan of care. We will attempt to wean resident as apropriate   Ok Edwards NP [facility]  Contact [phone] Monday through Friday 8am- 5pm  After hours call [phone]

## 2018-04-05 DEATH — deceased

## 2018-05-16 ENCOUNTER — Ambulatory Visit: Payer: Medicare Other | Admitting: Adult Health

## 2018-05-17 ENCOUNTER — Ambulatory Visit: Payer: Medicare Other | Admitting: Adult Health

## 2019-01-26 IMAGING — MR MR HEAD W/O CM
8 of 10 series · 34 of 48 positions shown · non-contrast
Comparison: CT 08/09/2017

CLINICAL DATA: Syncopal episode with trauma to the back of the
head.

EXAM:
MRI HEAD WITHOUT CONTRAST
TECHNIQUE: Multiplanar, multiecho pulse sequences of the brain and surrounding
structures were obtained without intravenous contrast.

[Series 3: DWI · axial · 3.0mm · 1.09mm/px · z∈[-25,+119]mm · 8 of 98 slices shown (1 of 4)]
[im 1/98]
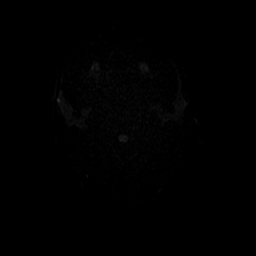
[im 11/98]
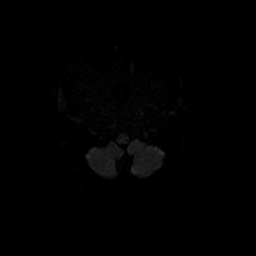
[im 33/98]
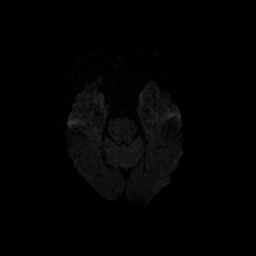
[im 44/98]
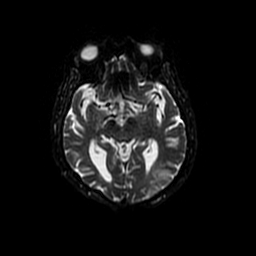
[im 54/98]
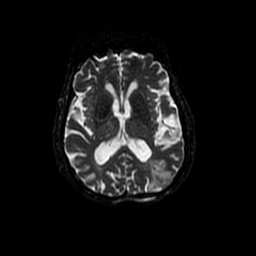
[im 65/98]
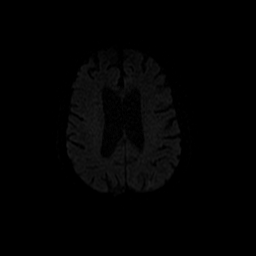
[im 87/98]
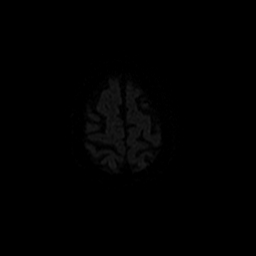
[im 98/98]
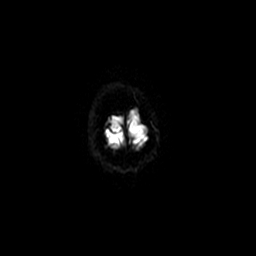

[Series 4: DWI · coronal · 5.0mm · 1.09mm/px · 7 of 64 slices shown (2 of 4)]
[im 1/64]
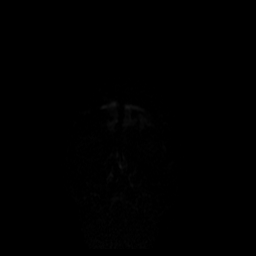
[im 11/64]
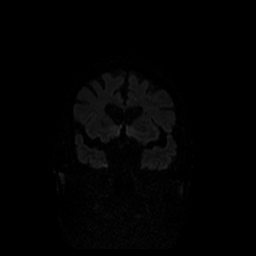
[im 22/64]
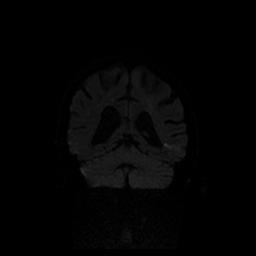
[im 32/64]
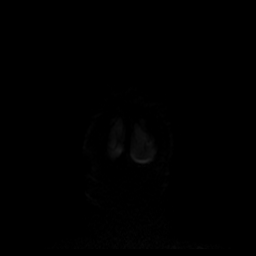
[im 43/64]
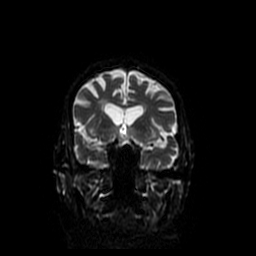
[im 53/64]
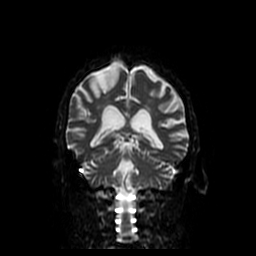
[im 64/64]
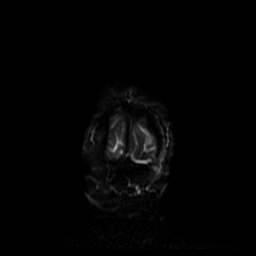

[Series 5: T1 · sagittal · 5.0mm · 0.47mm/px · 2 of 23 slices shown]
[im 1/23]
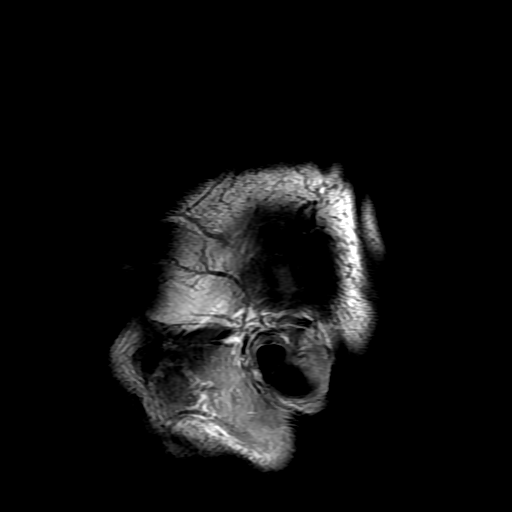
[im 23/23]
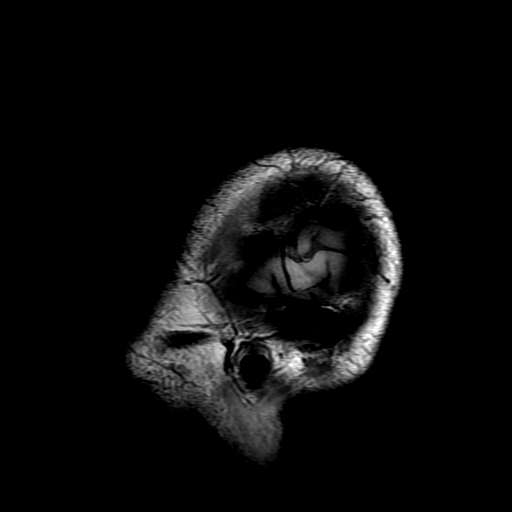

[Series 6: T2 · axial · 5.0mm · 0.43mm/px · z∈[-24,+126]mm · 3 of 26 slices shown (1 of 2)]
[im 1/26]
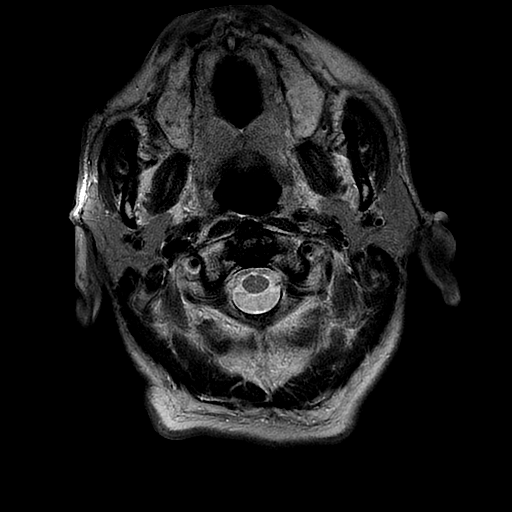
[im 13/26]
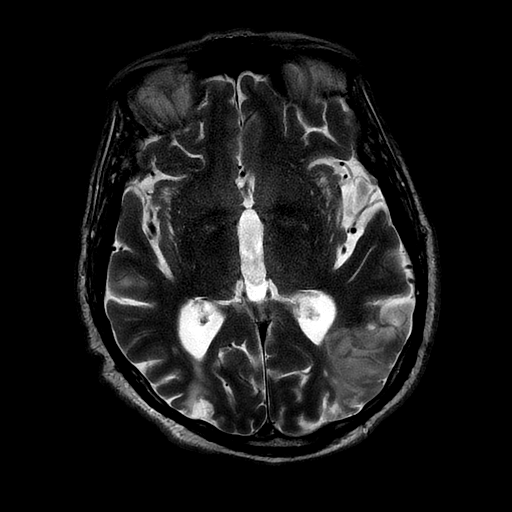
[im 26/26]
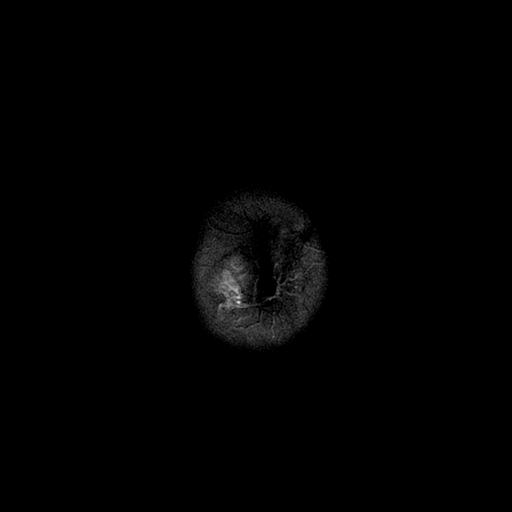

[Series 7: FLAIR · axial · 5.0mm · 0.43mm/px · z∈[-24,+126]mm · 3 of 26 slices shown]
[im 1/26]
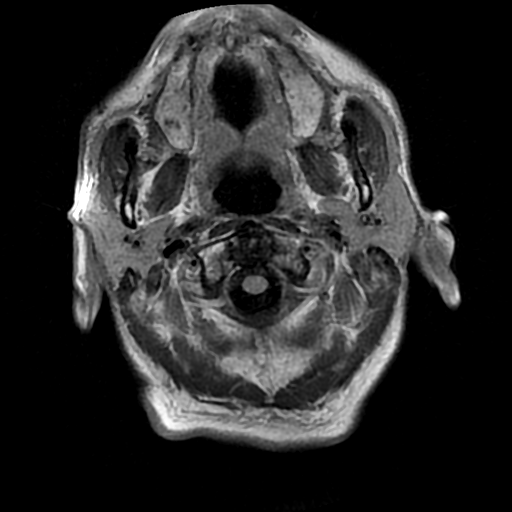
[im 13/26]
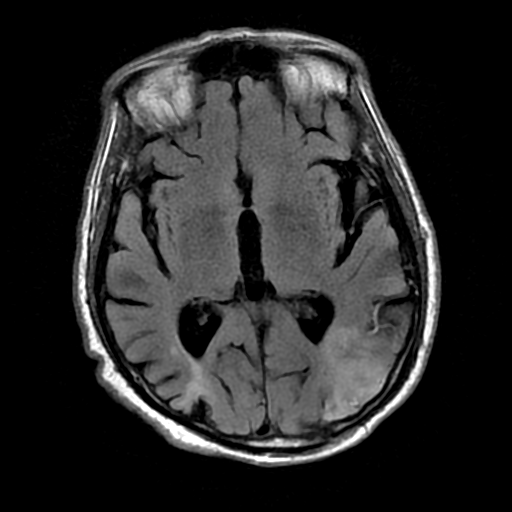
[im 26/26]
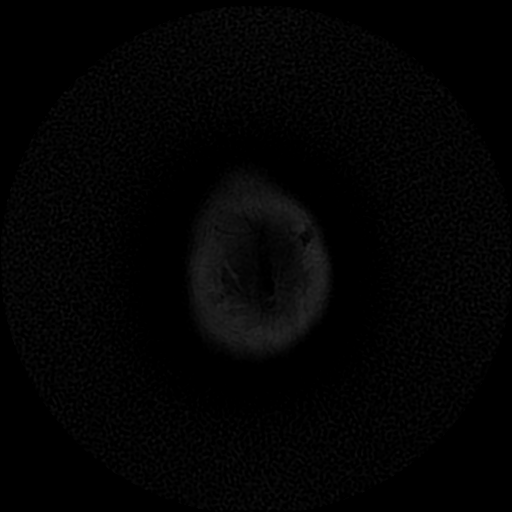

[Series 10: T2 · coronal · 5.0mm · 0.43mm/px · 3 of 27 slices shown (2 of 2)]
[im 1/27]
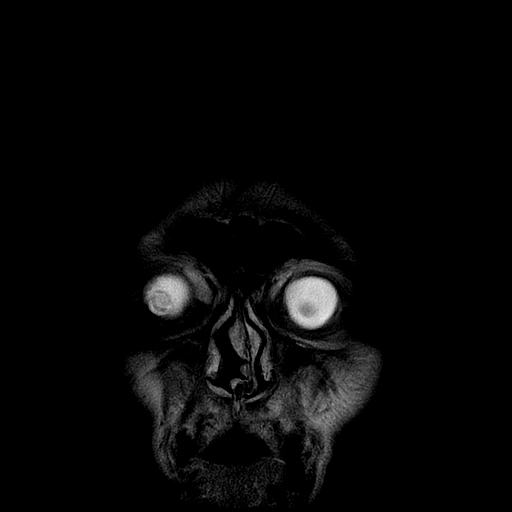
[im 14/27]
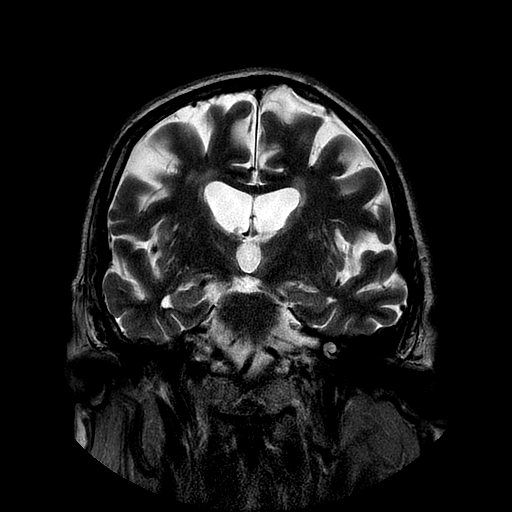
[im 27/27]
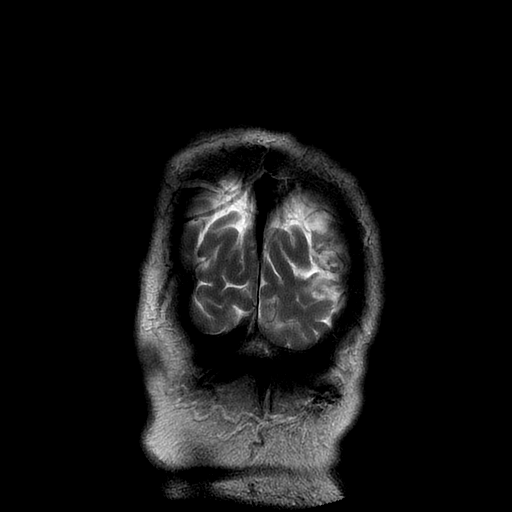

[Series 300: DWI · axial · 3.0mm · 1.09mm/px · z∈[-25,+119]mm · 5 of 49 slices shown (3 of 4)]
[im 1/49]
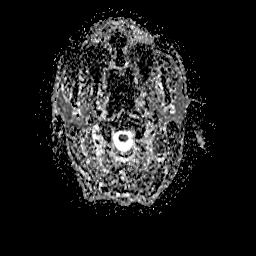
[im 13/49]
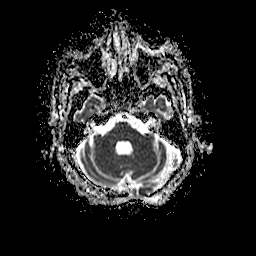
[im 25/49]
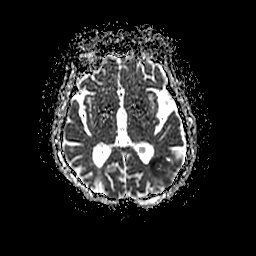
[im 37/49]
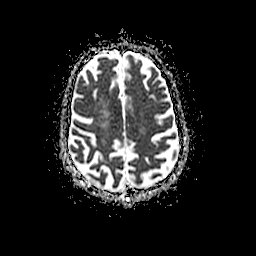
[im 49/49]
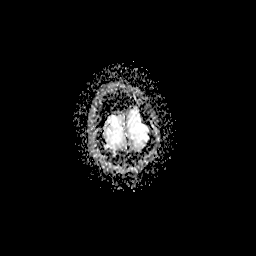

[Series 400: DWI · coronal · 5.0mm · 1.09mm/px · 3 of 32 slices shown (4 of 4)]
[im 1/32]
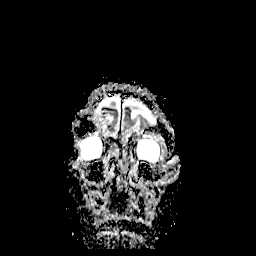
[im 16/32]
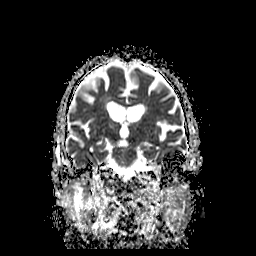
[im 32/32]
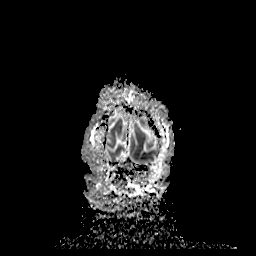

[34 of 48 positions shown; findings below may reference images not displayed]

FINDINGS: Brain: Diffusion imaging confirms acute/subacute infarction in the
left MCA territory affecting the posterior left temporal lobe and
temporoparietal junction region. Few scattered separate infarctions
in the deep white matter of the left hemisphere. No acute infarction
affecting the brainstem, cerebellum or right cerebral hemisphere.
Mild swelling of the affected region. Minimal petechial blood
products in the region of infarction. Susceptibility artifact within
thrombosed vessels but no sign of parenchymal hematoma. There are 2
old small right parietal cortical and subcortical infarctions. There
chronic small-vessel ischemic changes throughout the hemispheric
white matter. No hydrocephalus. No mass lesion. No extra-axial
collection.

Vascular: Major vessels at the base of the brain show flow.

Skull and upper cervical spine: Negative

Sinuses/Orbits: Clear/normal

Other: None
IMPRESSION: Acute/subacute infarction in the posterior portion of the left MCA
territory affecting the posterior temporal lobe and temporoparietal
junction region. Few other scattered punctate acute infarctions in
the left temporal lobe and left hemispheric deep white matter.
Findings most consistent with embolic disease to the left MCA
territory. Some component of watershed infarction may be present.
Petechial blood products in the region of infarction with visible
thrombosed vessel on the gradient echo imaging. No frank parenchymal
hematoma. No mass effect or shift.

Old ischemic changes elsewhere throughout the brain as outlined
above.

## 2019-08-21 IMAGING — US IR PARACENTESIS
1 series · 2 of 2 positions shown · non-contrast
Comparison: none

INDICATION: Patient with history of congestive heart failure, stroke,
malnutrition, acute kidney injury, suggested retroperitoneal
adenopathy on CT, ascites. Request made for diagnostic and
therapeutic paracentesis.

[Series 1: ir (id) (id)/(id)/(id) ir · 2 of 2 slices shown]
[im 1/2]
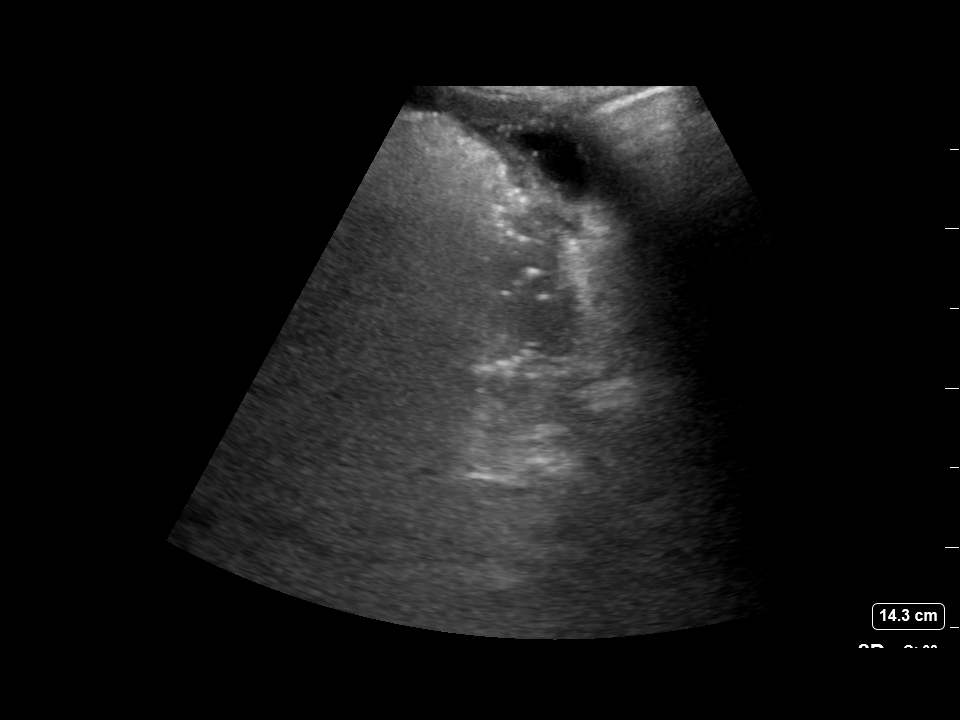
[im 2/2]
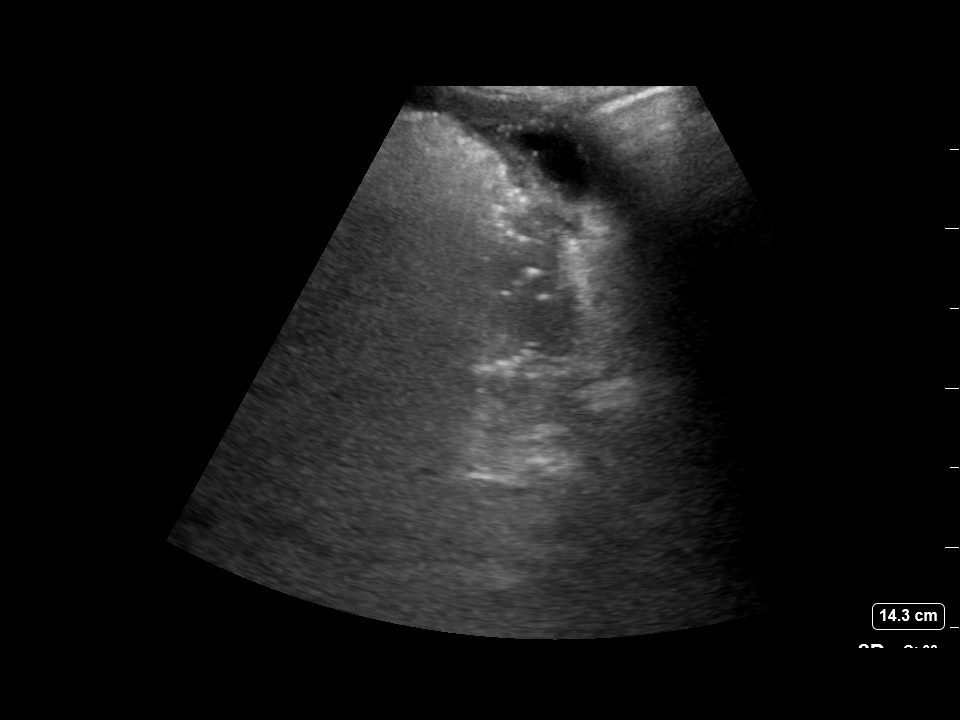

[2 of 2 positions shown; findings below may reference images not displayed]

EXAM:
ULTRASOUND GUIDED DIAGNOSTIC AND THERAPEUTIC PARACENTESIS

MEDICATIONS:
None

COMPLICATIONS:
None immediate.

PROCEDURE:
Informed written consent was obtained from the patient after a
discussion of the risks, benefits and alternatives to treatment. A
timeout was performed prior to the initiation of the procedure.

Initial ultrasound scanning demonstrates a moderate amount of
ascites within the left mid to lower abdominal quadrant. The left
mid to lower abdomen was prepped and draped in the usual sterile
fashion. 2% lidocaine was used for local anesthesia.

Following this, a 19 gauge, 7-cm, Yueh catheter was introduced. An
ultrasound image was saved for documentation purposes. The
paracentesis was performed. The catheter was removed and a dressing
was applied. The patient tolerated the procedure well without
immediate post procedural complication.
FINDINGS: A total of approximately 2.8 liters of yellow fluid was removed.
Samples were sent to the laboratory as requested by the clinical
team.
IMPRESSION: Successful ultrasound-guided diagnostic and therapeutic paracentesis
yielding 2.8 liters of peritoneal fluid.

## 2019-08-28 IMAGING — XA IR PERC TUN PERIT CATH WO PORT S&I /IMAG
1 series · 1 of 1 positions shown · non-contrast
Comparison: none

INDICATION: 88-year-old male with recurrent ascites secondary to gastric
carcinoma

[Series 2: fl (-) angio · 1 of 1 slices shown]
[im 1/1]
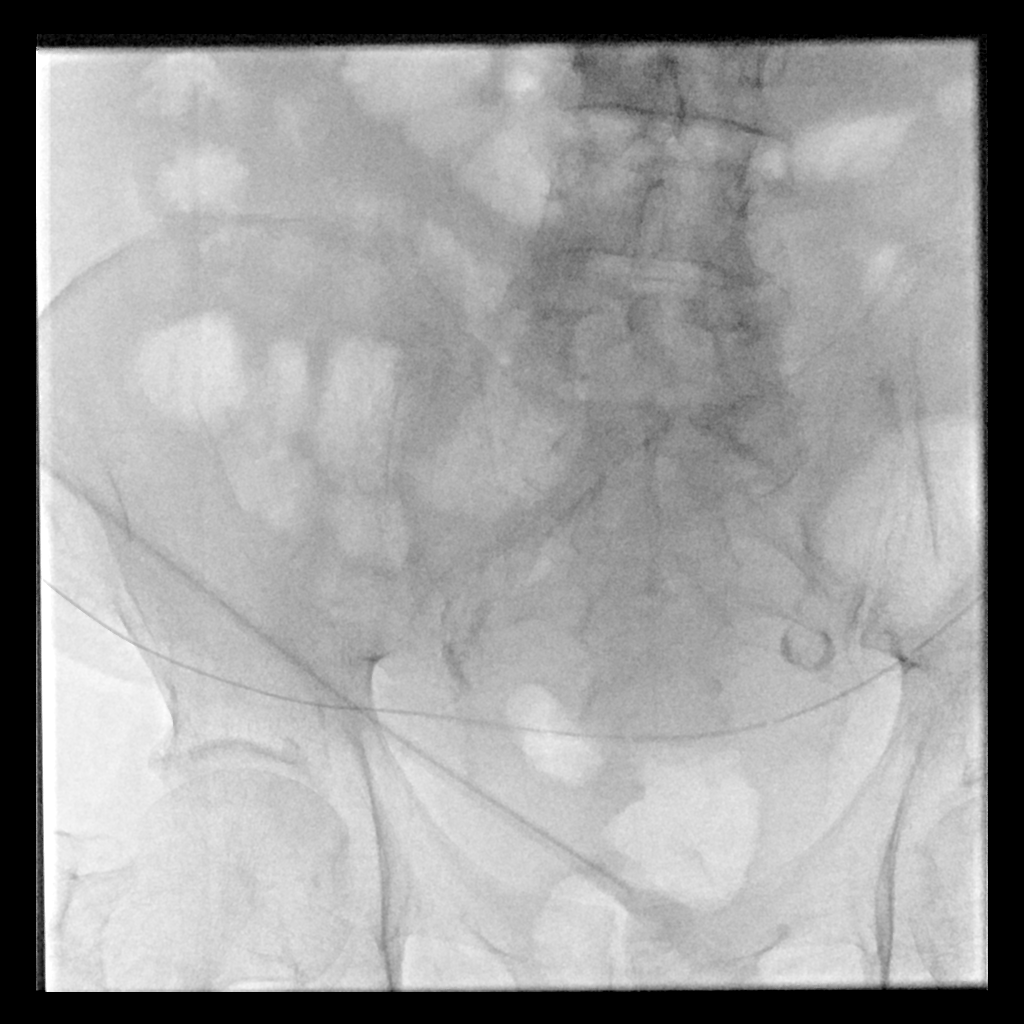

[1 of 1 positions shown; findings below may reference images not displayed]

EXAM:
Image guided placement of tunneled peritoneal catheter

MEDICATIONS:
The patient is currently admitted to the hospital and receiving
intravenous antibiotics. The antibiotics were administered within an
appropriate time frame prior to the initiation of the procedure.

ANESTHESIA/SEDATION:
Fentanyl 1.0 mcg IV; Versed 50 mg IV

Moderate Sedation Time:  15 minutes

The patient was continuously monitored during the procedure by the
interventional radiology nurse under my direct supervision.

COMPLICATIONS:
None

PROCEDURE:
The procedure, risks, benefits, and alternatives were explained to
the patient and the patient's family. Specific risks that were
addressed included bleeding, infection, need for further procedure,
chance of delayed hemorrhage, cardiopulmonary collapse, death.
Questions regarding the procedure were encouraged and answered. The
patient understands and consents to the procedure.

The right abdominal wall was prepped with Betadine in a sterile
fashion, and a sterile drape was applied covering the operative
field. A sterile gown and sterile gloves were used for the
procedure. Local anesthesia was provided with 1% Lidocaine.
Ultrasound image documentation was performed.

After creating a small skin incision, a 19 gauge needle was advanced
into the peritoneal cavity under ultrasound guidance. A guide wire
was then advanced under fluoroscopy into the space. Access was
dilated serially and a 16-French peel-away sheath placed.

The skin and subcutaneous tissues were generously infiltrated with
1% lidocaine from the puncture site along the abdomen anteriorly. A
small stab incision was made with 11 blade scalpel at the insertion
site of the catheter, and the catheter was back tunneled to the site
at the puncture.

A tunneled CareFusion Pleurex catheter was placed. This was tunneled
from the incision 5 cm anterior to the access to the access site.
The catheter was advanced through the peel-away sheath. The sheath
was then removed. Final catheter positioning was confirmed with a
fluoroscopic spot image.

The access incision was closed with Derma bond. Dermabond was
applied to the catheterization incision. Large volume paracentesis
was performed through the new catheter utilizing vacuum containers.

The patient tolerated the procedure well and remained
hemodynamically stable throughout.

No complications were encountered and no significant blood loss was
encountered.
IMPRESSION: Status post peritoneal tunneled catheter placement.
